# Patient Record
Sex: Male | Born: 1970 | Race: White | Hispanic: No | Marital: Married | Smoking: Former smoker
Health system: Southern US, Community
[De-identification: ages and names within clinical notes are randomized; demographics above are authoritative.]

## PROBLEM LIST (undated history)

## (undated) DIAGNOSIS — E669 Obesity, unspecified: Secondary | ICD-10-CM

## (undated) DIAGNOSIS — I10 Essential (primary) hypertension: Secondary | ICD-10-CM

## (undated) DIAGNOSIS — E785 Hyperlipidemia, unspecified: Secondary | ICD-10-CM

## (undated) DIAGNOSIS — T7840XA Allergy, unspecified, initial encounter: Secondary | ICD-10-CM

## (undated) DIAGNOSIS — M109 Gout, unspecified: Secondary | ICD-10-CM

## (undated) DIAGNOSIS — E119 Type 2 diabetes mellitus without complications: Secondary | ICD-10-CM

## (undated) DIAGNOSIS — Z9989 Dependence on other enabling machines and devices: Secondary | ICD-10-CM

## (undated) DIAGNOSIS — E559 Vitamin D deficiency, unspecified: Secondary | ICD-10-CM

## (undated) DIAGNOSIS — E291 Testicular hypofunction: Secondary | ICD-10-CM

## (undated) DIAGNOSIS — R824 Acetonuria: Secondary | ICD-10-CM

## (undated) DIAGNOSIS — G4733 Obstructive sleep apnea (adult) (pediatric): Secondary | ICD-10-CM

## (undated) DIAGNOSIS — L219 Seborrheic dermatitis, unspecified: Secondary | ICD-10-CM

## (undated) DIAGNOSIS — C6290 Malignant neoplasm of unspecified testis, unspecified whether descended or undescended: Secondary | ICD-10-CM

## (undated) DIAGNOSIS — E039 Hypothyroidism, unspecified: Secondary | ICD-10-CM

## (undated) DIAGNOSIS — IMO0002 Reserved for concepts with insufficient information to code with codable children: Secondary | ICD-10-CM

## (undated) DIAGNOSIS — E8881 Metabolic syndrome: Secondary | ICD-10-CM

## (undated) DIAGNOSIS — K449 Diaphragmatic hernia without obstruction or gangrene: Secondary | ICD-10-CM

## (undated) DIAGNOSIS — M5410 Radiculopathy, site unspecified: Secondary | ICD-10-CM

## (undated) DIAGNOSIS — F32A Depression, unspecified: Secondary | ICD-10-CM

## (undated) DIAGNOSIS — F419 Anxiety disorder, unspecified: Secondary | ICD-10-CM

## (undated) DIAGNOSIS — M1990 Unspecified osteoarthritis, unspecified site: Secondary | ICD-10-CM

## (undated) DIAGNOSIS — C801 Malignant (primary) neoplasm, unspecified: Secondary | ICD-10-CM

## (undated) HISTORY — DX: Vitamin D deficiency, unspecified: E55.9

## (undated) HISTORY — DX: Acetonuria: R82.4

## (undated) HISTORY — DX: Obesity, unspecified: E66.9

## (undated) HISTORY — DX: Hypothyroidism, unspecified: E03.9

## (undated) HISTORY — DX: Metabolic syndrome: E88.81

## (undated) HISTORY — DX: Dependence on other enabling machines and devices: Z99.89

## (undated) HISTORY — PX: BACK SURGERY: SHX140

## (undated) HISTORY — DX: Metabolic syndrome: E88.810

## (undated) HISTORY — DX: Radiculopathy, site unspecified: M54.10

## (undated) HISTORY — DX: Diaphragmatic hernia without obstruction or gangrene: K44.9

## (undated) HISTORY — DX: Gout, unspecified: M10.9

## (undated) HISTORY — DX: Obstructive sleep apnea (adult) (pediatric): G47.33

## (undated) HISTORY — PX: HERNIA REPAIR: SHX51

## (undated) HISTORY — DX: Reserved for concepts with insufficient information to code with codable children: IMO0002

## (undated) HISTORY — PX: SPINE SURGERY: SHX786

## (undated) HISTORY — DX: Seborrheic dermatitis, unspecified: L21.9

## (undated) HISTORY — DX: Testicular hypofunction: E29.1

## (undated) HISTORY — DX: Malignant (primary) neoplasm, unspecified: C80.1

## (undated) HISTORY — DX: Unspecified osteoarthritis, unspecified site: M19.90

## (undated) HISTORY — DX: Allergy, unspecified, initial encounter: T78.40XA

---

## 1998-12-23 HISTORY — PX: URETEROTOMY: SHX496

## 2002-09-27 ENCOUNTER — Encounter: Admission: RE | Admit: 2002-09-27 | Discharge: 2002-09-27 | Payer: Self-pay | Admitting: Neurosurgery

## 2002-09-27 ENCOUNTER — Encounter: Payer: Self-pay | Admitting: Neurosurgery

## 2006-03-05 ENCOUNTER — Ambulatory Visit: Payer: Self-pay | Admitting: Family Medicine

## 2007-03-15 ENCOUNTER — Emergency Department: Payer: Self-pay | Admitting: Emergency Medicine

## 2007-08-27 ENCOUNTER — Emergency Department: Payer: Self-pay | Admitting: Emergency Medicine

## 2007-08-27 ENCOUNTER — Other Ambulatory Visit: Payer: Self-pay

## 2007-09-01 ENCOUNTER — Emergency Department: Payer: Self-pay | Admitting: Emergency Medicine

## 2009-02-09 ENCOUNTER — Emergency Department: Payer: Self-pay | Admitting: Emergency Medicine

## 2009-07-04 ENCOUNTER — Emergency Department (HOSPITAL_COMMUNITY): Admission: EM | Admit: 2009-07-04 | Discharge: 2009-07-04 | Payer: Self-pay | Admitting: Emergency Medicine

## 2009-09-18 ENCOUNTER — Ambulatory Visit: Payer: Self-pay | Admitting: Pain Medicine

## 2009-12-25 ENCOUNTER — Observation Stay (HOSPITAL_COMMUNITY): Admission: EM | Admit: 2009-12-25 | Discharge: 2009-12-26 | Payer: Self-pay | Admitting: Emergency Medicine

## 2010-07-23 ENCOUNTER — Ambulatory Visit: Payer: Self-pay | Admitting: Family Medicine

## 2011-03-10 LAB — GLUCOSE, CAPILLARY
Glucose-Capillary: 276 mg/dL — ABNORMAL HIGH (ref 70–99)
Glucose-Capillary: 321 mg/dL — ABNORMAL HIGH (ref 70–99)
Glucose-Capillary: 343 mg/dL — ABNORMAL HIGH (ref 70–99)
Glucose-Capillary: 363 mg/dL — ABNORMAL HIGH (ref 70–99)
Glucose-Capillary: 386 mg/dL — ABNORMAL HIGH (ref 70–99)
Glucose-Capillary: 407 mg/dL — ABNORMAL HIGH (ref 70–99)
Glucose-Capillary: 560 mg/dL (ref 70–99)

## 2011-03-10 LAB — BASIC METABOLIC PANEL
BUN: 25 mg/dL — ABNORMAL HIGH (ref 6–23)
CO2: 26 mEq/L (ref 19–32)
Calcium: 9.8 mg/dL (ref 8.4–10.5)
Chloride: 96 mEq/L (ref 96–112)
Creatinine, Ser: 2.01 mg/dL — ABNORMAL HIGH (ref 0.4–1.5)
GFR calc Af Amer: 45 mL/min — ABNORMAL LOW (ref >60)
GFR calc non Af Amer: 38 mL/min — ABNORMAL LOW (ref >60)
Glucose, Bld: 590 mg/dL (ref 70–99)
Potassium: 4.8 mEq/L (ref 3.5–5.1)
Sodium: 133 mEq/L — ABNORMAL LOW (ref 135–145)

## 2011-03-10 LAB — DIFFERENTIAL
Basophils Absolute: 0.1 10*3/uL (ref 0.0–0.1)
Basophils Relative: 1 % (ref 0–1)
Eosinophils Absolute: 0.2 10*3/uL (ref 0.0–0.7)
Eosinophils Relative: 2 % (ref 0–5)
Lymphocytes Relative: 12 % (ref 12–46)
Lymphs Abs: 1.1 10*3/uL (ref 0.7–4.0)
Monocytes Absolute: 0.6 10*3/uL (ref 0.1–1.0)
Monocytes Relative: 7 % (ref 3–12)
Neutro Abs: 6.7 10*3/uL (ref 1.7–7.7)
Neutrophils Relative %: 77 % (ref 43–77)

## 2011-03-10 LAB — CBC
HCT: 41.5 % (ref 39.0–52.0)
Hemoglobin: 14.3 g/dL (ref 13.0–17.0)
MCHC: 34.4 g/dL (ref 30.0–36.0)
MCV: 88.4 fL (ref 78.0–100.0)
Platelets: 205 10*3/uL (ref 150–400)
RBC: 4.69 MIL/uL (ref 4.22–5.81)
RDW: 13 % (ref 11.5–15.5)
WBC: 8.7 10*3/uL (ref 4.0–10.5)

## 2011-03-10 LAB — URINALYSIS, ROUTINE W REFLEX MICROSCOPIC
Bilirubin Urine: NEGATIVE
Glucose, UA: >1000 mg/dL — AB
Hgb urine dipstick: NEGATIVE
Ketones, ur: NEGATIVE mg/dL
Leukocytes, UA: NEGATIVE
Nitrite: NEGATIVE
Protein, ur: NEGATIVE mg/dL
Specific Gravity, Urine: 1.03 (ref 1.005–1.030)
Urobilinogen, UA: 0.2 mg/dL (ref 0.0–1.0)
pH: 5 (ref 5.0–8.0)

## 2011-03-10 LAB — URINE MICROSCOPIC-ADD ON: Urine-Other: NONE SEEN

## 2011-12-26 DIAGNOSIS — E785 Hyperlipidemia, unspecified: Secondary | ICD-10-CM | POA: Diagnosis not present

## 2011-12-26 DIAGNOSIS — I10 Essential (primary) hypertension: Secondary | ICD-10-CM | POA: Diagnosis not present

## 2011-12-26 DIAGNOSIS — E039 Hypothyroidism, unspecified: Secondary | ICD-10-CM | POA: Diagnosis not present

## 2011-12-26 DIAGNOSIS — E559 Vitamin D deficiency, unspecified: Secondary | ICD-10-CM | POA: Diagnosis not present

## 2012-01-02 DIAGNOSIS — E119 Type 2 diabetes mellitus without complications: Secondary | ICD-10-CM | POA: Diagnosis not present

## 2012-01-02 DIAGNOSIS — G4733 Obstructive sleep apnea (adult) (pediatric): Secondary | ICD-10-CM | POA: Diagnosis not present

## 2012-01-02 DIAGNOSIS — IMO0001 Reserved for inherently not codable concepts without codable children: Secondary | ICD-10-CM | POA: Diagnosis not present

## 2012-01-02 DIAGNOSIS — G4730 Sleep apnea, unspecified: Secondary | ICD-10-CM | POA: Diagnosis not present

## 2012-01-02 DIAGNOSIS — E7800 Pure hypercholesterolemia, unspecified: Secondary | ICD-10-CM | POA: Diagnosis not present

## 2012-01-02 DIAGNOSIS — E669 Obesity, unspecified: Secondary | ICD-10-CM | POA: Diagnosis not present

## 2012-01-02 DIAGNOSIS — F54 Psychological and behavioral factors associated with disorders or diseases classified elsewhere: Secondary | ICD-10-CM | POA: Diagnosis not present

## 2012-01-02 DIAGNOSIS — Z79899 Other long term (current) drug therapy: Secondary | ICD-10-CM | POA: Diagnosis not present

## 2012-01-02 DIAGNOSIS — I10 Essential (primary) hypertension: Secondary | ICD-10-CM | POA: Diagnosis not present

## 2012-01-02 DIAGNOSIS — M159 Polyosteoarthritis, unspecified: Secondary | ICD-10-CM | POA: Diagnosis not present

## 2012-01-03 DIAGNOSIS — Z79899 Other long term (current) drug therapy: Secondary | ICD-10-CM | POA: Diagnosis not present

## 2012-01-03 DIAGNOSIS — G4733 Obstructive sleep apnea (adult) (pediatric): Secondary | ICD-10-CM | POA: Diagnosis not present

## 2012-01-03 DIAGNOSIS — E063 Autoimmune thyroiditis: Secondary | ICD-10-CM | POA: Diagnosis not present

## 2012-01-03 DIAGNOSIS — E669 Obesity, unspecified: Secondary | ICD-10-CM | POA: Diagnosis not present

## 2012-01-03 DIAGNOSIS — I10 Essential (primary) hypertension: Secondary | ICD-10-CM | POA: Diagnosis not present

## 2012-01-03 DIAGNOSIS — E119 Type 2 diabetes mellitus without complications: Secondary | ICD-10-CM | POA: Diagnosis not present

## 2012-01-03 DIAGNOSIS — IMO0001 Reserved for inherently not codable concepts without codable children: Secondary | ICD-10-CM | POA: Diagnosis not present

## 2012-01-03 DIAGNOSIS — E785 Hyperlipidemia, unspecified: Secondary | ICD-10-CM | POA: Diagnosis not present

## 2012-01-03 DIAGNOSIS — E7800 Pure hypercholesterolemia, unspecified: Secondary | ICD-10-CM | POA: Diagnosis not present

## 2012-01-10 DIAGNOSIS — Z9884 Bariatric surgery status: Secondary | ICD-10-CM | POA: Diagnosis not present

## 2012-01-10 DIAGNOSIS — Z713 Dietary counseling and surveillance: Secondary | ICD-10-CM | POA: Diagnosis not present

## 2012-01-10 DIAGNOSIS — K219 Gastro-esophageal reflux disease without esophagitis: Secondary | ICD-10-CM | POA: Diagnosis not present

## 2012-01-10 DIAGNOSIS — Z6841 Body Mass Index (BMI) 40.0 and over, adult: Secondary | ICD-10-CM | POA: Diagnosis not present

## 2012-01-10 DIAGNOSIS — I10 Essential (primary) hypertension: Secondary | ICD-10-CM | POA: Diagnosis not present

## 2012-01-10 DIAGNOSIS — E119 Type 2 diabetes mellitus without complications: Secondary | ICD-10-CM | POA: Diagnosis not present

## 2012-01-10 DIAGNOSIS — E669 Obesity, unspecified: Secondary | ICD-10-CM | POA: Diagnosis not present

## 2012-01-10 DIAGNOSIS — E785 Hyperlipidemia, unspecified: Secondary | ICD-10-CM | POA: Diagnosis not present

## 2012-01-10 DIAGNOSIS — E039 Hypothyroidism, unspecified: Secondary | ICD-10-CM | POA: Diagnosis not present

## 2012-01-10 DIAGNOSIS — M1990 Unspecified osteoarthritis, unspecified site: Secondary | ICD-10-CM | POA: Diagnosis not present

## 2012-01-20 DIAGNOSIS — E039 Hypothyroidism, unspecified: Secondary | ICD-10-CM | POA: Diagnosis not present

## 2012-01-20 DIAGNOSIS — E1165 Type 2 diabetes mellitus with hyperglycemia: Secondary | ICD-10-CM | POA: Diagnosis not present

## 2012-01-20 DIAGNOSIS — E669 Obesity, unspecified: Secondary | ICD-10-CM | POA: Diagnosis not present

## 2012-01-20 DIAGNOSIS — E1129 Type 2 diabetes mellitus with other diabetic kidney complication: Secondary | ICD-10-CM | POA: Diagnosis not present

## 2012-02-11 DIAGNOSIS — E119 Type 2 diabetes mellitus without complications: Secondary | ICD-10-CM | POA: Diagnosis not present

## 2012-02-11 DIAGNOSIS — Z01818 Encounter for other preprocedural examination: Secondary | ICD-10-CM | POA: Diagnosis not present

## 2012-02-11 DIAGNOSIS — G4730 Sleep apnea, unspecified: Secondary | ICD-10-CM | POA: Diagnosis not present

## 2012-02-11 DIAGNOSIS — I10 Essential (primary) hypertension: Secondary | ICD-10-CM | POA: Diagnosis not present

## 2012-02-11 DIAGNOSIS — E785 Hyperlipidemia, unspecified: Secondary | ICD-10-CM | POA: Diagnosis not present

## 2012-02-11 DIAGNOSIS — I119 Hypertensive heart disease without heart failure: Secondary | ICD-10-CM | POA: Diagnosis not present

## 2012-02-11 DIAGNOSIS — R918 Other nonspecific abnormal finding of lung field: Secondary | ICD-10-CM | POA: Diagnosis not present

## 2012-02-11 DIAGNOSIS — Z01810 Encounter for preprocedural cardiovascular examination: Secondary | ICD-10-CM | POA: Diagnosis not present

## 2012-02-13 DIAGNOSIS — E119 Type 2 diabetes mellitus without complications: Secondary | ICD-10-CM | POA: Diagnosis not present

## 2012-02-13 DIAGNOSIS — E1129 Type 2 diabetes mellitus with other diabetic kidney complication: Secondary | ICD-10-CM | POA: Diagnosis not present

## 2012-02-13 DIAGNOSIS — R7989 Other specified abnormal findings of blood chemistry: Secondary | ICD-10-CM | POA: Diagnosis not present

## 2012-02-13 DIAGNOSIS — Z79899 Other long term (current) drug therapy: Secondary | ICD-10-CM | POA: Diagnosis not present

## 2012-02-13 DIAGNOSIS — E669 Obesity, unspecified: Secondary | ICD-10-CM | POA: Diagnosis not present

## 2012-02-13 DIAGNOSIS — I10 Essential (primary) hypertension: Secondary | ICD-10-CM | POA: Diagnosis not present

## 2012-02-20 DIAGNOSIS — E119 Type 2 diabetes mellitus without complications: Secondary | ICD-10-CM | POA: Diagnosis present

## 2012-02-20 DIAGNOSIS — Z6841 Body Mass Index (BMI) 40.0 and over, adult: Secondary | ICD-10-CM | POA: Diagnosis not present

## 2012-02-20 DIAGNOSIS — G4733 Obstructive sleep apnea (adult) (pediatric): Secondary | ICD-10-CM | POA: Diagnosis not present

## 2012-02-20 DIAGNOSIS — I10 Essential (primary) hypertension: Secondary | ICD-10-CM | POA: Diagnosis not present

## 2012-02-20 DIAGNOSIS — E039 Hypothyroidism, unspecified: Secondary | ICD-10-CM | POA: Diagnosis present

## 2012-02-20 DIAGNOSIS — Z9884 Bariatric surgery status: Secondary | ICD-10-CM | POA: Insufficient documentation

## 2012-02-20 HISTORY — PX: ROUX-EN-Y GASTRIC BYPASS: SHX1104

## 2012-03-19 DIAGNOSIS — E1129 Type 2 diabetes mellitus with other diabetic kidney complication: Secondary | ICD-10-CM | POA: Diagnosis not present

## 2012-03-19 DIAGNOSIS — I10 Essential (primary) hypertension: Secondary | ICD-10-CM | POA: Diagnosis not present

## 2012-03-19 DIAGNOSIS — E1165 Type 2 diabetes mellitus with hyperglycemia: Secondary | ICD-10-CM | POA: Diagnosis not present

## 2012-03-19 DIAGNOSIS — M545 Low back pain, unspecified: Secondary | ICD-10-CM | POA: Diagnosis not present

## 2012-03-19 DIAGNOSIS — E039 Hypothyroidism, unspecified: Secondary | ICD-10-CM | POA: Diagnosis not present

## 2012-03-31 DIAGNOSIS — E039 Hypothyroidism, unspecified: Secondary | ICD-10-CM | POA: Diagnosis not present

## 2012-03-31 DIAGNOSIS — E1165 Type 2 diabetes mellitus with hyperglycemia: Secondary | ICD-10-CM | POA: Diagnosis not present

## 2012-03-31 DIAGNOSIS — E1129 Type 2 diabetes mellitus with other diabetic kidney complication: Secondary | ICD-10-CM | POA: Diagnosis not present

## 2012-05-25 DIAGNOSIS — Z9884 Bariatric surgery status: Secondary | ICD-10-CM | POA: Diagnosis not present

## 2012-05-25 DIAGNOSIS — Z6841 Body Mass Index (BMI) 40.0 and over, adult: Secondary | ICD-10-CM | POA: Diagnosis not present

## 2012-05-25 DIAGNOSIS — Z09 Encounter for follow-up examination after completed treatment for conditions other than malignant neoplasm: Secondary | ICD-10-CM | POA: Diagnosis not present

## 2012-05-25 DIAGNOSIS — Z713 Dietary counseling and surveillance: Secondary | ICD-10-CM | POA: Diagnosis not present

## 2012-07-17 DIAGNOSIS — N183 Chronic kidney disease, stage 3 unspecified: Secondary | ICD-10-CM | POA: Diagnosis not present

## 2012-07-17 DIAGNOSIS — E039 Hypothyroidism, unspecified: Secondary | ICD-10-CM | POA: Diagnosis not present

## 2012-07-17 DIAGNOSIS — E119 Type 2 diabetes mellitus without complications: Secondary | ICD-10-CM | POA: Diagnosis not present

## 2012-07-17 DIAGNOSIS — I10 Essential (primary) hypertension: Secondary | ICD-10-CM | POA: Diagnosis not present

## 2012-09-16 DIAGNOSIS — N183 Chronic kidney disease, stage 3 unspecified: Secondary | ICD-10-CM | POA: Diagnosis not present

## 2012-09-16 DIAGNOSIS — Z8639 Personal history of other endocrine, nutritional and metabolic disease: Secondary | ICD-10-CM | POA: Diagnosis not present

## 2012-09-16 DIAGNOSIS — Z23 Encounter for immunization: Secondary | ICD-10-CM | POA: Diagnosis not present

## 2012-09-16 DIAGNOSIS — E039 Hypothyroidism, unspecified: Secondary | ICD-10-CM | POA: Diagnosis not present

## 2012-09-16 DIAGNOSIS — Z862 Personal history of diseases of the blood and blood-forming organs and certain disorders involving the immune mechanism: Secondary | ICD-10-CM | POA: Diagnosis not present

## 2012-09-16 DIAGNOSIS — E559 Vitamin D deficiency, unspecified: Secondary | ICD-10-CM | POA: Diagnosis not present

## 2012-10-05 DIAGNOSIS — Z9884 Bariatric surgery status: Secondary | ICD-10-CM | POA: Diagnosis not present

## 2013-01-20 DIAGNOSIS — N183 Chronic kidney disease, stage 3 unspecified: Secondary | ICD-10-CM | POA: Diagnosis not present

## 2013-01-20 DIAGNOSIS — I10 Essential (primary) hypertension: Secondary | ICD-10-CM | POA: Diagnosis not present

## 2013-01-20 DIAGNOSIS — E039 Hypothyroidism, unspecified: Secondary | ICD-10-CM | POA: Diagnosis not present

## 2013-01-20 DIAGNOSIS — E785 Hyperlipidemia, unspecified: Secondary | ICD-10-CM | POA: Diagnosis not present

## 2013-01-20 DIAGNOSIS — IMO0002 Reserved for concepts with insufficient information to code with codable children: Secondary | ICD-10-CM | POA: Diagnosis not present

## 2013-02-10 ENCOUNTER — Ambulatory Visit: Payer: Self-pay | Admitting: Family Medicine

## 2013-02-10 DIAGNOSIS — G4733 Obstructive sleep apnea (adult) (pediatric): Secondary | ICD-10-CM | POA: Diagnosis not present

## 2013-03-30 DIAGNOSIS — E039 Hypothyroidism, unspecified: Secondary | ICD-10-CM | POA: Diagnosis not present

## 2013-03-30 DIAGNOSIS — L551 Sunburn of second degree: Secondary | ICD-10-CM | POA: Diagnosis not present

## 2013-04-20 DIAGNOSIS — M545 Low back pain, unspecified: Secondary | ICD-10-CM | POA: Diagnosis not present

## 2013-04-20 DIAGNOSIS — I10 Essential (primary) hypertension: Secondary | ICD-10-CM | POA: Diagnosis not present

## 2013-04-20 DIAGNOSIS — E039 Hypothyroidism, unspecified: Secondary | ICD-10-CM | POA: Diagnosis not present

## 2013-05-25 DIAGNOSIS — H5500 Unspecified nystagmus: Secondary | ICD-10-CM | POA: Diagnosis not present

## 2013-07-22 DIAGNOSIS — H6980 Other specified disorders of Eustachian tube, unspecified ear: Secondary | ICD-10-CM | POA: Diagnosis not present

## 2013-07-22 DIAGNOSIS — Z9884 Bariatric surgery status: Secondary | ICD-10-CM | POA: Diagnosis not present

## 2013-07-22 DIAGNOSIS — IMO0002 Reserved for concepts with insufficient information to code with codable children: Secondary | ICD-10-CM | POA: Diagnosis not present

## 2013-07-22 DIAGNOSIS — E039 Hypothyroidism, unspecified: Secondary | ICD-10-CM | POA: Diagnosis not present

## 2013-09-04 ENCOUNTER — Emergency Department: Payer: Self-pay | Admitting: Emergency Medicine

## 2013-09-04 DIAGNOSIS — Z79899 Other long term (current) drug therapy: Secondary | ICD-10-CM | POA: Diagnosis not present

## 2013-09-04 DIAGNOSIS — M79609 Pain in unspecified limb: Secondary | ICD-10-CM | POA: Diagnosis not present

## 2013-09-04 DIAGNOSIS — Z888 Allergy status to other drugs, medicaments and biological substances status: Secondary | ICD-10-CM | POA: Diagnosis not present

## 2013-09-04 DIAGNOSIS — E119 Type 2 diabetes mellitus without complications: Secondary | ICD-10-CM | POA: Diagnosis not present

## 2013-09-04 DIAGNOSIS — I10 Essential (primary) hypertension: Secondary | ICD-10-CM | POA: Diagnosis not present

## 2013-09-06 DIAGNOSIS — M79609 Pain in unspecified limb: Secondary | ICD-10-CM | POA: Diagnosis not present

## 2013-12-17 DIAGNOSIS — Z9884 Bariatric surgery status: Secondary | ICD-10-CM | POA: Diagnosis not present

## 2013-12-17 DIAGNOSIS — Z13220 Encounter for screening for lipoid disorders: Secondary | ICD-10-CM | POA: Diagnosis not present

## 2013-12-17 DIAGNOSIS — E559 Vitamin D deficiency, unspecified: Secondary | ICD-10-CM | POA: Diagnosis not present

## 2013-12-17 DIAGNOSIS — E039 Hypothyroidism, unspecified: Secondary | ICD-10-CM | POA: Diagnosis not present

## 2014-01-04 DIAGNOSIS — Z9884 Bariatric surgery status: Secondary | ICD-10-CM | POA: Diagnosis not present

## 2014-01-04 DIAGNOSIS — Z862 Personal history of diseases of the blood and blood-forming organs and certain disorders involving the immune mechanism: Secondary | ICD-10-CM | POA: Diagnosis not present

## 2014-01-04 DIAGNOSIS — Z13220 Encounter for screening for lipoid disorders: Secondary | ICD-10-CM | POA: Diagnosis not present

## 2014-01-04 DIAGNOSIS — Z79899 Other long term (current) drug therapy: Secondary | ICD-10-CM | POA: Diagnosis not present

## 2014-01-04 DIAGNOSIS — E559 Vitamin D deficiency, unspecified: Secondary | ICD-10-CM | POA: Diagnosis not present

## 2014-01-04 DIAGNOSIS — Z789 Other specified health status: Secondary | ICD-10-CM | POA: Diagnosis not present

## 2014-01-04 DIAGNOSIS — E039 Hypothyroidism, unspecified: Secondary | ICD-10-CM | POA: Diagnosis not present

## 2014-03-16 DIAGNOSIS — E039 Hypothyroidism, unspecified: Secondary | ICD-10-CM | POA: Diagnosis not present

## 2014-03-16 DIAGNOSIS — G4730 Sleep apnea, unspecified: Secondary | ICD-10-CM | POA: Diagnosis not present

## 2014-03-16 DIAGNOSIS — G8929 Other chronic pain: Secondary | ICD-10-CM | POA: Diagnosis not present

## 2014-03-16 DIAGNOSIS — Z9884 Bariatric surgery status: Secondary | ICD-10-CM | POA: Diagnosis not present

## 2014-07-11 DIAGNOSIS — T148 Other injury of unspecified body region: Secondary | ICD-10-CM | POA: Diagnosis not present

## 2014-07-11 DIAGNOSIS — A689 Relapsing fever, unspecified: Secondary | ICD-10-CM | POA: Diagnosis not present

## 2014-07-11 DIAGNOSIS — Z111 Encounter for screening for respiratory tuberculosis: Secondary | ICD-10-CM | POA: Diagnosis not present

## 2014-07-11 DIAGNOSIS — R21 Rash and other nonspecific skin eruption: Secondary | ICD-10-CM | POA: Diagnosis not present

## 2014-07-26 ENCOUNTER — Emergency Department: Payer: Self-pay | Admitting: Emergency Medicine

## 2014-07-26 DIAGNOSIS — F17200 Nicotine dependence, unspecified, uncomplicated: Secondary | ICD-10-CM | POA: Diagnosis not present

## 2014-07-26 DIAGNOSIS — S93409A Sprain of unspecified ligament of unspecified ankle, initial encounter: Secondary | ICD-10-CM | POA: Diagnosis not present

## 2014-07-26 DIAGNOSIS — S8990XA Unspecified injury of unspecified lower leg, initial encounter: Secondary | ICD-10-CM | POA: Diagnosis not present

## 2014-07-26 DIAGNOSIS — M109 Gout, unspecified: Secondary | ICD-10-CM | POA: Diagnosis not present

## 2014-07-26 DIAGNOSIS — I10 Essential (primary) hypertension: Secondary | ICD-10-CM | POA: Diagnosis not present

## 2014-07-26 DIAGNOSIS — S99919A Unspecified injury of unspecified ankle, initial encounter: Secondary | ICD-10-CM | POA: Diagnosis not present

## 2014-07-26 DIAGNOSIS — M7730 Calcaneal spur, unspecified foot: Secondary | ICD-10-CM | POA: Diagnosis not present

## 2014-07-26 DIAGNOSIS — E119 Type 2 diabetes mellitus without complications: Secondary | ICD-10-CM | POA: Diagnosis not present

## 2014-08-19 DIAGNOSIS — R21 Rash and other nonspecific skin eruption: Secondary | ICD-10-CM | POA: Diagnosis not present

## 2014-08-19 DIAGNOSIS — Z23 Encounter for immunization: Secondary | ICD-10-CM | POA: Diagnosis not present

## 2014-10-12 DIAGNOSIS — Z713 Dietary counseling and surveillance: Secondary | ICD-10-CM | POA: Diagnosis not present

## 2014-10-12 DIAGNOSIS — G8929 Other chronic pain: Secondary | ICD-10-CM | POA: Diagnosis not present

## 2014-10-12 DIAGNOSIS — E039 Hypothyroidism, unspecified: Secondary | ICD-10-CM | POA: Diagnosis not present

## 2014-10-12 DIAGNOSIS — M545 Low back pain: Secondary | ICD-10-CM | POA: Diagnosis not present

## 2014-10-12 DIAGNOSIS — M5489 Other dorsalgia: Secondary | ICD-10-CM | POA: Diagnosis not present

## 2014-10-12 DIAGNOSIS — E669 Obesity, unspecified: Secondary | ICD-10-CM | POA: Diagnosis not present

## 2015-01-05 DIAGNOSIS — R824 Acetonuria: Secondary | ICD-10-CM | POA: Diagnosis not present

## 2015-01-05 DIAGNOSIS — R109 Unspecified abdominal pain: Secondary | ICD-10-CM | POA: Diagnosis not present

## 2015-01-07 ENCOUNTER — Emergency Department: Payer: Self-pay | Admitting: Emergency Medicine

## 2015-01-07 DIAGNOSIS — L03114 Cellulitis of left upper limb: Secondary | ICD-10-CM | POA: Diagnosis not present

## 2015-01-07 DIAGNOSIS — E119 Type 2 diabetes mellitus without complications: Secondary | ICD-10-CM | POA: Diagnosis not present

## 2015-01-07 DIAGNOSIS — I10 Essential (primary) hypertension: Secondary | ICD-10-CM | POA: Diagnosis not present

## 2015-01-07 LAB — BASIC METABOLIC PANEL
Anion Gap: 7 (ref 7–16)
BUN: 16 mg/dL (ref 7–18)
Calcium, Total: 8.4 mg/dL — ABNORMAL LOW (ref 8.5–10.1)
Chloride: 107 mmol/L (ref 98–107)
Co2: 26 mmol/L (ref 21–32)
Creatinine: 1.12 mg/dL (ref 0.60–1.30)
EGFR (African American): >60
EGFR (Non-African Amer.): >60
Glucose: 111 mg/dL — ABNORMAL HIGH (ref 65–99)
Osmolality: 281 (ref 275–301)
Potassium: 4 mmol/L (ref 3.5–5.1)
Sodium: 140 mmol/L (ref 136–145)

## 2015-01-07 LAB — CBC
HCT: 40.6 % (ref 40.0–52.0)
HGB: 13 g/dL (ref 13.0–18.0)
MCH: 27.9 pg (ref 26.0–34.0)
MCHC: 32 g/dL (ref 32.0–36.0)
MCV: 87 fL (ref 80–100)
Platelet: 207 10*3/uL (ref 150–440)
RBC: 4.65 10*6/uL (ref 4.40–5.90)
RDW: 13.3 % (ref 11.5–14.5)
WBC: 12 10*3/uL — ABNORMAL HIGH (ref 3.8–10.6)

## 2015-01-10 ENCOUNTER — Ambulatory Visit: Payer: Self-pay | Admitting: Family Medicine

## 2015-01-10 DIAGNOSIS — E785 Hyperlipidemia, unspecified: Secondary | ICD-10-CM | POA: Diagnosis not present

## 2015-01-10 DIAGNOSIS — S3991XA Unspecified injury of abdomen, initial encounter: Secondary | ICD-10-CM | POA: Diagnosis not present

## 2015-01-10 DIAGNOSIS — Z9884 Bariatric surgery status: Secondary | ICD-10-CM | POA: Diagnosis not present

## 2015-01-10 DIAGNOSIS — G629 Polyneuropathy, unspecified: Secondary | ICD-10-CM | POA: Diagnosis not present

## 2015-01-10 DIAGNOSIS — Z8547 Personal history of malignant neoplasm of testis: Secondary | ICD-10-CM | POA: Diagnosis not present

## 2015-01-10 DIAGNOSIS — R1012 Left upper quadrant pain: Secondary | ICD-10-CM | POA: Diagnosis not present

## 2015-01-10 DIAGNOSIS — L03012 Cellulitis of left finger: Secondary | ICD-10-CM | POA: Diagnosis not present

## 2015-01-10 DIAGNOSIS — E039 Hypothyroidism, unspecified: Secondary | ICD-10-CM | POA: Diagnosis not present

## 2015-01-10 LAB — CBC WITH DIFFERENTIAL/PLATELET
Basophil #: 0.1 10*3/uL (ref 0.0–0.1)
Basophil %: 0.7 %
Eosinophil #: 0.2 10*3/uL (ref 0.0–0.7)
Eosinophil %: 3.1 %
HCT: 40 % (ref 40.0–52.0)
HGB: 12.8 g/dL — ABNORMAL LOW (ref 13.0–18.0)
Lymphocyte #: 1.1 10*3/uL (ref 1.0–3.6)
Lymphocyte %: 15 %
MCH: 27.9 pg (ref 26.0–34.0)
MCHC: 32 g/dL (ref 32.0–36.0)
MCV: 87 fL (ref 80–100)
Monocyte #: 0.6 x10 3/mm (ref 0.2–1.0)
Monocyte %: 8 %
Neutrophil #: 5.6 10*3/uL (ref 1.4–6.5)
Neutrophil %: 73.2 %
Platelet: 190 10*3/uL (ref 150–440)
RBC: 4.58 10*6/uL (ref 4.40–5.90)
RDW: 13.4 % (ref 11.5–14.5)
WBC: 7.6 10*3/uL (ref 3.8–10.6)

## 2015-01-10 LAB — BASIC METABOLIC PANEL
Anion Gap: 5 — ABNORMAL LOW (ref 7–16)
BUN: 10 mg/dL (ref 7–18)
Calcium, Total: 9 mg/dL (ref 8.5–10.1)
Chloride: 105 mmol/L (ref 98–107)
Co2: 31 mmol/L (ref 21–32)
Creatinine: 1.04 mg/dL (ref 0.60–1.30)
EGFR (African American): >60
EGFR (Non-African Amer.): >60
Glucose: 97 mg/dL (ref 65–99)
Osmolality: 280 (ref 275–301)
Potassium: 4.3 mmol/L (ref 3.5–5.1)
Sodium: 141 mmol/L (ref 136–145)

## 2015-01-11 ENCOUNTER — Inpatient Hospital Stay: Payer: Self-pay | Admitting: Internal Medicine

## 2015-01-11 DIAGNOSIS — Z8249 Family history of ischemic heart disease and other diseases of the circulatory system: Secondary | ICD-10-CM | POA: Diagnosis not present

## 2015-01-11 DIAGNOSIS — L03012 Cellulitis of left finger: Secondary | ICD-10-CM | POA: Diagnosis not present

## 2015-01-11 DIAGNOSIS — E663 Overweight: Secondary | ICD-10-CM | POA: Diagnosis present

## 2015-01-11 DIAGNOSIS — Z9884 Bariatric surgery status: Secondary | ICD-10-CM | POA: Diagnosis not present

## 2015-01-11 DIAGNOSIS — E785 Hyperlipidemia, unspecified: Secondary | ICD-10-CM | POA: Diagnosis not present

## 2015-01-11 DIAGNOSIS — E039 Hypothyroidism, unspecified: Secondary | ICD-10-CM | POA: Diagnosis not present

## 2015-01-11 DIAGNOSIS — Z885 Allergy status to narcotic agent status: Secondary | ICD-10-CM | POA: Diagnosis not present

## 2015-01-11 DIAGNOSIS — E119 Type 2 diabetes mellitus without complications: Secondary | ICD-10-CM | POA: Diagnosis present

## 2015-01-11 DIAGNOSIS — G629 Polyneuropathy, unspecified: Secondary | ICD-10-CM | POA: Diagnosis not present

## 2015-01-11 DIAGNOSIS — M545 Low back pain: Secondary | ICD-10-CM | POA: Diagnosis present

## 2015-01-11 DIAGNOSIS — G8929 Other chronic pain: Secondary | ICD-10-CM | POA: Diagnosis present

## 2015-01-11 DIAGNOSIS — Z833 Family history of diabetes mellitus: Secondary | ICD-10-CM | POA: Diagnosis not present

## 2015-01-11 DIAGNOSIS — L03119 Cellulitis of unspecified part of limb: Secondary | ICD-10-CM | POA: Diagnosis not present

## 2015-01-11 LAB — BASIC METABOLIC PANEL
Anion Gap: 4 — ABNORMAL LOW (ref 7–16)
BUN: 15 mg/dL (ref 7–18)
Calcium, Total: 8.2 mg/dL — ABNORMAL LOW (ref 8.5–10.1)
Chloride: 107 mmol/L (ref 98–107)
Co2: 33 mmol/L — ABNORMAL HIGH (ref 21–32)
Creatinine: 1.15 mg/dL (ref 0.60–1.30)
EGFR (African American): >60
EGFR (Non-African Amer.): >60
Glucose: 93 mg/dL (ref 65–99)
Osmolality: 287 (ref 275–301)
Potassium: 4.2 mmol/L (ref 3.5–5.1)
Sodium: 144 mmol/L (ref 136–145)

## 2015-01-11 LAB — CBC WITH DIFFERENTIAL/PLATELET
Basophil #: 0.1 10*3/uL (ref 0.0–0.1)
Basophil %: 1.1 %
Eosinophil #: 0.3 10*3/uL (ref 0.0–0.7)
Eosinophil %: 4.8 %
HCT: 37.6 % — ABNORMAL LOW (ref 40.0–52.0)
HGB: 11.9 g/dL — ABNORMAL LOW (ref 13.0–18.0)
Lymphocyte #: 1.3 10*3/uL (ref 1.0–3.6)
Lymphocyte %: 20.5 %
MCH: 27.8 pg (ref 26.0–34.0)
MCHC: 31.8 g/dL — ABNORMAL LOW (ref 32.0–36.0)
MCV: 88 fL (ref 80–100)
Monocyte #: 0.7 x10 3/mm (ref 0.2–1.0)
Monocyte %: 10.6 %
Neutrophil #: 3.9 10*3/uL (ref 1.4–6.5)
Neutrophil %: 63 %
Platelet: 174 10*3/uL (ref 150–440)
RBC: 4.29 10*6/uL — ABNORMAL LOW (ref 4.40–5.90)
RDW: 13.3 % (ref 11.5–14.5)
WBC: 6.1 10*3/uL (ref 3.8–10.6)

## 2015-01-11 LAB — LIPID PANEL
Cholesterol: 103 mg/dL (ref 0–200)
HDL Cholesterol: 26 mg/dL — ABNORMAL LOW (ref 40–60)
Ldl Cholesterol, Calc: 58 mg/dL (ref 0–100)
Triglycerides: 97 mg/dL (ref 0–200)
VLDL Cholesterol, Calc: 19 mg/dL (ref 5–40)

## 2015-01-12 LAB — CREATININE, SERUM
Creatinine: 1.16 mg/dL (ref 0.60–1.30)
EGFR (African American): >60
EGFR (Non-African Amer.): >60

## 2015-01-15 LAB — CULTURE, BLOOD (SINGLE)

## 2015-01-19 DIAGNOSIS — L03012 Cellulitis of left finger: Secondary | ICD-10-CM | POA: Diagnosis not present

## 2015-01-19 DIAGNOSIS — M79642 Pain in left hand: Secondary | ICD-10-CM | POA: Diagnosis not present

## 2015-01-23 DIAGNOSIS — G8929 Other chronic pain: Secondary | ICD-10-CM | POA: Diagnosis not present

## 2015-01-23 DIAGNOSIS — Z9229 Personal history of other drug therapy: Secondary | ICD-10-CM | POA: Diagnosis not present

## 2015-01-23 DIAGNOSIS — E039 Hypothyroidism, unspecified: Secondary | ICD-10-CM | POA: Diagnosis not present

## 2015-01-23 DIAGNOSIS — E559 Vitamin D deficiency, unspecified: Secondary | ICD-10-CM | POA: Diagnosis not present

## 2015-01-23 DIAGNOSIS — R109 Unspecified abdominal pain: Secondary | ICD-10-CM | POA: Diagnosis not present

## 2015-01-23 DIAGNOSIS — Z9884 Bariatric surgery status: Secondary | ICD-10-CM | POA: Diagnosis not present

## 2015-01-23 DIAGNOSIS — Z9189 Other specified personal risk factors, not elsewhere classified: Secondary | ICD-10-CM | POA: Diagnosis not present

## 2015-01-23 DIAGNOSIS — K449 Diaphragmatic hernia without obstruction or gangrene: Secondary | ICD-10-CM | POA: Diagnosis not present

## 2015-01-23 DIAGNOSIS — E785 Hyperlipidemia, unspecified: Secondary | ICD-10-CM | POA: Diagnosis not present

## 2015-02-09 DIAGNOSIS — Z9884 Bariatric surgery status: Secondary | ICD-10-CM | POA: Diagnosis not present

## 2015-02-09 DIAGNOSIS — E039 Hypothyroidism, unspecified: Secondary | ICD-10-CM | POA: Diagnosis not present

## 2015-02-09 DIAGNOSIS — Z9229 Personal history of other drug therapy: Secondary | ICD-10-CM | POA: Diagnosis not present

## 2015-02-09 DIAGNOSIS — E785 Hyperlipidemia, unspecified: Secondary | ICD-10-CM | POA: Diagnosis not present

## 2015-02-09 DIAGNOSIS — E559 Vitamin D deficiency, unspecified: Secondary | ICD-10-CM | POA: Diagnosis not present

## 2015-02-09 LAB — LIPID PANEL
Cholesterol: 132 mg/dL (ref 0–200)
HDL: 39 mg/dL (ref 35–70)
LDL Cholesterol: 78 mg/dL
Triglycerides: 73 mg/dL (ref 40–160)

## 2015-03-17 DIAGNOSIS — E039 Hypothyroidism, unspecified: Secondary | ICD-10-CM | POA: Diagnosis not present

## 2015-04-23 NOTE — Consult Note (Signed)
Brief Consult Note: Diagnosis: Left long finger cellulitis.   Patient was seen by consultant.   Consult note dictated.   Recommend further assessment or treatment.   Discussed with Attending MD.   Comments: Pt. appears to be responding well to present iv abx regimen, so I do not think he needs a formal I&D at this time.  I recommend that he continue to receive the iv abx for another 24 hours, then switch to appropriate po regimen.  I will recheck him in the AM.  Thanks!.  Electronic Signatures: Elenore Rota (MD)  (Signed 20-Jan-16 16:23)  Authored: Brief Consult Note   Last Updated: 20-Jan-16 16:23 by Elenore Rota (MD)

## 2015-04-23 NOTE — Consult Note (Signed)
PATIENT NAME:  [name], [name] DATE OF BIRTH:  [birth year]  DATE OF CONSULTATION:  01/11/2015  REQUESTING PHYSICIAN:  Aaron Mose. Hower, MD CONSULTING PHYSICIAN:  Pascal Lux, MD  REASON FOR CONSULTATION: I have been asked by Dr. Lavetta Nielsen to evaluate this pleasant 44 year old overweight male with a history of chronic lower back pain, hyperlipidemia, and hypothyroidism, who developed an apparent spider bite to the dorsoradial aspect of the left long proximal phalanx region 8 days ago. He recalls seeing a small whitehead, so he picked at it the following day and expressed a small amount of purulent material.  Over the next 72 hours, he developed significant swelling, pain, and erythema to the finger and extending into the hand to the point where he had difficulty removing his wedding band. He presented to the local Emergency Room where he was started on p.o. antibiotics, specifically, clindamycin at what appears to be 600 mg q.i.d., after being diagnosed with cellulitis. He followed up with his primary care physician yesterday afternoon, but was advised to return to the Emergency Room for persistent pain, swelling, and erythema. He was re-evaluated in the Emergency Room last evening and admitted for IV antibiotics.  Already, today, he notes significant improvement in his pain, swelling, and erythema.   PHYSICAL EXAMINATION:  GENERAL: We have a pleasant overweight middle-aged male resting comfortably in bed. He is alert and oriented x 3.  ORTHOPEDIC EXAMINATION: Limited to his left hand. There is an area of superficial skin necrosis measuring approximately 3 mm in diameter over the dorsal ulnar aspect of the long finger midway between the MCP and PIP joints. There is a surrounding erythema for approximately 1.5-2 cm diameter area. This area is swollen, but only minimally tender to firm palpation. There is no obvious abscess beneath the skin. Nothing could be expressed from the wound site. He  is able to actively flex and extend all joints of all digits of his left hand, especially the PIP and MCP joint of the left long finger. There is no tenderness volarly along the flexor sheaths. He is neurovascularly intact to all digits with good capillary refill and intact sensation to light touch.   IMPRESSION: Resolving dorsal left long finger and hand cellulitis secondary to an apparent spider bite.   PLAN: At this point, the patient appears to be responding well to the IV antibiotics. I would recommend that he continue to receive the IV antibiotics for another 24 hours before being  converted to an appropriate p.o. regimen. In addition, he is to keep his left hand elevated.   Thank you for asking me to participate in the care of this pleasant man. I will be happy to follow him with you.   ____________________________ J. Dorien Chihuahua, MD jjp:LT D: 01/11/2015 16:31:00 ET T: 01/11/2015 18:53:11 ET JOB#: [id number]  cc: Pascal Lux, MD, <Dictator> William Robby Sermon MD ELECTRONICALLY SIGNED 01/17/2015 9:16

## 2015-04-23 NOTE — H&P (Signed)
PATIENT NAME:  [name], [name] MR#:  [id number] DATE OF BIRTH:  [birth year]  DATE OF ADMISSION:  01/10/2015   72 year man history of chronic lumbago, hyperlipidemia unspecified, hypothyroidism, presenting with left hand pain. He suffered a spider bite about 1 week  with associated redness and swelling, described the pain as throbbing in quality, 6-7 out to 10 in intensity, worse with activity. No fevers, chills. Received some IV clindamycin as well as p.o. clindamycin. Despite that, condition has worsened, thus represented to the hospital for further work up and evaluation.  REVIEW OF SYSTEMS: CONSTITUTIONAL: Denies fever, chills, fatigue, weakness.  EYES: No blurred, double vision, eye pain. EARS, NOSE, AND THROAT: Denies tinnitus or hearing loss. RESPIRATORY: Denies cough or shortness of breath. CARDIOVASCULAR: Denies chest pain, palpitations, edema. GASTROINTESTINAL: Denies nausea, vomiting, diarrhea, abdominal pain. GENITOURINARY: Denies dysuria or hematuria. ENDOCRINOLOGY: Denies nocturia or thyroid problems. HEMATOLOGIC AND LYMPHATICS: Denies easy bruising, bleeding.  SKIN: Denies any rash or lesions other than erythematous swelling lesion over left finger.  MUSCULOSKELETAL: Painful left finger as described above. Denies pain in neck, back, shoulder, knees, hips or arthritic symptoms.  NEUROLOGIC: Denies paralysis or paresthesias.  PSYCHIATRIC: Denies anxiety or depressive symptoms.  Otherwise, full review of systems performed by me is negative.   PAST MEDICAL HISTORY: Hyperlipidemia, unspecified. Type 2 diabetes, on no further agents. Hypertension, essential. Hypothyroidism, unspecified. History of gastric bypass surgery.   SOCIAL HISTORY: Denies any alcohol, tobacco or drug usage.   FAMILY HISTORY: Positive for coronary artery disease as well as diabetes.   ALLERGIES: MORPHINE CAUSING ITCHING.   HOME MEDICATIONS: Unfortunately, he does not know his medication list at this  time, but does state he does take some hydromorphone, Synthroid as well as Lyrica. Per our documents, it appears he is on acetaminophen/oxycodone 325/10 p.o. q. 6 hours, aspirin 81 mg p.o. daily, Lyrica 100 mg p.o. 3 times daily, Lipitor 20 mg p.o. at bedtime, Synthroid 137 mcg p.o. daily; however, this is an incomplete list and we will get a better list from the family when the wife returns with his actual medications.   PHYSICAL EXAMINATION:  VITAL SIGNS: Temperature 97.9, heart rate 61, respirations 18, blood pressure 141/97, saturating 99% on room air. Weight 124.3 kg, BMI of 40.5.  GENERAL: Well-nourished, well-developed, Caucasian male not in acute distress.  HEAD: Normocephalic, atraumatic.  EYES: Pupils equal, round, reactive to the right. Extraocular muscles intact. No scleral icterus.  MOUTH: Moist mucosal membranes. Dentition intact. No abscess noted. EARS, NOSE, THROAT: Clear without exudates. No external lesions. NECK: Supple. No thyromegaly or nodules. No JVD.  PULMONARY: Clear to auscultation bilaterally without wheezes, rales, rhonchi. No use of accessory muscles. Good respiratory effort. CHEST: Nontender to palpation.  CARDIOVASCULAR: S1, S2, regular rate and rhythm. No murmurs or gallops. No edema. Pedal pulses 2+ bilaterally.  GASTROINTESTINAL: Soft, nontender, nondistended. No masses. Positive bowel sounds. No hepatosplenomegaly.  MUSCULOSKELETAL: Left third digit erythematous between the MCP and PIP. Lesion warm to touch, also tenderness to palpation. Range of motion full in all extremities.  NEUROLOGIC: Cranial nerves II-XII intact. No gross focal neurological deficits. Sensation intact. Reflexes intact.  SKIN: No ulcerations, lesions, rashes or cyanosis aside from the erythematous lesion between the MCP and PIP of left third digit as stated above. Skin warm, dry. Turgor intact. PSYCHIATRIC: Mood and affect within normal limits. The patient awake, alert, oriented x 3. Insight  and judgment intact.  LABORATORY DATA: Sodium 141, potassium 4.3, chloride 105, bicarb 31, BUN 10,  creatinine 1.04, glucose 97. WBC 7.6, hemoglobin 12.8, platelets of 190,000.   ASSESSMENT AND PLAN:  1.  A 44 year old Caucasian male with history of chronic lumbago, hyperlipidemia unspecified, hypothyroidism unspecified presenting with left hand pain, had a spider bite about a week ago. Despite IV clindamycin  and followed by a p.o. course of clindamycin, symptoms have worsened  left hand third digit. Received vancomycin and Zosyn in the Emergency Room. Continue with vancomycin, orthopedic consult, pain medications as required as well as we will initiate bowel regiment 2.  Hypothyroidism, unspecified. Continue with Synthroid.  3.  Hyperlipidemia. Continue with statin therapy.  4.  Neuropathy. Continue with Lyrica. 5.  Venous thromboembolism prophylaxis with heparin subcutaneous.  CODE STATUS: The patient is a full code.  TIME SPENT: Thirty-five minutes.   ____________________________ Aaron Mose. Braileigh Landenberger, MD dkh:TT D: 01/10/2015 20:46:00 ET T: 01/10/2015 21:46:46 ET JOB#: [id number]  cc: Aaron Mose. Emalea Mix, MD, <Dictator> Lizmarie Witters Woodfin Ganja MD ELECTRONICALLY SIGNED 01/11/2015 3:31

## 2015-04-23 NOTE — Consult Note (Signed)
Chief Complaint:  Subjective/Chief Complaint Left long finger cellulitis.  No new complaints re finger this AM.   VITAL SIGNS/ANCILLARY NOTES: **Vital Signs.:   21-Jan-16 05:36  Temperature Temperature (F) 97.7  Pulse Pulse 53  Respirations Respirations 20  Systolic BP Systolic BP 295  Diastolic BP (mmHg) Diastolic BP (mmHg) 73   Brief Assessment:  GEN well developed, well nourished, no acute distress   Additional Physical Exam Left hand - able to flex/extend all digits without pain, no drainage from wound site, approx. 1.5 - 1.8 cm diam. area of erythema and swelling around wound, minimal tenderness to firm palpation, NV intact to finger tip   Assessment/Plan:  Assessment/Plan:  Assessment Left long finger cellulitis secondary to spider bite   Plan Give 9 AM dose of Vancomycin, then convert to po abx.  I might suggest Bactrim DS 1 po BID as it usually has good Staph coverage, whereas Clindamycin has the reputation, to my recollection, of being bacteriostatic more than bacteriocidal.  He can start the Bactrim (or other po abx dose) this AM to overlap with the iv Vanco, then be d/c'd later today.  I will be happy to see him back in my office next week for a recheck.  Thanks!   Electronic Signatures: Elenore Rota (MD)  (Signed 21-Jan-16 07:36)  Authored: Chief Complaint, VITAL SIGNS/ANCILLARY NOTES, Brief Assessment, Assessment/Plan   Last Updated: 21-Jan-16 07:36 by Elenore Rota (MD)

## 2015-04-23 NOTE — Discharge Summary (Signed)
PATIENT NAME:  [name], [name] MR#:  [id number] DATE OF BIRTH:  [birth year]  DATE OF ADMISSION:  01/11/2015 DATE OF DISCHARGE:  01/12/2015  ADMITTING DIAGNOSES:  1.  Acute left middle finger cellulitis.  2.  Hypothyroidism.  3.  Hyperlipidemia.  4.  Neuropathy.   DISCHARGE DIAGNOSES:  1.  Acute cellulitis of left middle finger.  2.  Hypothyroidism.   3.  Hyperlipidemia.  4.  Neuropathy.   CONSULTATIONS: Orthopedics.   PROCEDURES: None.   BRIEF HISTORY AND PHYSICAL AND HOSPITAL COURSE: The patient is a 44 year old Caucasian male who came in to the ED with a chief complaint of left hand pain and swelling. The patient had a spider bite approximately 1 week ago following that he started noticing redness, swelling, and pain in the right hand. The patient has received p.o. clindamycin, but as his condition is getting worse, the patient was admitted to the hospital. The patient was given IV Zosyn and vancomycin and vancomycin was continued during the hospital course. Orthopedics was consulted and the patient was seen and followed by orthopedics.  With vancomycin his condition significantly improved; pain, swelling, and redness was resolved. The patient was able to flex and extend digits of left hand without any pain. No drainage from that wound site was noticed January 21. As his cellulitis was significantly improved, decision is made to discharge the patient with Bactrim p.o. b.i.d. for 1 week and to follow up with orthopedics as an outpatient in a week. Hospital course was uneventful.   DISCHARGE PHYSICAL EXAMINATION:  VITAL SIGNS: On January 21: Temperature 97.7, pulse 53, respirations 20, blood pressure 114/73, pulse oximetry was 95%.  GENERAL APPEARANCE: Not in acute distress. Moderately built and nourished.  HEENT: Normocephalic, atraumatic. Pupils are equal, react to light and accommodation. No scleral icterus. No conjunctival injection. No sinus tenderness. No postnasal drip. Moist mucous  membranes.  NECK: Supple. No JVD. No thyromegaly. Range of motion is intact.  LUNGS: Clear to auscultation bilaterally. No accessory muscle use.  CARDIAC: S1, S2 normal. Regular rate and rhythm. No murmur.  GASTROINTESTINAL: Soft. Bowel sounds are positive in all 4 quadrants. Nontender, nondistended. No hepatosplenomegaly. No masses. NEUROLOGIC: Awake, alert, oriented x 3. Cranial nerves II-XII are grossly intact. Motor and sensory are intact. Reflexes are 2+.  EXTREMITIES: Left hand left middle finger erythema is significantly resolved. Edema is  is resolved. Range of motion of all finger joints are intact. Pulses are 2+. PSYCHIATRIC: Normal mood and affect.   MEDICATIONS AT THE TIME OF DISCHARGE: Lyrica 100 mg 1 capsule p.o. 2-3 times a day; Synthroid 137 mcg p.o. once daily; Tylenol with hydrocodone 325/7.5 mg p.o. 2-3 times a day as needed for pain; Allegra 180 mg p.o. once daily; Bactrim DS 1 tablet p.o. q. 12 hours for 7 days.   FOLLOWUP APPOINTMENTS: With primary care physician in 1-2 weeks; orthopedics as recommended by them in a week.   DIET: Low-fat, regular consistency.   ACTIVITY: As tolerated.  LABORATORY AND IMAGING STUDIES: The patient's BMP is normal except anion gap at 4. WBC 6.1, hemoglobin is 11.9, hematocrit 37.6, platelets are 174,000. Blood culture no growth in 48 hours x 2.   The diagnosis and plan of care were discussed in detail with the patient. He verbalized understanding of the plan.   TOTAL TIME SPENT ON DISCHARGE AND COORDINATION OF CARE: 45 minutes.    ____________________________ Nicholes Mango, MD ag:bm D: 01/13/2015 19:19:31 ET T: 01/14/2015 01:44:14 ET JOB#: [id number]  cc: Nicholes Mango,  MD, <Dictator> Lawana Chambers, MD Primary Care Physician Nicholes Mango MD ELECTRONICALLY SIGNED 01/14/2015 16:54

## 2015-04-27 DIAGNOSIS — M5489 Other dorsalgia: Secondary | ICD-10-CM | POA: Diagnosis not present

## 2015-04-27 DIAGNOSIS — M545 Low back pain: Secondary | ICD-10-CM | POA: Diagnosis not present

## 2015-04-27 DIAGNOSIS — E8881 Metabolic syndrome: Secondary | ICD-10-CM | POA: Diagnosis not present

## 2015-04-27 DIAGNOSIS — E669 Obesity, unspecified: Secondary | ICD-10-CM | POA: Diagnosis not present

## 2015-04-27 DIAGNOSIS — M5410 Radiculopathy, site unspecified: Secondary | ICD-10-CM | POA: Diagnosis not present

## 2015-04-27 DIAGNOSIS — G8929 Other chronic pain: Secondary | ICD-10-CM | POA: Diagnosis not present

## 2015-04-27 DIAGNOSIS — G4733 Obstructive sleep apnea (adult) (pediatric): Secondary | ICD-10-CM | POA: Diagnosis not present

## 2015-04-27 DIAGNOSIS — J309 Allergic rhinitis, unspecified: Secondary | ICD-10-CM | POA: Diagnosis not present

## 2015-04-27 DIAGNOSIS — E039 Hypothyroidism, unspecified: Secondary | ICD-10-CM | POA: Diagnosis not present

## 2015-04-27 DIAGNOSIS — Z9884 Bariatric surgery status: Secondary | ICD-10-CM | POA: Diagnosis not present

## 2015-06-29 ENCOUNTER — Ambulatory Visit (INDEPENDENT_AMBULATORY_CARE_PROVIDER_SITE_OTHER): Payer: Medicare Other | Admitting: Family Medicine

## 2015-06-29 ENCOUNTER — Other Ambulatory Visit: Payer: Self-pay | Admitting: Family Medicine

## 2015-06-29 ENCOUNTER — Encounter (INDEPENDENT_AMBULATORY_CARE_PROVIDER_SITE_OTHER): Payer: Self-pay

## 2015-06-29 ENCOUNTER — Encounter: Payer: Self-pay | Admitting: Family Medicine

## 2015-06-29 VITALS — BP 142/96 | HR 73 | Temp 98.3°F | Resp 18 | Ht 72.0 in | Wt 280.5 lb

## 2015-06-29 DIAGNOSIS — J309 Allergic rhinitis, unspecified: Secondary | ICD-10-CM | POA: Insufficient documentation

## 2015-06-29 DIAGNOSIS — E039 Hypothyroidism, unspecified: Secondary | ICD-10-CM | POA: Insufficient documentation

## 2015-06-29 DIAGNOSIS — Z8547 Personal history of malignant neoplasm of testis: Secondary | ICD-10-CM | POA: Insufficient documentation

## 2015-06-29 DIAGNOSIS — N5089 Other specified disorders of the male genital organs: Secondary | ICD-10-CM

## 2015-06-29 DIAGNOSIS — L821 Other seborrheic keratosis: Secondary | ICD-10-CM | POA: Insufficient documentation

## 2015-06-29 DIAGNOSIS — K449 Diaphragmatic hernia without obstruction or gangrene: Secondary | ICD-10-CM | POA: Insufficient documentation

## 2015-06-29 DIAGNOSIS — G629 Polyneuropathy, unspecified: Secondary | ICD-10-CM | POA: Insufficient documentation

## 2015-06-29 DIAGNOSIS — E8881 Metabolic syndrome: Secondary | ICD-10-CM | POA: Insufficient documentation

## 2015-06-29 DIAGNOSIS — G8929 Other chronic pain: Secondary | ICD-10-CM | POA: Insufficient documentation

## 2015-06-29 DIAGNOSIS — E785 Hyperlipidemia, unspecified: Secondary | ICD-10-CM | POA: Insufficient documentation

## 2015-06-29 DIAGNOSIS — E559 Vitamin D deficiency, unspecified: Secondary | ICD-10-CM | POA: Insufficient documentation

## 2015-06-29 DIAGNOSIS — M549 Dorsalgia, unspecified: Secondary | ICD-10-CM

## 2015-06-29 DIAGNOSIS — G5600 Carpal tunnel syndrome, unspecified upper limb: Secondary | ICD-10-CM | POA: Insufficient documentation

## 2015-06-29 DIAGNOSIS — Z8639 Personal history of other endocrine, nutritional and metabolic disease: Secondary | ICD-10-CM | POA: Insufficient documentation

## 2015-06-29 DIAGNOSIS — F40240 Claustrophobia: Secondary | ICD-10-CM | POA: Insufficient documentation

## 2015-06-29 DIAGNOSIS — M1990 Unspecified osteoarthritis, unspecified site: Secondary | ICD-10-CM | POA: Insufficient documentation

## 2015-06-29 DIAGNOSIS — G4733 Obstructive sleep apnea (adult) (pediatric): Secondary | ICD-10-CM | POA: Insufficient documentation

## 2015-06-29 DIAGNOSIS — N508 Other specified disorders of male genital organs: Secondary | ICD-10-CM

## 2015-06-29 DIAGNOSIS — M21372 Foot drop, left foot: Secondary | ICD-10-CM | POA: Insufficient documentation

## 2015-06-29 NOTE — Progress Notes (Signed)
Name: [name]   MRN: [id number]    DOB: [birth year]   Date:06/29/2015       Progress Note  Subjective  Chief Complaint  Chief Complaint  Patient presents with  . Testicular Mass    Onset week ago,swollen,size of dime on Left testicular    HPI  Scrotum mass: patient found a dime size mass on right scrotum about one week ago. No pain, no discomfort, no dysuria. No hematospermia. No fever, no chills. He has a personal history of testicular cancer and is very concerned about it.   Patient Active Problem List   Diagnosis Date Noted  . Back ache 06/29/2015  . Chronic nonmalignant pain 06/29/2015  . Carpal tunnel syndrome 06/29/2015  . Osteoarthritis 06/29/2015  . Claustrophobia 06/29/2015  . Dyslipidemia 06/29/2015  . Foot drop, left 06/29/2015  . Hiatal hernia 06/29/2015  . H/O diabetes mellitus 06/29/2015  . H/O testicular cancer 06/29/2015  . Acquired hypothyroidism 06/29/2015  . Dysmetabolic syndrome 82/50/0370  . Adult BMI 30+ 06/29/2015  . Obstructive sleep apnea of adult 06/29/2015  . Allergic rhinitis 06/29/2015  . Nerve root pain 06/29/2015  . Seborrheic keratoses 06/29/2015  . Vitamin D deficiency 06/29/2015  . Bariatric surgery status 02/20/2012    History  Substance Use Topics  . Smoking status: Former Smoker -- 3.00 packs/day for 8 years    Types: Cigarettes, Cigars, Pipe    Start date: 06/28/1984    Quit date: 12/24/1991  . Smokeless tobacco: Former Systems developer    Types: [place] date: 12/24/1991  . Alcohol Use: No     Current outpatient prescriptions:  .  diclofenac sodium (VOLTAREN) 1 % GEL, Place onto the skin., Disp: , Rfl:  .  fexofenadine (ALLEGRA ALLERGY) 180 MG tablet, Take 1 tablet by mouth daily., Disp: , Rfl:  .  HYDROcodone-acetaminophen (NORCO) 7.5-325 MG per tablet, Take 1 tablet by mouth 3 (three) times daily as needed., Disp: , Rfl:  .  levothyroxine (SYNTHROID) 137 MCG tablet, Take 1 tablet by mouth daily., Disp: , Rfl:  .   pregabalin (LYRICA) 150 MG capsule, Take 1 capsule by mouth 3 (three) times daily., Disp: , Rfl:   Allergies  Allergen Reactions  . Morphine Itching    ROS  Ten systems reviewed and is negative except as mentioned in HPI and also chronic back pain, left foot drop, joint pains  Objective  Filed Vitals:   06/29/15 1555  BP: 142/96  Pulse: 73  Temp: 98.3 F (36.8 C)  TempSrc: Oral  Resp: 18  Height: 6' (1.829 m)  Weight: 280 lb 8 oz (127.234 kg)  SpO2: 98%    Body mass index is 38.03 kg/(m^2).    Physical Exam  Constitutional: Patient appears well-developed and well-nourished. No distress. Obese Eyes: Nystagmus  Neck: Normal range of motion. Neck supple. Cardiovascular: Normal rate, regular rhythm and normal heart sounds.  No murmur heard. No BLE edema. Pulmonary/Chest: Effort normal and breath sounds normal. No respiratory distress. Abdominal: Soft.  There is no tenderness. Psychiatric: Patient has a normal mood and affect. behavior is normal. Judgment and thought content normal. Genitalia: he has a dime size mass, non-tender on right scrotum, seems to be on the wall. Did not feel the remaining testicle    Assessment & Plan  1. Mass, scrotum Check [place], seems to be subcutaneous, possible cyst. Check [place] , previous history of testicular cancer - US Scrotum; Future

## 2015-06-30 ENCOUNTER — Ambulatory Visit
Admission: RE | Admit: 2015-06-30 | Discharge: 2015-06-30 | Disposition: A | Discharge status: Home / Self Care | Payer: Medicare Other | Source: Ambulatory Visit | Attending: Family Medicine | Admitting: Family Medicine

## 2015-06-30 DIAGNOSIS — N5089 Other specified disorders of the male genital organs: Secondary | ICD-10-CM

## 2015-06-30 DIAGNOSIS — N503 Cyst of epididymis: Secondary | ICD-10-CM | POA: Diagnosis not present

## 2015-06-30 DIAGNOSIS — N508 Other specified disorders of male genital organs: Secondary | ICD-10-CM | POA: Diagnosis not present

## 2015-06-30 DIAGNOSIS — Z9079 Acquired absence of other genital organ(s): Secondary | ICD-10-CM | POA: Diagnosis not present

## 2015-07-02 ENCOUNTER — Other Ambulatory Visit: Payer: Self-pay | Admitting: Family Medicine

## 2015-07-02 DIAGNOSIS — Z9884 Bariatric surgery status: Secondary | ICD-10-CM

## 2015-07-02 DIAGNOSIS — D649 Anemia, unspecified: Secondary | ICD-10-CM

## 2015-07-02 DIAGNOSIS — E611 Iron deficiency: Secondary | ICD-10-CM

## 2015-07-02 DIAGNOSIS — R1013 Epigastric pain: Secondary | ICD-10-CM

## 2015-07-02 DIAGNOSIS — Z8547 Personal history of malignant neoplasm of testis: Secondary | ICD-10-CM

## 2015-07-02 MED ORDER — CIPROFLOXACIN HCL 500 MG PO TABS: 500.0000 mg | ORAL_TABLET | Freq: Two times a day (BID) | ORAL | Status: DC

## 2015-08-01 ENCOUNTER — Encounter: Payer: Self-pay | Admitting: Family Medicine

## 2015-08-01 ENCOUNTER — Ambulatory Visit (INDEPENDENT_AMBULATORY_CARE_PROVIDER_SITE_OTHER): Payer: Medicare Other | Admitting: Family Medicine

## 2015-08-01 VITALS — BP 138/90 | HR 74 | Temp 98.7°F | Resp 16 | Ht 72.0 in | Wt 283.7 lb

## 2015-08-01 DIAGNOSIS — E039 Hypothyroidism, unspecified: Secondary | ICD-10-CM

## 2015-08-01 DIAGNOSIS — H5509 Other forms of nystagmus: Secondary | ICD-10-CM | POA: Insufficient documentation

## 2015-08-01 DIAGNOSIS — M8949 Other hypertrophic osteoarthropathy, multiple sites: Secondary | ICD-10-CM

## 2015-08-01 DIAGNOSIS — M150 Primary generalized (osteo)arthritis: Secondary | ICD-10-CM | POA: Diagnosis not present

## 2015-08-01 DIAGNOSIS — G629 Polyneuropathy, unspecified: Secondary | ICD-10-CM

## 2015-08-01 DIAGNOSIS — M159 Polyosteoarthritis, unspecified: Secondary | ICD-10-CM

## 2015-08-01 DIAGNOSIS — G8929 Other chronic pain: Secondary | ICD-10-CM

## 2015-08-01 DIAGNOSIS — H5500 Unspecified nystagmus: Secondary | ICD-10-CM | POA: Insufficient documentation

## 2015-08-01 MED ORDER — HYDROCODONE-ACETAMINOPHEN 7.5-325 MG PO TABS: 1.0000 | ORAL_TABLET | Freq: Four times a day (QID) | ORAL | Status: DC | PRN

## 2015-08-01 MED ORDER — LEVOTHYROXINE SODIUM 137 MCG PO TABS: 137.0000 ug | ORAL_TABLET | Freq: Every day | ORAL | Status: DC

## 2015-08-01 MED ORDER — PREGABALIN 150 MG PO CAPS: 150.0000 mg | ORAL_CAPSULE | Freq: Three times a day (TID) | ORAL | Status: DC

## 2015-08-01 MED ORDER — HYDROCODONE-ACETAMINOPHEN 7.5-325 MG PO TABS: 1.0000 | ORAL_TABLET | Freq: Three times a day (TID) | ORAL | Status: DC | PRN

## 2015-08-01 MED ORDER — PREGABALIN 225 MG PO CAPS: 225.0000 mg | ORAL_CAPSULE | Freq: Three times a day (TID) | ORAL | Status: DC

## 2015-08-01 NOTE — Progress Notes (Signed)
Name: [name]   MRN: [id number]    DOB: Apr 04, 1971   Date:08/01/2015       Progress Note  Subjective  Chief Complaint  Chief Complaint  Patient presents with  . Hypothyroidism  . Pain    Back    HPI  Hypothyroidism: taking same dose of Synthroid for many months. He has chronic dry skin, no hair loss, no constipation. He is compliant with his medication  Chronic low back pain : he is taking Norco 7.5/325mg  during the winter he needs to take more often but at this time, pain is controlled with at most three pills daily, he denies misuse or selling.   OA: he still has bilateral knee pain, uses Voltaren topically and also takes hydrodocodone  Neuropathy: he has bilateral daily pain on both legs, aching like, he denies burning or prickly sensation, Lyrica usually controls symptoms but it was severe last night, He states that symptoms are worse when it rains.   Patient Active Problem List   Diagnosis Date Noted  . Chronic back pain 06/29/2015  . Chronic nonmalignant pain 06/29/2015  . Carpal tunnel syndrome 06/29/2015  . Osteoarthritis 06/29/2015  . Claustrophobia 06/29/2015  . Foot drop, left 06/29/2015  . Hiatal hernia 06/29/2015  . H/O diabetes mellitus 06/29/2015  . H/O testicular cancer 06/29/2015  . Acquired hypothyroidism 06/29/2015  . Dysmetabolic syndrome 16/96/7893  . Adult BMI 30+ 06/29/2015  . Allergic rhinitis 06/29/2015  . Neuropathy 06/29/2015  . Seborrheic keratoses 06/29/2015  . Vitamin D deficiency 06/29/2015  . Bariatric surgery status 02/20/2012    Past Surgical History  Procedure Laterality Date  . 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 Back surgery      X 2  . Hernia repair      left side and umbilical hernia repair  . Ureterotomy Left 2000    Family History  Problem Relation Age of Onset  . Coronary artery disease Mother   . Cancer Father     Melanoma and Testicular  . Diabetes Father     History   Social History  . Marital  Status: Married    Spouse Name: N/A  . Number of Children: N/A  . Years of Education: N/A   Occupational History  . Not on file.   Social History Main Topics  . Smoking status: Former Smoker -- 3.00 packs/day for 8 years    Types: Cigarettes, Cigars, Pipe    Start date: 06/28/1984    Quit date: 12/24/1991  . Smokeless tobacco: Former Systems developer    Types: [place] date: 12/24/1991  . Alcohol Use: No  . Drug Use: No  . Sexual Activity:    Partners: Female   Other Topics Concern  . Not on file   Social History Narrative     Current outpatient prescriptions:  .  diclofenac sodium (VOLTAREN) 1 % GEL, Place onto the skin., Disp: , Rfl:  .  fexofenadine (ALLEGRA ALLERGY) 180 MG tablet, Take 1 tablet by mouth daily., Disp: , Rfl:  .  HYDROcodone-acetaminophen (NORCO) 7.5-325 MG per tablet, Take 1 tablet by mouth 3 (three) times daily as needed., Disp: 90 tablet, Rfl: 0 .  HYDROcodone-acetaminophen (NORCO) 7.5-325 MG per tablet, Take 1 tablet by mouth every 6 (six) hours as needed for moderate pain., Disp: 90 tablet, Rfl: 0 .  HYDROcodone-acetaminophen (NORCO) 7.5-325 MG per tablet, Take 1 tablet by mouth every 6 (six) hours as needed for moderate pain., Disp: 90 tablet, Rfl: 0 .  levothyroxine (SYNTHROID) 137 MCG tablet, Take 1 tablet (137 mcg total) by mouth daily., Disp: 30 tablet, Rfl: 5 .  pregabalin (LYRICA) 150 MG capsule, Take 1 capsule (150 mg total) by mouth 3 (three) times daily., Disp: 90 capsule, Rfl: 5  Allergies  Allergen Reactions  . Morphine Itching     ROS  Constitutional: Negative for fever or weight change.  Respiratory: Negative for cough and shortness of breath.   Cardiovascular: Negative for chest pain or palpitations.  Gastrointestinal: Negative for abdominal pain, no bowel changes.  Musculoskeletal: Positive  for gait problem, no  joint swelling.  Skin: Negative for rash.  Neurological: Negative for dizziness or headache.  No other specific complaints  in a complete review of systems (except as listed in HPI above).  Objective  Filed Vitals:   08/01/15 1613  BP: 138/90  Pulse: 74  Temp: 98.7 F (37.1 C)  TempSrc: Oral  Resp: 16  Height: 6' (1.829 m)  Weight: 283 lb 11.2 oz (128.685 kg)  SpO2: 98%    Body mass index is 38.47 kg/(m^2).  Physical Exam  Constitutional: Patient appears well-developed and well-nourished. Obese  No distress.  HEENT: head atraumatic, normocephalic, pupils equal and reactive to light, nystagmus is lateral neck supple, throat within normal limits Cardiovascular: Normal rate, regular rhythm and normal heart sounds.  No murmur heard. No BLE edema. Pulmonary/Chest: Effort normal and breath sounds normal. No respiratory distress. Abdominal: Soft.  There is no tenderness. Psychiatric: Patient has a normal mood and affect. behavior is normal. Judgment and thought content normal. Muscular Skeletal: pain during palpation of lumbar spine,neg straight leg raise, crepitus with extension of both knees    PHQ2/9: Depression screen PHQ 2/9 06/29/2015  Decreased Interest 0  Down, Depressed, Hopeless 0  PHQ - 2 Score 0    Fall Risk: Fall Risk  06/29/2015  Falls in the past year? No     Assessment & Plan  1. Acquired hypothyroidism  - levothyroxine (SYNTHROID) 137 MCG tablet; Take 1 tablet (137 mcg total) by mouth daily.  Dispense: 30 tablet; Refill: 5  2. Chronic nonmalignant pain Pain contract is up to date, continue medication  - HYDROcodone-acetaminophen (NORCO) 7.5-325 MG per tablet; Take 1 tablet by mouth 3 (three) times daily as needed.  Dispense: 90 tablet; Refill: 0 - HYDROcodone-acetaminophen (NORCO) 7.5-325 MG per tablet; Take 1 tablet by mouth every 6 (six) hours as needed for moderate pain.  Dispense: 90 tablet; Refill: 0 - HYDROcodone-acetaminophen (NORCO) 7.5-325 MG per tablet; Take 1 tablet by mouth every 6 (six) hours as needed for moderate pain.  Dispense: 90 tablet; Refill: 0  3.  Neuropathy Having more pain, we will increase dose to 225mg  - pregabalin (LYRICA) 225 MG capsule; Take 1 capsule (225 mg total) by mouth 3 (three) times daily.  Dispense: 90 capsule; Refill: 5  4. Primary osteoarthritis involving multiple joints Continue medication

## 2015-11-02 ENCOUNTER — Ambulatory Visit (INDEPENDENT_AMBULATORY_CARE_PROVIDER_SITE_OTHER): Payer: Medicare Other | Admitting: Family Medicine

## 2015-11-02 ENCOUNTER — Encounter: Payer: Self-pay | Admitting: Family Medicine

## 2015-11-02 ENCOUNTER — Telehealth: Payer: Self-pay

## 2015-11-02 VITALS — BP 140/92 | HR 67 | Temp 98.5°F | Resp 16 | Ht 69.0 in | Wt 294.8 lb

## 2015-11-02 DIAGNOSIS — G629 Polyneuropathy, unspecified: Secondary | ICD-10-CM

## 2015-11-02 DIAGNOSIS — I10 Essential (primary) hypertension: Secondary | ICD-10-CM | POA: Diagnosis not present

## 2015-11-02 DIAGNOSIS — Z79899 Other long term (current) drug therapy: Secondary | ICD-10-CM

## 2015-11-02 DIAGNOSIS — Z8639 Personal history of other endocrine, nutritional and metabolic disease: Secondary | ICD-10-CM | POA: Diagnosis not present

## 2015-11-02 DIAGNOSIS — E559 Vitamin D deficiency, unspecified: Secondary | ICD-10-CM

## 2015-11-02 DIAGNOSIS — E785 Hyperlipidemia, unspecified: Secondary | ICD-10-CM

## 2015-11-02 DIAGNOSIS — M549 Dorsalgia, unspecified: Secondary | ICD-10-CM

## 2015-11-02 DIAGNOSIS — E039 Hypothyroidism, unspecified: Secondary | ICD-10-CM

## 2015-11-02 DIAGNOSIS — Z23 Encounter for immunization: Secondary | ICD-10-CM | POA: Diagnosis not present

## 2015-11-02 DIAGNOSIS — Z9884 Bariatric surgery status: Secondary | ICD-10-CM

## 2015-11-02 DIAGNOSIS — G8929 Other chronic pain: Secondary | ICD-10-CM

## 2015-11-02 MED ORDER — HYDROCODONE-ACETAMINOPHEN 7.5-325 MG PO TABS: 1.0000 | ORAL_TABLET | Freq: Four times a day (QID) | ORAL | Status: DC | PRN

## 2015-11-02 NOTE — Progress Notes (Signed)
Name: [name]   MRN: [id number]    DOB: 07/06/1971   Date:11/02/2015       Progress Note  Subjective  Chief Complaint  Chief Complaint  Patient presents with  . Medication Refill    3 month F/U  . Back Pain    Unchanged  . Hypothyroidism    Dry Skin, unchanged    HPI  Hypothyroidism: taking same dose of Synthroid for many months. He has chronic dry skin, no hair loss, no constipation. He is compliant with his medication. Due for labs  Chronic low back pain : he is taking Norco 7.5/325mg  during the winter he needs to take more often but at this time, pain is controlled with at most three pills daily, he denies misuse or selling. Since the weather has gotten colder he is having more pain. He is working outdoors. No longer under disability and will lose Medicare at the end of this year, but will have insurance coverage through his job  OA: he still has bilateral knee pain, uses Voltaren topically and also takes hydrodocodone  Neuropathy: he has bilateral daily pain on both legs, aching like, he denies burning or prickly sensation, Lyrica usually controls symptoms. Sleeping well at this time, pain occasionally keeps him up at night  Patient Active Problem List   Diagnosis Date Noted  . Dyslipidemia 11/02/2015  . Acquired nystagmus 08/01/2015  . Chronic back pain 06/29/2015  . Carpal tunnel syndrome 06/29/2015  . Osteoarthritis 06/29/2015  . Claustrophobia 06/29/2015  . Foot drop, left 06/29/2015  . Hiatal hernia 06/29/2015  . H/O diabetes mellitus 06/29/2015  . H/O testicular cancer 06/29/2015  . Acquired hypothyroidism 06/29/2015  . Dysmetabolic syndrome 123XX123  . Adult BMI 30+ 06/29/2015  . Allergic rhinitis 06/29/2015  . Neuropathy ([place]) 06/29/2015  . Seborrheic keratoses 06/29/2015  . Vitamin D deficiency 06/29/2015  . Bariatric surgery status 02/20/2012    Past Surgical History  Procedure Laterality Date  . 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 Back  surgery      X 2  . Hernia repair      left side and umbilical hernia repair  . Ureterotomy Left 2000    Family History  Problem Relation Age of Onset  . Coronary artery disease Mother   . Cancer Father     Melanoma and Testicular  . Diabetes Father     Social History   Social History  . Marital Status: Married    Spouse Name: N/A  . Number of Children: N/A  . Years of Education: N/A   Occupational History  . Not on file.   Social History Main Topics  . Smoking status: Former Smoker -- 3.00 packs/day for 8 years    Types: Cigarettes, Cigars, Pipe    Start date: 06/28/1984    Quit date: 12/24/1991  . Smokeless tobacco: Former Systems developer    Types: [place] date: 12/24/1991  . Alcohol Use: No  . Drug Use: No  . Sexual Activity:    Partners: Female   Other Topics Concern  . Not on file   Social History Narrative     Current outpatient prescriptions:  .  diclofenac sodium (VOLTAREN) 1 % GEL, Place onto the skin., Disp: , Rfl:  .  fexofenadine (ALLEGRA ALLERGY) 180 MG tablet, Take 1 tablet by mouth daily., Disp: , Rfl:  .  HYDROcodone-acetaminophen (NORCO) 7.5-325 MG tablet, Take 1 tablet by mouth every 6 (six) hours as needed for moderate pain.,  Disp: 100 tablet, Rfl: 0 .  HYDROcodone-acetaminophen (NORCO) 7.5-325 MG tablet, Take 1 tablet by mouth every 6 (six) hours as needed., Disp: 100 tablet, Rfl: 0 .  HYDROcodone-acetaminophen (NORCO) 7.5-325 MG tablet, Take 1 tablet by mouth every 6 (six) hours as needed for moderate pain., Disp: 100 tablet, Rfl: 0 .  levothyroxine (SYNTHROID) 137 MCG tablet, Take 1 tablet (137 mcg total) by mouth daily., Disp: 30 tablet, Rfl: 5 .  pregabalin (LYRICA) 225 MG capsule, Take 1 capsule (225 mg total) by mouth 3 (three) times daily., Disp: 90 capsule, Rfl: 5  Allergies  Allergen Reactions  . Morphine Itching     ROS  Constitutional: Negative for fever or weight change.  Respiratory: Negative for cough and shortness of breath.    Cardiovascular: Negative for chest pain or palpitations.  Gastrointestinal: Negative for abdominal pain, no bowel changes.  Musculoskeletal: Negative for gait problem or joint swelling.  Skin: Negative for rash.  Neurological: Negative for dizziness or headache.  No other specific complaints in a complete review of systems (except as listed in HPI above).  Objective  Filed Vitals:   11/02/15 1611  BP: 142/96  Pulse: 67  Temp: 98.5 F (36.9 C)  TempSrc: Oral  Resp: 16  Height: 5\' 9"  (1.753 m)  Weight: 294 lb 12.8 oz (133.72 kg)  SpO2: 99%    Body mass index is 43.51 kg/(m^2).  Physical Exam  Constitutional: Patient appears well-developed and well-nourished. Obese No distress.  HEENT: head atraumatic, normocephalic, pupils equal and reactive to light, nystagmus is lateral neck supple, no thyromegaly Cardiovascular: Normal rate, regular rhythm and normal heart sounds. No murmur heard. No BLE edema. Pulmonary/Chest: Effort normal and breath sounds normal. No respiratory distress. Abdominal: Soft. There is no tenderness. Psychiatric: Patient has a normal mood and affect. behavior is normal. Judgment and thought content normal. Muscular Skeletal: pain during palpation of lumbar spine,neg straight leg raise, crepitus with extension of both knees,  Neuro: normal sensation on legs  PHQ2/9: Depression screen [facility] 2/9 11/02/2015 06/29/2015  Decreased Interest 0 0  Down, Depressed, Hopeless 0 0  PHQ - 2 Score 0 0     Fall Risk: Fall Risk  11/02/2015 06/29/2015  Falls in the past year? No No     Functional Status Survey: Is the patient deaf or have difficulty hearing?: No Does the patient have difficulty seeing, even when wearing glasses/contacts?: No Does the patient have difficulty concentrating, remembering, or making decisions?: No Does the patient have difficulty walking or climbing stairs?: No Does the patient have difficulty dressing or bathing?: No Does the patient  have difficulty doing errands alone such as visiting a doctor's office or shopping?: No    Assessment & Plan  1. Chronic back pain  We will increase the amount of pain medication to 100 since it is winter and pain gets worse - HYDROcodone-acetaminophen (NORCO) 7.5-325 MG tablet; Take 1 tablet by mouth every 6 (six) hours as needed for moderate pain.  Dispense: 100 tablet; Refill: 0 - HYDROcodone-acetaminophen (NORCO) 7.5-325 MG tablet; Take 1 tablet by mouth every 6 (six) hours as needed.  Dispense: 100 tablet; Refill: 0 - HYDROcodone-acetaminophen (NORCO) 7.5-325 MG tablet; Take 1 tablet by mouth every 6 (six) hours as needed for moderate pain.  Dispense: 100 tablet; Refill: 0  2. Needs flu shot  - Flu Vaccine QUAD 36+ mos PF IM (Fluarix & Fluzone Quad PF) -refused  3. Neuropathy (HCC)  Stable, continue Lyrica  4. Vitamin D deficiency  -  Vit D  25 hydroxy (rtn osteoporosis monitoring)  5. H/O diabetes mellitus  - Hemoglobin A1c  6. Bariatric surgery status  Continue life style modification  7. Acquired hypothyroidism  - TSH  8. Long-term use of high-risk medication  - Comprehensive metabolic panel - CBC with Differential/Platelet  9. Dyslipidemia  - Lipid panel  10. Hypertension, benign  He would like to monitor bp at home and if remains high we will resume medication

## 2015-11-02 NOTE — Telephone Encounter (Signed)
Patient needs to scheduled for 3 month f/u

## 2015-11-03 DIAGNOSIS — I10 Essential (primary) hypertension: Secondary | ICD-10-CM | POA: Insufficient documentation

## 2015-11-03 NOTE — Telephone Encounter (Signed)
Appointment made for Feb. 2017

## 2016-01-17 IMAGING — CR DG ANKLE COMPLETE 3+V*L*
1 series · 3 of 3 positions shown · non-contrast
Comparison: None.

CLINICAL DATA: No known injury.  Ankle pain since [REDACTED]

EXAM:
LEFT ANKLE COMPLETE - 3+ VIEW

[Series 1: x ankle ap left · 0.14mm/px · 3 of 3 slices shown]
[im 1/3]
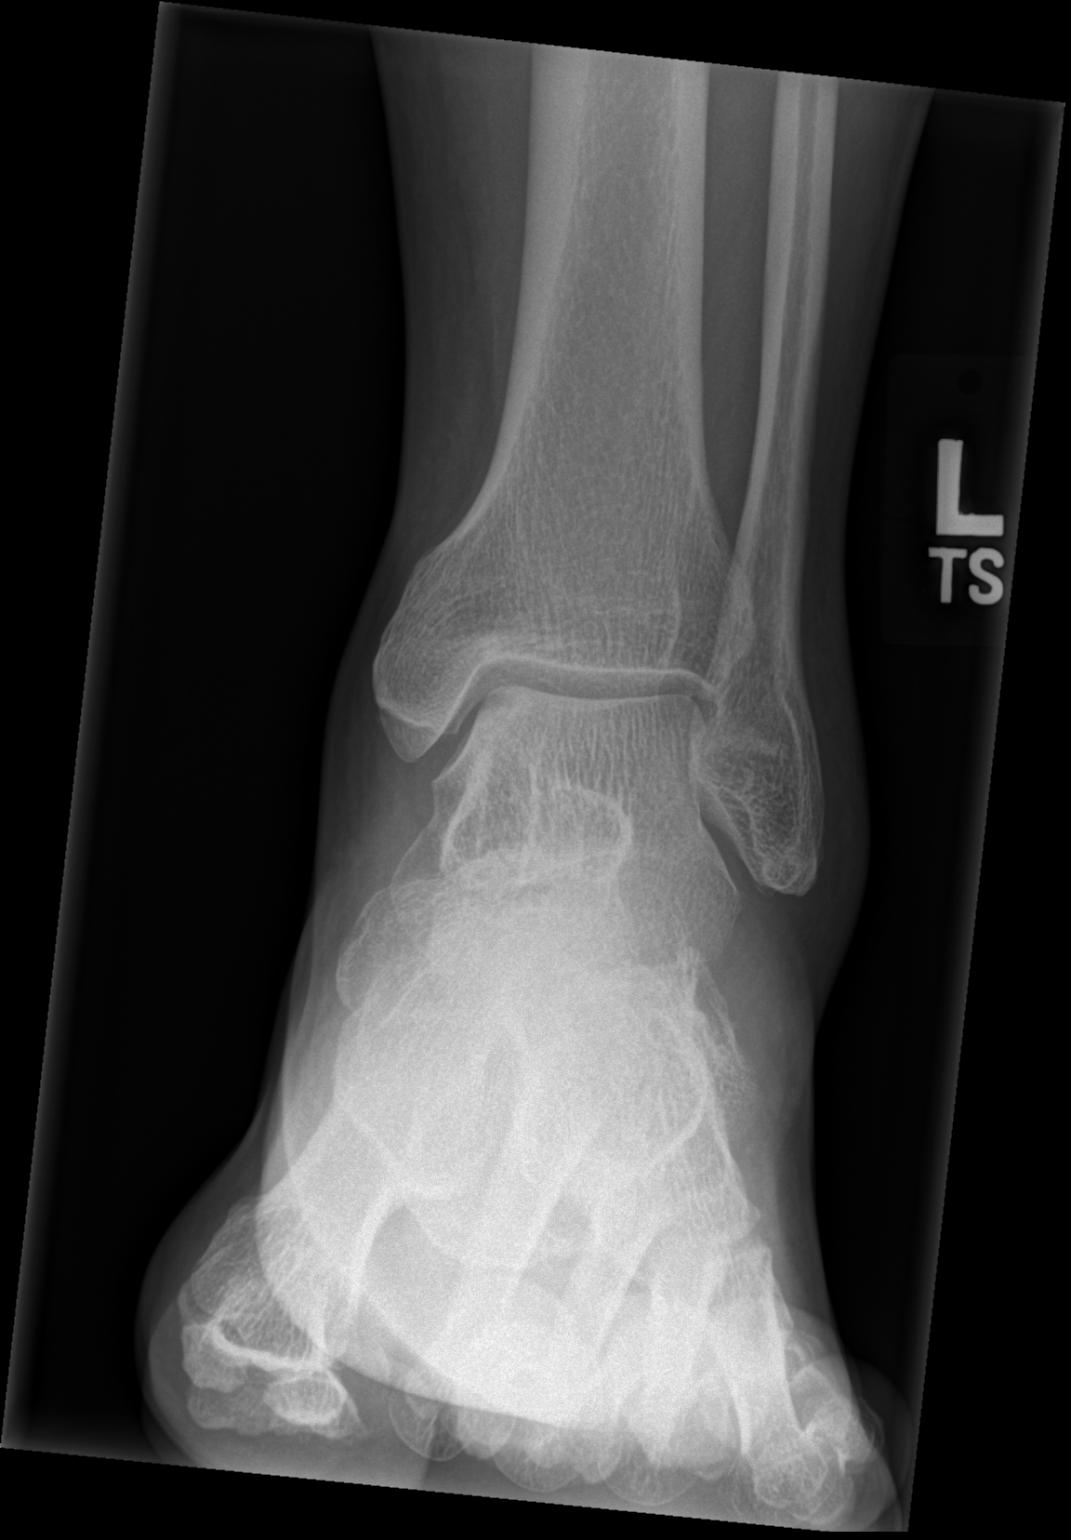
[im 2/3]
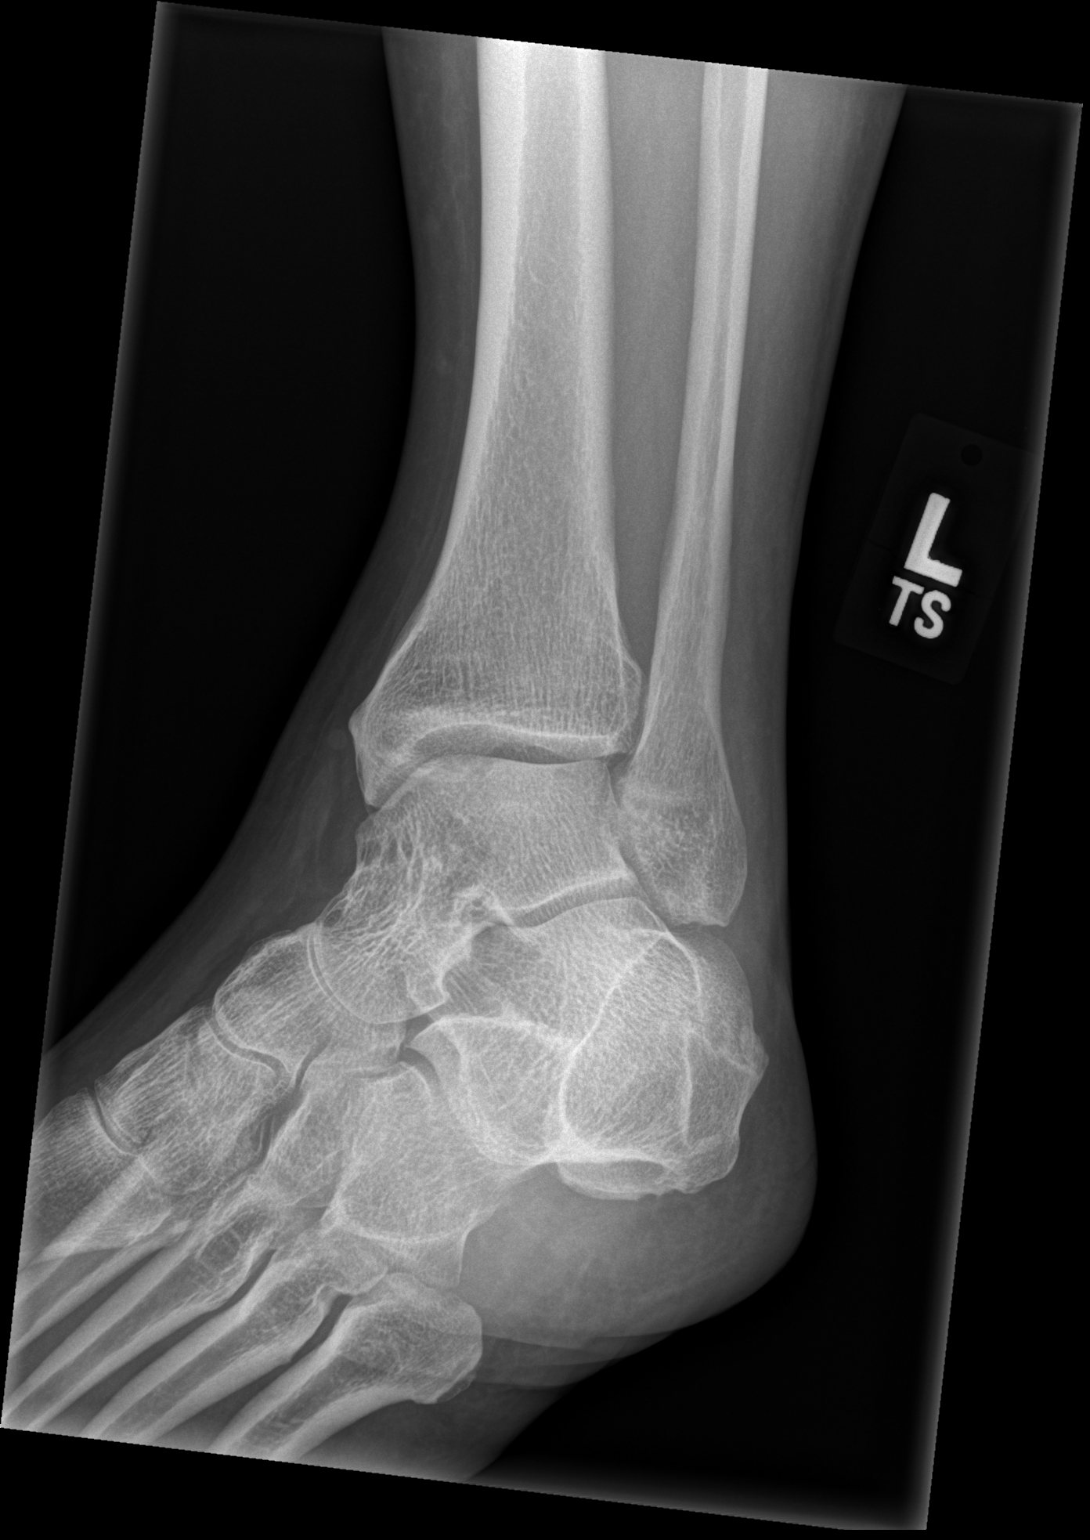
[im 3/3]
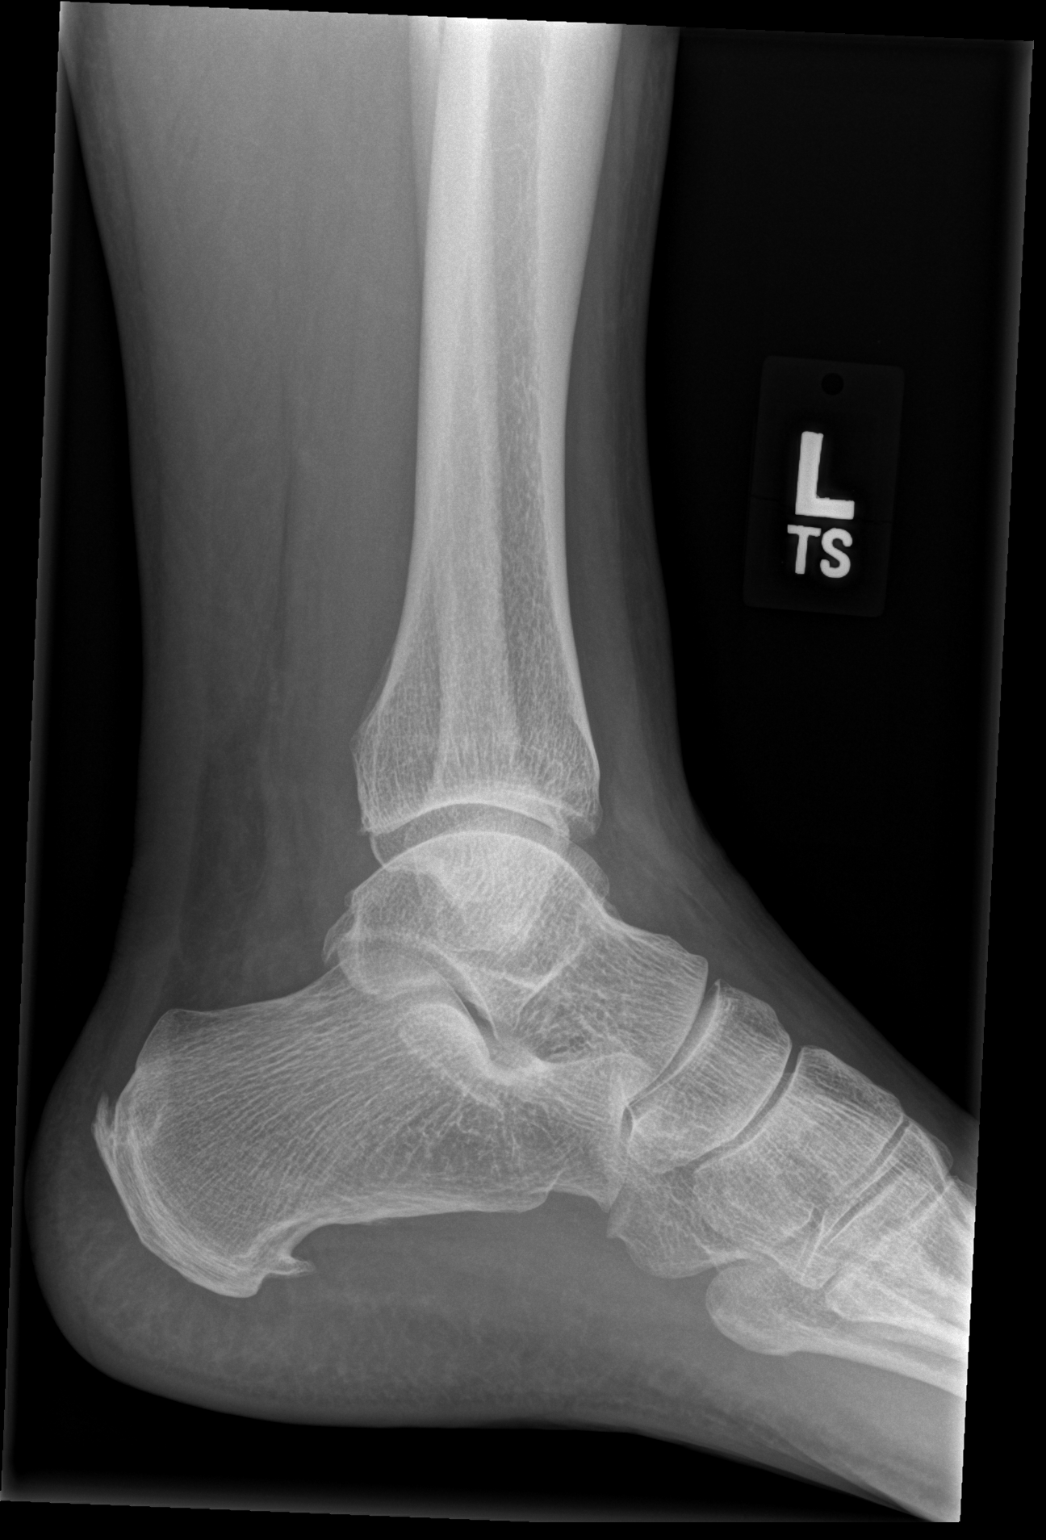

[3 of 3 positions shown; findings below may reference images not displayed]

FINDINGS: There is no evidence of fracture, dislocation, or joint effusion.
There is no evidence of arthropathy or other focal bone abnormality.
Calcaneal spurs without erosion.
IMPRESSION: No acute osseous findings.

## 2016-02-05 ENCOUNTER — Encounter: Payer: Self-pay | Admitting: Family Medicine

## 2016-02-05 ENCOUNTER — Ambulatory Visit (INDEPENDENT_AMBULATORY_CARE_PROVIDER_SITE_OTHER): Payer: 59 | Admitting: Family Medicine

## 2016-02-05 VITALS — BP 134/86 | HR 83 | Temp 98.3°F | Resp 18 | Ht 69.0 in | Wt 306.9 lb

## 2016-02-05 DIAGNOSIS — G8929 Other chronic pain: Secondary | ICD-10-CM

## 2016-02-05 DIAGNOSIS — E559 Vitamin D deficiency, unspecified: Secondary | ICD-10-CM

## 2016-02-05 DIAGNOSIS — E785 Hyperlipidemia, unspecified: Secondary | ICD-10-CM | POA: Diagnosis not present

## 2016-02-05 DIAGNOSIS — Z79899 Other long term (current) drug therapy: Secondary | ICD-10-CM | POA: Diagnosis not present

## 2016-02-05 DIAGNOSIS — E039 Hypothyroidism, unspecified: Secondary | ICD-10-CM

## 2016-02-05 DIAGNOSIS — Z8639 Personal history of other endocrine, nutritional and metabolic disease: Secondary | ICD-10-CM | POA: Diagnosis not present

## 2016-02-05 DIAGNOSIS — I10 Essential (primary) hypertension: Secondary | ICD-10-CM | POA: Diagnosis not present

## 2016-02-05 DIAGNOSIS — Z9884 Bariatric surgery status: Secondary | ICD-10-CM | POA: Diagnosis not present

## 2016-02-05 DIAGNOSIS — M549 Dorsalgia, unspecified: Secondary | ICD-10-CM

## 2016-02-05 DIAGNOSIS — G629 Polyneuropathy, unspecified: Secondary | ICD-10-CM | POA: Diagnosis not present

## 2016-02-05 MED ORDER — PREGABALIN 225 MG PO CAPS: 225.0000 mg | ORAL_CAPSULE | Freq: Three times a day (TID) | ORAL | Status: DC

## 2016-02-05 MED ORDER — HYDROCODONE-ACETAMINOPHEN 7.5-325 MG PO TABS: 1.0000 | ORAL_TABLET | Freq: Four times a day (QID) | ORAL | Status: DC | PRN

## 2016-02-05 MED ORDER — LEVOTHYROXINE SODIUM 137 MCG PO TABS: 137.0000 ug | ORAL_TABLET | Freq: Every day | ORAL | Status: DC

## 2016-02-05 NOTE — Progress Notes (Signed)
Name: [name]   MRN: [id number]    DOB: 1970/12/26   Date:02/05/2016       Progress Note  Subjective  Chief Complaint  Chief Complaint  Patient presents with  . Medication Refill  . Back Pain    Unchanged  . Allergic Rhinitis     worst due to weather  . Hypothyroidism    hands are dry and crack all the time    HPI  Hypothyroidism: taking same dose of Synthroid for many months. He has chronic dry skin, no hair loss, no constipation, but he has gained weight recently. He is compliant with his medication, but he had a small gap because of insurance problems.  Due for lab recheck   Chronic low back pain : he is taking Norco 7.5/325mg  during the winter he needs to take more often but at this time, pain is controlled with at most three pills daily, he denies misuse or selling. Since the weather has gotten colder he is having more pain. He is working outdoors. No longer under disability , working full time and has new insurance. Pain contract singed 02/05/2016  OA: he still has bilateral knee pain, but left knee is worse , uses Voltaren topically prn but not recently  and also takes hydrodocodone  Neuropathy: he has bilateral daily pain on both legs, aching like, he denies burning or prickly sensation, Lyrica usually controls symptoms. He has noticed early awakening because of pain - around 4:30 or 5 am because of medication running out of his system.   History of bariatric surgery: he is morbidly obese - lowest weight after surgery was 275 lbs, but is gradually gaining it back. He has gained almost 12 lbs since last visit. He states he was eating out for lunch frequently, but started to pack his lunch again. He has been drinking sweet tea again - but is half and half and only once a day ( 32 ounce cup ).   HTN: off medication now, we will monitor bp, still at goal, no chest pain or palpitation  Patient Active Problem List   Diagnosis Date Noted  . Hypertension, benign 11/03/2015  .  Dyslipidemia 11/02/2015  . Acquired nystagmus 08/01/2015  . Chronic back pain 06/29/2015  . Carpal tunnel syndrome 06/29/2015  . Osteoarthritis 06/29/2015  . Claustrophobia 06/29/2015  . Foot drop, left 06/29/2015  . Hiatal hernia 06/29/2015  . H/O diabetes mellitus 06/29/2015  . H/O testicular cancer 06/29/2015  . Acquired hypothyroidism 06/29/2015  . Dysmetabolic syndrome 123XX123  . Morbid obesity ([place]) 06/29/2015  . Allergic rhinitis 06/29/2015  . Neuropathy ([place]) 06/29/2015  . Seborrheic keratoses 06/29/2015  . Vitamin D deficiency 06/29/2015  . Bariatric surgery status 02/20/2012    Past Surgical History  Procedure Laterality Date  . 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 Back surgery      X 2  . Hernia repair      left side and umbilical hernia repair  . Ureterotomy Left 2000    Family History  Problem Relation Age of Onset  . Coronary artery disease Mother   . Cancer Father     Melanoma and Testicular  . Diabetes Father     Social History   Social History  . Marital Status: Married    Spouse Name: N/A  . Number of Children: N/A  . Years of Education: N/A   Occupational History  . Not on file.   Social History Main Topics  . Smoking status: Former  Smoker -- 3.00 packs/day for 8 years    Types: Cigarettes, Cigars, Pipe    Start date: 06/28/1984    Quit date: 12/24/1991  . Smokeless tobacco: Former Systems developer    Types: [place] date: 12/24/1991  . Alcohol Use: No  . Drug Use: No  . Sexual Activity:    Partners: Female   Other Topics Concern  . Not on file   Social History Narrative     Current outpatient prescriptions:  .  diclofenac sodium (VOLTAREN) 1 % GEL, Place onto the skin., Disp: , Rfl:  .  fexofenadine (ALLEGRA ALLERGY) 180 MG tablet, Take 1 tablet by mouth daily., Disp: , Rfl:  .  HYDROcodone-acetaminophen (NORCO) 7.5-325 MG tablet, Take 1 tablet by mouth 4 (four) times daily as needed for moderate pain., Disp: 100 tablet, Rfl: 0 .   HYDROcodone-acetaminophen (NORCO) 7.5-325 MG tablet, Take 1 tablet by mouth 4 (four) times daily as needed for moderate pain., Disp: 100 tablet, Rfl: 0 .  HYDROcodone-acetaminophen (NORCO) 7.5-325 MG tablet, Take 1 tablet by mouth 4 (four) times daily as needed., Disp: 100 tablet, Rfl: 0 .  levothyroxine (SYNTHROID) 137 MCG tablet, Take 1 tablet (137 mcg total) by mouth daily., Disp: 30 tablet, Rfl: 5 .  pregabalin (LYRICA) 225 MG capsule, Take 1 capsule (225 mg total) by mouth 3 (three) times daily., Disp: 90 capsule, Rfl: 5  Allergies  Allergen Reactions  . Morphine Itching     ROS  Constitutional: Negative for fever , positive for weight change ( gain ).  Respiratory: Negative for cough and shortness of breath.   Cardiovascular: Negative for chest pain or palpitations.  Gastrointestinal: Negative for abdominal pain, no bowel changes.  Musculoskeletal: Negative for gait problem or joint swelling.  Skin: Negative for rash.  Neurological: Negative for dizziness or headache.  No other specific complaints in a complete review of systems (except as listed in HPI above).  Objective  Filed Vitals:   02/05/16 1632  BP: 134/86  Pulse: 83  Temp: 98.3 F (36.8 C)  TempSrc: Oral  Resp: 18  Height: 5\' 9"  (1.753 m)  Weight: 306 lb 14.4 oz (139.209 kg)  SpO2: 95%    Body mass index is 45.3 kg/(m^2).  Physical Exam  Constitutional: Patient appears well-developed. Obese No distress.  HEENT: head atraumatic, normocephalic, pupils equal and reactive to light, nsytagmus, neck supple, throat within normal limits Cardiovascular: Normal rate, regular rhythm and normal heart sounds.  No murmur heard. No BLE edema. Pulmonary/Chest: Effort normal and breath sounds normal. No respiratory distress. Abdominal: Soft.  There is no tenderness. Psychiatric: Patient has a normal mood and affect. behavior is normal. Judgment and thought content normal. Muscular skeletal: pain during palpation of  lumbar spine, negative straight leg raise  PHQ2/9: Depression screen [facility] 2/9 02/05/2016 11/02/2015 06/29/2015  Decreased Interest 0 0 0  Down, Depressed, Hopeless 0 0 0  PHQ - 2 Score 0 0 0    Fall Risk: Fall Risk  02/05/2016 11/02/2015 06/29/2015  Falls in the past year? No No No    Functional Status Survey: Is the patient deaf or have difficulty hearing?: No Does the patient have difficulty seeing, even when wearing glasses/contacts?: No Does the patient have difficulty concentrating, remembering, or making decisions?: No Does the patient have difficulty walking or climbing stairs?: No Does the patient have difficulty dressing or bathing?: No Does the patient have difficulty doing errands alone such as visiting a doctor's office or shopping?: No  Assessment & Plan  1. Neuropathy (HCC)  - pregabalin (LYRICA) 225 MG capsule; Take 1 capsule (225 mg total) by mouth 3 (three) times daily.  Dispense: 90 capsule; Refill: 5  2. Morbid obesity, unspecified obesity type [facility])  Discussed with the patient the risk posed by an increased BMI. Discussed importance of portion control, calorie counting and at least 150 minutes of physical activity weekly. Avoid sweet beverages and drink more water. Eat at least 6 servings of fruit and vegetables daily   3. Hypertension, benign  On life style modification only  4. Acquired hypothyroidism  - levothyroxine (SYNTHROID) 137 MCG tablet; Take 1 tablet (137 mcg total) by mouth daily.  Dispense: 30 tablet; Refill: 5 - TSH  5. Chronic back pain  Pain contract signed again on 02/05/2016 - HYDROcodone-acetaminophen (NORCO) 7.5-325 MG tablet; Take 1 tablet by mouth 4 (four) times daily as needed for moderate pain.  Dispense: 100 tablet; Refill: 0 - HYDROcodone-acetaminophen (NORCO) 7.5-325 MG tablet; Take 1 tablet by mouth 4 (four) times daily as needed for moderate pain.  Dispense: 100 tablet; Refill: 0 - HYDROcodone-acetaminophen (NORCO) 7.5-325 MG  tablet; Take 1 tablet by mouth 4 (four) times daily as needed.  Dispense: 100 tablet; Refill: 0  6. Bariatric surgery status  - Vitamin B12 - Comprehensive metabolic panel - CBC with Differential/Platelet  7. Vitamin D deficiency  - VITAMIN D 25 Hydroxy (Vit-D Deficiency, Fractures)  8. Dyslipidemia  - Lipid panel  9. H/O diabetes mellitus  - Hemoglobin A1c  10. Long-term use of high-risk medication  - Comprehensive metabolic panel - CBC with Differential/Platelet

## 2016-02-26 ENCOUNTER — Other Ambulatory Visit: Payer: Self-pay | Admitting: Family Medicine

## 2016-02-26 NOTE — Telephone Encounter (Signed)
Patient requesting refill. 

## 2016-03-08 ENCOUNTER — Telehealth: Payer: Self-pay | Admitting: Family Medicine

## 2016-03-08 MED ORDER — OSELTAMIVIR PHOSPHATE 75 MG PO CAPS: 75.0000 mg | ORAL_CAPSULE | Freq: Every day | ORAL | Status: DC

## 2016-03-08 NOTE — Telephone Encounter (Signed)
Patient's kids have been diagnosised with the Flu and patient is needing to have Tamiflu 75 mg 1 tablet once a day for 10 days sent into [facility]

## 2016-03-08 NOTE — Telephone Encounter (Signed)
done

## 2016-03-16 ENCOUNTER — Other Ambulatory Visit
Admission: RE | Admit: 2016-03-16 | Discharge: 2016-03-16 | Disposition: A | Discharge status: Home / Self Care | Payer: 59 | Source: Ambulatory Visit | Attending: Family Medicine | Admitting: Family Medicine

## 2016-03-16 DIAGNOSIS — Z79899 Other long term (current) drug therapy: Secondary | ICD-10-CM | POA: Insufficient documentation

## 2016-03-16 DIAGNOSIS — Z8639 Personal history of other endocrine, nutritional and metabolic disease: Secondary | ICD-10-CM | POA: Insufficient documentation

## 2016-03-16 DIAGNOSIS — Z9884 Bariatric surgery status: Secondary | ICD-10-CM | POA: Diagnosis present

## 2016-03-16 DIAGNOSIS — E039 Hypothyroidism, unspecified: Secondary | ICD-10-CM | POA: Insufficient documentation

## 2016-03-16 LAB — CBC WITH DIFFERENTIAL/PLATELET
Basophils Absolute: 0 10*3/uL (ref 0–0.1)
Basophils Relative: 0 %
Eosinophils Absolute: 0.2 10*3/uL (ref 0–0.7)
Eosinophils Relative: 2 %
HCT: 41.4 % (ref 40.0–52.0)
Hemoglobin: 13.5 g/dL (ref 13.0–18.0)
Lymphocytes Relative: 8 %
Lymphs Abs: 1 10*3/uL (ref 1.0–3.6)
MCH: 25.9 pg — ABNORMAL LOW (ref 26.0–34.0)
MCHC: 32.7 g/dL (ref 32.0–36.0)
MCV: 79.1 fL — ABNORMAL LOW (ref 80.0–100.0)
Monocytes Absolute: 1.1 10*3/uL — ABNORMAL HIGH (ref 0.2–1.0)
Monocytes Relative: 9 %
Neutro Abs: 10.2 10*3/uL — ABNORMAL HIGH (ref 1.4–6.5)
Neutrophils Relative %: 81 %
Platelets: 206 10*3/uL (ref 150–440)
RBC: 5.23 MIL/uL (ref 4.40–5.90)
RDW: 15.1 % — ABNORMAL HIGH (ref 11.5–14.5)
WBC: 12.6 10*3/uL — ABNORMAL HIGH (ref 3.8–10.6)

## 2016-03-16 LAB — COMPREHENSIVE METABOLIC PANEL
ALT: 11 U/L — ABNORMAL LOW (ref 17–63)
AST: 18 U/L (ref 15–41)
Albumin: 4.4 g/dL (ref 3.5–5.0)
Alkaline Phosphatase: 95 U/L (ref 38–126)
Anion gap: 6 (ref 5–15)
BUN: 19 mg/dL (ref 6–20)
CO2: 27 mmol/L (ref 22–32)
Calcium: 8.5 mg/dL — ABNORMAL LOW (ref 8.9–10.3)
Chloride: 106 mmol/L (ref 101–111)
Creatinine, Ser: 1.09 mg/dL (ref 0.61–1.24)
GFR calc Af Amer: >60 mL/min (ref >60)
GFR calc non Af Amer: >60 mL/min (ref >60)
Glucose, Bld: 114 mg/dL — ABNORMAL HIGH (ref 65–99)
Potassium: 3.9 mmol/L (ref 3.5–5.1)
Sodium: 139 mmol/L (ref 135–145)
Total Bilirubin: 1.3 mg/dL — ABNORMAL HIGH (ref 0.3–1.2)
Total Protein: 7.3 g/dL (ref 6.5–8.1)

## 2016-03-16 LAB — LIPID PANEL
Cholesterol: 132 mg/dL (ref 0–200)
HDL: 29 mg/dL — ABNORMAL LOW (ref >40)
LDL Cholesterol: 74 mg/dL (ref 0–99)
Total CHOL/HDL Ratio: 4.6 RATIO
Triglycerides: 145 mg/dL (ref <150)
VLDL: 29 mg/dL (ref 0–40)

## 2016-03-16 LAB — VITAMIN B12: Vitamin B-12: 352 pg/mL (ref 180–914)

## 2016-03-16 LAB — HEMOGLOBIN A1C: Hgb A1c MFr Bld: 5.8 % (ref 4.0–6.0)

## 2016-03-16 LAB — TSH: TSH: 1.686 u[IU]/mL (ref 0.350–4.500)

## 2016-03-17 ENCOUNTER — Other Ambulatory Visit: Payer: Self-pay | Admitting: Family Medicine

## 2016-03-17 DIAGNOSIS — D72829 Elevated white blood cell count, unspecified: Secondary | ICD-10-CM

## 2016-03-18 ENCOUNTER — Other Ambulatory Visit: Payer: Self-pay | Admitting: Family Medicine

## 2016-03-18 LAB — VITAMIN D 25 HYDROXY (VIT D DEFICIENCY, FRACTURES): Vit D, 25-Hydroxy: 21.4 ng/mL — ABNORMAL LOW (ref 30.0–100.0)

## 2016-03-18 MED ORDER — VITAMIN D (ERGOCALCIFEROL) 1.25 MG (50000 UNIT) PO CAPS: 50000.0000 [IU] | ORAL_CAPSULE | ORAL | Status: DC

## 2016-04-08 ENCOUNTER — Other Ambulatory Visit: Payer: Self-pay | Admitting: Family Medicine

## 2016-04-08 NOTE — Telephone Encounter (Signed)
Patient requesting refill. 

## 2016-05-01 ENCOUNTER — Telehealth: Payer: Self-pay | Admitting: Family Medicine

## 2016-05-01 NOTE — Telephone Encounter (Signed)
Pts job is requiring him to wear a certain shoe but the only shoe he can find that does not cause pain to his feet and back are [employer] 608. Pt is requesting a note stating due to his condition he needs to wear these shoes.Please advise.

## 2016-05-01 NOTE — Telephone Encounter (Signed)
I can , but it will be his employers responsibility if they require steel toe and he gets hurt from wearing regular tennis shoes. They may not approve it

## 2016-05-01 NOTE — Telephone Encounter (Signed)
Please advise 

## 2016-05-21 ENCOUNTER — Telehealth: Payer: Self-pay | Admitting: Family Medicine

## 2016-05-21 NOTE — Telephone Encounter (Signed)
Wife Lenna Sciara) is asking if you could order lab work for her husband to check for infection. States he runs fever off and on. I did offer an appointment but she wanted me to send message instead because she said he will not be willing to come in. She is afraid that his cancer has come back. There is a knot on his leg that she can feel the knot but the knot does not hurt him.

## 2016-05-22 NOTE — Telephone Encounter (Signed)
Spoke with patient and she will speak with her husband and see what he would like to do.

## 2016-05-22 NOTE — Telephone Encounter (Signed)
He needs to be seen

## 2016-06-04 ENCOUNTER — Ambulatory Visit: Payer: 59 | Admitting: Family Medicine

## 2016-06-11 ENCOUNTER — Ambulatory Visit (INDEPENDENT_AMBULATORY_CARE_PROVIDER_SITE_OTHER): Payer: 59 | Admitting: Family Medicine

## 2016-06-11 ENCOUNTER — Encounter: Payer: Self-pay | Admitting: Family Medicine

## 2016-06-11 VITALS — BP 122/84 | HR 108 | Temp 98.3°F | Resp 18 | Wt 297.0 lb

## 2016-06-11 DIAGNOSIS — H1013 Acute atopic conjunctivitis, bilateral: Secondary | ICD-10-CM

## 2016-06-11 DIAGNOSIS — E538 Deficiency of other specified B group vitamins: Secondary | ICD-10-CM

## 2016-06-11 DIAGNOSIS — E559 Vitamin D deficiency, unspecified: Secondary | ICD-10-CM

## 2016-06-11 DIAGNOSIS — M79605 Pain in left leg: Secondary | ICD-10-CM

## 2016-06-11 DIAGNOSIS — E039 Hypothyroidism, unspecified: Secondary | ICD-10-CM

## 2016-06-11 DIAGNOSIS — I10 Essential (primary) hypertension: Secondary | ICD-10-CM | POA: Diagnosis not present

## 2016-06-11 DIAGNOSIS — R252 Cramp and spasm: Secondary | ICD-10-CM

## 2016-06-11 DIAGNOSIS — E785 Hyperlipidemia, unspecified: Secondary | ICD-10-CM

## 2016-06-11 DIAGNOSIS — Z9884 Bariatric surgery status: Secondary | ICD-10-CM

## 2016-06-11 DIAGNOSIS — M549 Dorsalgia, unspecified: Secondary | ICD-10-CM | POA: Diagnosis not present

## 2016-06-11 DIAGNOSIS — R12 Heartburn: Secondary | ICD-10-CM

## 2016-06-11 DIAGNOSIS — M79602 Pain in left arm: Secondary | ICD-10-CM | POA: Diagnosis not present

## 2016-06-11 DIAGNOSIS — R509 Fever, unspecified: Secondary | ICD-10-CM

## 2016-06-11 DIAGNOSIS — G629 Polyneuropathy, unspecified: Secondary | ICD-10-CM | POA: Diagnosis not present

## 2016-06-11 DIAGNOSIS — H1010 Acute atopic conjunctivitis, unspecified eye: Secondary | ICD-10-CM | POA: Insufficient documentation

## 2016-06-11 DIAGNOSIS — G8929 Other chronic pain: Secondary | ICD-10-CM

## 2016-06-11 DIAGNOSIS — D72829 Elevated white blood cell count, unspecified: Secondary | ICD-10-CM

## 2016-06-11 LAB — CBC WITH DIFFERENTIAL/PLATELET
Basophils Absolute: 65 cells/uL (ref 0–200)
Basophils Relative: 1 %
Eosinophils Absolute: 130 cells/uL (ref 15–500)
Eosinophils Relative: 2 %
HCT: 36.2 % — ABNORMAL LOW (ref 38.5–50.0)
Hemoglobin: 11.6 g/dL — ABNORMAL LOW (ref 13.2–17.1)
Lymphocytes Relative: 35 %
Lymphs Abs: 2275 cells/uL (ref 850–3900)
MCH: 25.1 pg — ABNORMAL LOW (ref 27.0–33.0)
MCHC: 32 g/dL (ref 32.0–36.0)
MCV: 78.4 fL — ABNORMAL LOW (ref 80.0–100.0)
MPV: 9.2 fL (ref 7.5–12.5)
Monocytes Absolute: 780 cells/uL (ref 200–950)
Monocytes Relative: 12 %
Neutro Abs: 3250 cells/uL (ref 1500–7800)
Neutrophils Relative %: 50 %
Platelets: 199 10*3/uL (ref 140–400)
RBC: 4.62 MIL/uL (ref 4.20–5.80)
RDW: 15.5 % — ABNORMAL HIGH (ref 11.0–15.0)
WBC: 6.5 10*3/uL (ref 3.8–10.8)

## 2016-06-11 LAB — TSH: TSH: 1.95 mIU/L (ref 0.40–4.50)

## 2016-06-11 MED ORDER — PREGABALIN 225 MG PO CAPS: 225.0000 mg | ORAL_CAPSULE | Freq: Two times a day (BID) | ORAL | Status: DC

## 2016-06-11 MED ORDER — HYDROCODONE-ACETAMINOPHEN 7.5-325 MG PO TABS: 1.0000 | ORAL_TABLET | Freq: Four times a day (QID) | ORAL | Status: DC | PRN

## 2016-06-11 MED ORDER — CYCLOBENZAPRINE HCL 10 MG PO TABS: 10.0000 mg | ORAL_TABLET | Freq: Every day | ORAL | Status: DC

## 2016-06-11 MED ORDER — CYANOCOBALAMIN 1000 MCG/ML IJ SOLN
1000.0000 ug | Freq: Once | INTRAMUSCULAR | Status: AC
  Administered 2016-06-11: 1000 ug via INTRAMUSCULAR

## 2016-06-11 MED ORDER — AZELASTINE HCL 0.05 % OP SOLN
1.0000 [drp] | Freq: Two times a day (BID) | OPHTHALMIC | Status: DC
Start: 2016-06-11 — End: 2017-02-11

## 2016-06-11 MED ORDER — VITAMIN D (ERGOCALCIFEROL) 1.25 MG (50000 UNIT) PO CAPS: 50000.0000 [IU] | ORAL_CAPSULE | ORAL | Status: DC
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
## 2016-06-12 ENCOUNTER — Other Ambulatory Visit: Payer: Self-pay | Admitting: Family Medicine

## 2016-06-12 ENCOUNTER — Encounter: Payer: Self-pay | Admitting: Family Medicine

## 2016-06-12 DIAGNOSIS — D649 Anemia, unspecified: Secondary | ICD-10-CM | POA: Insufficient documentation

## 2016-06-12 LAB — COMPREHENSIVE METABOLIC PANEL
ALT: 16 U/L (ref 9–46)
AST: 26 U/L (ref 10–40)
Albumin: 4.3 g/dL (ref 3.6–5.1)
Alkaline Phosphatase: 100 U/L (ref 40–115)
BUN: 14 mg/dL (ref 7–25)
CO2: 27 mmol/L (ref 20–31)
Calcium: 9.1 mg/dL (ref 8.6–10.3)
Chloride: 106 mmol/L (ref 98–110)
Creat: 1.07 mg/dL (ref 0.60–1.35)
Glucose, Bld: 72 mg/dL (ref 65–99)
Potassium: 4.6 mmol/L (ref 3.5–5.3)
Sodium: 142 mmol/L (ref 135–146)
Total Bilirubin: 0.7 mg/dL (ref 0.2–1.2)
Total Protein: 7.1 g/dL (ref 6.1–8.1)

## 2016-06-12 LAB — SEDIMENTATION RATE: Sed Rate: 4 mm/hr (ref 0–15)

## 2016-06-12 LAB — C-REACTIVE PROTEIN: CRP: <0.5 mg/dL (ref <0.60)

## 2016-06-12 LAB — CALCIUM, IONIZED: Calcium, Ion: 5 mg/dL (ref 4.8–5.6)

## 2016-06-14 LAB — IRON AND TIBC
%SAT: 4 % — ABNORMAL LOW (ref 15–60)
Iron: 14 ug/dL — ABNORMAL LOW (ref 50–180)
TIBC: 384 ug/dL (ref 250–425)
UIBC: 370 ug/dL (ref 125–400)

## 2016-06-14 LAB — FERRITIN: Ferritin: 12 ng/mL — ABNORMAL LOW (ref 20–380)

## 2016-07-03 IMAGING — CT CT ABD-PELV W/ CM
2 of 5 series · 16 of 46 positions shown, 18 images · IV contrast (omnipaque)
Comparison: 12/27/2002

CLINICAL DATA: Initial encounter for left upper quadrant pain
radiating to umbilicus for 2 weeks. Lifting injury. History of
testicular cancer with surgical resection and radiation therapy in
7997. Gastric bypass. Inguinal hernia repair x2.

EXAM:
CT ABDOMEN AND PELVIS WITH CONTRAST
TECHNIQUE: Multidetector CT imaging of the abdomen and pelvis was performed
using the standard protocol following bolus administration of
intravenous contrast.
CONTRAST:  [DATE] 5 cc Omnipaque 350

[Series 2: routine with · axial · 0.95mm/px · z∈[-1066,-591]mm · 13 of 107 slices shown, 15 images]
[im 6/107  soft-tissue]
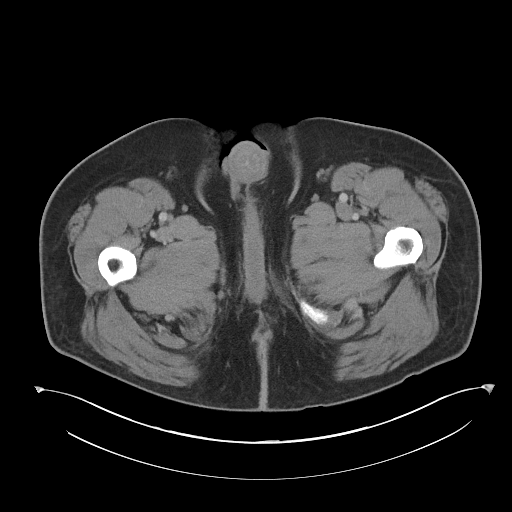
[im 6/107  bone]
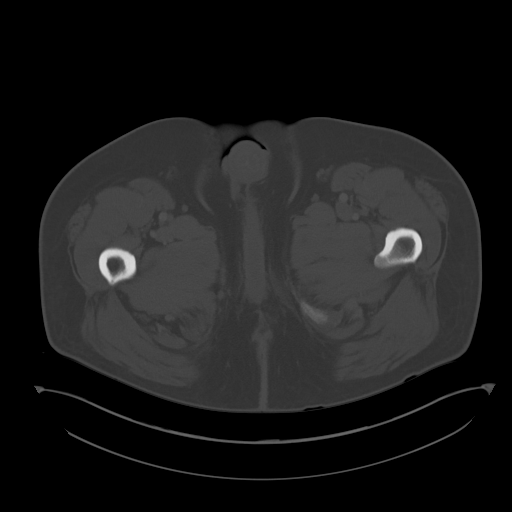
[im 17/107  soft-tissue]
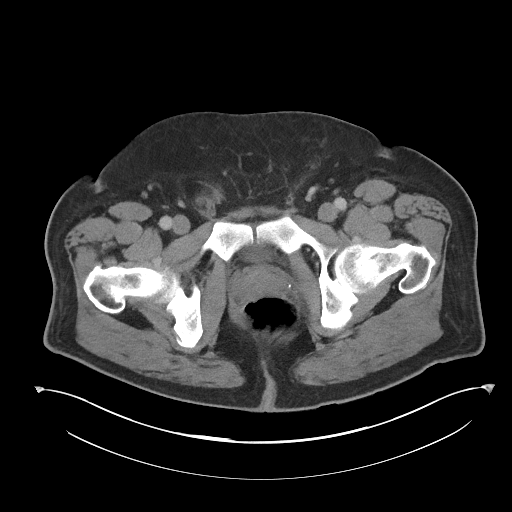
[im 23/107  soft-tissue]
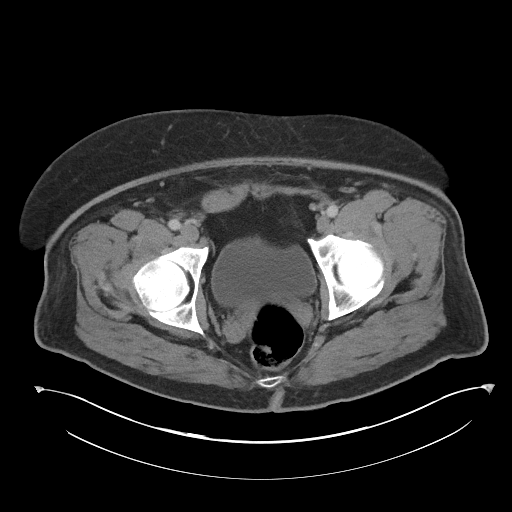
[im 28/107  soft-tissue]
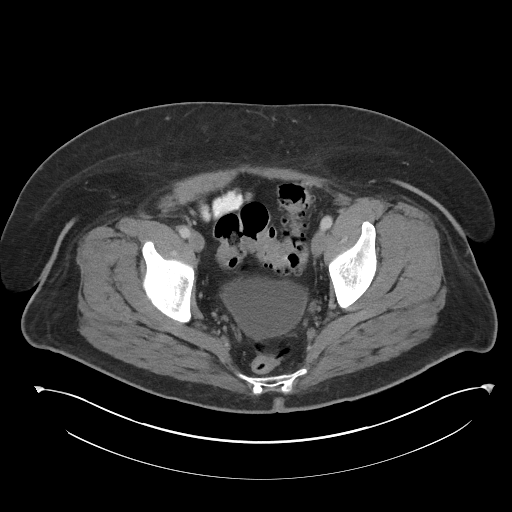
[im 40/107  soft-tissue]
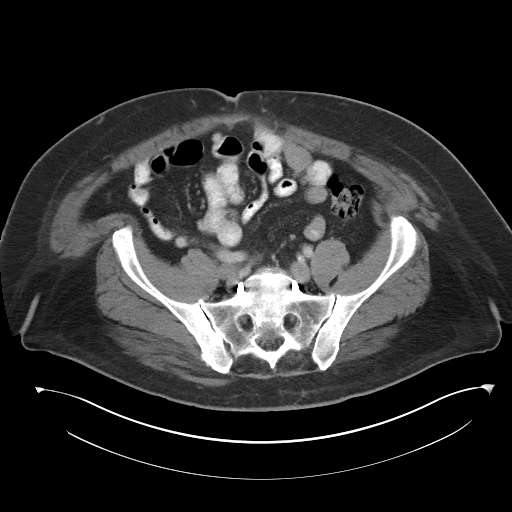
[im 45/107  soft-tissue]
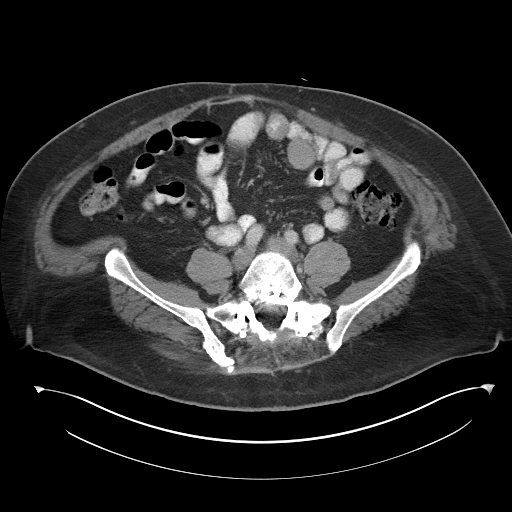
[im 56/107  soft-tissue]
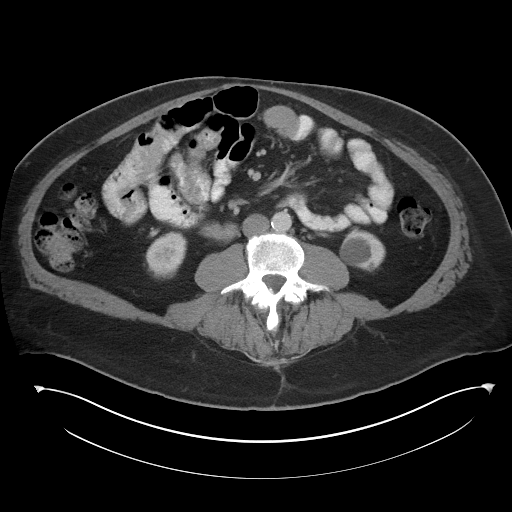
[im 62/107  soft-tissue]
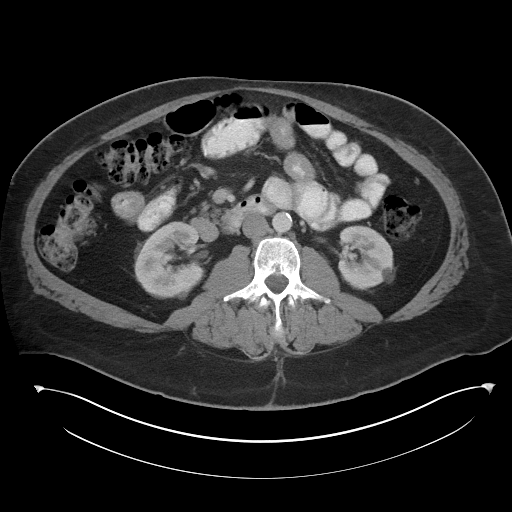
[im 67/107  soft-tissue]
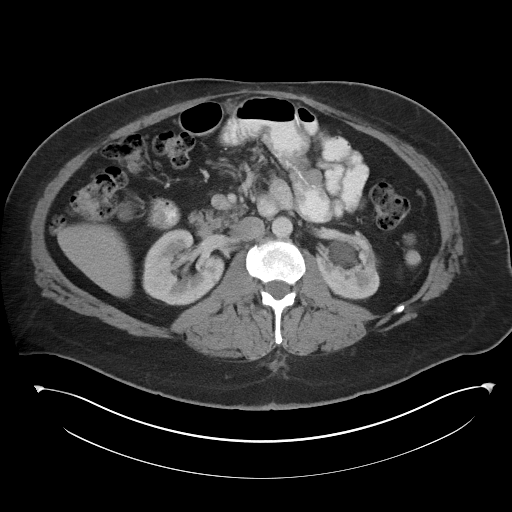
[im 67/107  bone]
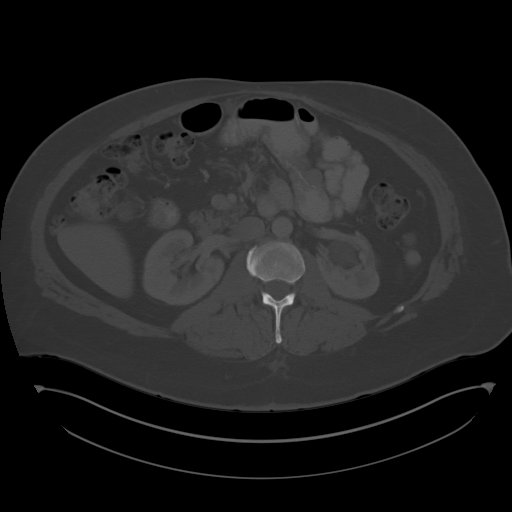
[im 79/107  soft-tissue]
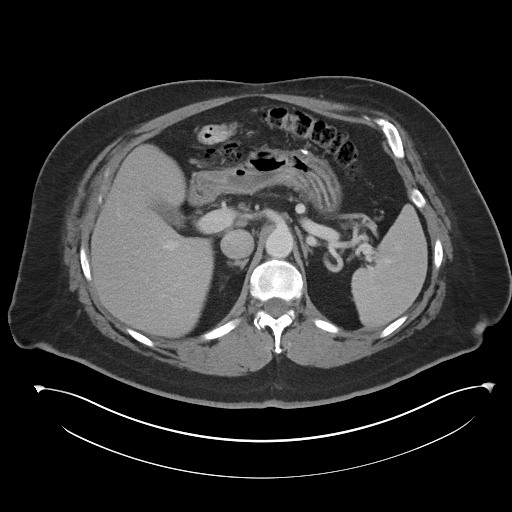
[im 84/107  soft-tissue]
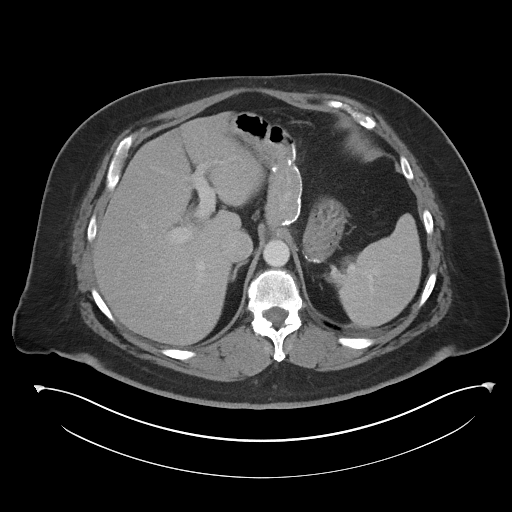
[im 90/107  soft-tissue]
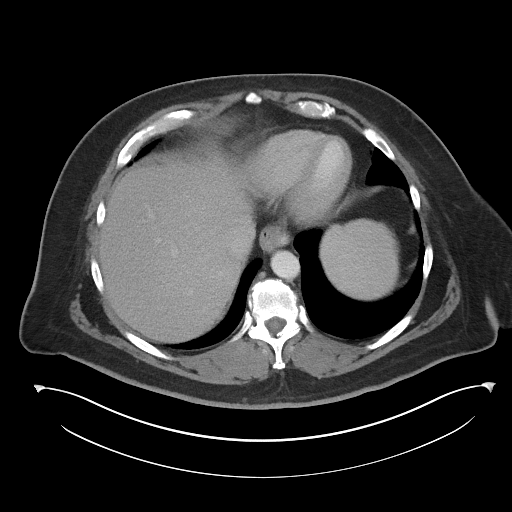
[im 101/107  soft-tissue]
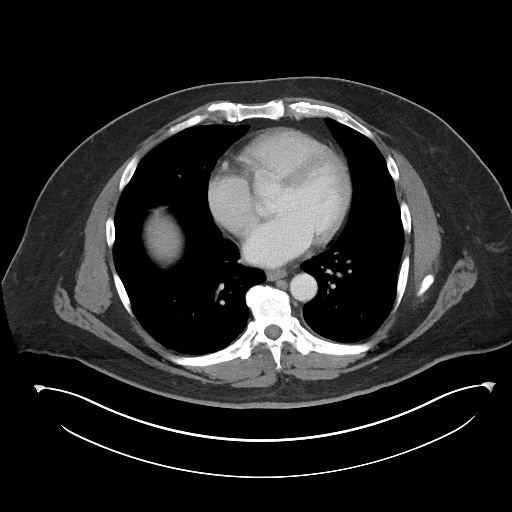

[Series 6: cor routine with · coronal · 1.00mm/px · 3 of 167 slices shown]
[im 56/167  soft-tissue]
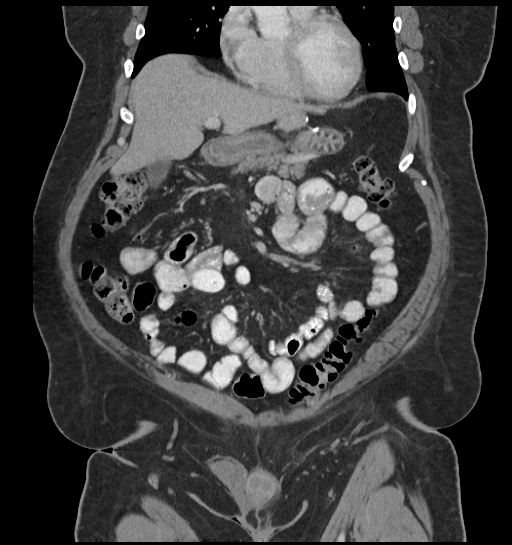
[im 74/167  soft-tissue]
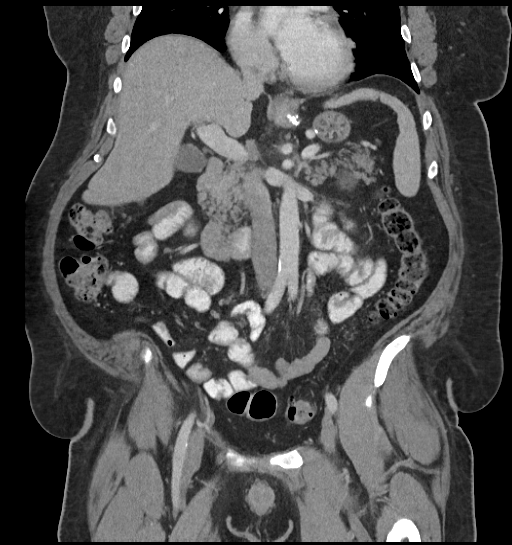
[im 93/167  soft-tissue]
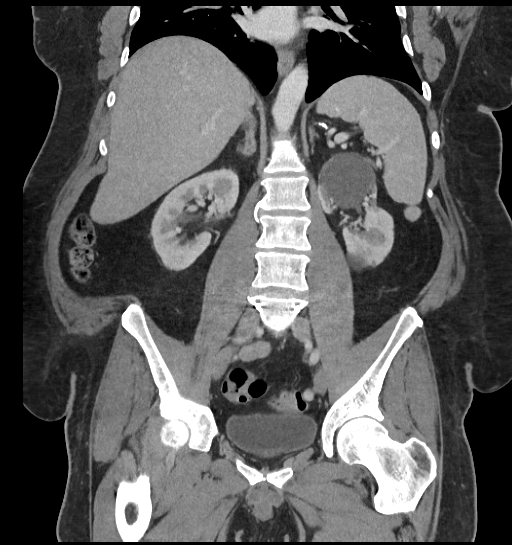

[16 of 46 positions shown; findings below may reference images not displayed]

FINDINGS: Lower chest: Clear lung bases. Normal heart size without pericardial
or pleural effusion. Small hiatal hernia.

Hepatobiliary: Hepatomegaly, 19 cm craniocaudal. Possible gallstones
or sludge on image 39. No biliary ductal dilatation.

Pancreas: Normal, without mass or ductal dilatation.

Spleen: Splenules.

Adrenals/Urinary Tract: Normal adrenal glands. Too small to
characterize lesions in both kidneys. Mild scarring involving the
upper pole left kidney. Dominant cysts in the upper pole left kidney
measures 5.6 cm. There is also a lower pole left renal cyst.

No hydronephrosis.  Normal ureters and urinary bladder.

Stomach/Bowel: Surgical changes of Roux-en-Y gastric bypass. Normal
appearance of the bypass stomach, without contrast within to suggest
gastric gastric fistula. Extensive colonic diverticulosis. Normal
terminal ileum and appendix.

Normal caliber of the Roux loop. Small bowel is otherwise
unremarkable.

Vascular/Lymphatic: Mild but age advanced aortic and branch vessel
atherosclerosis. No abdominopelvic adenopathy.

Reproductive: Normal prostate. Left orchiectomy. low-density in the
right inguinal canal, including on image 95.

Other: No significant free fluid. The palpable abnormality is
identified by a vitamin-E tablet on image 45 in the low anterior
left abdomen. There is no underlying mass or hernia identified.

Musculoskeletal: L5-S1 fixation.
IMPRESSION: 1.  No acute process in the abdomen or pelvis.
2. Status post Roux-en-Y gastric bypass, without acute complication.
3. Small hiatal hernia.
4. Age advanced atherosclerosis.
5. Left orchiectomy. Low density in the right inguinal canal could
represent the right testicle or a small amount of fluid. Recommend
physical exam correlation.
6. No correlate for the palpable abnormality within the left side of
the abdomen.
7. Possible cholelithiasis or gallbladder sludge.

## 2016-07-16 ENCOUNTER — Other Ambulatory Visit: Payer: Self-pay | Admitting: Family Medicine

## 2016-09-12 ENCOUNTER — Other Ambulatory Visit: Payer: Self-pay | Admitting: Family Medicine

## 2016-09-12 DIAGNOSIS — R252 Cramp and spasm: Secondary | ICD-10-CM

## 2016-09-12 DIAGNOSIS — E559 Vitamin D deficiency, unspecified: Secondary | ICD-10-CM

## 2016-09-13 ENCOUNTER — Ambulatory Visit (INDEPENDENT_AMBULATORY_CARE_PROVIDER_SITE_OTHER): Payer: Self-pay | Admitting: Family Medicine

## 2016-09-13 ENCOUNTER — Encounter: Payer: Self-pay | Admitting: Family Medicine

## 2016-09-13 VITALS — BP 118/84 | HR 89 | Temp 98.0°F | Resp 18 | Ht 69.0 in | Wt 304.5 lb

## 2016-09-13 DIAGNOSIS — G8929 Other chronic pain: Secondary | ICD-10-CM

## 2016-09-13 DIAGNOSIS — I10 Essential (primary) hypertension: Secondary | ICD-10-CM

## 2016-09-13 DIAGNOSIS — M549 Dorsalgia, unspecified: Secondary | ICD-10-CM

## 2016-09-13 DIAGNOSIS — D649 Anemia, unspecified: Secondary | ICD-10-CM

## 2016-09-13 DIAGNOSIS — E785 Hyperlipidemia, unspecified: Secondary | ICD-10-CM

## 2016-09-13 DIAGNOSIS — E538 Deficiency of other specified B group vitamins: Secondary | ICD-10-CM

## 2016-09-13 DIAGNOSIS — Z9884 Bariatric surgery status: Secondary | ICD-10-CM

## 2016-09-13 DIAGNOSIS — M1712 Unilateral primary osteoarthritis, left knee: Secondary | ICD-10-CM

## 2016-09-13 DIAGNOSIS — E559 Vitamin D deficiency, unspecified: Secondary | ICD-10-CM

## 2016-09-13 DIAGNOSIS — R252 Cramp and spasm: Secondary | ICD-10-CM

## 2016-09-13 DIAGNOSIS — E039 Hypothyroidism, unspecified: Secondary | ICD-10-CM

## 2016-09-13 LAB — IRON,TIBC AND FERRITIN PANEL
%SAT: 19 % (ref 15–60)
Ferritin: 22 ng/mL (ref 20–380)
Iron: 53 ug/dL (ref 50–180)
TIBC: 280 ug/dL (ref 250–425)

## 2016-09-13 LAB — HEMOGLOBIN AND HEMATOCRIT, BLOOD
HCT: 43.3 % (ref 38.5–50.0)
Hemoglobin: 14.1 g/dL (ref 13.2–17.1)

## 2016-09-13 MED ORDER — HYDROCODONE-ACETAMINOPHEN 7.5-325 MG PO TABS: 1.0000 | ORAL_TABLET | Freq: Four times a day (QID) | ORAL | 0 refills | Status: DC | PRN

## 2016-09-13 MED ORDER — DICLOFENAC SODIUM 1 % TD GEL: 4.0000 g | Freq: Four times a day (QID) | TRANSDERMAL | 1 refills | Status: DC

## 2016-09-13 NOTE — Progress Notes (Signed)
Name: [name]   MRN: [id number]    DOB: [birth year]   Date:09/13/2016       Progress Note  Subjective  Chief Complaint  Chief Complaint  Patient presents with  . Medication Refill    FU    HPI  Insomnia: he was not able to sleep, but he has resumed taking some Cyclobenzaprine 10 mg for muscle spasms and has been sleeping over the past week  Muscle Cramps: he states symptoms have improved with B12, D and calcium supplementation   Hypothyroidism: taking same dose of Synthroid for many months. He has chronic dry skin, no hair loss, no constipation.   Chronic low back pain : he is taking Norco 7.5/325mg  during the winter he needs to take more often but at this time, pain is controlled with at most three pills daily, he denies misuse or selling. Last rx lasted over 3 months Taking Lyrica but insurance is only paying for twice daily. . No longer under disability , working full time and has new insurance. Pain contract singed 02/05/2016  OA: he still has bilateral knee pain, but left knee is worse , uses Voltaren topically and needs a refill and also takes hydrodocodone. He states left knee pain is described as aching at times sharp, daily symptoms it also cracks and pops.  Neuropathy: he has bilateral daily pain on both legs, aching like, he denies burning or prickly sensation, Lyrica helps.Current pain of 5/10  History of bariatric surgery: he is morbidly obese - lowest weight after surgery was 275 lbs, but is gradually gaining it back. He has gained 12 lbs before last visit, but is down 10 lbs since last visit, now up to 7 lbs ( but he said he did not empty his pockets out). Marland Kitchen He is now packing his lunch instead of eating out.  He is drinking sweet tea only a few times month now.   HTN: off medication now, we will monitor bp, still at goal, no chest pain or palpitation  Anemia: found on his last labs, he has been taking ferrous sulfate once a day, denies blood in stools or  change in appetite. He had bariatric surgery we will add iron studies and if still low , check stools for blood    Patient Active Problem List   Diagnosis Date Noted  . Anemia, unspecified 06/12/2016  . B12 deficiency 06/11/2016  . Allergic conjunctivitis 06/11/2016  . Hypocalcemia 06/11/2016  . Muscle cramps 06/11/2016  . Hypertension, benign 11/03/2015  . Dyslipidemia 11/02/2015  . Acquired nystagmus 08/01/2015  . Chronic back pain 06/29/2015  . Carpal tunnel syndrome 06/29/2015  . Osteoarthritis 06/29/2015  . Claustrophobia 06/29/2015  . Foot drop, left 06/29/2015  . Hiatal hernia 06/29/2015  . H/O diabetes mellitus 06/29/2015  . H/O testicular cancer 06/29/2015  . Acquired hypothyroidism 06/29/2015  . Dysmetabolic syndrome 123XX123  . Morbid obesity ([place]) 06/29/2015  . Allergic rhinitis 06/29/2015  . Neuropathy ([place]) 06/29/2015  . Seborrheic keratoses 06/29/2015  . Vitamin D deficiency 06/29/2015  . Bariatric surgery status 02/20/2012    Past Surgical History:  Procedure Laterality Date  . BACK SURGERY     X 2  . HERNIA REPAIR     left side and umbilical hernia repair  . 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 URETEROTOMY Left 2000    Family History  Problem Relation Age of Onset  . Coronary artery disease Mother   . Cancer Father     Melanoma and Testicular  .  Diabetes Father     Social History   Social History  . Marital status: Married    Spouse name: N/A  . Number of children: N/A  . Years of education: N/A   Occupational History  . Not on file.   Social History Main Topics  . Smoking status: Former Smoker    Packs/day: 3.00    Years: 8.00    Types: Cigarettes, Cigars, Pipe    Start date: 06/28/1984    Quit date: 12/24/1991  . Smokeless tobacco: Former Systems developer    Types: Chew    Quit date: 12/24/1991  . Alcohol use No  . Drug use: No  . Sexual activity: Yes    Partners: Female   Other Topics Concern  . Not on file   Social History Narrative   . No narrative on file     Current Outpatient Prescriptions:  .  azelastine (OPTIVAR) 0.05 % ophthalmic solution, Place 1 drop into both eyes 2 (two) times daily., Disp: 6 mL, Rfl: 2 .  Calcium Carbonate-Vit D-Min (CALTRATE 600+D PLUS MINERALS) 600-800 MG-UNIT CHEW, Chew by mouth., Disp: , Rfl:  .  cyclobenzaprine (FLEXERIL) 10 MG tablet, TAKE ONE TABLET AT BEDTIME, Disp: 30 tablet, Rfl: 2 .  diclofenac sodium (VOLTAREN) 1 % GEL, Apply 4 g topically 4 (four) times daily. Reported on 06/11/2016, Disp: 100 g, Rfl: 1 .  diphenhydramine-acetaminophen (TYLENOL PM) 25-500 MG TABS tablet, Take 4 tablets by mouth at bedtime as needed., Disp: , Rfl:  .  fexofenadine (ALLEGRA ALLERGY) 180 MG tablet, Take 1 tablet by mouth daily., Disp: , Rfl:  .  Homeopathic Products ([employer] LEG CRAMPS PAIN RELIEF PO), Take 2 tablets by mouth 2 (two) times daily at 10 am and 4 pm., Disp: , Rfl:  .  HYDROcodone-acetaminophen (NORCO) 7.5-325 MG tablet, Take 1 tablet by mouth every 6 (six) hours as needed for moderate pain., Disp: 100 tablet, Rfl: 0 .  HYDROcodone-acetaminophen (NORCO) 7.5-325 MG tablet, Take 1 tablet by mouth 4 (four) times daily as needed., Disp: 100 tablet, Rfl: 0 .  HYDROcodone-acetaminophen (NORCO) 7.5-325 MG tablet, Take 1 tablet by mouth 4 (four) times daily as needed for moderate pain., Disp: 100 tablet, Rfl: 0 .  Potassium 99 MG TABS, Take by mouth., Disp: , Rfl:  .  pregabalin (LYRICA) 225 MG capsule, Take 1 capsule (225 mg total) by mouth 2 (two) times daily., Disp: 60 capsule, Rfl: 5 .  SYNTHROID 137 MCG tablet, TAKE 1 TABLET EVERY DAY AND 1 AND 1/2 TABLET ON SUNDAYS, Disp: 90 tablet, Rfl: 1 .  Vitamin D, Ergocalciferol, (DRISDOL) 50000 units CAPS capsule, TAKE ONE CAPSULE BY MOUTH EVERY SEVEN DAYS, Disp: 12 capsule, Rfl: 0  Allergies  Allergen Reactions  . Morphine Itching     ROS  Constitutional: Negative for fever, positive weight change.  Respiratory: Negative for cough and shortness of  breath.   Cardiovascular: Negative for chest pain or palpitations.  Gastrointestinal: Negative for abdominal pain, no bowel changes.  Musculoskeletal: Positive  for gait problem or joint swelling.  Skin: Negative for rash.  Neurological: Negative for dizziness or headache.  No other specific complaints in a complete review of systems (except as listed in HPI above).   Objective  Vitals:   09/13/16 1434  BP: 118/84  Pulse: 89  Resp: 18  Temp: 98 F (36.7 C)  TempSrc: Oral  SpO2: 97%  Weight: (!) 304 lb 8 oz (138.1 kg)  Height: 5\' 9"  (1.753 m)    Body  mass index is 44.97 kg/m.  Physical Exam  Constitutional: Patient appears well-developed. Obese No distress.  HEENT: head atraumatic, normocephalic, pupils equal and reactive to light, nsytagmus, neck supple, throat within normal limits Cardiovascular: Normal rate, regular rhythm and normal heart sounds. No murmur heard. No BLE edema. Pulmonary/Chest: Effort normal and breath sounds normal. No respiratory distress. Abdominal: Soft. There is no tenderness. Psychiatric: Patient has a normal mood and affect. behavior is normal. Judgment and thought content normal. Muscular skeletal: pain during palpation of lumbar spine, negative straight leg raise   PHQ2/9:  Depression screen [facility] 2/9 09/13/2016 06/11/2016 02/05/2016 11/02/2015 06/29/2015  Decreased Interest 0 0 0 0 0  Down, Depressed, Hopeless 0 0 0 0 0  PHQ - 2 Score 0 0 0 0 0    Fall Risk: Fall Risk  09/13/2016 06/11/2016 02/05/2016 11/02/2015 06/29/2015  Falls in the past year? No Yes No No No  Number falls in past yr: - 1 - - -  Injury with Fall? - No - - -    Functional Status Survey: Is the patient deaf or have difficulty hearing?: No Does the patient have difficulty seeing, even when wearing glasses/contacts?: No Does the patient have difficulty concentrating, remembering, or making decisions?: No Does the patient have difficulty walking or climbing stairs?: No Does  the patient have difficulty dressing or bathing?: No Does the patient have difficulty doing errands alone such as visiting a doctor's office or shopping?: No    Assessment & Plan  1. Chronic back pain  - HYDROcodone-acetaminophen (NORCO) 7.5-325 MG tablet; Take 1 tablet by mouth every 6 (six) hours as needed for moderate pain.  Dispense: 100 tablet; Refill: 0 - HYDROcodone-acetaminophen (NORCO) 7.5-325 MG tablet; Take 1 tablet by mouth 4 (four) times daily as needed.  Dispense: 100 tablet; Refill: 0 - HYDROcodone-acetaminophen (NORCO) 7.5-325 MG tablet; Take 1 tablet by mouth 4 (four) times daily as needed for moderate pain.  Dispense: 100 tablet; Refill: 0  2. Hypertension, benign  controlled  3. Vitamin D deficiency  Continue supplementation   4. Dyslipidemia  Improved since bariatric surgery   5. Acquired hypothyroidism  Continue levothyroxine   6. Bariatric surgery status  Continue supplementation   7. Muscle cramps  Doing better  8. B12 deficiency  Continue supplementation   9. Anemia, unspecified  - Hemoglobin and hematocrit, blood - Iron, TIBC and Ferritin Panel  10. Primary osteoarthritis of left knee  - diclofenac sodium (VOLTAREN) 1 % GEL; Apply 4 g topically 4 (four) times daily. Reported on 06/11/2016  Dispense: 100 g; Refill: 1

## 2016-11-13 ENCOUNTER — Other Ambulatory Visit: Payer: Self-pay | Admitting: Family Medicine

## 2016-11-13 DIAGNOSIS — R252 Cramp and spasm: Secondary | ICD-10-CM

## 2016-11-13 DIAGNOSIS — E559 Vitamin D deficiency, unspecified: Secondary | ICD-10-CM

## 2016-12-12 ENCOUNTER — Other Ambulatory Visit: Payer: Self-pay | Admitting: Family Medicine

## 2016-12-12 DIAGNOSIS — G8929 Other chronic pain: Secondary | ICD-10-CM

## 2016-12-12 DIAGNOSIS — M549 Dorsalgia, unspecified: Principal | ICD-10-CM

## 2016-12-13 ENCOUNTER — Telehealth: Payer: Self-pay | Admitting: Family Medicine

## 2016-12-13 NOTE — Telephone Encounter (Signed)
[name] (wife) states that [employer] informed her that we can not fax over the Lyrica prescription. Patient is asking that you please print out a hard copy to pick up.

## 2016-12-13 NOTE — Telephone Encounter (Signed)
In file cabinet

## 2016-12-13 NOTE — Telephone Encounter (Signed)
Left voice message.

## 2016-12-27 ENCOUNTER — Ambulatory Visit: Payer: Self-pay | Admitting: Family Medicine

## 2017-02-11 ENCOUNTER — Ambulatory Visit (INDEPENDENT_AMBULATORY_CARE_PROVIDER_SITE_OTHER): Payer: BLUE CROSS/BLUE SHIELD | Admitting: Family Medicine

## 2017-02-11 ENCOUNTER — Encounter: Payer: Self-pay | Admitting: Family Medicine

## 2017-02-11 VITALS — BP 148/96 | HR 77 | Temp 98.1°F | Resp 18 | Ht 69.0 in | Wt 334.8 lb

## 2017-02-11 DIAGNOSIS — G8929 Other chronic pain: Secondary | ICD-10-CM | POA: Diagnosis not present

## 2017-02-11 DIAGNOSIS — F40240 Claustrophobia: Secondary | ICD-10-CM

## 2017-02-11 DIAGNOSIS — G4709 Other insomnia: Secondary | ICD-10-CM

## 2017-02-11 DIAGNOSIS — Z9884 Bariatric surgery status: Secondary | ICD-10-CM | POA: Diagnosis not present

## 2017-02-11 DIAGNOSIS — E039 Hypothyroidism, unspecified: Secondary | ICD-10-CM | POA: Diagnosis not present

## 2017-02-11 DIAGNOSIS — M5440 Lumbago with sciatica, unspecified side: Secondary | ICD-10-CM

## 2017-02-11 DIAGNOSIS — Z23 Encounter for immunization: Secondary | ICD-10-CM

## 2017-02-11 DIAGNOSIS — I10 Essential (primary) hypertension: Secondary | ICD-10-CM | POA: Diagnosis not present

## 2017-02-11 DIAGNOSIS — E538 Deficiency of other specified B group vitamins: Secondary | ICD-10-CM | POA: Diagnosis not present

## 2017-02-11 DIAGNOSIS — G629 Polyneuropathy, unspecified: Secondary | ICD-10-CM

## 2017-02-11 DIAGNOSIS — E785 Hyperlipidemia, unspecified: Secondary | ICD-10-CM | POA: Diagnosis not present

## 2017-02-11 MED ORDER — HYDROCODONE-ACETAMINOPHEN 7.5-325 MG PO TABS: 1.0000 | ORAL_TABLET | Freq: Three times a day (TID) | ORAL | 0 refills | Status: DC | PRN

## 2017-02-11 MED ORDER — TRAZODONE HCL 50 MG PO TABS: 25.0000 mg | ORAL_TABLET | Freq: Every evening | ORAL | 0 refills | Status: DC | PRN

## 2017-02-11 MED ORDER — PREGABALIN 225 MG PO CAPS: 225.0000 mg | ORAL_CAPSULE | Freq: Two times a day (BID) | ORAL | 2 refills | Status: DC

## 2017-02-11 MED ORDER — SYNTHROID 137 MCG PO TABS: ORAL_TABLET | ORAL | 1 refills | Status: DC

## 2017-02-11 NOTE — Progress Notes (Signed)
Name: [name]   MRN: [id number]    DOB: 20-Nov-1971   Date:02/11/2017       Progress Note  Subjective  Chief Complaint  Chief Complaint  Patient presents with  . Medication Refill    4 month F/U  . Back Pain    Unchanged  . Hypertension    Has gained 30 pounds since last visit  . Insomnia    Only sleeping 3-4 hours daily, goes to bed at 11 or midnight and wakes up every day at 3 a.m.  . Anemia    Taking Iron daily  . Hypothyroidism    Dry Face  . Allergic Rhinitis     Feels like he is starting to get a cold     HPI  Insomnia: he still has problems sleeping, he used to respond to Flexeril, but he states not helping him anymore. He falls asleep around 10 or 11 and wakes up around 3 am and is unable to fall back asleep. Discussed adding Trazodone and he is willing. He wakes up because of pain at times.   Muscle Cramps: he states symptoms have improved with B12, D and calcium supplementation, takes Flexeril prn   Hypothyroidism: taking same dose of Synthroid for many months, last TSH at goal. He has chronic dry skin, no hair loss, no constipation.   Chronic low back pain : he is taking Norco 7.5/325mg  about three times per day, he was supposed to run out of medication one month ago, but 100 tabs per month has lasted, so we will go down to 90 pills per month again.  He denies misuse or selling. Last rx lasted over 3 months Taking Lyrica but insurance is only paying for twice daily. .No longer under disability , working full time and has new insurance. Pain contract singed 02/05/2016 and we will get another one today  OA: he still has bilateral knee pain, but left knee is worse , uses Voltaren topically and needs a refill and also takes hydrodocodone. He states left knee pain is described as aching at times sharp, daily symptoms it also cracks and pops, it is stable and he does not want to see Ortho at this time  Neuropathy: he has bilateral daily pain on both legs, aching  like, he denies burning or prickly sensation, Lyrica helps.Current pain of 6/10  History of bariatric surgery: he is morbidly obese - lowest weight after surgery was 275 lbs, but is gradually gaining it back. He has gained 30 more pounds since last visit, but his weight goes up and down. He is now packing his lunch instead of eating out. He has not been drinking sweet tea but is drinking 20 ounces of sodas per day. Advised him to stop drinking sweet drinks.   HTN: off medication now, bp is very high. He denies chest pain or palpitatoin   Anemia:resolved with iron supplementation   Patient Active Problem List   Diagnosis Date Noted  . Anemia, unspecified 06/12/2016  . B12 deficiency 06/11/2016  . Allergic conjunctivitis 06/11/2016  . Hypocalcemia 06/11/2016  . Muscle cramps 06/11/2016  . Hypertension, benign 11/03/2015  . Dyslipidemia 11/02/2015  . Acquired nystagmus 08/01/2015  . Chronic back pain 06/29/2015  . Carpal tunnel syndrome 06/29/2015  . Osteoarthritis 06/29/2015  . Claustrophobia 06/29/2015  . Foot drop, left 06/29/2015  . Hiatal hernia 06/29/2015  . H/O diabetes mellitus 06/29/2015  . H/O testicular cancer 06/29/2015  . Acquired hypothyroidism 06/29/2015  . Dysmetabolic syndrome 123XX123  .  Morbid obesity ([place]) 06/29/2015  . Allergic rhinitis 06/29/2015  . Neuropathy ([place]) 06/29/2015  . Seborrheic keratoses 06/29/2015  . Vitamin D deficiency 06/29/2015  . Bariatric surgery status 02/20/2012    Past Surgical History:  Procedure Laterality Date  . BACK SURGERY     X 2  . HERNIA REPAIR     left side and umbilical hernia repair  . 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 URETEROTOMY Left 2000    Family History  Problem Relation Age of Onset  . Coronary artery disease Mother   . Cancer Father     Melanoma and Testicular  . Diabetes Father     Social History   Social History  . Marital status: Married    Spouse name: N/A  . Number of children: N/A   . Years of education: N/A   Occupational History  . Not on file.   Social History Main Topics  . Smoking status: Former Smoker    Packs/day: 3.00    Years: 8.00    Types: Cigarettes, Cigars, Pipe    Start date: 06/28/1984    Quit date: 12/24/1991  . Smokeless tobacco: Former Systems developer    Types: Chew    Quit date: 12/24/1991  . Alcohol use No  . Drug use: No  . Sexual activity: Yes    Partners: Female   Other Topics Concern  . Not on file   Social History Narrative  . No narrative on file     Current Outpatient Prescriptions:  .  Calcium Carbonate-Vit D-Min (CALTRATE 600+D PLUS MINERALS) 600-800 MG-UNIT CHEW, Chew by mouth., Disp: , Rfl:  .  cyclobenzaprine (FLEXERIL) 10 MG tablet, TAKE ONE TABLET AT BEDTIME, Disp: 30 tablet, Rfl: 5 .  diclofenac sodium (VOLTAREN) 1 % GEL, Apply 4 g topically 4 (four) times daily. Reported on 06/11/2016, Disp: 100 g, Rfl: 1 .  diphenhydramine-acetaminophen (TYLENOL PM) 25-500 MG TABS tablet, Take 4 tablets by mouth at bedtime as needed., Disp: , Rfl:  .  Ferrous Sulfate (IRON) 325 (65 Fe) MG TABS, Take 1 tablet by mouth daily., Disp: , Rfl:  .  fexofenadine (ALLEGRA ALLERGY) 180 MG tablet, Take 1 tablet by mouth daily., Disp: , Rfl:  .  Homeopathic Products ([employer] LEG CRAMPS PAIN RELIEF PO), Take 2 tablets by mouth 2 (two) times daily at 10 am and 4 pm., Disp: , Rfl:  .  HYDROcodone-acetaminophen (NORCO) 7.5-325 MG tablet, Take 1 tablet by mouth 3 (three) times daily as needed for moderate pain., Disp: 90 tablet, Rfl: 0 .  HYDROcodone-acetaminophen (NORCO) 7.5-325 MG tablet, Take 1 tablet by mouth 3 (three) times daily as needed., Disp: 90 tablet, Rfl: 0 .  HYDROcodone-acetaminophen (NORCO) 7.5-325 MG tablet, Take 1 tablet by mouth 3 (three) times daily as needed for moderate pain. Fill April 20th, 2018, Disp: 90 tablet, Rfl: 0 .  Potassium 99 MG TABS, Take by mouth., Disp: , Rfl:  .  pregabalin (LYRICA) 225 MG capsule, Take 1 capsule (225 mg total) by  mouth 2 (two) times daily., Disp: 60 capsule, Rfl: 2 .  SYNTHROID 137 MCG tablet, TAKE 1 TABLET EVERY DAY AND 1 AND 1/2 TABLET ON SUNDAYS, Disp: 90 tablet, Rfl: 1 .  Vitamin D, Ergocalciferol, (DRISDOL) 50000 units CAPS capsule, TAKE ONE CAPSULE BY MOUTH EVERY SEVEN DAYS, Disp: 12 capsule, Rfl: 0  Allergies  Allergen Reactions  . Morphine Itching     ROS  Constitutional: Negative for fever, positive for  weight change.  Respiratory: Negative for  cough and shortness of breath.   Cardiovascular: Negative for chest pain or palpitations.  Gastrointestinal: Negative for abdominal pain, no bowel changes.  Musculoskeletal: Positive for gait problem but no joint swelling.  Skin: Negative for rash.  Neurological: Negative for dizziness or headache.  No other specific complaints in a complete review of systems (except as listed in HPI above).  Objective  Vitals:   02/11/17 1347  BP: (!) 148/96  Pulse: 77  Resp: 18  Temp: 98.1 F (36.7 C)  TempSrc: Oral  SpO2: 99%  Weight: (!) 334 lb 12.8 oz (151.9 kg)  Height: 5\' 9"  (1.753 m)    Body mass index is 49.44 kg/m.  Physical Exam  Constitutional: Patient appears well-developed. Obese No distress.  HEENT: head atraumatic, normocephalic, pupils equal and reactive to light, nsytagmus, neck supple, throat within normal limits Cardiovascular: Normal rate, regular rhythm and normal heart sounds. No murmur heard. No BLE edema. Pulmonary/Chest: Effort normal and breath sounds normal. No respiratory distress. Abdominal: Soft. There is no tenderness. Psychiatric: Patient has a normal mood and affect. behavior is normal. Judgment and thought content normal. Muscular skeletal: pain during palpation of lumbar spine, negative straight leg raise   PHQ2/9: Depression screen [facility] 2/9 02/11/2017 09/13/2016 06/11/2016 02/05/2016 11/02/2015  Decreased Interest 0 0 0 0 0  Down, Depressed, Hopeless 0 0 0 0 0  PHQ - 2 Score 0 0 0 0 0    Fall  Risk: Fall Risk  02/11/2017 09/13/2016 06/11/2016 02/05/2016 11/02/2015  Falls in the past year? No No Yes No No  Number falls in past yr: - - 1 - -  Injury with Fall? - - No - -     Functional Status Survey: Is the patient deaf or have difficulty hearing?: No Does the patient have difficulty seeing, even when wearing glasses/contacts?: No Does the patient have difficulty concentrating, remembering, or making decisions?: No Does the patient have difficulty walking or climbing stairs?: No Does the patient have difficulty dressing or bathing?: No Does the patient have difficulty doing errands alone such as visiting a doctor's office or shopping?: No  Refused flu   Assessment & Plan  1. Chronic nonmalignant pain  Pain contract and drug screen done today  2. Acquired hypothyroidism  - SYNTHROID 137 MCG tablet; TAKE 1 TABLET EVERY DAY AND 1 AND 1/2 TABLET ON SUNDAYS  Dispense: 90 tablet; Refill: 1  3. Bariatric surgery status  Patient has gained a lot of weight since last visit, explained importance of life style modification   4. B12 deficiency  Continue supplementation   5. Claustrophobia  Does not wear seat belt  6. Dyslipidemia  Recheck yearly, improved with bariatric surgery   7. Neuropathy ([place])  From back   8. Chronic midline low back pain with sciatica, sciatica laterality unspecified  - HYDROcodone-acetaminophen (NORCO) 7.5-325 MG tablet; Take 1 tablet by mouth 3 (three) times daily as needed for moderate pain.  Dispense: 90 tablet; Refill: 0 - HYDROcodone-acetaminophen (NORCO) 7.5-325 MG tablet; Take 1 tablet by mouth 3 (three) times daily as needed.  Dispense: 90 tablet; Refill: 0 - HYDROcodone-acetaminophen (NORCO) 7.5-325 MG tablet; Take 1 tablet by mouth 3 (three) times daily as needed for moderate pain. Fill April 20th, 2018  Dispense: 90 tablet; Refill:0 - pregabalin (LYRICA) 225 MG capsule; Take 1 capsule (225 mg total) by mouth 2 (two) times daily.   Dispense: 60 capsule; Refill: 2   9. Hypertension, benign  bp is elevated, advised to resume bp medication  but he states he wants to hold off for now, and will monitor at home  10. Needs flu shot  refused  11. Other insomnia  - traZODone (DESYREL) 50 MG tablet; Take 0.5-2 tablets (25-100 mg total) by mouth at bedtime as needed for sleep.  Dispense: 90 tablet; Refill: 0

## 2017-03-10 ENCOUNTER — Other Ambulatory Visit: Payer: Self-pay | Admitting: Family Medicine

## 2017-03-10 DIAGNOSIS — E559 Vitamin D deficiency, unspecified: Secondary | ICD-10-CM

## 2017-03-10 DIAGNOSIS — G4709 Other insomnia: Secondary | ICD-10-CM

## 2017-03-10 NOTE — Telephone Encounter (Signed)
Patient requesting refill of Trazodone and Vitamin D to [facility].

## 2017-03-15 ENCOUNTER — Other Ambulatory Visit: Payer: Self-pay | Admitting: Family Medicine

## 2017-03-15 DIAGNOSIS — G4709 Other insomnia: Secondary | ICD-10-CM

## 2017-04-09 ENCOUNTER — Ambulatory Visit (INDEPENDENT_AMBULATORY_CARE_PROVIDER_SITE_OTHER): Payer: BLUE CROSS/BLUE SHIELD | Admitting: Family Medicine

## 2017-04-09 ENCOUNTER — Encounter: Payer: Self-pay | Admitting: Family Medicine

## 2017-04-09 VITALS — BP 118/68 | HR 94 | Temp 97.8°F | Resp 16 | Ht 69.0 in | Wt 334.6 lb

## 2017-04-09 DIAGNOSIS — G4709 Other insomnia: Secondary | ICD-10-CM | POA: Diagnosis not present

## 2017-04-09 DIAGNOSIS — G8929 Other chronic pain: Secondary | ICD-10-CM | POA: Diagnosis not present

## 2017-04-09 NOTE — Progress Notes (Signed)
Name: [name]   MRN: [id number]    DOB: 1971-05-22   Date:04/09/2017       Progress Note  Subjective  Chief Complaint  Chief Complaint  Patient presents with  . Pain  . Medication Refill  . Hypothyroidism  . Insomnia    HPI  Insomnia: he is doing better since started on Trazodone 01/2017, going to bed between around 10 pm, and waking up around 4-5 am- he needs to be ready for work at 6 am. He sleeps longer on weekend. He denies side effects such as feeling groggy or dry mouth  Chronic low back pain : he is taking Norco 7.5/325mg  from 2-3 times daily, he is down to 90 pills per month, but symptoms tend to improve in warm months and when he return for follow up we will try going down to 80 pills per month.  He denies misuse or selling.  Taking Lyrica 225 mg twice daily now. .No longer under disability , working full time . Pain contract singed 2//2018 and normal drug screen test, with no illegal drugs and positive for hydrocodone and also Lyrica.  Patient Active Problem List   Diagnosis Date Noted  . Chronic midline low back pain with sciatica 02/11/2017  . Anemia, unspecified 06/12/2016  . B12 deficiency 06/11/2016  . Allergic conjunctivitis 06/11/2016  . Hypocalcemia 06/11/2016  . Muscle cramps 06/11/2016  . Hypertension, benign 11/03/2015  . Dyslipidemia 11/02/2015  . Acquired nystagmus 08/01/2015  . Chronic back pain 06/29/2015  . Carpal tunnel syndrome 06/29/2015  . Osteoarthritis 06/29/2015  . Claustrophobia 06/29/2015  . Foot drop, left 06/29/2015  . Hiatal hernia 06/29/2015  . H/O diabetes mellitus 06/29/2015  . H/O testicular cancer 06/29/2015  . Acquired hypothyroidism 06/29/2015  . Dysmetabolic syndrome 70/35/0093  . Morbid obesity ([place]) 06/29/2015  . Allergic rhinitis 06/29/2015  . Neuropathy 06/29/2015  . Seborrheic keratoses 06/29/2015  . Vitamin D deficiency 06/29/2015  . Bariatric surgery status 02/20/2012    Past Surgical History:  Procedure  Laterality Date  . BACK SURGERY     X 2  . HERNIA REPAIR     left side and umbilical hernia repair  . 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 URETEROTOMY Left 2000    Family History  Problem Relation Age of Onset  . Coronary artery disease Mother   . Cancer Father     Melanoma and Testicular  . Diabetes Father     Social History   Social History  . Marital status: Married    Spouse name: N/A  . Number of children: N/A  . Years of education: N/A   Occupational History  . Not on file.   Social History Main Topics  . Smoking status: Former Smoker    Packs/day: 3.00    Years: 8.00    Types: Cigarettes, Cigars, Pipe    Start date: 06/28/1984    Quit date: 12/24/1991  . Smokeless tobacco: Former Systems developer    Types: Chew    Quit date: 12/24/1991  . Alcohol use No  . Drug use: No  . Sexual activity: Yes    Partners: Female   Other Topics Concern  . Not on file   Social History Narrative  . No narrative on file     Current Outpatient Prescriptions:  .  Calcium Carbonate-Vit D-Min (CALTRATE 600+D PLUS MINERALS) 600-800 MG-UNIT CHEW, Chew by mouth., Disp: , Rfl:  .  cyclobenzaprine (FLEXERIL) 10 MG tablet, TAKE ONE TABLET AT BEDTIME, Disp: 30 tablet,  Rfl: 5 .  diclofenac sodium (VOLTAREN) 1 % GEL, Apply 4 g topically 4 (four) times daily. Reported on 06/11/2016, Disp: 100 g, Rfl: 1 .  diphenhydramine-acetaminophen (TYLENOL PM) 25-500 MG TABS tablet, Take 4 tablets by mouth at bedtime as needed., Disp: , Rfl:  .  Ferrous Sulfate (IRON) 325 (65 Fe) MG TABS, Take 1 tablet by mouth daily., Disp: , Rfl:  .  fexofenadine (ALLEGRA ALLERGY) 180 MG tablet, Take 1 tablet by mouth daily., Disp: , Rfl:  .  Homeopathic Products ([employer] LEG CRAMPS PAIN RELIEF PO), Take 2 tablets by mouth 2 (two) times daily at 10 am and 4 pm., Disp: , Rfl:  .  HYDROcodone-acetaminophen (NORCO) 7.5-325 MG tablet, Take 1 tablet by mouth 3 (three) times daily as needed for moderate pain., Disp: 90 tablet, Rfl: 0 .   HYDROcodone-acetaminophen (NORCO) 7.5-325 MG tablet, Take 1 tablet by mouth 3 (three) times daily as needed., Disp: 90 tablet, Rfl: 0 .  HYDROcodone-acetaminophen (NORCO) 7.5-325 MG tablet, Take 1 tablet by mouth 3 (three) times daily as needed for moderate pain. Fill April 20th, 2018, Disp: 90 tablet, Rfl: 0 .  Potassium 99 MG TABS, Take by mouth., Disp: , Rfl:  .  pregabalin (LYRICA) 225 MG capsule, Take 1 capsule (225 mg total) by mouth 2 (two) times daily., Disp: 60 capsule, Rfl: 2 .  SYNTHROID 137 MCG tablet, TAKE 1 TABLET EVERY DAY AND 1 AND 1/2 TABLET ON SUNDAYS, Disp: 90 tablet, Rfl: 1 .  traZODone (DESYREL) 50 MG tablet, Take one-half to two tablets (25-100 mg total) by mouth at bedtime as needed for sleep., Disp: 90 tablet, Rfl: 1 .  Vitamin D, Ergocalciferol, (DRISDOL) 50000 units CAPS capsule, TAKE ONE CAPSULE EVERY SEVEN DAYS, Disp: 12 capsule, Rfl: 0  Allergies  Allergen Reactions  . Morphine Itching     ROS  Constitutional: Negative for fever or weight change.  Respiratory: Negative for cough and shortness of breath.   Cardiovascular: Negative for chest pain or palpitations.  Gastrointestinal: Negative for abdominal pain, no bowel changes.  Musculoskeletal: Positive  for gait problem or joint swelling.  Skin: Negative for rash.  Neurological: Negative for dizziness or headache.  No other specific complaints in a complete review of systems (except as listed in HPI above).  Objective  Vitals:   04/09/17 1302  BP: 118/68  Pulse: 94  Resp: 16  Temp: 97.8 F (36.6 C)  SpO2: 94%  Weight: (!) 334 lb 9 oz (151.8 kg)  Height: 5\' 9"  (1.753 m)    Body mass index is 49.41 kg/m.  Physical Exam  Constitutional: Patient appears well-developed and well-nourished. Obese  No distress.  HEENT: head atraumatic, normocephalic, pupils equal and reactive to light, strabismus,  neck supple, throat within normal limits Cardiovascular: Normal rate, regular rhythm and normal heart  sounds.  No murmur heard. No BLE edema. Pulmonary/Chest: Effort normal and breath sounds normal. No respiratory distress. Abdominal: Soft.  There is no tenderness. Psychiatric: Patient has a normal mood and affect. behavior is normal. Judgment and thought content normal. Muscular Skeletal:  pain during palpation of lumbar spine, negative straight leg raise    PHQ2/9: Depression screen [facility] 2/9 02/11/2017 09/13/2016 06/11/2016 02/05/2016 11/02/2015  Decreased Interest 0 0 0 0 0  Down, Depressed, Hopeless 0 0 0 0 0  PHQ - 2 Score 0 0 0 0 0     Fall Risk: Fall Risk  02/11/2017 09/13/2016 06/11/2016 02/05/2016 11/02/2015  Falls in the past year? No No  Yes No No  Number falls in past yr: - - 1 - -  Injury with Fall? - - No - -     Assessment & Plan  1. Chronic nonmalignant pain  He came in because wife thought he was due for refill of hydrocodone, however not due for refills until next month. We will call pharmacy to verify that he does not have any rx still at [facility] called and he has one refill left. He will return in one month for follow up. 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   2. Other insomnia  Sleeping better on Trazodone, getting 6- 7 hours of sleep, no side effects, he does not want to increase dose today

## 2017-04-11 ENCOUNTER — Ambulatory Visit: Payer: BLUE CROSS/BLUE SHIELD | Admitting: Family Medicine

## 2017-05-14 ENCOUNTER — Ambulatory Visit (INDEPENDENT_AMBULATORY_CARE_PROVIDER_SITE_OTHER): Payer: BLUE CROSS/BLUE SHIELD | Admitting: Family Medicine

## 2017-05-14 ENCOUNTER — Encounter: Payer: Self-pay | Admitting: Family Medicine

## 2017-05-14 VITALS — BP 138/86 | HR 55 | Temp 98.3°F | Resp 16 | Ht 69.0 in | Wt 332.6 lb

## 2017-05-14 DIAGNOSIS — G4709 Other insomnia: Secondary | ICD-10-CM | POA: Diagnosis not present

## 2017-05-14 DIAGNOSIS — R252 Cramp and spasm: Secondary | ICD-10-CM

## 2017-05-14 DIAGNOSIS — E559 Vitamin D deficiency, unspecified: Secondary | ICD-10-CM

## 2017-05-14 DIAGNOSIS — M5440 Lumbago with sciatica, unspecified side: Secondary | ICD-10-CM

## 2017-05-14 DIAGNOSIS — I10 Essential (primary) hypertension: Secondary | ICD-10-CM

## 2017-05-14 DIAGNOSIS — G8929 Other chronic pain: Secondary | ICD-10-CM

## 2017-05-14 DIAGNOSIS — Z9884 Bariatric surgery status: Secondary | ICD-10-CM

## 2017-05-14 DIAGNOSIS — E119 Type 2 diabetes mellitus without complications: Secondary | ICD-10-CM

## 2017-05-14 DIAGNOSIS — E538 Deficiency of other specified B group vitamins: Secondary | ICD-10-CM | POA: Diagnosis not present

## 2017-05-14 DIAGNOSIS — E039 Hypothyroidism, unspecified: Secondary | ICD-10-CM

## 2017-05-14 DIAGNOSIS — B353 Tinea pedis: Secondary | ICD-10-CM

## 2017-05-14 DIAGNOSIS — E785 Hyperlipidemia, unspecified: Secondary | ICD-10-CM

## 2017-05-14 LAB — MICROALBUMIN / CREATININE URINE RATIO
Creatinine, Urine: 173 mg/dL (ref 20–370)
Microalb Creat Ratio: 2 mcg/mg creat (ref <30)
Microalb, Ur: 0.4 mg/dL

## 2017-05-14 LAB — COMPLETE METABOLIC PANEL WITH GFR
ALT: 29 U/L (ref 9–46)
AST: 25 U/L (ref 10–40)
Albumin: 4.6 g/dL (ref 3.6–5.1)
Alkaline Phosphatase: 88 U/L (ref 40–115)
BUN: 18 mg/dL (ref 7–25)
CO2: 26 mmol/L (ref 20–31)
Calcium: 9.3 mg/dL (ref 8.6–10.3)
Chloride: 103 mmol/L (ref 98–110)
Creat: 1.2 mg/dL (ref 0.60–1.35)
GFR, Est African American: 84 mL/min (ref >=60)
GFR, Est Non African American: 73 mL/min (ref >=60)
Glucose, Bld: 115 mg/dL — ABNORMAL HIGH (ref 65–99)
Potassium: 4.7 mmol/L (ref 3.5–5.3)
Sodium: 142 mmol/L (ref 135–146)
Total Bilirubin: 1.6 mg/dL — ABNORMAL HIGH (ref 0.2–1.2)
Total Protein: 7 g/dL (ref 6.1–8.1)

## 2017-05-14 LAB — LIPID PANEL
Cholesterol: 140 mg/dL (ref <200)
HDL: 33 mg/dL — ABNORMAL LOW (ref >40)
LDL Cholesterol: 90 mg/dL (ref <100)
Total CHOL/HDL Ratio: 4.2 Ratio (ref <5.0)
Triglycerides: 87 mg/dL (ref <150)
VLDL: 17 mg/dL (ref <30)

## 2017-05-14 LAB — CBC WITH DIFFERENTIAL/PLATELET
Basophils Absolute: 51 cells/uL (ref 0–200)
Basophils Relative: 1 %
Eosinophils Absolute: 153 cells/uL (ref 15–500)
Eosinophils Relative: 3 %
HCT: 47.6 % (ref 38.5–50.0)
Hemoglobin: 15.4 g/dL (ref 13.2–17.1)
Lymphocytes Relative: 27 %
Lymphs Abs: 1377 cells/uL (ref 850–3900)
MCH: 28.4 pg (ref 27.0–33.0)
MCHC: 32.4 g/dL (ref 32.0–36.0)
MCV: 87.7 fL (ref 80.0–100.0)
MPV: 9.2 fL (ref 7.5–12.5)
Monocytes Absolute: 510 cells/uL (ref 200–950)
Monocytes Relative: 10 %
Neutro Abs: 3009 cells/uL (ref 1500–7800)
Neutrophils Relative %: 59 %
Platelets: 183 10*3/uL (ref 140–400)
RBC: 5.43 MIL/uL (ref 4.20–5.80)
RDW: 13.9 % (ref 11.0–15.0)
WBC: 5.1 10*3/uL (ref 3.8–10.8)

## 2017-05-14 MED ORDER — HYDROCODONE-ACETAMINOPHEN 7.5-325 MG PO TABS: 1.0000 | ORAL_TABLET | Freq: Three times a day (TID) | ORAL | 0 refills | Status: DC | PRN

## 2017-05-14 MED ORDER — CYCLOBENZAPRINE HCL 10 MG PO TABS: 10.0000 mg | ORAL_TABLET | Freq: Every day | ORAL | 5 refills | Status: DC

## 2017-05-14 MED ORDER — KETOCONAZOLE 2 % EX CREA: 1.0000 "application " | TOPICAL_CREAM | Freq: Every day | CUTANEOUS | 0 refills | Status: DC

## 2017-05-14 MED ORDER — VITAMIN D (ERGOCALCIFEROL) 1.25 MG (50000 UNIT) PO CAPS: ORAL_CAPSULE | ORAL | 1 refills | Status: DC

## 2017-05-14 MED ORDER — PREGABALIN 225 MG PO CAPS: 225.0000 mg | ORAL_CAPSULE | Freq: Two times a day (BID) | ORAL | 2 refills | Status: DC

## 2017-05-14 NOTE — Progress Notes (Addendum)
Name: [name]   MRN: [id number]    DOB: Sep 06, 1971   Date:05/14/2017       Progress Note  Subjective  Chief Complaint  Chief Complaint  Patient presents with  . Insomnia  . Pain  . Medication Refill    HPI  Insomnia: he is on Trazodone and is sleeping better, he can stay asleep for about 7 hours.   Muscle Cramps: he states symptoms have improved with B12, D and calcium supplementation, takes Flexeril prn   Hypothyroidism: taking same dose of Synthroid for many months, last TSH at goal. He has chronic dry skin, no hair loss, no constipation. We will recheck TSH today   Chronic low back pain : he is taking Norco 7.5/325mg  about three times per day, he was on 100 pills through the winter, we decreased to 90 pills, but he is not taking three times daily every day, we will go to 80 pills. .  He denies misuse or selling.  Taking Lyrica but insurance is only paying for twice daily. .No longer under disability , working full time and has new insurance. Pain contract singed 02/11/2017 and drug screen done   OA: he still has bilateral knee pain, but left knee is worse , uses Voltaren topically and needs a refilland also takes hydrodocodone. He states left knee pain is described as aching at times sharp, daily symptoms it also cracks and pops, it is stable and he does not want to see Ortho at this time  Neuropathy: he has bilateral daily pain on both legs, aching like, he denies burning or prickly sensation, Lyrica helps.Current pain of 2/10 - better than last time  History of bariatric surgery: surgery 09/2012 was he is morbidly obese - lowest weight after surgery was 275 lbs, but is gradually gaining it back. Weight stable since last visit dow a few lbs. He is now packing his lunch instead of eating out. He is drinking more water now.  Advised him to stop drinking sweet drinks.   HTN: off medication now, bp is very high. He denies chest pain or palpitatoin   Anemia:resolved  with iron supplementation   DMII: diet controlled since bariatric surgery, we will recheck labs, reminded him for yearly eye exam.  Patient Active Problem List   Diagnosis Date Noted  . Chronic midline low back pain with sciatica 02/11/2017  . Anemia, unspecified 06/12/2016  . B12 deficiency 06/11/2016  . Allergic conjunctivitis 06/11/2016  . Hypocalcemia 06/11/2016  . Muscle cramps 06/11/2016  . Hypertension, benign 11/03/2015  . Dyslipidemia 11/02/2015  . Acquired nystagmus 08/01/2015  . Chronic back pain 06/29/2015  . Carpal tunnel syndrome 06/29/2015  . Osteoarthritis 06/29/2015  . Claustrophobia 06/29/2015  . Foot drop, left 06/29/2015  . Hiatal hernia 06/29/2015  . H/O diabetes mellitus 06/29/2015  . H/O testicular cancer 06/29/2015  . Acquired hypothyroidism 06/29/2015  . Dysmetabolic syndrome 66/44/0347  . Morbid obesity ([place]) 06/29/2015  . Allergic rhinitis 06/29/2015  . Neuropathy 06/29/2015  . Seborrheic keratoses 06/29/2015  . Vitamin D deficiency 06/29/2015  . Bariatric surgery status 02/20/2012    Past Surgical History:  Procedure Laterality Date  . BACK SURGERY     X 2  . HERNIA REPAIR     left side and umbilical hernia repair  . 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 URETEROTOMY Left 2000    Family History  Problem Relation Age of Onset  . Coronary artery disease Mother   . Cancer Father  Melanoma and Testicular  . Diabetes Father     Social History   Social History  . Marital status: Married    Spouse name: N/A  . Number of children: N/A  . Years of education: N/A   Occupational History  . Not on file.   Social History Main Topics  . Smoking status: Former Smoker    Packs/day: 3.00    Years: 8.00    Types: Cigarettes, Cigars, Pipe    Start date: 06/28/1984    Quit date: 12/24/1991  . Smokeless tobacco: Former Systems developer    Types: Chew    Quit date: 12/24/1991  . Alcohol use No  . Drug use: No  . Sexual activity: Yes    Partners:  Female   Other Topics Concern  . Not on file   Social History Narrative  . No narrative on file     Current Outpatient Prescriptions:  .  Calcium Carbonate-Vit D-Min (CALTRATE 600+D PLUS MINERALS) 600-800 MG-UNIT CHEW, Chew by mouth., Disp: , Rfl:  .  cyclobenzaprine (FLEXERIL) 10 MG tablet, Take 1 tablet (10 mg total) by mouth at bedtime., Disp: 30 tablet, Rfl: 5 .  diclofenac sodium (VOLTAREN) 1 % GEL, Apply 4 g topically 4 (four) times daily. Reported on 06/11/2016, Disp: 100 g, Rfl: 1 .  Ferrous Sulfate (IRON) 325 (65 Fe) MG TABS, Take 1 tablet by mouth daily., Disp: , Rfl:  .  fexofenadine (ALLEGRA ALLERGY) 180 MG tablet, Take 1 tablet by mouth daily., Disp: , Rfl:  .  Homeopathic Products ([employer] LEG CRAMPS PAIN RELIEF PO), Take 2 tablets by mouth 2 (two) times daily at 10 am and 4 pm., Disp: , Rfl:  .  HYDROcodone-acetaminophen (NORCO) 7.5-325 MG tablet, Take 1 tablet by mouth 3 (three) times daily as needed for moderate pain., Disp: 80 tablet, Rfl: 0 .  HYDROcodone-acetaminophen (NORCO) 7.5-325 MG tablet, Take 1 tablet by mouth 3 (three) times daily as needed., Disp: 80 tablet, Rfl: 0 .  HYDROcodone-acetaminophen (NORCO) 7.5-325 MG tablet, Take 1 tablet by mouth 3 (three) times daily as needed for moderate pain. Fill April 20th, 2018, Disp: 80 tablet, Rfl: 0 .  Potassium 99 MG TABS, Take by mouth., Disp: , Rfl:  .  pregabalin (LYRICA) 225 MG capsule, Take 1 capsule (225 mg total) by mouth 2 (two) times daily., Disp: 60 capsule, Rfl: 2 .  SYNTHROID 137 MCG tablet, TAKE 1 TABLET EVERY DAY AND 1 AND 1/2 TABLET ON SUNDAYS, Disp: 90 tablet, Rfl: 1 .  traZODone (DESYREL) 50 MG tablet, Take one-half to two tablets (25-100 mg total) by mouth at bedtime as needed for sleep., Disp: 90 tablet, Rfl: 1 .  Vitamin D, Ergocalciferol, (DRISDOL) 50000 units CAPS capsule, TAKE ONE CAPSULE EVERY SEVEN DAYS, Disp: 12 capsule, Rfl: 1  Allergies  Allergen Reactions  . Morphine Itching      ROS  Constitutional: Negative for fever or weight change.  Respiratory: Negative for cough and shortness of breath.   Cardiovascular: Negative for chest pain or palpitations.  Gastrointestinal: Negative for abdominal pain, no bowel changes.  Musculoskeletal: Negative for gait problem or joint swelling.  Skin: Negative for rash.  Neurological: Negative for dizziness or headache.  No other specific complaints in a complete review of systems (except as listed in HPI above).  Objective  Vitals:   05/14/17 0946  BP: (!) 144/88  Pulse: (!) 55  Resp: 16  Temp: 98.3 F (36.8 C)  TempSrc: Oral  SpO2: 99%  Weight: (!) 332  lb 9.6 oz (150.9 kg)  Height: 5\' 9"  (1.753 m)    Body mass index is 49.12 kg/m.  Physical Exam  Constitutional: Patient appears well-developed and well-nourished. Obese  No distress.  HEENT: head atraumatic, normocephalic, pupils equal and reactive to light, strabismus , neck supple, throat within normal limits Cardiovascular: Normal rate, regular rhythm and normal heart sounds.  No murmur heard. No BLE edema. Pulmonary/Chest: Effort normal and breath sounds normal. No respiratory distress. Abdominal: Soft.  There is no tenderness. Psychiatric: Patient has a normal mood and affect. behavior is normal. Judgment and thought content normal. Muscular Skeletal: positive straight leg raise, normal rom, no tenderness during palpation of spine today   Diabetic Foot Exam: Diabetic Foot Exam - Simple   Simple Foot Form Diabetic Foot exam was performed with the following findings:  Yes 05/14/2017 10:24 AM  Visual Inspection See comments:  Yes Sensation Testing See comments:  Yes Pulse Check Posterior Tibialis and Dorsalis pulse intact bilaterally:  Yes Comments Decrease sensation right foot, tinea pedis between toe webs right foot and also dry skin both heels and thick toenail right first toe.      PHQ2/9: Depression screen [facility] 2/9 05/14/2017 02/11/2017  09/13/2016 06/11/2016 02/05/2016  Decreased Interest 0 0 0 0 0  Down, Depressed, Hopeless 0 0 0 0 0  PHQ - 2 Score 0 0 0 0 0     Fall Risk: Fall Risk  05/14/2017 02/11/2017 09/13/2016 06/11/2016 02/05/2016  Falls in the past year? No No No Yes No  Number falls in past yr: - - - 1 -  Injury with Fall? - - - No -     Functional Status Survey: Is the patient deaf or have difficulty hearing?: No Does the patient have difficulty concentrating, remembering, or making decisions?: No Does the patient have difficulty walking or climbing stairs?: No Does the patient have difficulty dressing or bathing?: No Does the patient have difficulty doing errands alone such as visiting a doctor's office or shopping?: No    Assessment & Plan  1. Chronic nonmalignant pain  Pain contract signed again - pregabalin (LYRICA) 225 MG capsule; Take 1 capsule (225 mg total) by mouth 2 (two) times daily.  Dispense: 60 capsule; Refill: 2 - HYDROcodone-acetaminophen (NORCO) 7.5-325 MG tablet; Take 1 tablet by mouth 3 (three) times daily as needed for moderate pain.  Dispense: 80 tablet; Refill: 0 - HYDROcodone-acetaminophen (NORCO) 7.5-325 MG tablet; Take 1 tablet by mouth 3 (three) times daily as needed.  Dispense: 80 tablet; Refill: 0 - HYDROcodone-acetaminophen (NORCO) 7.5-325 MG tablet; Take 1 tablet by mouth 3 (three) times daily as needed for moderate pain. Fill April 20th, 2018  Dispense: 80 tablet; Refill: 0  2. Other insomnia  Taking Trazodone   3. Acquired hypothyroidism  - TSH  4. Bariatric surgery status  Lost a few pounds since last visit  5. B12 deficiency  - Vitamin B12  6. Dyslipidemia  - Lipid panel  7. Hypertension, benign  - COMPLETE METABOLIC PANEL WITH GFR - CBC with Differential/Platelet  8. Diet-controlled diabetes mellitus ([place])  - Hemoglobin A1c - Urine Microalbumin w/creat. ratio  9. Morbid obesity, unspecified obesity type ([place])  - Insulin, fasting  10. Vitamin D  deficiency  - Vitamin D, Ergocalciferol, (DRISDOL) 50000 units CAPS capsule; TAKE ONE CAPSULE EVERY SEVEN DAYS  Dispense: 12 capsule; Refill: 1 - VITAMIN D 25 Hydroxy (Vit-D Deficiency, Fractures)  11. Chronic midline low back pain with sciatica, sciatica laterality unspecified  - pregabalin (  LYRICA) 225 MG capsule; Take 1 capsule (225 mg total) by mouth 2 (two) times daily.  Dispense: 60 capsule; Refill: 2 - HYDROcodone-acetaminophen (NORCO) 7.5-325 MG tablet; Take 1 tablet by mouth 3 (three) times daily as needed for moderate pain.  Dispense: 80 tablet; Refill: 0 - HYDROcodone-acetaminophen (NORCO) 7.5-325 MG tablet; Take 1 tablet by mouth 3 (three) times daily as needed.  Dispense: 80 tablet; Refill: 0 - HYDROcodone-acetaminophen (NORCO) 7.5-325 MG tablet; Take 1 tablet by mouth 3 (three) times daily as needed for moderate pain. Fill April 20th, 2018  Dispense: 80 tablet; Refill: 0  12. Muscle cramps  - cyclobenzaprine (FLEXERIL) 10 MG tablet; Take 1 tablet (10 mg total) by mouth at bedtime.  Dispense: 30 tablet; Refill: 5  13. Tinea pedis of both feet  - ketoconazole (NIZORAL) 2 % cream; Apply 1 application topically daily.  Dispense: 60 g; Refill: 0

## 2017-05-15 LAB — HEMOGLOBIN A1C
Hgb A1c MFr Bld: 6.3 % — ABNORMAL HIGH (ref <5.7)
Mean Plasma Glucose: 134 mg/dL

## 2017-05-15 LAB — TSH: TSH: 2.94 mIU/L (ref 0.40–4.50)

## 2017-05-15 LAB — INSULIN, FASTING: Insulin fasting, serum: 9 u[IU]/mL (ref 2.0–19.6)

## 2017-05-15 LAB — VITAMIN D 25 HYDROXY (VIT D DEFICIENCY, FRACTURES): Vit D, 25-Hydroxy: 55 ng/mL (ref 30–100)

## 2017-05-15 LAB — VITAMIN B12: Vitamin B-12: 484 pg/mL (ref 200–1100)

## 2017-07-11 ENCOUNTER — Other Ambulatory Visit: Payer: Self-pay | Admitting: Family Medicine

## 2017-07-11 DIAGNOSIS — G4709 Other insomnia: Secondary | ICD-10-CM

## 2017-07-11 DIAGNOSIS — B353 Tinea pedis: Secondary | ICD-10-CM

## 2017-07-27 DIAGNOSIS — I10 Essential (primary) hypertension: Secondary | ICD-10-CM | POA: Diagnosis not present

## 2017-07-27 DIAGNOSIS — R2242 Localized swelling, mass and lump, left lower limb: Secondary | ICD-10-CM | POA: Diagnosis not present

## 2017-07-27 DIAGNOSIS — R202 Paresthesia of skin: Secondary | ICD-10-CM | POA: Diagnosis not present

## 2017-07-27 DIAGNOSIS — M79672 Pain in left foot: Secondary | ICD-10-CM | POA: Diagnosis not present

## 2017-07-27 DIAGNOSIS — Z885 Allergy status to narcotic agent status: Secondary | ICD-10-CM | POA: Diagnosis not present

## 2017-07-27 DIAGNOSIS — E119 Type 2 diabetes mellitus without complications: Secondary | ICD-10-CM | POA: Diagnosis not present

## 2017-07-27 DIAGNOSIS — M79675 Pain in left toe(s): Secondary | ICD-10-CM | POA: Diagnosis not present

## 2017-07-27 DIAGNOSIS — R200 Anesthesia of skin: Secondary | ICD-10-CM | POA: Diagnosis not present

## 2017-08-01 ENCOUNTER — Ambulatory Visit (INDEPENDENT_AMBULATORY_CARE_PROVIDER_SITE_OTHER): Payer: BLUE CROSS/BLUE SHIELD | Admitting: Family Medicine

## 2017-08-01 ENCOUNTER — Encounter: Payer: Self-pay | Admitting: Family Medicine

## 2017-08-01 VITALS — BP 138/84 | HR 56 | Temp 98.1°F | Resp 18 | Ht 69.0 in | Wt 342.6 lb

## 2017-08-01 DIAGNOSIS — R454 Irritability and anger: Secondary | ICD-10-CM | POA: Diagnosis not present

## 2017-08-01 DIAGNOSIS — R030 Elevated blood-pressure reading, without diagnosis of hypertension: Secondary | ICD-10-CM

## 2017-08-01 DIAGNOSIS — I10 Essential (primary) hypertension: Secondary | ICD-10-CM

## 2017-08-01 DIAGNOSIS — R079 Chest pain, unspecified: Secondary | ICD-10-CM

## 2017-08-01 LAB — COMPLETE METABOLIC PANEL WITH GFR
ALT: 27 U/L (ref 9–46)
AST: 23 U/L (ref 10–40)
Albumin: 4.2 g/dL (ref 3.6–5.1)
Alkaline Phosphatase: 82 U/L (ref 40–115)
BUN: 17 mg/dL (ref 7–25)
CO2: 29 mmol/L (ref 20–32)
Calcium: 9.2 mg/dL (ref 8.6–10.3)
Chloride: 104 mmol/L (ref 98–110)
Creat: 1.14 mg/dL (ref 0.60–1.35)
GFR, Est African American: 89 mL/min (ref >=60)
GFR, Est Non African American: 77 mL/min (ref >=60)
Glucose, Bld: 119 mg/dL — ABNORMAL HIGH (ref 65–99)
Potassium: 4.7 mmol/L (ref 3.5–5.3)
Sodium: 141 mmol/L (ref 135–146)
Total Bilirubin: 0.8 mg/dL (ref 0.2–1.2)
Total Protein: 6.6 g/dL (ref 6.1–8.1)

## 2017-08-01 LAB — CBC WITH DIFFERENTIAL/PLATELET
Basophils Absolute: 0 cells/uL (ref 0–200)
Basophils Relative: 0 %
Eosinophils Absolute: 208 cells/uL (ref 15–500)
Eosinophils Relative: 4 %
HCT: 47.6 % (ref 38.5–50.0)
Hemoglobin: 15.5 g/dL (ref 13.2–17.1)
Lymphocytes Relative: 28 %
Lymphs Abs: 1456 cells/uL (ref 850–3900)
MCH: 28.8 pg (ref 27.0–33.0)
MCHC: 32.6 g/dL (ref 32.0–36.0)
MCV: 88.5 fL (ref 80.0–100.0)
MPV: 9.5 fL (ref 7.5–12.5)
Monocytes Absolute: 624 cells/uL (ref 200–950)
Monocytes Relative: 12 %
Neutro Abs: 2912 cells/uL (ref 1500–7800)
Neutrophils Relative %: 56 %
Platelets: 164 10*3/uL (ref 140–400)
RBC: 5.38 MIL/uL (ref 4.20–5.80)
RDW: 14.2 % (ref 11.0–15.0)
WBC: 5.2 10*3/uL (ref 3.8–10.8)

## 2017-08-01 LAB — TSH: TSH: 3.44 mIU/L (ref 0.40–4.50)

## 2017-08-01 MED ORDER — LOSARTAN POTASSIUM-HCTZ 50-12.5 MG PO TABS: 1.0000 | ORAL_TABLET | Freq: Every day | ORAL | 0 refills | Status: DC

## 2017-08-01 NOTE — Patient Instructions (Addendum)
PLEASE CHECK YOUR BLOOD PRESSURE TWICE A DAY FOR THE NEXT 2 WEEKS, KEEP A LOG, AND BRING IT WITH YOU TO YOUR 2 WEEK FOLLOW UP APPOINTMENT.  DASH Eating Plan DASH stands for "Dietary Approaches to Stop Hypertension." The DASH eating plan is a healthy eating plan that has been shown to reduce high blood pressure (hypertension). It may also reduce your risk for type 2 diabetes, heart disease, and stroke. The DASH eating plan may also help with weight loss. What are tips for following this plan? General guidelines  Avoid eating more than 2,300 mg (milligrams) of salt (sodium) a day. If you have hypertension, you may need to reduce your sodium intake to 1,500 mg a day.  Limit alcohol intake to no more than 1 drink a day for nonpregnant women and 2 drinks a day for men. One drink equals 12 oz of beer, 5 oz of wine, or 1 oz of hard liquor.  Work with your health care provider to maintain a healthy body weight or to lose weight. Ask what an ideal weight is for you.  Get at least 30 minutes of exercise that causes your heart to beat faster (aerobic exercise) most days of the week. Activities may include walking, swimming, or biking.  Work with your health care provider or diet and nutrition specialist (dietitian) to adjust your eating plan to your individual calorie needs. Reading food labels  Check food labels for the amount of sodium per serving. Choose foods with less than 5 percent of the Daily Value of sodium. Generally, foods with less than 300 mg of sodium per serving fit into this eating plan.  To find whole grains, look for the word "whole" as the first word in the ingredient list. Shopping  Buy products labeled as "low-sodium" or "no salt added."  Buy fresh foods. Avoid canned foods and premade or frozen meals. Cooking  Avoid adding salt when cooking. Use salt-free seasonings or herbs instead of table salt or sea salt. Check with your health care provider or pharmacist before using  salt substitutes.  Do not fry foods. Cook foods using healthy methods such as baking, boiling, grilling, and broiling instead.  Cook with heart-healthy oils, such as olive, canola, soybean, or sunflower oil. Meal planning   Eat a balanced diet that includes: ? 5 or more servings of fruits and vegetables each day. At each meal, try to fill half of your plate with fruits and vegetables. ? Up to 6-8 servings of whole grains each day. ? Less than 6 oz of lean meat, poultry, or fish each day. A 3-oz serving of meat is about the same size as a deck of cards. One egg equals 1 oz. ? 2 servings of low-fat dairy each day. ? A serving of nuts, seeds, or beans 5 times each week. ? Heart-healthy fats. Healthy fats called Omega-3 fatty acids are found in foods such as flaxseeds and coldwater fish, like sardines, salmon, and mackerel.  Limit how much you eat of the following: ? Canned or prepackaged foods. ? Food that is high in trans fat, such as fried foods. ? Food that is high in saturated fat, such as fatty meat. ? Sweets, desserts, sugary drinks, and other foods with added sugar. ? Full-fat dairy products.  Do not salt foods before eating.  Try to eat at least 2 vegetarian meals each week.  Eat more home-cooked food and less restaurant, buffet, and fast food.  When eating at a restaurant, ask that your food  be prepared with less salt or no salt, if possible. What foods are recommended? The items listed may not be a complete list. Talk with your dietitian about what dietary choices are best for you. Grains Whole-grain or whole-wheat bread. Whole-grain or whole-wheat pasta. Brown rice. [place]. Bulgur. Whole-grain and low-sodium cereals. Pita bread. Low-fat, low-sodium crackers. Whole-wheat flour tortillas. Vegetables Fresh or frozen vegetables (raw, steamed, roasted, or grilled). Low-sodium or reduced-sodium tomato and vegetable juice. Low-sodium or reduced-sodium tomato sauce and  tomato paste. Low-sodium or reduced-sodium canned vegetables. Fruits All fresh, dried, or frozen fruit. Canned fruit in natural juice (without added sugar). Meat and other protein foods Skinless chicken or [place]. Ground chicken or [place]. Pork with fat trimmed off. Fish and seafood. Egg whites. Dried beans, peas, or lentils. Unsalted nuts, nut butters, and seeds. Unsalted canned beans. Lean cuts of beef with fat trimmed off. Low-sodium, lean deli meat. Dairy Low-fat (1%) or fat-free (skim) milk. Fat-free, low-fat, or reduced-fat cheeses. Nonfat, low-sodium ricotta or cottage cheese. Low-fat or nonfat yogurt. Low-fat, low-sodium cheese. Fats and oils Soft margarine without trans fats. Vegetable oil. Low-fat, reduced-fat, or light mayonnaise and salad dressings (reduced-sodium). Canola, safflower, olive, soybean, and sunflower oils. Avocado. Seasoning and other foods Herbs. Spices. Seasoning mixes without salt. Unsalted popcorn and pretzels. Fat-free sweets. What foods are not recommended? The items listed may not be a complete list. Talk with your dietitian about what dietary choices are best for you. Grains Baked goods made with fat, such as croissants, muffins, or some breads. Dry pasta or rice meal packs. Vegetables Creamed or fried vegetables. Vegetables in a cheese sauce. Regular canned vegetables (not low-sodium or reduced-sodium). Regular canned tomato sauce and paste (not low-sodium or reduced-sodium). Regular tomato and vegetable juice (not low-sodium or reduced-sodium). Angie Fava. Olives. Fruits Canned fruit in a light or heavy syrup. Fried fruit. Fruit in cream or butter sauce. Meat and other protein foods Fatty cuts of meat. Ribs. Fried meat. Berniece Salines. Sausage. Bologna and other processed lunch meats. Salami. Fatback. Hotdogs. Bratwurst. Salted nuts and seeds. Canned beans with added salt. Canned or smoked fish. Whole eggs or egg yolks. Chicken or [place] with skin. Dairy Whole or 2%  milk, cream, and half-and-half. Whole or full-fat cream cheese. Whole-fat or sweetened yogurt. Full-fat cheese. Nondairy creamers. Whipped toppings. Processed cheese and cheese spreads. Fats and oils Butter. Stick margarine. Lard. Shortening. Ghee. Bacon fat. Tropical oils, such as coconut, palm kernel, or palm oil. Seasoning and other foods Salted popcorn and pretzels. Onion salt, garlic salt, seasoned salt, table salt, and sea salt. Worcestershire sauce. Tartar sauce. Barbecue sauce. Teriyaki sauce. Soy sauce, including reduced-sodium. Steak sauce. Canned and packaged gravies. Fish sauce. Oyster sauce. Cocktail sauce. Horseradish that you find on the shelf. Ketchup. Mustard. Meat flavorings and tenderizers. Bouillon cubes. Hot sauce and Tabasco sauce. Premade or packaged marinades. Premade or packaged taco seasonings. Relishes. Regular salad dressings. Where to find more information:  National Heart, Lung, and [place]: https://wilson-eaton.com/  American Heart Association: www.heart.org Summary  The DASH eating plan is a healthy eating plan that has been shown to reduce high blood pressure (hypertension). It may also reduce your risk for type 2 diabetes, heart disease, and stroke.  With the DASH eating plan, you should limit salt (sodium) intake to 2,300 mg a day. If you have hypertension, you may need to reduce your sodium intake to 1,500 mg a day.  When on the DASH eating plan, aim to eat more fresh fruits and vegetables, whole  grains, lean proteins, low-fat dairy, and heart-healthy fats.  Work with your health care provider or diet and nutrition specialist (dietitian) to adjust your eating plan to your individual calorie needs. This information is not intended to replace advice given to you by your health care provider. Make sure you discuss any questions you have with your health care provider. Document Released: 11/28/2011 Document Revised: 12/02/2016 Document Reviewed:  12/02/2016 Elsevier Interactive Patient Education  2017 [employer].

## 2017-08-01 NOTE — Progress Notes (Addendum)
Name: [name]   MRN: [id number]    DOB: Jan 12, 1971   Date:08/01/2017       Progress Note  Subjective  Chief Complaint  Chief Complaint  Patient presents with  . Hypertension    Bp elevated on yesterday 176/120    HPI  Pt presents with elevated BP's came off of BP medications back in 2013 after having gastric bypass surgery.  Pt has been gradually gaining weight, and has gained approx 10lbs since last visit in May 2018.  [facility] - was seen in the ER for LEFT foot pain and had elevated BP reading of 193/110 oin 07/27/2017.  Yesterday at a drug store he checked it again and it was 176/120.  Has been having intermittent headaches over the last 3 months - was unsure if this was sinuses vs HTN related. No blurred vision, no swelling, or shortness of breath.  Reports having ocasional chest pain episodes - most recent episode was 07/23/2017 when he was extremely angry and was yelling at his boss. Pain will wake him at night and feel like heart burn - this happens maybe once a week many years - he does note that he dreams about work and this also causes him anxiety at night.  Patient Active Problem List   Diagnosis Date Noted  . Chronic midline low back pain with sciatica 02/11/2017  . Anemia, unspecified 06/12/2016  . B12 deficiency 06/11/2016  . Allergic conjunctivitis 06/11/2016  . Hypocalcemia 06/11/2016  . Muscle cramps 06/11/2016  . Hypertension, benign 11/03/2015  . Dyslipidemia 11/02/2015  . Acquired nystagmus 08/01/2015  . Chronic back pain 06/29/2015  . Carpal tunnel syndrome 06/29/2015  . Osteoarthritis 06/29/2015  . Claustrophobia 06/29/2015  . Foot drop, left 06/29/2015  . Hiatal hernia 06/29/2015  . H/O diabetes mellitus 06/29/2015  . H/O testicular cancer 06/29/2015  . Acquired hypothyroidism 06/29/2015  . Dysmetabolic syndrome 15/72/6203  . Morbid obesity ([place]) 06/29/2015  . Allergic rhinitis 06/29/2015  . Neuropathy 06/29/2015  . Seborrheic keratoses  06/29/2015  . Vitamin D deficiency 06/29/2015  . Bariatric surgery status 02/20/2012    Social History  Substance Use Topics  . Smoking status: Former Smoker    Packs/day: 3.00    Years: 8.00    Types: Cigarettes, Cigars, Pipe    Start date: 06/28/1984    Quit date: 12/24/1991  . Smokeless tobacco: Former Systems developer    Types: Chew    Quit date: 12/24/1991  . Alcohol use No     Current Outpatient Prescriptions:  .  Calcium Carbonate-Vit D-Min (CALTRATE 600+D PLUS MINERALS) 600-800 MG-UNIT CHEW, Chew by mouth., Disp: , Rfl:  .  cyclobenzaprine (FLEXERIL) 10 MG tablet, Take 1 tablet (10 mg total) by mouth at bedtime., Disp: 30 tablet, Rfl: 5 .  diclofenac sodium (VOLTAREN) 1 % GEL, Apply 4 g topically 4 (four) times daily. Reported on 06/11/2016, Disp: 100 g, Rfl: 1 .  Ferrous Sulfate (IRON) 325 (65 Fe) MG TABS, Take 1 tablet by mouth daily., Disp: , Rfl:  .  fexofenadine (ALLEGRA ALLERGY) 180 MG tablet, Take 1 tablet by mouth daily., Disp: , Rfl:  .  Homeopathic Products ([facility] LEG CRAMPS PAIN RELIEF PO), Take 2 tablets by mouth 2 (two) times daily at 10 am and 4 pm., Disp: , Rfl:  .  HYDROcodone-acetaminophen (NORCO) 7.5-325 MG tablet, Take 1 tablet by mouth 3 (three) times daily as needed for moderate pain., Disp: 80 tablet, Rfl: 0 .  HYDROcodone-acetaminophen (NORCO) 7.5-325 MG tablet, Take 1  tablet by mouth 3 (three) times daily as needed., Disp: 80 tablet, Rfl: 0 .  HYDROcodone-acetaminophen (NORCO) 7.5-325 MG tablet, Take 1 tablet by mouth 3 (three) times daily as needed for moderate pain. Fill April 20th, 2018, Disp: 80 tablet, Rfl: 0 .  ketoconazole (NIZORAL) 2 % cream, Apply 1 application topically daily., Disp: 60 g, Rfl: 1 .  Potassium 99 MG TABS, Take by mouth., Disp: , Rfl:  .  pregabalin (LYRICA) 225 MG capsule, Take 1 capsule (225 mg total) by mouth 2 (two) times daily., Disp: 60 capsule, Rfl: 2 .  SYNTHROID 137 MCG tablet, TAKE 1 TABLET EVERY DAY AND 1 AND 1/2 TABLET ON SUNDAYS, Disp:  90 tablet, Rfl: 1 .  traZODone (DESYREL) 50 MG tablet, Take one-half to two tablets (25-100 mg total) by mouth at bedtime as needed for sleep., Disp: 90 tablet, Rfl: 1 .  Vitamin D, Ergocalciferol, (DRISDOL) 50000 units CAPS capsule, TAKE ONE CAPSULE EVERY SEVEN DAYS, Disp: 12 capsule, Rfl: 1  Allergies  Allergen Reactions  . Morphine Itching    ROS  Constitutional: Negative for fever or weight change.  Respiratory: Negative for cough and shortness of breath.   Cardiovascular: Positive for chest pain; positive for palpitations.  Gastrointestinal: Negative for abdominal pain, no bowel changes.  Musculoskeletal: Negative for gait problem or joint swelling.  Skin: Negative for rash.  Neurological: Negative for dizziness or headache.  No other specific complaints in a complete review of systems (except as listed in HPI above).  Objective  Vitals:   08/01/17 1011  BP: 138/84  Pulse: (!) 56  Resp: 18  Temp: 98.1 F (36.7 C)  TempSrc: Oral  SpO2: 97%  Weight: (!) 342 lb 9.6 oz (155.4 kg)  Height: 5' 9"  (1.753 m)   Body mass index is 50.59 kg/m.  Nursing Note and Vital Signs reviewed.   - HR approx 54-58bpm auscultated - appears to have been similarly low in May 2018, but otherwise is atypical for the patient.  We will check labs and ECG today.  Physical Exam  Constitutional: Patient appears well-developed and well-nourished. Obese No distress.  HEENT: head atraumatic, normocephalic Cardiovascular: Normal rate, regular rhythm, S1/S2 present.  No murmur or rub heard. No BLE edema. Pulmonary/Chest: Effort normal and breath sounds clear. No respiratory distress or retractions. Psychiatric: Patient has a normal mood and affect. behavior is normal. Judgment and thought content normal. <ECGINTERP>  EKG: sinus bradycardia.  Recent Results (from the past 2160 hour(s))  COMPLETE METABOLIC PANEL WITH GFR     Status: Abnormal   Collection Time: 05/14/17 11:01 AM  Result Value Ref  Range   Sodium 142 135 - 146 mmol/L   Potassium 4.7 3.5 - 5.3 mmol/L   Chloride 103 98 - 110 mmol/L   CO2 26 20 - 31 mmol/L   Glucose, Bld 115 (H) 65 - 99 mg/dL   BUN 18 7 - 25 mg/dL   Creat 1.20 0.60 - 1.35 mg/dL   Total Bilirubin 1.6 (H) 0.2 - 1.2 mg/dL   Alkaline Phosphatase 88 40 - 115 U/L   AST 25 10 - 40 U/L   ALT 29 9 - 46 U/L   Total Protein 7.0 6.1 - 8.1 g/dL   Albumin 4.6 3.6 - 5.1 g/dL   Calcium 9.3 8.6 - 10.3 mg/dL   GFR, Est African American 84 >=60 mL/min   GFR, Est Non African American 73 >=60 mL/min  CBC with Differential/Platelet     Status: None   Collection  Time: 05/14/17 11:01 AM  Result Value Ref Range   WBC 5.1 3.8 - 10.8 K/uL   RBC 5.43 4.20 - 5.80 MIL/uL   Hemoglobin 15.4 13.2 - 17.1 g/dL   HCT 47.6 38.5 - 50.0 %   MCV 87.7 80.0 - 100.0 fL   MCH 28.4 27.0 - 33.0 pg   MCHC 32.4 32.0 - 36.0 g/dL   RDW 13.9 11.0 - 15.0 %   Platelets 183 140 - 400 K/uL   MPV 9.2 7.5 - 12.5 fL   Neutro Abs 3,009 1,500 - 7,800 cells/uL   Lymphs Abs 1,377 850 - 3,900 cells/uL   Monocytes Absolute 510 200 - 950 cells/uL   Eosinophils Absolute 153 15 - 500 cells/uL   Basophils Absolute 51 0 - 200 cells/uL   Neutrophils Relative % 59 %   Lymphocytes Relative 27 %   Monocytes Relative 10 %   Eosinophils Relative 3 %   Basophils Relative 1 %   Smear Review Criteria for review not met   Hemoglobin A1c     Status: Abnormal   Collection Time: 05/14/17 11:01 AM  Result Value Ref Range   Hgb A1c MFr Bld 6.3 (H) <5.7 %    Comment:   For someone without known diabetes, a hemoglobin A1c value between 5.7% and 6.4% is consistent with prediabetes and should be confirmed with a follow-up test.   For someone with known diabetes, a value <7% indicates that their diabetes is well controlled. A1c targets should be individualized based on duration of diabetes, age, co-morbid conditions and other considerations.   This assay result is consistent with an increased risk of diabetes.    Currently, no consensus exists regarding use of hemoglobin A1c for diagnosis of diabetes in children.      Mean Plasma Glucose 134 mg/dL  Lipid panel     Status: Abnormal   Collection Time: 05/14/17 11:01 AM  Result Value Ref Range   Cholesterol 140 <200 mg/dL   Triglycerides 87 <150 mg/dL   HDL 33 (L) >40 mg/dL   Total CHOL/HDL Ratio 4.2 <5.0 Ratio   VLDL 17 <30 mg/dL   LDL Cholesterol 90 <100 mg/dL  Insulin, fasting     Status: None   Collection Time: 05/14/17 11:01 AM  Result Value Ref Range   Insulin fasting, serum 9.0 2.0 - 19.6 uIU/mL    Comment:   This insulin assay shows strong cross-reactivity for some insulin analogs (lispro, aspart, and glargine) and much lower cross-reactivity with others (detemir, glulisine).   Stimulated Insulin reference intervals were established using the Siemens Immulite assay. These values are provided for general guidance only.   TSH     Status: None   Collection Time: 05/14/17 11:01 AM  Result Value Ref Range   TSH 2.94 0.40 - 4.50 mIU/L  VITAMIN D 25 Hydroxy (Vit-D Deficiency, Fractures)     Status: None   Collection Time: 05/14/17 11:01 AM  Result Value Ref Range   Vit D, 25-Hydroxy 55 30 - 100 ng/mL    Comment: Vitamin D Status           25-OH Vitamin D        Deficiency                <20 ng/mL        Insufficiency         20 - 29 ng/mL        Optimal             >  or = 30 ng/mL   For 25-OH Vitamin D testing on patients on D2-supplementation and patients for whom quantitation of D2 and D3 fractions is required, the QuestAssureD 25-OH VIT D, (D2,D3), LC/MS/MS is recommended: order code [phone] (patients > 2 yrs).   Vitamin B12     Status: None   Collection Time: 05/14/17 11:01 AM  Result Value Ref Range   Vitamin B-12 484 200 - 1,100 pg/mL  Urine Microalbumin w/creat. ratio     Status: None   Collection Time: 05/14/17 11:01 AM  Result Value Ref Range   Creatinine, Urine 173 20 - 370 mg/dL   Microalb, Ur 0.4 Not estab mg/dL    Microalb Creat Ratio 2 <30 mcg/mg creat    Comment: The ADA has defined abnormalities in albumin excretion as follows:           Category           Result                            (mcg/mg creatinine)                 Normal:    <30       Microalbuminuria:    30 - 299   Clinical albuminuria:    > or = 300   The ADA recommends that at least two of three specimens collected within a 3 - 6 month period be abnormal before considering a patient to be within a diagnostic category.        Assessment & Plan  1. Hypertension, benign - EKG 12-Lead - COMPLETE METABOLIC PANEL WITH GFR -- LAST PO INTAKE WAS 0500. - TSH - Parathyroid hormone, intact (no Ca) - losartan-hydrochlorothiazide (HYZAAR) 50-12.5 MG tablet; Take 1 tablet by mouth daily.  Dispense: 30 tablet; Refill: 0 - Check BP twice daily, keep log, bring to 2 week follow up. - Dash diet reviewed  2. Chest pain, unspecified type - EKG 12-Lead - COMPLETE METABOLIC PANEL WITH GFR - TSH - Parathyroid hormone, intact (no Ca) - CBC w/Diff/Platelet  3. Elevated blood pressure reading - COMPLETE METABOLIC PANEL WITH GFR - TSH - Parathyroid hormone, intact (no Ca) - losartan-hydrochlorothiazide (HYZAAR) 50-12.5 MG tablet; Take 1 tablet by mouth daily.  Dispense: 30 tablet; Refill: 0  4. Difficulty controlling anger - Parathyroid hormone, intact (no Ca)  5. Morbid obesity ([facility]) - COMPLETE METABOLIC PANEL WITH GFR - TSH - CBC w/Diff/Platelet - Discussed patient's ongoing weight gain, strategies with DASH diet and increasing exercise, patient is willing to consider his diet, but insists he gets enough exercise through work. Encouraged patient to take time to assess his lifestyle and any positive changes that he feels are plausible.  -Red flags and when to present for emergency care or RTC including fever >101.4F, chest pain, shortness of breath, BLE edema, lightheadedness/dizziness, near syncope or syncope,  new/worsening/un-resolving symptoms, reviewed with patient at time of visit. Follow up and care instructions discussed and provided in AVS.  I have reviewed this encounter including the documentation in this note and/or discussed this patient with the Johney Maine, FNP, NP-C. I am certifying that I agree with the content of this note as supervising physician.  [name], MD [facility] 08/04/2017, 9:42 PM

## 2017-08-04 ENCOUNTER — Ambulatory Visit: Payer: BLUE CROSS/BLUE SHIELD | Admitting: Family Medicine

## 2017-08-04 LAB — PARATHYROID HORMONE, INTACT (NO CA): PTH: 39 pg/mL (ref 14–64)

## 2017-08-05 DIAGNOSIS — S93622A Sprain of tarsometatarsal ligament of left foot, initial encounter: Secondary | ICD-10-CM | POA: Diagnosis not present

## 2017-08-12 ENCOUNTER — Other Ambulatory Visit: Payer: Self-pay | Admitting: Family Medicine

## 2017-08-12 DIAGNOSIS — M5440 Lumbago with sciatica, unspecified side: Principal | ICD-10-CM

## 2017-08-12 DIAGNOSIS — G8929 Other chronic pain: Secondary | ICD-10-CM

## 2017-08-12 DIAGNOSIS — E039 Hypothyroidism, unspecified: Secondary | ICD-10-CM

## 2017-08-15 ENCOUNTER — Encounter: Payer: Self-pay | Admitting: Family Medicine

## 2017-08-15 ENCOUNTER — Ambulatory Visit (INDEPENDENT_AMBULATORY_CARE_PROVIDER_SITE_OTHER): Payer: BLUE CROSS/BLUE SHIELD | Admitting: Family Medicine

## 2017-08-15 VITALS — BP 126/88 | HR 65 | Temp 97.9°F | Resp 16 | Ht 69.0 in | Wt 335.5 lb

## 2017-08-15 DIAGNOSIS — Z23 Encounter for immunization: Secondary | ICD-10-CM

## 2017-08-15 DIAGNOSIS — R252 Cramp and spasm: Secondary | ICD-10-CM | POA: Diagnosis not present

## 2017-08-15 DIAGNOSIS — G4709 Other insomnia: Secondary | ICD-10-CM | POA: Diagnosis not present

## 2017-08-15 DIAGNOSIS — E538 Deficiency of other specified B group vitamins: Secondary | ICD-10-CM | POA: Diagnosis not present

## 2017-08-15 DIAGNOSIS — M5440 Lumbago with sciatica, unspecified side: Secondary | ICD-10-CM

## 2017-08-15 DIAGNOSIS — G8929 Other chronic pain: Secondary | ICD-10-CM | POA: Diagnosis not present

## 2017-08-15 DIAGNOSIS — Z9884 Bariatric surgery status: Secondary | ICD-10-CM

## 2017-08-15 DIAGNOSIS — I10 Essential (primary) hypertension: Secondary | ICD-10-CM

## 2017-08-15 DIAGNOSIS — B353 Tinea pedis: Secondary | ICD-10-CM | POA: Diagnosis not present

## 2017-08-15 DIAGNOSIS — M1712 Unilateral primary osteoarthritis, left knee: Secondary | ICD-10-CM

## 2017-08-15 DIAGNOSIS — E785 Hyperlipidemia, unspecified: Secondary | ICD-10-CM

## 2017-08-15 DIAGNOSIS — E119 Type 2 diabetes mellitus without complications: Secondary | ICD-10-CM

## 2017-08-15 DIAGNOSIS — E039 Hypothyroidism, unspecified: Secondary | ICD-10-CM

## 2017-08-15 MED ORDER — TRAZODONE HCL 100 MG PO TABS: 100.0000 mg | ORAL_TABLET | Freq: Every day | ORAL | 1 refills | Status: DC

## 2017-08-15 MED ORDER — KETOCONAZOLE 2 % EX CREA: 1.0000 "application " | TOPICAL_CREAM | Freq: Every day | CUTANEOUS | 1 refills | Status: DC

## 2017-08-15 MED ORDER — HYDROCODONE-ACETAMINOPHEN 7.5-325 MG PO TABS: 1.0000 | ORAL_TABLET | Freq: Three times a day (TID) | ORAL | 0 refills | Status: DC | PRN

## 2017-08-15 MED ORDER — LOSARTAN POTASSIUM-HCTZ 50-12.5 MG PO TABS: 1.0000 | ORAL_TABLET | Freq: Every day | ORAL | 1 refills | Status: DC

## 2017-08-15 MED ORDER — CYCLOBENZAPRINE HCL 10 MG PO TABS: 10.0000 mg | ORAL_TABLET | Freq: Every day | ORAL | 1 refills | Status: DC

## 2017-08-15 NOTE — Progress Notes (Signed)
Name: [name]   MRN: [id number]    DOB: 1971/01/20   Date:08/15/2017       Progress Note  Subjective  Chief Complaint  Chief Complaint  Patient presents with  . Medication Refill    3 month F/U, Needs Refill of all medications  . Pain    Unchanged  . Insomnia    Sleeping on average 6 hours nightly  . Hypothyroidism    Cold Intolerance  . Diabetes    Diet controlled and has lost 8 pounds since last visit.  Marland Kitchen Hypertension    Wife states his BP has been improving    HPI  Insomnia: he is on Trazodone 100 mg every night,  and is sleeping better, on average 6.5 hours but mostly light sleep and is interested in going up on the dose  Muscle Cramps: he states symptoms have improved with B12, D and calcium supplementation, takes Flexeril qhs  Hypothyroidism: taking same dose of Synthroid for many months, last TSH at goal. He has chronic dry skin, no hair loss, no constipation.   Chronic low back pain : he is taking Norco 7.5/325mg about three times per day, he was on 100 pills through the winter, we decreased to 90 pills, but he is not taking three times daily every day, we will go to and gradually weaned down to 80 pills during the Summer. He denies misuse or selling.  Taking Lyrica and he states when he ran out of medication he was unable to sleep because pain was more intense .No longer under disability , working full time and has new insurance. Pain contract singed 02/11/2017 and drug screen done   OA: he still has bilateral knee pain, but left knee is worse , uses Voltaren topically and needs a refilland also takes hydrodocodone. He states left knee pain is described as aching at times sharp, daily symptoms it also cracks and pops, he would like to see Ortho now, also recently had a sprain of right foot and is wearing a boot  Neuropathy: he has bilateral daily pain on both legs, aching like, he denies burning or prickly sensation, Lyrica helps.Current pain of 5/10    History of bariatric surgery: surgery 09/2012 was he is morbidly obese his weight at the time was 396 lbs - lowest weight after surgery was 275 lbs, but is gradually gaining it back, but lost 13 lbs since last visit a couple of weeks ago. He is now packing his lunch instead of eating out. He is drinking more water now.   HTN: back on medication and bp is at goal today. He denies chest pain or palpitatoin   DMII: diet controlled since bariatric surgery, but hgbA1C is gradually increasing, resumed diet recently, and wants to wait until next visit to have labs done. He denies polyphagia, polydipsia or polyuria    Patient Active Problem List   Diagnosis Date Noted  . Chronic midline low back pain with sciatica 02/11/2017  . Anemia, unspecified 06/12/2016  . B12 deficiency 06/11/2016  . Allergic conjunctivitis 06/11/2016  . Hypocalcemia 06/11/2016  . Muscle cramps 06/11/2016  . Hypertension, benign 11/03/2015  . Dyslipidemia 11/02/2015  . Acquired nystagmus 08/01/2015  . Chronic back pain 06/29/2015  . Carpal tunnel syndrome 06/29/2015  . Osteoarthritis 06/29/2015  . Claustrophobia 06/29/2015  . Foot drop, left 06/29/2015  . Hiatal hernia 06/29/2015  . H/O diabetes mellitus 06/29/2015  . H/O testicular cancer 06/29/2015  . Acquired hypothyroidism 06/29/2015  . Dysmetabolic syndrome 10/19/2535  .  Morbid obesity ([place]) 06/29/2015  . Allergic rhinitis 06/29/2015  . Neuropathy 06/29/2015  . Seborrheic keratoses 06/29/2015  . Vitamin D deficiency 06/29/2015  . Bariatric surgery status 02/20/2012    Past Surgical History:  Procedure Laterality Date  . BACK SURGERY     X 2  . HERNIA REPAIR     left side and umbilical hernia repair  . 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 URETEROTOMY Left 2000    Family History  Problem Relation Age of Onset  . Coronary artery disease Mother   . Cancer Father        Melanoma and Testicular  . Diabetes Father     Social History   Social  History  . Marital status: Married    Spouse name: N/A  . Number of children: N/A  . Years of education: N/A   Occupational History  . Not on file.   Social History Main Topics  . Smoking status: Former Smoker    Packs/day: 3.00    Years: 8.00    Types: Cigarettes, Cigars, Pipe    Start date: 06/28/1984    Quit date: 12/24/1991  . Smokeless tobacco: Former Systems developer    Types: Chew    Quit date: 12/24/1991  . Alcohol use No  . Drug use: No  . Sexual activity: Yes    Partners: Female   Other Topics Concern  . Not on file   Social History Narrative  . No narrative on file     Current Outpatient Prescriptions:  .  Calcium Carbonate-Vit D-Min (CALTRATE 600+D PLUS MINERALS) 600-800 MG-UNIT CHEW, Chew by mouth., Disp: , Rfl:  .  cyclobenzaprine (FLEXERIL) 10 MG tablet, Take 1 tablet (10 mg total) by mouth at bedtime., Disp: 90 tablet, Rfl: 1 .  diclofenac sodium (VOLTAREN) 1 % GEL, Apply 4 g topically 4 (four) times daily. Reported on 06/11/2016, Disp: 100 g, Rfl: 1 .  Ferrous Sulfate (IRON) 325 (65 Fe) MG TABS, Take 1 tablet by mouth daily., Disp: , Rfl:  .  fexofenadine (ALLEGRA ALLERGY) 180 MG tablet, Take 1 tablet by mouth daily., Disp: , Rfl:  .  HYDROcodone-acetaminophen (NORCO) 7.5-325 MG tablet, Take 1 tablet by mouth 3 (three) times daily as needed for moderate pain., Disp: 80 tablet, Rfl: 0 .  HYDROcodone-acetaminophen (NORCO) 7.5-325 MG tablet, Take 1 tablet by mouth 3 (three) times daily as needed., Disp: 80 tablet, Rfl: 0 .  HYDROcodone-acetaminophen (NORCO) 7.5-325 MG tablet, Take 1 tablet by mouth 3 (three) times daily as needed for moderate pain. Fill April 20th, 2018, Disp: 80 tablet, Rfl: 0 .  ketoconazole (NIZORAL) 2 % cream, Apply 1 application topically daily., Disp: 60 g, Rfl: 1 .  losartan-hydrochlorothiazide (HYZAAR) 50-12.5 MG tablet, Take 1 tablet by mouth daily., Disp: 90 tablet, Rfl: 1 .  LYRICA 225 MG capsule, TAKE 1 CAPSULE 2 TIMES A DAY, Disp: 60 capsule, Rfl:  2 .  Potassium 99 MG TABS, Take by mouth., Disp: , Rfl:  .  SYNTHROID 137 MCG tablet, TAKE 1 TABLET EVERY DAY AND 1 AND ONE-HALF TABLETS ON SUNDAYS, Disp: 90 tablet, Rfl: 0 .  traZODone (DESYREL) 100 MG tablet, Take 1-1.5 tablets (100-150 mg total) by mouth at bedtime., Disp: 135 tablet, Rfl: 1 .  Vitamin D, Ergocalciferol, (DRISDOL) 50000 units CAPS capsule, TAKE ONE CAPSULE EVERY SEVEN DAYS, Disp: 12 capsule, Rfl: 1 .  Homeopathic Products ([employer] LEG CRAMPS PAIN RELIEF PO), Take 2 tablets by mouth 2 (two) times daily at 10 am and  4 pm., Disp: , Rfl:   Allergies  Allergen Reactions  . Morphine Itching     ROS  Constitutional: Negative for fever, positive for  weight change.  Respiratory: Negative for cough and shortness of breath.   Cardiovascular: Negative for chest pain or palpitations.  Gastrointestinal: Negative for abdominal pain, no bowel changes.  Musculoskeletal: Positive for gait problem no  joint swelling.  Skin: Negative for rash.  Neurological: Negative for dizziness or headache.  No other specific complaints in a complete review of systems (except as listed in HPI above).  Objective  Vitals:   08/15/17 1552  BP: 126/88  Pulse: 65  Resp: 16  Temp: 97.9 F (36.6 C)  TempSrc: Oral  SpO2: 97%  Weight: (!) 335 lb 8 oz (152.2 kg)  Height: 5' 9" (1.753 m)    Body mass index is 49.54 kg/m.  Physical Exam  Constitutional: Patient appears well-developed and well-nourished. Obese  No distress.  HEENT: head atraumatic, normocephalic, pupils equal and reactive to light, strabismus , neck supple, throat within normal limits Cardiovascular: Normal rate, regular rhythm and normal heart sounds.  No murmur heard. No BLE edema. Pulmonary/Chest: Effort normal and breath sounds normal. No respiratory distress. Abdominal: Soft.  There is no tenderness. Psychiatric: Patient has a normal mood and affect. behavior is normal. Judgment and thought content normal. Muscular Skeletal:  positive straight leg raise, normal rom, no tenderness during palpation of spine today  Recent Results (from the past 2160 hour(s))  COMPLETE METABOLIC PANEL WITH GFR     Status: Abnormal   Collection Time: 08/01/17 11:06 AM  Result Value Ref Range   Sodium 141 135 - 146 mmol/L   Potassium 4.7 3.5 - 5.3 mmol/L   Chloride 104 98 - 110 mmol/L   CO2 29 20 - 32 mmol/L    Comment: ** Please note change in reference range(s). **      Glucose, Bld 119 (H) 65 - 99 mg/dL   BUN 17 7 - 25 mg/dL   Creat 1.14 0.60 - 1.35 mg/dL   Total Bilirubin 0.8 0.2 - 1.2 mg/dL   Alkaline Phosphatase 82 40 - 115 U/L   AST 23 10 - 40 U/L   ALT 27 9 - 46 U/L   Total Protein 6.6 6.1 - 8.1 g/dL   Albumin 4.2 3.6 - 5.1 g/dL   Calcium 9.2 8.6 - 10.3 mg/dL   GFR, Est African American 89 >=60 mL/min   GFR, Est Non African American 77 >=60 mL/min  TSH     Status: None   Collection Time: 08/01/17 11:06 AM  Result Value Ref Range   TSH 3.44 0.40 - 4.50 mIU/L  Parathyroid hormone, intact (no Ca)     Status: None   Collection Time: 08/01/17 11:06 AM  Result Value Ref Range   PTH 39 14 - 64 pg/mL    Comment:   Interpretive Guide:                              Intact PTH               Calcium                              ----------               ------- Normal Parathyroid  Normal                   Normal Hypoparathyroidism           Low or Low Normal        Low Hyperparathyroidism      Primary                 Normal or High           High      Secondary               High                     Normal or Low      Tertiary                High                     High Non-Parathyroid   Hypercalcemia              Low or Low Normal        High   CBC w/Diff/Platelet     Status: None   Collection Time: 08/01/17 11:06 AM  Result Value Ref Range   WBC 5.2 3.8 - 10.8 K/uL   RBC 5.38 4.20 - 5.80 MIL/uL   Hemoglobin 15.5 13.2 - 17.1 g/dL   HCT 47.6 38.5 - 50.0 %   MCV 88.5 80.0 - 100.0 fL   MCH 28.8 27.0 -  33.0 pg   MCHC 32.6 32.0 - 36.0 g/dL   RDW 14.2 11.0 - 15.0 %   Platelets 164 140 - 400 K/uL   MPV 9.5 7.5 - 12.5 fL   Neutro Abs 2,912 1,500 - 7,800 cells/uL   Lymphs Abs 1,456 850 - 3,900 cells/uL   Monocytes Absolute 624 200 - 950 cells/uL   Eosinophils Absolute 208 15 - 500 cells/uL   Basophils Absolute 0 0 - 200 cells/uL   Neutrophils Relative % 56 %   Lymphocytes Relative 28 %   Monocytes Relative 12 %   Eosinophils Relative 4 %   Basophils Relative 0 %   Smear Review Criteria for review not met      PHQ2/9: Depression screen [facility] 2/9 08/15/2017 05/14/2017 02/11/2017 09/13/2016 06/11/2016  Decreased Interest 0 0 0 0 0  Down, Depressed, Hopeless 0 0 0 0 0  PHQ - 2 Score 0 0 0 0 0    Fall Risk: Fall Risk  08/15/2017 05/14/2017 02/11/2017 09/13/2016 06/11/2016  Falls in the past year? No No No No Yes  Number falls in past yr: - - - - 1  Injury with Fall? - - - - No    Functional Status Survey: Is the patient deaf or have difficulty hearing?: No Does the patient have difficulty seeing, even when wearing glasses/contacts?: No Does the patient have difficulty concentrating, remembering, or making decisions?: No Does the patient have difficulty walking or climbing stairs?: No Does the patient have difficulty dressing or bathing?: No Does the patient have difficulty doing errands alone such as visiting a doctor's office or shopping?: No   Assessment & Plan  1. Hypertension, benign  - losartan-hydrochlorothiazide (HYZAAR) 50-12.5 MG tablet; Take 1 tablet by mouth daily.  Dispense: 90 tablet; Refill: 1  2. Morbid obesity ([place])  Discussed with the patient the risk posed by an increased BMI. Discussed importance of portion control, calorie counting and at least 150 minutes  of physical activity weekly. Avoid sweet beverages and drink more water. Eat at least 6 servings of fruit and vegetables daily   3. Chronic nonmalignant pain  - HYDROcodone-acetaminophen (NORCO) 7.5-325 MG  tablet; Take 1 tablet by mouth 3 (three) times daily as needed for moderate pain.  Dispense: 80 tablet; Refill: 0 - HYDROcodone-acetaminophen (NORCO) 7.5-325 MG tablet; Take 1 tablet by mouth 3 (three) times daily as needed.  Dispense: 80 tablet; Refill: 0 - HYDROcodone-acetaminophen (NORCO) 7.5-325 MG tablet; Take 1 tablet by mouth 3 (three) times daily as needed for moderate pain. Fill April 20th, 2018  Dispense: 80 tablet; Refill: 0  4. Dyslipidemia  Lipid panel shows low HDL : to improve HDL patient  needs to eat tree nuts ( pecans/pistachios/almonds ) four times weekly, eat fish two times weekly  and exercise  at least 150 minutes per week  5. B12 deficiency  Continue B12 supplementation   6. Bariatric surgery status  Lost some weight since last visit, he has resumed a low carbohydrate diet  7. Acquired hypothyroidism  Continue TSH   8. Other insomnia  Sleeping better on trazodone, but only about 6 hours, we will go up on dose - traZODone (DESYREL) 100 MG tablet; Take 1-1.5 tablets (100-150 mg total) by mouth at bedtime.  Dispense: 135 tablet; Refill: 1  9. Needs flu shot  - Flu Vaccine QUAD 36+ mos IM  10. Need for pneumococcal vaccine  - Pneumococcal conjugate vaccine 13-valent IM  11. Tinea pedis of both feet  - ketoconazole (NIZORAL) 2 % cream; Apply 1 application topically daily.  Dispense: 60 g; Refill: 1  12. Chronic midline low back pain with sciatica, sciatica laterality unspecified  - HYDROcodone-acetaminophen (NORCO) 7.5-325 MG tablet; Take 1 tablet by mouth 3 (three) times daily as needed for moderate pain.  Dispense: 80 tablet; Refill: 0 - HYDROcodone-acetaminophen (NORCO) 7.5-325 MG tablet; Take 1 tablet by mouth 3 (three) times daily as needed.  Dispense: 80 tablet; Refill: 0 - HYDROcodone-acetaminophen (NORCO) 7.5-325 MG tablet; Take 1 tablet by mouth 3 (three) times daily as needed for moderate pain. Fill April 20th, 2018  Dispense: 80 tablet; Refill:  0  13. Muscle cramps  - cyclobenzaprine (FLEXERIL) 10 MG tablet; Take 1 tablet (10 mg total) by mouth at bedtime.  Dispense: 90 tablet; Refill: 1  14. Diet-controlled diabetes mellitus ([place])  Last hgbA1C 6.3% and he resumed his diet  15. Primary osteoarthritis of left knee  - Ambulatory referral to Orthopedic Surgery

## 2017-08-29 DIAGNOSIS — M170 Bilateral primary osteoarthritis of knee: Secondary | ICD-10-CM | POA: Diagnosis not present

## 2017-08-29 DIAGNOSIS — S93609A Unspecified sprain of unspecified foot, initial encounter: Secondary | ICD-10-CM | POA: Insufficient documentation

## 2017-08-29 DIAGNOSIS — S93622A Sprain of tarsometatarsal ligament of left foot, initial encounter: Secondary | ICD-10-CM | POA: Diagnosis not present

## 2017-10-17 ENCOUNTER — Other Ambulatory Visit: Payer: Self-pay | Admitting: Family Medicine

## 2017-10-17 ENCOUNTER — Telehealth: Payer: Self-pay | Admitting: Family Medicine

## 2017-10-17 DIAGNOSIS — E039 Hypothyroidism, unspecified: Secondary | ICD-10-CM

## 2017-10-17 NOTE — Telephone Encounter (Signed)
Patient prescriptions were written with the wrong dates. He should have enough medications to last him until December. According to the Controlled Substance Registry, he submitted a Norco prescription for August 24 on August 24 and one for September on September 22. He should have one for October and November still available. Pharmacy was contacted and understood that the dates were written wrong and he should have enough medication to last.

## 2017-10-17 NOTE — Telephone Encounter (Signed)
Pharmacy call regarding a narcotic Rx.

## 2017-10-17 NOTE — Telephone Encounter (Signed)
Copied from [place] [id number]. Topic: Quick Communication - See Telephone Encounter >> Oct 17, 2017 12:36 PM Bea Graff, NT wrote: CRM for notification. See Telephone encounter for:  10/17/17. [facility] is calling with questions about this pts hydrocodone. Looks like they are missing a rx for September. They have an August, October, and November but none for September. Please give Jeral Fruit a call back at  [phone]

## 2017-11-18 ENCOUNTER — Encounter: Payer: Self-pay | Admitting: Family Medicine

## 2017-11-18 ENCOUNTER — Ambulatory Visit (INDEPENDENT_AMBULATORY_CARE_PROVIDER_SITE_OTHER): Payer: BLUE CROSS/BLUE SHIELD | Admitting: Family Medicine

## 2017-11-18 VITALS — BP 140/90 | HR 64 | Resp 14 | Ht 69.0 in | Wt 332.8 lb

## 2017-11-18 DIAGNOSIS — E538 Deficiency of other specified B group vitamins: Secondary | ICD-10-CM

## 2017-11-18 DIAGNOSIS — M5440 Lumbago with sciatica, unspecified side: Secondary | ICD-10-CM | POA: Diagnosis not present

## 2017-11-18 DIAGNOSIS — E785 Hyperlipidemia, unspecified: Secondary | ICD-10-CM

## 2017-11-18 DIAGNOSIS — E119 Type 2 diabetes mellitus without complications: Secondary | ICD-10-CM | POA: Diagnosis not present

## 2017-11-18 DIAGNOSIS — G4709 Other insomnia: Secondary | ICD-10-CM

## 2017-11-18 DIAGNOSIS — R252 Cramp and spasm: Secondary | ICD-10-CM | POA: Diagnosis not present

## 2017-11-18 DIAGNOSIS — E559 Vitamin D deficiency, unspecified: Secondary | ICD-10-CM

## 2017-11-18 DIAGNOSIS — I10 Essential (primary) hypertension: Secondary | ICD-10-CM

## 2017-11-18 DIAGNOSIS — Z9884 Bariatric surgery status: Secondary | ICD-10-CM

## 2017-11-18 DIAGNOSIS — E039 Hypothyroidism, unspecified: Secondary | ICD-10-CM

## 2017-11-18 DIAGNOSIS — G8929 Other chronic pain: Secondary | ICD-10-CM

## 2017-11-18 LAB — POCT GLYCOSYLATED HEMOGLOBIN (HGB A1C): Hemoglobin A1C: 6.4

## 2017-11-18 MED ORDER — HYDROCODONE-ACETAMINOPHEN 7.5-325 MG PO TABS: 1.0000 | ORAL_TABLET | Freq: Three times a day (TID) | ORAL | 0 refills | Status: DC | PRN

## 2017-11-18 MED ORDER — PREGABALIN 225 MG PO CAPS: ORAL_CAPSULE | ORAL | 2 refills | Status: DC

## 2017-11-18 MED ORDER — VITAMIN D (ERGOCALCIFEROL) 1.25 MG (50000 UNIT) PO CAPS: ORAL_CAPSULE | ORAL | 1 refills | Status: DC

## 2017-11-18 MED ORDER — LOSARTAN POTASSIUM-HCTZ 100-12.5 MG PO TABS: 1.0000 | ORAL_TABLET | Freq: Every day | ORAL | 1 refills | Status: DC

## 2017-11-18 MED ORDER — CYCLOBENZAPRINE HCL 10 MG PO TABS: 10.0000 mg | ORAL_TABLET | Freq: Every day | ORAL | 1 refills | Status: DC

## 2017-11-18 MED ORDER — TRAZODONE HCL 100 MG PO TABS: 100.0000 mg | ORAL_TABLET | Freq: Every day | ORAL | 1 refills | Status: DC

## 2017-11-18 NOTE — Progress Notes (Signed)
Name: [name]   MRN: [id number]    DOB: 06-Nov-1971   Date:11/18/2017       Progress Note  Subjective  Chief Complaint  Chief Complaint  Patient presents with  . Hypertension  . Diabetes  . Hypothyroidism  . Back Pain    HPI   Insomnia: he is on Trazodone 100-150 po qhs and it usually helps him sleep and stay asleep, however last night he did not have a hydrocodone pill and did not rest secondary to pain.   Muscle Cramps: he states symptoms have improved with B12, D and calcium supplementation, takes Flexeril qhs, no side effects of medication   Hypothyroidism: taking same dose of Synthroid for many months, last TSH at goal. He has chronic dry skin, no hair loss, no constipation.   Chronic low back pain : he is taking Norco 7.5/325mg  about three times per day, he was on 100 pills through the winter, we decreased to 90 pills, but he is not taking three times daily every day, we will go to and gradually weaned down to 80 pills during the Summer. He denies misuse or selling. Taking Lyrica and he states when he ran out of medication he was unable to sleep because pain was more intense .No longer under disability , working full time and has new insurance. Pain contract singed 02/11/2017 and drug screen done, he is now on a different job, works Theatre manager tractors, but states vibration has not made pain worse but the pain has. Advised to try not to take hydrocodone when off work, so he has enough to take three times daily during the work week.  OA: he still has bilateral knee pain, but left knee is worse , uses Voltaren topically occasionally and also takes hydrodocodone. He states left knee pain is described as aching at times sharp, daily symptoms it also cracks and pops,he went back to Ortho  Neuropathy: he has bilateral daily pain on both legs, aching like, he denies burning or prickly sensation, Lyrica helps.Current pain of 8/10   History of bariatric surgery: surgery  09/2012 was he is morbidly obese his weight at the time was 396 lbs - lowest weight after surgery was 275 lbs, dow a few pounds since last visit, today's weight 332.8lbs..He is now packing his lunch instead of eating out. He is drinking more water now.   HTN: is elevated today, we will adjust bp medication, no chest pain or palpitation.   DMII: diet controlled since bariatric surgery, but hgbA1C is gradually increasing, up to 6.4% today, still off medicatione. He denies polyphagia, polydipsia or polyuria  Due for eye exam    Patient Active Problem List   Diagnosis Date Noted  . Sprain of foot 08/29/2017  . Chronic midline low back pain with sciatica 02/11/2017  . Anemia, unspecified 06/12/2016  . B12 deficiency 06/11/2016  . Allergic conjunctivitis 06/11/2016  . Hypocalcemia 06/11/2016  . Muscle cramps 06/11/2016  . Hypertension, benign 11/03/2015  . Dyslipidemia 11/02/2015  . Acquired nystagmus 08/01/2015  . Chronic back pain 06/29/2015  . Carpal tunnel syndrome 06/29/2015  . Osteoarthritis 06/29/2015  . Claustrophobia 06/29/2015  . Foot drop, left 06/29/2015  . Hiatal hernia 06/29/2015  . H/O diabetes mellitus 06/29/2015  . H/O testicular cancer 06/29/2015  . Acquired hypothyroidism 06/29/2015  . Dysmetabolic syndrome 81/27/5170  . Morbid obesity ([place]) 06/29/2015  . Allergic rhinitis 06/29/2015  . Neuropathy 06/29/2015  . Seborrheic keratoses 06/29/2015  . Vitamin D deficiency 06/29/2015  . Bariatric  surgery status 02/20/2012    Past Surgical History:  Procedure Laterality Date  . BACK SURGERY     X 2  . HERNIA REPAIR     left side and umbilical hernia repair  . 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 URETEROTOMY Left 2000    Family History  Problem Relation Age of Onset  . Coronary artery disease Mother   . Cancer Father        Melanoma and Testicular  . Diabetes Father     Social History   Socioeconomic History  . Marital status: Married    Spouse name:  Not on file  . Number of children: Not on file  . Years of education: Not on file  . Highest education level: Not on file  Social Needs  . Financial resource strain: Not on file  . Food insecurity - worry: Not on file  . Food insecurity - inability: Not on file  . Transportation needs - medical: Not on file  . Transportation needs - non-medical: Not on file  Occupational History  . Not on file  Tobacco Use  . Smoking status: Former Smoker    Packs/day: 3.00    Years: 8.00    Pack years: 24.00    Types: Cigarettes, Cigars, Pipe    Start date: 06/28/1984    Last attempt to quit: 12/24/1991    Years since quitting: 25.9  . Smokeless tobacco: Former Systems developer    Types: [place] date: 12/24/1991  Substance and Sexual Activity  . Alcohol use: No    Alcohol/week: 0.0 oz  . Drug use: No  . Sexual activity: Yes    Partners: Female  Other Topics Concern  . Not on file  Social History Narrative  . Not on file     Current Outpatient Medications:  .  Calcium Carbonate-Vit D-Min (CALTRATE 600+D PLUS MINERALS) 600-800 MG-UNIT CHEW, Chew by mouth., Disp: , Rfl:  .  cyclobenzaprine (FLEXERIL) 10 MG tablet, Take 1 tablet (10 mg total) by mouth at bedtime., Disp: 90 tablet, Rfl: 1 .  diclofenac sodium (VOLTAREN) 1 % GEL, Apply 4 g topically 4 (four) times daily. Reported on 06/11/2016, Disp: 100 g, Rfl: 1 .  Ferrous Sulfate (IRON) 325 (65 Fe) MG TABS, Take 1 tablet by mouth daily., Disp: , Rfl:  .  fexofenadine (ALLEGRA ALLERGY) 180 MG tablet, Take 1 tablet by mouth daily., Disp: , Rfl:  .  Homeopathic Products ([employer] LEG CRAMPS PAIN RELIEF PO), Take 2 tablets by mouth 2 (two) times daily at 10 am and 4 pm., Disp: , Rfl:  .  HYDROcodone-acetaminophen (NORCO) 7.5-325 MG tablet, Take 1 tablet by mouth 3 (three) times daily as needed for moderate pain., Disp: 80 tablet, Rfl: 0 .  HYDROcodone-acetaminophen (NORCO) 7.5-325 MG tablet, Take 1 tablet by mouth 3 (three) times daily as needed., Disp: 80  tablet, Rfl: 0 .  HYDROcodone-acetaminophen (NORCO) 7.5-325 MG tablet, Take 1 tablet by mouth 3 (three) times daily as needed for moderate pain. Fill April 20th, 2018, Disp: 80 tablet, Rfl: 0 .  ketoconazole (NIZORAL) 2 % cream, Apply 1 application topically daily., Disp: 60 g, Rfl: 1 .  losartan-hydrochlorothiazide (HYZAAR) 100-12.5 MG tablet, Take 1 tablet by mouth daily., Disp: 90 tablet, Rfl: 1 .  Potassium 99 MG TABS, Take by mouth., Disp: , Rfl:  .  pregabalin (LYRICA) 225 MG capsule, TAKE 1 CAPSULE 2 TIMES A DAY, Disp: 60 capsule, Rfl: 2 .  SYNTHROID 137 MCG tablet,  TAKE 1 TABLET EVERY DAY AND 1 AND ONE-HALF TABLETS ON SUNDAYS, Disp: 90 tablet, Rfl: 0 .  traZODone (DESYREL) 100 MG tablet, Take 1-1.5 tablets (100-150 mg total) by mouth at bedtime., Disp: 135 tablet, Rfl: 1 .  Vitamin D, Ergocalciferol, (DRISDOL) 50000 units CAPS capsule, TAKE ONE CAPSULE EVERY SEVEN DAYS, Disp: 12 capsule, Rfl: 1  Allergies  Allergen Reactions  . Morphine Itching     ROS  Constitutional: Negative for fever or significant weight change.  Respiratory: Negative for cough and shortness of breath.   Cardiovascular: Negative for chest pain or palpitations.  Gastrointestinal: Negative for abdominal pain, no bowel changes.  Musculoskeletal: Positive  for gait problem or joint swelling.  Skin: Negative for rash.  Neurological: Negative for dizziness or headache.  No other specific complaints in a complete review of systems (except as listed in HPI above).  Objective  Vitals:   11/18/17 0815  BP: 140/90  Pulse: 64  Resp: 14  SpO2: 97%  Weight: (!) 332 lb 12.8 oz (151 kg)  Height: 5\' 9"  (1.753 m)    Body mass index is 49.15 kg/m.  Physical Exam  Constitutional: Patient appears well-developed and well-nourished. Obese No distress.  HEENT: head atraumatic, normocephalic, pupils equal and reactive to light, strabismus , neck supple, throat within normal limits Cardiovascular: Normal rate,  regular rhythm and normal heart sounds. No murmur heard. No BLE edema. Pulmonary/Chest: Effort normal and breath sounds normal. No respiratory distress. Abdominal: Soft. There is no tenderness. Psychiatric: Patient has a normal mood and affect. behavior is normal. Judgment and thought content normal. Muscular Skeletal: positive straight leg raise, normal rom, no tenderness during palpation of spine today   Recent Results (from the past 2160 hour(s))  POCT HgB A1C     Status: Abnormal   Collection Time: 11/18/17  8:21 AM  Result Value Ref Range   Hemoglobin A1C 6.4      PHQ2/9: Depression screen [facility] 2/9 08/15/2017 05/14/2017 02/11/2017 09/13/2016 06/11/2016  Decreased Interest 0 0 0 0 0  Down, Depressed, Hopeless 0 0 0 0 0  PHQ - 2 Score 0 0 0 0 0     Fall Risk: Fall Risk  11/18/2017 08/15/2017 05/14/2017 02/11/2017 09/13/2016  Falls in the past year? No No No No No  Number falls in past yr: - - - - -  Injury with Fall? - - - - -     Functional Status Survey: Is the patient deaf or have difficulty hearing?: No Does the patient have difficulty seeing, even when wearing glasses/contacts?: No Does the patient have difficulty concentrating, remembering, or making decisions?: No Does the patient have difficulty walking or climbing stairs?: No Does the patient have difficulty dressing or bathing?: No Does the patient have difficulty doing errands alone such as visiting a doctor's office or shopping?: No   Assessment & Plan  1. Diet-controlled diabetes mellitus ([place])  - POCT HgB A1C Still controlled with diet, continue to monitor  2. Hypertension, benign  BP elevated we will adjust dose of Losartan/hctz  3. Morbid obesity ([place])   4. Chronic nonmalignant pain  - HYDROcodone-acetaminophen (NORCO) 7.5-325 MG tablet; Take 1 tablet by mouth 3 (three) times daily as needed for moderate pain.  Dispense: 80 tablet; Refill: 0 - HYDROcodone-acetaminophen (NORCO) 7.5-325 MG tablet;  Take 1 tablet by mouth 3 (three) times daily as needed.  Dispense: 80 tablet; Refill: 0 - HYDROcodone-acetaminophen (NORCO) 7.5-325 MG tablet; Take 1 tablet by mouth 3 (three) times daily as  needed for moderate pain. Fill April 20th, 2018  Dispense: 80 tablet; Refill: 0 - pregabalin (LYRICA) 225 MG capsule; TAKE 1 CAPSULE 2 TIMES A DAY  Dispense: 60 capsule; Refill: 2  5. Dyslipidemia  Lipid panel shows low HDL : to improve HDL patient  needs to eat tree nuts ( pecans/pistachios/almonds ) four times weekly, eat fish two times weekly  and exercise  at least 150 minutes per week  6. B12 deficiency  Continue B12 supplementation   7. Bariatric surgery status  Lost a few pounds since last visit  8. Acquired hypothyroidism  Continue current dose of Synthroid, last TSH 3 months ago   9. Muscle cramps  - cyclobenzaprine (FLEXERIL) 10 MG tablet; Take 1 tablet (10 mg total) by mouth at bedtime.  Dispense: 90 tablet; Refill: 1  10. Chronic midline low back pain with sciatica, sciatica laterality unspecified  - HYDROcodone-acetaminophen (NORCO) 7.5-325 MG tablet; Take 1 tablet by mouth 3 (three) times daily as needed for moderate pain.  Dispense: 80 tablet; Refill: 0 - HYDROcodone-acetaminophen (NORCO) 7.5-325 MG tablet; Take 1 tablet by mouth 3 (three) times daily as needed.  Dispense: 80 tablet; Refill: 0 - HYDROcodone-acetaminophen (NORCO) 7.5-325 MG tablet; Take 1 tablet by mouth 3 (three) times daily as needed for moderate pain. Fill April 20th, 2018  Dispense: 80 tablet; Refill: 0 - pregabalin (LYRICA) 225 MG capsule; TAKE 1 CAPSULE 2 TIMES A DAY  Dispense: 60 capsule; Refill: 2  11. Other insomnia  - traZODone (DESYREL) 100 MG tablet; Take 1-1.5 tablets (100-150 mg total) by mouth at bedtime.  Dispense: 135 tablet; Refill: 1  12. Vitamin D deficiency  - Vitamin D, Ergocalciferol, (DRISDOL) 50000 units CAPS capsule; TAKE ONE CAPSULE EVERY SEVEN DAYS  Dispense: 12 capsule; Refill: 1

## 2017-11-21 ENCOUNTER — Emergency Department
Admission: EM | Admit: 2017-11-21 | Discharge: 2017-11-21 | Disposition: A | Discharge status: Home / Self Care | Payer: Medicare Other | Attending: Emergency Medicine | Admitting: Emergency Medicine

## 2017-11-21 ENCOUNTER — Encounter: Payer: Self-pay | Admitting: *Deleted

## 2017-11-21 DIAGNOSIS — E039 Hypothyroidism, unspecified: Secondary | ICD-10-CM | POA: Insufficient documentation

## 2017-11-21 DIAGNOSIS — H1031 Unspecified acute conjunctivitis, right eye: Secondary | ICD-10-CM | POA: Insufficient documentation

## 2017-11-21 DIAGNOSIS — Z87891 Personal history of nicotine dependence: Secondary | ICD-10-CM | POA: Insufficient documentation

## 2017-11-21 DIAGNOSIS — H02842 Edema of right lower eyelid: Secondary | ICD-10-CM | POA: Insufficient documentation

## 2017-11-21 DIAGNOSIS — H5710 Ocular pain, unspecified eye: Secondary | ICD-10-CM

## 2017-11-21 DIAGNOSIS — Z79899 Other long term (current) drug therapy: Secondary | ICD-10-CM | POA: Insufficient documentation

## 2017-11-21 DIAGNOSIS — H5711 Ocular pain, right eye: Secondary | ICD-10-CM | POA: Diagnosis not present

## 2017-11-21 MED ORDER — TETRACAINE HCL 0.5 % OP SOLN
2.0000 [drp] | Freq: Once | OPHTHALMIC | Status: DC
  Filled 2017-11-21: qty 4

## 2017-11-21 MED ORDER — TOBRAMYCIN 0.3 % OP SOLN: 2.0000 [drp] | OPHTHALMIC | 0 refills | Status: DC

## 2017-11-21 MED ORDER — FLUORESCEIN SODIUM 1 MG OP STRP
1.0000 | ORAL_STRIP | Freq: Once | OPHTHALMIC | Status: DC
  Filled 2017-11-21: qty 1

## 2017-11-21 MED ORDER — EYE WASH OPHTH SOLN
1.0000 [drp] | OPHTHALMIC | Status: DC | PRN
  Filled 2017-11-21: qty 118

## 2017-11-21 NOTE — Discharge Instructions (Signed)
On your regular doctor or the eye doctor if you are not better in 3-5 days, return to the emergency department here having any change in your vision, apply a warm compress to the right eye, if there is increased swelling please apply ice

## 2017-11-21 NOTE — ED Provider Notes (Signed)
[facility] Emergency Department Provider Note  ____________________________________________   None    (approximate)  I have reviewed the triage vital signs and the nursing notes.   HISTORY  Chief Complaint Eye Pain    HPI [name] is a 46 y.o. 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 Allergy   . Hiatal hernia   . Hypothyroidism   . Metabolic syndrome   . Obesity   . OSA on CPAP    11 mmhg CPAP  . Radiculopathy   . Seborrhea   . Testicular hypofunction   . Urine ketone   . Vitamin D deficiency     Patient Active Problem List   Diagnosis Date Noted  . Sprain of foot 08/29/2017  . Chronic midline low back pain with sciatica 02/11/2017  . Anemia, unspecified 06/12/2016  . B12 deficiency 06/11/2016  . Allergic conjunctivitis 06/11/2016  . Hypocalcemia 06/11/2016  . Muscle cramps 06/11/2016  . Hypertension, benign 11/03/2015  . Dyslipidemia 11/02/2015  . Acquired nystagmus 08/01/2015  . Chronic back pain 06/29/2015  . Carpal tunnel syndrome 06/29/2015  . Osteoarthritis 06/29/2015  . Claustrophobia 06/29/2015  . Foot drop, left 06/29/2015  . Hiatal hernia 06/29/2015  . H/O diabetes mellitus 06/29/2015  . H/O testicular cancer 06/29/2015  . Acquired hypothyroidism 06/29/2015  . Dysmetabolic syndrome 78/29/5621  . Morbid obesity ([name]) 06/29/2015  . Allergic rhinitis 06/29/2015  . Neuropathy 06/29/2015  . Seborrheic keratoses 06/29/2015  . Vitamin D deficiency 06/29/2015  . Bariatric surgery status 02/20/2012    Past Surgical History:  Procedure Laterality Date  . BACK SURGERY     X 2  . HERNIA REPAIR     left side and umbilical hernia repair  . ROUX-EN-Y GASTRIC  BYPASS  2.28.13  . URETEROTOMY Left 2000    Prior to Admission medications   Medication Sig Start Date End Date Taking? Authorizing Provider  Calcium Carbonate-Vit D-Min (CALTRATE 600+D PLUS MINERALS) 600-800 MG-UNIT CHEW Chew by mouth.    [provider]  cyclobenzaprine (FLEXERIL) 10 MG tablet Take 1 tablet (10 mg total) by mouth at bedtime. 11/18/17   [name], MD  diclofenac sodium (VOLTAREN) 1 % GEL Apply 4 g topically 4 (four) times daily. Reported on 06/11/2016 09/13/16   [name], MD  Ferrous Sulfate (IRON) 325 (65 Fe) MG TABS Take 1 tablet by mouth daily.    [provider]  fexofenadine (ALLEGRA ALLERGY) 180 MG tablet Take 1 tablet by mouth daily.    [provider]  Homeopathic Products ([employer] LEG CRAMPS PAIN RELIEF PO) Take 2 tablets by mouth 2 (two) times daily at 10 am and 4 pm.    [provider]  HYDROcodone-acetaminophen (NORCO) 7.5-325 MG tablet Take 1 tablet by mouth 3 (three) times daily as needed for moderate pain. 11/18/17   [name], MD  HYDROcodone-acetaminophen (NORCO) 7.5-325 MG tablet Take 1 tablet by mouth 3 (three) times daily as needed. 11/18/17   [name], MD  HYDROcodone-acetaminophen (NORCO) 7.5-325 MG tablet Take 1 tablet by mouth 3 (three) times daily as needed for moderate pain. Fill April 20th, 2018 11/18/17   [name], MD  ketoconazole (NIZORAL) 2 % cream Apply 1 application topically  daily. 08/15/17   [name], MD  losartan-hydrochlorothiazide (HYZAAR) 100-12.5 MG tablet Take 1 tablet by mouth daily. 11/18/17   [name], MD  Potassium 99 MG TABS Take by mouth.    [provider]  pregabalin (LYRICA) 225 MG capsule TAKE 1 CAPSULE 2 TIMES A DAY 11/18/17   Sowles, Drue Stager, MD  SYNTHROID 137 MCG tablet TAKE 1 TABLET EVERY DAY AND 1 AND ONE-HALF TABLETS ON SUNDAYS 10/17/17   Sowles, Drue Stager, MD  tobramycin (TOBREX) 0.3 % ophthalmic solution Place 2 drops into the left eye  every 4 (four) hours. 11/21/17   Elma Limas, Linden Dolin, PA-C  traZODone (DESYREL) 100 MG tablet Take 1-1.5 tablets (100-150 mg total) by mouth at bedtime. 11/18/17   [name], MD  Vitamin D, Ergocalciferol, (DRISDOL) 50000 units CAPS capsule TAKE ONE CAPSULE EVERY SEVEN DAYS 11/18/17   [name], MD    Allergies Morphine  Family History  Problem Relation Age of Onset  . Coronary artery disease Mother   . Cancer Father        Melanoma and Testicular  . Diabetes Father     Social History Social History   Tobacco Use  . Smoking status: Former Smoker    Packs/day: 3.00    Years: 8.00    Pack years: 24.00    Types: Cigarettes, Cigars, Pipe    Start date: 06/28/1984    Last attempt to quit: 12/24/1991    Years since quitting: 25.9  . Smokeless tobacco: Former Systems developer    Types: Chew    Quit date: 12/24/1991  Substance Use Topics  . Alcohol use: No    Alcohol/week: 0.0 oz  . Drug use: No    Review of Systems  Constitutional: No fever/chills Eyes: No visual changes. Positive right eye pain and irritation ENT: No sore throat. Respiratory: Denies cough Genitourinary: Negative for dysuria. Musculoskeletal: Negative for back pain. Skin: Negative for rash.    ____________________________________________   PHYSICAL EXAM:  VITAL SIGNS: ED Triage Vitals  Enc Vitals Group     BP 11/21/17 1057 106/73     Pulse Rate 11/21/17 1057 69     Resp 11/21/17 1057 16     Temp 11/21/17 1057 98.3 F (36.8 C)     Temp Source 11/21/17 1057 Oral     SpO2 11/21/17 1057 98 %     Weight 11/21/17 1056 (!) 332 lb (150.6 kg)     Height 11/21/17 1056 5\' 9"  (1.753 m)     Head Circumference --      Peak Flow --      Pain Score 11/21/17 1056 3     Pain Loc --      Pain Edu? --      Excl. in [place]? --     Constitutional: Alert and oriented. Well appearing and in no acute distress. Eyes: Conjunctivae are injected, no foreign body is noted, no abrasions are noted, the lower lid is mildly  swollen.  Head: Atraumatic. Nose: No congestion/rhinnorhea. Mouth/Throat: Mucous membranes are moist.   Cardiovascular: Normal rate, regular rhythm. Respiratory: Normal respiratory effort.  No retractions GU: deferred Musculoskeletal: FROM all extremities, warm and well perfused Neurologic:  Normal speech and language.  Skin:  Skin is warm, dry and intact. No rash noted. Psychiatric: Mood and affect are normal. Speech and behavior are normal.  ____________________________________________   LABS (all labs ordered are listed, but only abnormal results are displayed)  Labs Reviewed - No data to display ____________________________________________   ____________________________________________  RADIOLOGY    ____________________________________________   PROCEDURES  Procedure(s) performed: Tetracaine 2 drops to the right eye, forcing staining, no abrasion noted, used eye wash to rinse the right eye, patient tolerated procedure well      ____________________________________________   INITIAL IMPRESSION / ASSESSMENT AND PLAN / ED COURSE  Pertinent labs & imaging results that were available during my care of the patient were reviewed by me and considered in my medical decision making (see chart for details).  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, he had concerns of a foreign body in the eye, on my exam there is no foreign body noted no abrasion noted, the conjunctiva are irritated in the right lower lid is slightly swollen, diagnosis of acute conjunctivitis, prescription for tobramycin ophthalmic drops given, he was instructed to follow-up with the eye doctor if he is not better in 3-5 days, return to the emergency department if his vision is changing, or if his eye pain increases      ____________________________________________   FINAL CLINICAL IMPRESSION(S) / ED  DIAGNOSES  Final diagnoses:  Acute conjunctivitis of right eye, unspecified acute conjunctivitis type  Pain in eye, unspecified laterality      NEW MEDICATIONS STARTED DURING THIS VISIT:  This SmartLink is deprecated. Use AVSMEDLIST instead to display the medication list for a patient.   Note:  This document was prepared using Dragon voice recognition software and may include unintentional dictation errors.    Versie Starks, PA-C 11/21/17 1328    Lisa Roca, MD 11/21/17 [phone]

## 2017-11-21 NOTE — ED Triage Notes (Signed)
States right eye pain and swelling for 2 days, states he has been doing a lot of wood work and feels something may have gotten in it

## 2017-12-17 ENCOUNTER — Other Ambulatory Visit: Payer: Self-pay | Admitting: Family Medicine

## 2017-12-17 DIAGNOSIS — G8929 Other chronic pain: Secondary | ICD-10-CM

## 2017-12-17 DIAGNOSIS — M5440 Lumbago with sciatica, unspecified side: Secondary | ICD-10-CM

## 2017-12-17 DIAGNOSIS — I10 Essential (primary) hypertension: Secondary | ICD-10-CM

## 2017-12-17 NOTE — Telephone Encounter (Signed)
Refill request for general medication. Lyrica and Losartan-HCTZ. But patient should have enough refills on file. Please review as well. Thanks.   Last office visit: 11/18/2017  Follow up 02/18/2018.

## 2017-12-18 NOTE — Telephone Encounter (Signed)
Refill request is too soon.

## 2018-01-17 ENCOUNTER — Other Ambulatory Visit: Payer: Self-pay | Admitting: Family Medicine

## 2018-01-17 DIAGNOSIS — I10 Essential (primary) hypertension: Secondary | ICD-10-CM

## 2018-01-17 DIAGNOSIS — E039 Hypothyroidism, unspecified: Secondary | ICD-10-CM

## 2018-02-14 ENCOUNTER — Other Ambulatory Visit: Payer: Self-pay | Admitting: Family Medicine

## 2018-02-14 DIAGNOSIS — I10 Essential (primary) hypertension: Secondary | ICD-10-CM

## 2018-02-14 DIAGNOSIS — E039 Hypothyroidism, unspecified: Secondary | ICD-10-CM

## 2018-02-15 ENCOUNTER — Encounter: Payer: Self-pay | Admitting: Emergency Medicine

## 2018-02-15 ENCOUNTER — Emergency Department: Payer: Medicare Other

## 2018-02-15 ENCOUNTER — Emergency Department
Admission: EM | Admit: 2018-02-15 | Discharge: 2018-02-15 | Disposition: A | Discharge status: Home / Self Care | Payer: Medicare Other | Attending: Emergency Medicine | Admitting: Emergency Medicine

## 2018-02-15 DIAGNOSIS — L089 Local infection of the skin and subcutaneous tissue, unspecified: Secondary | ICD-10-CM | POA: Insufficient documentation

## 2018-02-15 DIAGNOSIS — Z9884 Bariatric surgery status: Secondary | ICD-10-CM | POA: Insufficient documentation

## 2018-02-15 DIAGNOSIS — Z8547 Personal history of malignant neoplasm of testis: Secondary | ICD-10-CM | POA: Insufficient documentation

## 2018-02-15 DIAGNOSIS — R05 Cough: Secondary | ICD-10-CM | POA: Insufficient documentation

## 2018-02-15 DIAGNOSIS — Z79899 Other long term (current) drug therapy: Secondary | ICD-10-CM | POA: Insufficient documentation

## 2018-02-15 DIAGNOSIS — B9789 Other viral agents as the cause of diseases classified elsewhere: Secondary | ICD-10-CM

## 2018-02-15 DIAGNOSIS — I10 Essential (primary) hypertension: Secondary | ICD-10-CM | POA: Insufficient documentation

## 2018-02-15 DIAGNOSIS — E039 Hypothyroidism, unspecified: Secondary | ICD-10-CM | POA: Insufficient documentation

## 2018-02-15 DIAGNOSIS — Z87891 Personal history of nicotine dependence: Secondary | ICD-10-CM | POA: Insufficient documentation

## 2018-02-15 DIAGNOSIS — Y990 Civilian activity done for income or pay: Secondary | ICD-10-CM | POA: Insufficient documentation

## 2018-02-15 DIAGNOSIS — J069 Acute upper respiratory infection, unspecified: Secondary | ICD-10-CM | POA: Insufficient documentation

## 2018-02-15 DIAGNOSIS — E119 Type 2 diabetes mellitus without complications: Secondary | ICD-10-CM | POA: Insufficient documentation

## 2018-02-15 DIAGNOSIS — Y9289 Other specified places as the place of occurrence of the external cause: Secondary | ICD-10-CM | POA: Insufficient documentation

## 2018-02-15 DIAGNOSIS — Y9389 Activity, other specified: Secondary | ICD-10-CM | POA: Insufficient documentation

## 2018-02-15 DIAGNOSIS — W231XXA Caught, crushed, jammed, or pinched between stationary objects, initial encounter: Secondary | ICD-10-CM | POA: Insufficient documentation

## 2018-02-15 DIAGNOSIS — S60511A Abrasion of right hand, initial encounter: Secondary | ICD-10-CM | POA: Insufficient documentation

## 2018-02-15 DIAGNOSIS — S60221A Contusion of right hand, initial encounter: Secondary | ICD-10-CM | POA: Insufficient documentation

## 2018-02-15 MED ORDER — CEPHALEXIN 500 MG PO CAPS: 500.0000 mg | ORAL_CAPSULE | Freq: Four times a day (QID) | ORAL | 0 refills | Status: DC

## 2018-02-15 MED ORDER — BENZONATATE 100 MG PO CAPS
200.0000 mg | ORAL_CAPSULE | Freq: Once | ORAL | Status: AC
  Administered 2018-02-15: 200 mg via ORAL
  Filled 2018-02-15: qty 2

## 2018-02-15 MED ORDER — GUAIFENESIN-CODEINE 100-10 MG/5ML PO SOLN: 5.0000 mL | ORAL | 0 refills | Status: DC | PRN

## 2018-02-15 NOTE — ED Triage Notes (Signed)
Pt reports about 4 days ago he pinched his right hand in between something causing an abrasion and now he thinks it is infected. Redness and swelling noted to right hand lateral. Pt also reports cold like sx's for the past week.

## 2018-02-15 NOTE — Discharge Instructions (Signed)
Follow up with your primary care doctor if any continued problems.  Begin taking Keflex 500 mg 4 times daily for 7 days.  Robitussin-AC for cough as needed.  This medication contains codeine and can cause drowsiness.  Do not take this medication and drive.  Increase fluids.  Also warm compresses or soak your hand in warm water 2-3 times per day.  Area should continue to improve.  You may take Tylenol or ibuprofen as needed for hand pain.

## 2018-02-15 NOTE — ED Notes (Signed)
Dry guaze applied to R hand wound

## 2018-02-15 NOTE — ED Notes (Signed)
See triage note  Presents with chest congestion and cough  denies any fever with these sx's  Also has a red,swollen area to lateral aspect of right hand   States he had gotten his hand stuck between a something    Had a blood blister and he popped it  Hand is now mow swollen and red

## 2018-02-15 NOTE — ED Provider Notes (Signed)
[facility] Emergency Department Provider Note  ____________________________________________   First MD Initiated Contact with Patient 02/15/18 1525     (approximate)  I have reviewed the triage vital signs and the nursing notes.   HISTORY  Chief Complaint Chills and Hand Injury   HPI [name] is a 47 y.o. male is here with complaint of injury to his right hand that occurred 4 days ago.  Patient states that he pinched his hand between a piece of stone and a piece of steel as he works in [employer].  He states it began getting red and swollen.  He is not been aware of any drainage from the area.  He is unaware of any foreign body that caused the open wound.  He denies any paresthesias into his digits.  Patient also complains of cold symptoms for the past week.  Currently he rates his pain as a 2/10.  Patient has had a tetanus booster within the last 10 years.   Past Medical History:  Diagnosis Date  . Allergy   . Hiatal hernia   . Hypothyroidism   . Metabolic syndrome   . Obesity   . OSA on CPAP    11 mmhg CPAP  . Radiculopathy   . Seborrhea   . Testicular hypofunction   . Urine ketone   . Vitamin D deficiency     Patient Active Problem List   Diagnosis Date Noted  . Sprain of foot 08/29/2017  . Chronic midline low back pain with sciatica 02/11/2017  . Anemia, unspecified 06/12/2016  . B12 deficiency 06/11/2016  . Allergic conjunctivitis 06/11/2016  . Hypocalcemia 06/11/2016  . Muscle cramps 06/11/2016  . Hypertension, benign 11/03/2015  . Dyslipidemia 11/02/2015  . Acquired nystagmus 08/01/2015  . Chronic back pain 06/29/2015  . Carpal tunnel syndrome 06/29/2015  . Osteoarthritis 06/29/2015  . Claustrophobia 06/29/2015  . Foot drop, left 06/29/2015  . Hiatal hernia 06/29/2015  . H/O diabetes mellitus 06/29/2015  . H/O testicular cancer 06/29/2015  . Acquired hypothyroidism 06/29/2015  . Dysmetabolic syndrome 52/77/8242  .  Morbid obesity ([place]) 06/29/2015  . Allergic rhinitis 06/29/2015  . Neuropathy 06/29/2015  . Seborrheic keratoses 06/29/2015  . Vitamin D deficiency 06/29/2015  . Bariatric surgery status 02/20/2012    Past Surgical History:  Procedure Laterality Date  . BACK SURGERY     X 2  . HERNIA REPAIR     left side and umbilical hernia repair  . 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 URETEROTOMY Left 2000    Prior to Admission medications   Medication Sig Start Date End Date Taking? Authorizing Provider  Calcium Carbonate-Vit D-Min (CALTRATE 600+D PLUS MINERALS) 600-800 MG-UNIT CHEW Chew by mouth.    [provider]  cephALEXin (KEFLEX) 500 MG capsule Take 1 capsule (500 mg total) by mouth 4 (four) times daily. 02/15/18   Johnn Hai, PA-C  diclofenac sodium (VOLTAREN) 1 % GEL Apply 4 g topically 4 (four) times daily. Reported on 06/11/2016 09/13/16   [name], MD  Ferrous Sulfate (IRON) 325 (65 Fe) MG TABS Take 1 tablet by mouth daily.    [provider]  fexofenadine (ALLEGRA ALLERGY) 180 MG tablet Take 1 tablet by mouth daily.    [provider]  guaiFENesin-codeine 100-10 MG/5ML syrup Take 5 mLs by mouth every 4 (four) hours as needed. 02/15/18   Johnn Hai, PA-C  Homeopathic Products ([employer] LEG CRAMPS PAIN RELIEF PO) Take 2 tablets by mouth 2 (  two) times daily at 10 am and 4 pm.    [provider]  ketoconazole (NIZORAL) 2 % cream Apply 1 application topically daily. 08/15/17   [name], MD  losartan-hydrochlorothiazide (HYZAAR) 100-12.5 MG tablet Take 1 tablet by mouth daily. 11/18/17   [name], MD  Potassium 99 MG TABS Take by mouth.    [provider]  pregabalin (LYRICA) 225 MG capsule TAKE 1 CAPSULE 2 TIMES A DAY 11/18/17   Sowles, Drue Stager, MD  SYNTHROID 137 MCG tablet TAKE 1 TABLET EVERY DAY AND 1 AND ONE-HALF TABLETS ON SUNDAYS 01/18/18   Sowles, Drue Stager, MD  tobramycin (TOBREX) 0.3 % ophthalmic solution Place 2  drops into the left eye every 4 (four) hours. 11/21/17   Fisher, Linden Dolin, PA-C  traZODone (DESYREL) 100 MG tablet Take 1-1.5 tablets (100-150 mg total) by mouth at bedtime. 11/18/17   [name], MD  Vitamin D, Ergocalciferol, (DRISDOL) 50000 units CAPS capsule TAKE ONE CAPSULE EVERY SEVEN DAYS 11/18/17   [name], MD    Allergies Morphine  Family History  Problem Relation Age of Onset  . Coronary artery disease Mother   . Cancer Father        Melanoma and Testicular  . Diabetes Father     Social History Social History   Tobacco Use  . Smoking status: Former Smoker    Packs/day: 3.00    Years: 8.00    Pack years: 24.00    Types: Cigarettes, Cigars, Pipe    Start date: 06/28/1984    Last attempt to quit: 12/24/1991    Years since quitting: 26.1  . Smokeless tobacco: Former Systems developer    Types: Chew    Quit date: 12/24/1991  Substance Use Topics  . Alcohol use: No    Alcohol/week: 0.0 oz  . Drug use: No    Review of Systems Constitutional: No fever/chills ENT: Positive nasal congestion. Cardiovascular: Denies chest pain. Respiratory: Denies shortness of breath. Gastrointestinal:   No nausea, no vomiting.   Musculoskeletal: Hand pain. Skin: Open wound right hand. Neurological: Negative for headaches, focal weakness or numbness. ___________________________________________   PHYSICAL EXAM:  VITAL SIGNS: ED Triage Vitals  Enc Vitals Group     BP 02/15/18 1327 116/86     Pulse Rate 02/15/18 1327 80     Resp 02/15/18 1327 20     Temp 02/15/18 1327 98.4 F (36.9 C)     Temp Source 02/15/18 1327 Oral     SpO2 02/15/18 1327 96 %     Weight 02/15/18 1328 (!) 325 lb (147.4 kg)     Height 02/15/18 1328 5\' 9"  (1.753 m)     Head Circumference --      Peak Flow --      Pain Score 02/15/18 1328 2     Pain Loc --      Pain Edu? --      Excl. in [place]? --    Constitutional: Alert and oriented. Well appearing and in no acute distress. Eyes: Conjunctivae are normal.    Head: Atraumatic. Nose: Mild congestion/rhinnorhea. Mouth/Throat: Mucous membranes are moist.  Oropharynx non-erythematous. Neck: No stridor.   Cardiovascular: Normal rate, regular rhythm. Grossly normal heart sounds.  Good peripheral circulation. Respiratory: Normal respiratory effort.  No retractions. Lungs CTAB. Musculoskeletal: Examination of the right hand there is a open wound on the ulnar aspect of the right hand.  There is no drainage.  Area is red and angry without fever.  No foreign body is noted.  Digits distal to the injury or without restriction on range of motion and motor sensory function intact. Neurologic:  Normal speech and language. No gross focal neurologic deficits are appreciated.  Skin:  Skin is warm, dry.  Single open wound on the right hand as noted above. Psychiatric: Mood and affect are normal. Speech and behavior are normal.  ____________________________________________   LABS (all labs ordered are listed, but only abnormal results are displayed)  Labs Reviewed - No data to display ____________________________________________ RADIOLOGY  ED MD interpretation:  Right hand x-ray is negative for fracture or foreign body.  Official radiology report(s): Dg Hand Complete Right  Result Date: 02/15/2018 CLINICAL DATA:  Pain/injury with open wound. EXAM: RIGHT HAND - COMPLETE 3+ VIEW COMPARISON:  Right wrist radiographs 09/01/2007. FINDINGS: Soft tissue swelling is noted over the dorsal and ulnar aspect of the hand and wrist. There is no underlying fracture. No radiopaque foreign body is present. IMPRESSION: 1. Soft tissue swelling over the dorsal and ulnar aspect of the hand without underlying fracture or radiopaque foreign body. Electronically Signed   By: San Morelle M.D.   On: 02/15/2018 16:09  ____________________________________________   PROCEDURES  Procedure(s) performed: None  Procedures  Critical Care performed:  No  ____________________________________________   INITIAL IMPRESSION / ASSESSMENT AND PLAN / ED COURSE X-rays findings were discussed with patient.  He was made aware that there is a skin infection in the area of his injury.  Patient is to use warm compresses to the area and began taking Keflex 500 mg 4 times daily for the next 7 days.  He was also given a prescription for Robitussin-AC as needed for his cough and congestion.  He is to increase fluids for his viral URI.  Patient will follow up with his PCP if any continued problems with both his respiratory issues or hand injury. ____________________________________________   FINAL CLINICAL IMPRESSION(S) / ED DIAGNOSES  Final diagnoses:  Abrasion of right hand, initial encounter  Viral URI with cough  Contusion of right hand, initial encounter  Local skin infection     ED Discharge Orders        Ordered    cephALEXin (KEFLEX) 500 MG capsule  4 times daily     02/15/18 1651    guaiFENesin-codeine 100-10 MG/5ML syrup  Every 4 hours PRN     02/15/18 1651       Note:  This document was prepared using Dragon voice recognition software and may include unintentional dictation errors.    Johnn Hai, PA-C 02/15/18 2020    Delman Kitten, MD 02/16/18 [phone]

## 2018-02-18 ENCOUNTER — Encounter: Payer: Self-pay | Admitting: Family Medicine

## 2018-02-18 ENCOUNTER — Ambulatory Visit: Payer: BLUE CROSS/BLUE SHIELD | Admitting: Family Medicine

## 2018-02-18 VITALS — BP 104/70 | HR 65 | Resp 14 | Ht 69.0 in | Wt 330.1 lb

## 2018-02-18 DIAGNOSIS — E785 Hyperlipidemia, unspecified: Secondary | ICD-10-CM | POA: Diagnosis not present

## 2018-02-18 DIAGNOSIS — Z23 Encounter for immunization: Secondary | ICD-10-CM

## 2018-02-18 DIAGNOSIS — E559 Vitamin D deficiency, unspecified: Secondary | ICD-10-CM | POA: Diagnosis not present

## 2018-02-18 DIAGNOSIS — S6992XD Unspecified injury of left wrist, hand and finger(s), subsequent encounter: Secondary | ICD-10-CM

## 2018-02-18 DIAGNOSIS — Z114 Encounter for screening for human immunodeficiency virus [HIV]: Secondary | ICD-10-CM

## 2018-02-18 DIAGNOSIS — G4709 Other insomnia: Secondary | ICD-10-CM

## 2018-02-18 DIAGNOSIS — G8929 Other chronic pain: Secondary | ICD-10-CM

## 2018-02-18 DIAGNOSIS — B353 Tinea pedis: Secondary | ICD-10-CM

## 2018-02-18 DIAGNOSIS — J069 Acute upper respiratory infection, unspecified: Secondary | ICD-10-CM

## 2018-02-18 DIAGNOSIS — E538 Deficiency of other specified B group vitamins: Secondary | ICD-10-CM | POA: Diagnosis not present

## 2018-02-18 DIAGNOSIS — M5440 Lumbago with sciatica, unspecified side: Secondary | ICD-10-CM

## 2018-02-18 DIAGNOSIS — Z9884 Bariatric surgery status: Secondary | ICD-10-CM

## 2018-02-18 DIAGNOSIS — E119 Type 2 diabetes mellitus without complications: Secondary | ICD-10-CM

## 2018-02-18 DIAGNOSIS — I10 Essential (primary) hypertension: Secondary | ICD-10-CM

## 2018-02-18 DIAGNOSIS — E039 Hypothyroidism, unspecified: Secondary | ICD-10-CM

## 2018-02-18 MED ORDER — HYDROCODONE-ACETAMINOPHEN 7.5-325 MG PO TABS: 1.0000 | ORAL_TABLET | Freq: Three times a day (TID) | ORAL | 0 refills | Status: DC | PRN

## 2018-02-18 MED ORDER — SYNTHROID 137 MCG PO TABS: ORAL_TABLET | ORAL | 0 refills | Status: DC

## 2018-02-18 MED ORDER — KETOCONAZOLE 2 % EX CREA: 1.0000 "application " | TOPICAL_CREAM | Freq: Every day | CUTANEOUS | 1 refills | Status: DC

## 2018-02-18 MED ORDER — PREGABALIN 225 MG PO CAPS: ORAL_CAPSULE | ORAL | 2 refills | Status: DC

## 2018-02-18 NOTE — Progress Notes (Signed)
Name: [name]   MRN: [id number]    DOB: 04-01-1971   Date:02/18/2018       Progress Note  Subjective  Chief Complaint  Chief Complaint  Patient presents with  . Diabetes  . Hypertension  . Hyperlipidemia  . Hypothyroidism  . URI    HPI   DMII: diet controlled since bariatric surgery,but hgbA1C is gradually increasing, last visit it was 6.4% today is 6.3% , trying to eat better. He denies polyphagia, polydipsia or polyuria Due for eye exam. We will give him PCV today   Insomnia: he is on Trazodone 100-150 po qhs and it usually helps him sleep and stay asleep. Denies side effects of medication   Muscle Cramps: he states symptoms have improved with B12, D and calcium supplementation, he is not sure if taking Flexeril at this time, no side effects of medication   Hypothyroidism: taking same dose of Synthroid for many months, last TSH at goal. He has chronic dry skin, no hair loss or constipation. Weight is stable   Chronic low back pain : he is taking Norco 7.5/325mg  about three times per day, he used to get 100 pills during the winter months, but is down to 80 and for the warmer months we will go down to 75 pills per month. He denies misuse or selling. Taking Lyricaand it seems to help with pain, when off medication unable to sleep secondary to increase in pain. .No longer under disability , working full time and has new insurance. Pain contract re-singed 01/2018, he has a new job as Photographer.   OA: he still has bilateral knee pain, but left knee is worse , uses Voltaren topically occasionally and also takes hydrodocodone. He states left knee pain is described as aching at times sharp, daily symptoms it also cracks and pops,he went back to Ortho  Neuropathy: he has bilateral daily pain on both legs, aching like, he denies burning or prickly sensation, Lyrica helps.Current pain of3/10   History of bariatric surgery: surgery 09/2012 was he is morbidly obesehis  weight at the time was 396 lbs- lowest weight after surgery was 275 lbs, dow a few pounds since last visit, today's weight 330 lbs.He is now packing his lunch instead of eating out. He is drinking more water now.   HTN:bp is at goal today, he denies dizziness, no chest pain or palpitation.   URI: seen at [facility], states cough was severe over the weekend but feeling much better now, exposed to sick contacts at home and at work, no fever or chills  Hand injury: happened at work 6 days ago, right hand was fine initially, but got very swollen and red and tender over the weekend , he went to [facility], was not given TDap, but was given antibiotics and dressing. Yesterday was swollen again, but better today, able to flex his hand, very mild drainage. Advised to soak with warm water 2 quarts with 1 tbsp of bleach multiple times daily.     Patient Active Problem List   Diagnosis Date Noted  . Sprain of foot 08/29/2017  . Chronic midline low back pain with sciatica 02/11/2017  . Anemia, unspecified 06/12/2016  . B12 deficiency 06/11/2016  . Allergic conjunctivitis 06/11/2016  . Hypocalcemia 06/11/2016  . Muscle cramps 06/11/2016  . Hypertension, benign 11/03/2015  . Dyslipidemia 11/02/2015  . Acquired nystagmus 08/01/2015  . Chronic back pain 06/29/2015  . Carpal tunnel syndrome 06/29/2015  . Osteoarthritis 06/29/2015  . Claustrophobia 06/29/2015  .  Foot drop, left 06/29/2015  . Hiatal hernia 06/29/2015  . H/O diabetes mellitus 06/29/2015  . H/O testicular cancer 06/29/2015  . Acquired hypothyroidism 06/29/2015  . Dysmetabolic syndrome 71/05/2693  . Morbid obesity ([place]) 06/29/2015  . Allergic rhinitis 06/29/2015  . Neuropathy 06/29/2015  . Seborrheic keratoses 06/29/2015  . Vitamin D deficiency 06/29/2015  . Bariatric surgery status 02/20/2012    Past Surgical History:  Procedure Laterality Date  . BACK SURGERY     X 2  . HERNIA REPAIR     left side and umbilical hernia repair  .  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 URETEROTOMY Left 2000    Family History  Problem Relation Age of Onset  . Coronary artery disease Mother   . Cancer Father        Melanoma and Testicular  . Diabetes Father     Social History   Socioeconomic History  . Marital status: Married    Spouse name: Not on file  . Number of children: Not on file  . Years of education: Not on file  . Highest education level: Not on file  Social Needs  . Financial resource strain: Not on file  . Food insecurity - worry: Not on file  . Food insecurity - inability: Not on file  . Transportation needs - medical: Not on file  . Transportation needs - non-medical: Not on file  Occupational History  . Not on file  Tobacco Use  . Smoking status: Former Smoker    Packs/day: 3.00    Years: 8.00    Pack years: 24.00    Types: Cigarettes, Cigars, Pipe    Start date: 06/28/1984    Last attempt to quit: 12/24/1991    Years since quitting: 26.1  . Smokeless tobacco: Former Systems developer    Types: [place] date: 12/24/1991  Substance and Sexual Activity  . Alcohol use: No    Alcohol/week: 0.0 oz  . Drug use: No  . Sexual activity: Yes    Partners: Female  Other Topics Concern  . Not on file  Social History Narrative  . Not on file     Current Outpatient Medications:  .  Calcium Carbonate-Vit D-Min (CALTRATE 600+D PLUS MINERALS) 600-800 MG-UNIT CHEW, Chew by mouth., Disp: , Rfl:  .  cephALEXin (KEFLEX) 500 MG capsule, Take 1 capsule (500 mg total) by mouth 4 (four) times daily., Disp: 30 capsule, Rfl: 0 .  diclofenac sodium (VOLTAREN) 1 % GEL, Apply 4 g topically 4 (four) times daily. Reported on 06/11/2016, Disp: 100 g, Rfl: 1 .  Ferrous Sulfate (IRON) 325 (65 Fe) MG TABS, Take 1 tablet by mouth daily., Disp: , Rfl:  .  fexofenadine (ALLEGRA ALLERGY) 180 MG tablet, Take 1 tablet by mouth daily., Disp: , Rfl:  .  guaiFENesin-codeine 100-10 MG/5ML syrup, Take 5 mLs by mouth every 4 (four) hours as needed.,  Disp: 120 mL, Rfl: 0 .  Homeopathic Products ([employer] LEG CRAMPS PAIN RELIEF PO), Take 2 tablets by mouth 2 (two) times daily at 10 am and 4 pm., Disp: , Rfl:  .  ketoconazole (NIZORAL) 2 % cream, Apply 1 application topically daily., Disp: 60 g, Rfl: 1 .  losartan-hydrochlorothiazide (HYZAAR) 100-12.5 MG tablet, Take 1 tablet by mouth daily., Disp: 90 tablet, Rfl: 1 .  MAGNESIUM PO, Take by mouth., Disp: , Rfl:  .  Melatonin-Pyridoxine (MELATONEX PO), Take by mouth., Disp: , Rfl:  .  Potassium 99 MG TABS, Take by mouth., Disp: ,  Rfl:  .  pregabalin (LYRICA) 225 MG capsule, TAKE 1 CAPSULE 2 TIMES A DAY, Disp: 60 capsule, Rfl: 2 .  SYNTHROID 137 MCG tablet, TAKE 1 TABLET EVERY DAY AND 1 AND ONE-HALF TABLETS ON SUNDAYS, Disp: 90 tablet, Rfl: 0 .  traZODone (DESYREL) 100 MG tablet, Take 1-1.5 tablets (100-150 mg total) by mouth at bedtime., Disp: 135 tablet, Rfl: 1 .  Vitamin D, Ergocalciferol, (DRISDOL) 50000 units CAPS capsule, TAKE ONE CAPSULE EVERY SEVEN DAYS, Disp: 12 capsule, Rfl: 1  Allergies  Allergen Reactions  . Morphine Itching     ROS  Constitutional: Negative for fever or weight change.  Respiratory:Positive for cough but  shortness of breath.   Cardiovascular: Negative for chest pain or palpitations.  Gastrointestinal: Negative for abdominal pain, no bowel changes.  Musculoskeletal: Positive for gait problem and left knee  joint swelling.  Skin: Positive  for rash.  Neurological: Negative for dizziness or headache.  No other specific complaints in a complete review of systems (except as listed in HPI above).  Objective  Vitals:   02/18/18 0742  BP: 104/70  Pulse: 65  Resp: 14  SpO2: 97%  Weight: (!) 330 lb 1.6 oz (149.7 kg)  Height: 5\' 9"  (1.753 m)    Body mass index is 48.75 kg/m.  Physical Exam  Constitutional: Patient appears well-developed and well-nourished. Obese No distress.  HEENT: head atraumatic, normocephalic, pupils equal and reactive to light,  strabismus , neck supple, throat within normal limits Cardiovascular: Normal rate, regular rhythm and normal heart sounds. No murmur heard. No BLE edema. Pulmonary/Chest: Effort normal and breath sounds normal. No respiratory distress. Abdominal: Soft. There is no tenderness. Psychiatric: Patient has a normal mood and affect. behavior is normal. Judgment and thought content normal. Muscular Skeletal: positive straight leg raise, normal rom but pain with extension and flexion ,mild  tenderness during palpation of lumbar spine today Skin: right ulnar dorsal hand has an area of induration, very small amount of clear drainage, it is red.    PHQ2/9: Depression screen [facility] 2/9 08/15/2017 05/14/2017 02/11/2017 09/13/2016 06/11/2016  Decreased Interest 0 0 0 0 0  Down, Depressed, Hopeless 0 0 0 0 0  PHQ - 2 Score 0 0 0 0 0    Fall Risk: Fall Risk  02/18/2018 11/18/2017 08/15/2017 05/14/2017 02/11/2017  Falls in the past year? No No No No No  Number falls in past yr: - - - - -  Injury with Fall? - - - - -    Functional Status Survey: Is the patient deaf or have difficulty hearing?: No Does the patient have difficulty seeing, even when wearing glasses/contacts?: No Does the patient have difficulty concentrating, remembering, or making decisions?: No Does the patient have difficulty walking or climbing stairs?: No Does the patient have difficulty dressing or bathing?: No Does the patient have difficulty doing errands alone such as visiting a doctor's office or shopping?: No    Assessment & Plan  1. Diet-controlled diabetes mellitus ([place])  - POCT HgB A1C - Comprehensive metabolic panel  2. Pneumococcal vaccine administered  - Pneumococcal polysaccharide vaccine 23-valent greater than or equal to 2yo subcutaneous/IM  3. Need for Tdap vaccination  Not given, out of stock   4. Morbid obesity ([place])  Discussed with the patient the risk posed by an increased BMI. Discussed importance of  portion control, calorie counting and at least 150 minutes of physical activity weekly. Avoid sweet beverages and drink more water. Eat at least 6 servings of  fruit and vegetables daily   5. Dyslipidemia  - Lipid panel  6. Bariatric surgery status  Lost 3 lbs since last visit, he keeps his food at work fridge, not going out to eat  7. B12 deficiency  - Vitamin B12  8. Acquired hypothyroidism  - TSH - SYNTHROID 137 MCG tablet; TAKE 1 TABLET EVERY DAY AND 1 AND ONE-HALF TABLETS ON SUNDAYS  Dispense: 90 tablet; Refill: 0  9. Vitamin D deficiency  - VITAMIN D 25 Hydroxy (Vit-D Deficiency, Fractures)  10. Hypertension, benign   11. Chronic nonmalignant pain  - pregabalin (LYRICA) 225 MG capsule; TAKE 1 CAPSULE 2 TIMES A DAY  Dispense: 60 capsule; Refill: 2 - HYDROcodone-acetaminophen (NORCO) 7.5-325 MG tablet; Take 1 tablet by mouth 3 (three) times daily as needed for moderate pain.  Dispense: 75 tablet; Refill: 0 - HYDROcodone-acetaminophen (NORCO) 7.5-325 MG tablet; Take 1 tablet by mouth 3 (three) times daily as needed.  Dispense: 75 tablet; Refill: 0 - HYDROcodone-acetaminophen (NORCO) 7.5-325 MG tablet; Take 1 tablet by mouth 3 (three) times daily as needed for moderate pain.  Dispense: 80 tablet; Refill: 0  12. Other insomnia  Still able to sleep with Trazodone  13. Encounter for screening for HIV  - HIV antibody  14. URI, acute  Went to [facility] for hand and was given cough medication doing better now  15. Hand injury, left, subsequent encounter  Went to [facility] days ago and is taking antibiotics  16. Tinea pedis of both feet  - ketoconazole (NIZORAL) 2 % cream; Apply 1 application topically daily.  Dispense: 60 g; Refill: 1  17. Chronic midline low back pain with sciatica, sciatica laterality unspecified  - pregabalin (LYRICA) 225 MG capsule; TAKE 1 CAPSULE 2 TIMES A DAY  Dispense: 60 capsule; Refill: 2 - HYDROcodone-acetaminophen (NORCO) 7.5-325 MG tablet; Take 1  tablet by mouth 3 (three) times daily as needed for moderate pain.  Dispense: 75 tablet; Refill: 0 - HYDROcodone-acetaminophen (NORCO) 7.5-325 MG tablet; Take 1 tablet by mouth 3 (three) times daily as needed.  Dispense: 75 tablet; Refill: 0 - HYDROcodone-acetaminophen (NORCO) 7.5-325 MG tablet; Take 1 tablet by mouth 3 (three) times daily as needed for moderate pain.  Dispense: 80 tablet; Refill: 0

## 2018-02-19 LAB — VITAMIN D 25 HYDROXY (VIT D DEFICIENCY, FRACTURES): Vit D, 25-Hydroxy: 56 ng/mL (ref 30–100)

## 2018-02-19 LAB — HIV ANTIBODY (ROUTINE TESTING W REFLEX): HIV 1&2 Ab, 4th Generation: NONREACTIVE

## 2018-02-19 LAB — COMPREHENSIVE METABOLIC PANEL
AG Ratio: 1.6 (calc) (ref 1.0–2.5)
ALT: 17 U/L (ref 9–46)
AST: 20 U/L (ref 10–40)
Albumin: 4.2 g/dL (ref 3.6–5.1)
Alkaline phosphatase (APISO): 101 U/L (ref 40–115)
BUN/Creatinine Ratio: 14 (calc) (ref 6–22)
BUN: 19 mg/dL (ref 7–25)
CO2: 31 mmol/L (ref 20–32)
Calcium: 9.1 mg/dL (ref 8.6–10.3)
Chloride: 103 mmol/L (ref 98–110)
Creat: 1.38 mg/dL — ABNORMAL HIGH (ref 0.60–1.35)
Globulin: 2.7 g/dL (calc) (ref 1.9–3.7)
Glucose, Bld: 140 mg/dL — ABNORMAL HIGH (ref 65–99)
Potassium: 4.6 mmol/L (ref 3.5–5.3)
Sodium: 140 mmol/L (ref 135–146)
Total Bilirubin: 0.7 mg/dL (ref 0.2–1.2)
Total Protein: 6.9 g/dL (ref 6.1–8.1)

## 2018-02-19 LAB — LIPID PANEL
Cholesterol: 122 mg/dL (ref <200)
HDL: 30 mg/dL — ABNORMAL LOW (ref >40)
LDL Cholesterol (Calc): 76 mg/dL (calc)
Non-HDL Cholesterol (Calc): 92 mg/dL (calc) (ref <130)
Total CHOL/HDL Ratio: 4.1 (calc) (ref <5.0)
Triglycerides: 84 mg/dL (ref <150)

## 2018-02-19 LAB — VITAMIN B12: Vitamin B-12: 719 pg/mL (ref 200–1100)

## 2018-02-19 LAB — TSH: TSH: 3.11 mIU/L (ref 0.40–4.50)

## 2018-02-24 ENCOUNTER — Telehealth: Payer: Self-pay

## 2018-02-24 NOTE — Telephone Encounter (Signed)
-----   Message from [name], MD sent at 02/22/2018 12:55 PM EST ----- Glucose is okay for a patient with DM, kidney function showed elevation of Creatinine level, can you please add GFR Normal liver function test HIV negative Lipid panel still showed low HDL ( good cholesterol) but LDL ( bad cholesterol is at goal) Normal B12 level  Normal vitamin D level TSH is at goal, continue current dose of levothyroxine

## 2018-02-24 NOTE — Telephone Encounter (Signed)
Called home phone and a man stated I had the wrong number. Then called his wife phone yesterday and today no voicemail is set up.

## 2018-02-25 NOTE — Telephone Encounter (Signed)
Number listed for wife is disconnected. Tried again to reach on 'Home' number listed; wrong number.

## 2018-03-17 ENCOUNTER — Other Ambulatory Visit: Payer: Self-pay | Admitting: Family Medicine

## 2018-03-17 DIAGNOSIS — G8929 Other chronic pain: Secondary | ICD-10-CM

## 2018-03-17 DIAGNOSIS — M5440 Lumbago with sciatica, unspecified side: Principal | ICD-10-CM

## 2018-03-17 NOTE — Telephone Encounter (Signed)
Refill request for general medication: Lyrica 225 mg  Last office visit: 02/18/2018  Last physical exam: None indicated  Follow up visit: 05/21/2018

## 2018-05-21 ENCOUNTER — Ambulatory Visit (INDEPENDENT_AMBULATORY_CARE_PROVIDER_SITE_OTHER): Payer: 59 | Admitting: Family Medicine

## 2018-05-21 ENCOUNTER — Encounter: Payer: Self-pay | Admitting: Family Medicine

## 2018-05-21 VITALS — BP 110/70 | HR 57 | Resp 16 | Ht 69.0 in | Wt 321.7 lb

## 2018-05-21 DIAGNOSIS — M5440 Lumbago with sciatica, unspecified side: Secondary | ICD-10-CM

## 2018-05-21 DIAGNOSIS — E538 Deficiency of other specified B group vitamins: Secondary | ICD-10-CM

## 2018-05-21 DIAGNOSIS — E785 Hyperlipidemia, unspecified: Secondary | ICD-10-CM

## 2018-05-21 DIAGNOSIS — E669 Obesity, unspecified: Secondary | ICD-10-CM

## 2018-05-21 DIAGNOSIS — I10 Essential (primary) hypertension: Secondary | ICD-10-CM

## 2018-05-21 DIAGNOSIS — E559 Vitamin D deficiency, unspecified: Secondary | ICD-10-CM

## 2018-05-21 DIAGNOSIS — E039 Hypothyroidism, unspecified: Secondary | ICD-10-CM | POA: Diagnosis not present

## 2018-05-21 DIAGNOSIS — Z9884 Bariatric surgery status: Secondary | ICD-10-CM | POA: Diagnosis not present

## 2018-05-21 DIAGNOSIS — E1169 Type 2 diabetes mellitus with other specified complication: Secondary | ICD-10-CM | POA: Diagnosis not present

## 2018-05-21 DIAGNOSIS — S9782XA Crushing injury of left foot, initial encounter: Secondary | ICD-10-CM | POA: Diagnosis not present

## 2018-05-21 DIAGNOSIS — G8929 Other chronic pain: Secondary | ICD-10-CM | POA: Diagnosis not present

## 2018-05-21 LAB — POCT GLYCOSYLATED HEMOGLOBIN (HGB A1C): Hemoglobin A1C: 6.4 % — AB (ref 4.0–5.6)

## 2018-05-21 LAB — POCT UA - MICROALBUMIN: Microalbumin Ur, POC: 20 mg/L

## 2018-05-21 MED ORDER — LOSARTAN POTASSIUM-HCTZ 100-12.5 MG PO TABS: 1.0000 | ORAL_TABLET | Freq: Every day | ORAL | 1 refills | Status: DC

## 2018-05-21 MED ORDER — HYDROCODONE-ACETAMINOPHEN 7.5-325 MG PO TABS: 1.0000 | ORAL_TABLET | Freq: Three times a day (TID) | ORAL | 0 refills | Status: DC | PRN

## 2018-05-21 MED ORDER — SYNTHROID 137 MCG PO TABS: ORAL_TABLET | ORAL | 1 refills | Status: DC

## 2018-05-21 MED ORDER — PREGABALIN 225 MG PO CAPS: ORAL_CAPSULE | ORAL | 1 refills | Status: DC

## 2018-05-21 NOTE — Progress Notes (Signed)
Name: [name]   MRN: [id number]    DOB: Mar 16, 1971   Date:05/21/2018       Progress Note  Subjective  Chief Complaint  Chief Complaint  Patient presents with  . Diabetes  . Hypertension  . Hypothyroidism  . Back Pain  . Foot Injury    He dropped a piece of granite on his left foot and injuried it.    HPI  DMII: diet controlled since bariatric surgery,but hgbA1C is gradually increasing but has been stable over the past 9 months, it was  6.4%, 6.3% and now 6.4% again , trying to eat better, usually looses weight this time of the year. He denies polyphagia, polydipsia or polyuriaDue for eye exam and reminded him of the importance of getting it done.   Insomnia: he is off  Trazodone100-150 po qhs , he states he has been sleeping better.   Muscle Cramps: he states symptoms have improved with B12, D and calcium supplementation, he brought his list and is back on Flexeril again.   Hypothyroidism: taking same dose of Synthroid for many months, last TSH at goal. He has chronic dry skin, no hair loss or constipation. Lost 9 lbs since last visit, but states it usually happens to him this time of the year.   Chronic low back pain : he is taking Norco 7.5/325mg  about three times per day, he used to get 100 pills during the winter months, but is down to 80  In the winter, and Summer down to  75 pills per month. He denies misuse or selling. Taking Lyricaand it seems to help with pain, when off medication unable to sleep secondary to increase in pain. .No longer under disability , working full time and has new insurance. Pain contract re-singed 01/2018, he has a new job as Photographer. He states that the tractor does most of the digging. He needs to give [place] an urine specimen today   OA: he still has bilateral knee pain, but left knee is worse , uses Voltaren topicallyoccasionallyand also takes hydrodocodone. He states left knee pain is described as aching at times sharp, daily  symptoms it also cracks and pops, he went back to Ortho and was given reassurance only. He is now also having right elbow pain- likely from shoveling   Neuropathy: he has bilateral daily pain on both legs, secondary to chronic back problems, aching like, he denies burning or prickly sensation, Lyrica helps.Current pain of4/10   History of bariatric surgery: surgery 09/2012 was he is morbidly obesehis weight at the time was 396 lbs- lowest weight after surgery was 275 lbs, he lost more weight since last visit and is down to 321 lbs. He is now packing his lunch instead of eating out. He is drinking more water now.   HTN:bp is at goal today, he denies dizziness, no chest pain or palpitation.Unchanged   Left foot injury: advised him to discuss it with employer, he does need to see Podiatrist and should use workman's comp. If not approved I will place referral. Injury happened at work, a granit slab slid on top of his left foot, it got swollen tender and bruised right away, he has been wearing an old ortho shoe and states swelling is going down but has a blister and is still having pain   Patient Active Problem List   Diagnosis Date Noted  . Chronic midline low back pain with sciatica 02/11/2017  . Anemia, unspecified 06/12/2016  . B12 deficiency 06/11/2016  .  Allergic conjunctivitis 06/11/2016  . Hypocalcemia 06/11/2016  . Muscle cramps 06/11/2016  . Hypertension, benign 11/03/2015  . Dyslipidemia 11/02/2015  . Acquired nystagmus 08/01/2015  . Chronic back pain 06/29/2015  . Carpal tunnel syndrome 06/29/2015  . Osteoarthritis 06/29/2015  . Claustrophobia 06/29/2015  . Foot drop, left 06/29/2015  . Hiatal hernia 06/29/2015  . H/O diabetes mellitus 06/29/2015  . H/O testicular cancer 06/29/2015  . Acquired hypothyroidism 06/29/2015  . Dysmetabolic syndrome 68/34/1962  . Morbid obesity ([place]) 06/29/2015  . Allergic rhinitis 06/29/2015  . Neuropathy 06/29/2015  . Seborrheic  keratoses 06/29/2015  . Vitamin D deficiency 06/29/2015  . Bariatric surgery status 02/20/2012    Past Surgical History:  Procedure Laterality Date  . BACK SURGERY     X 2  . HERNIA REPAIR     left side and umbilical hernia repair  . 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 URETEROTOMY Left 2000    Family History  Problem Relation Age of Onset  . Coronary artery disease Mother   . Cancer Father        Melanoma and Testicular  . Diabetes Father     Social History   Socioeconomic History  . Marital status: Married    Spouse name: Not on file  . Number of children: Not on file  . Years of education: Not on file  . Highest education level: Not on file  Occupational History  . Not on file  Social Needs  . Financial resource strain: Not on file  . Food insecurity:    Worry: Not on file    Inability: Not on file  . Transportation needs:    Medical: Not on file    Non-medical: Not on file  Tobacco Use  . Smoking status: Former Smoker    Packs/day: 3.00    Years: 8.00    Pack years: 24.00    Types: Cigarettes, Cigars, Pipe    Start date: 06/28/1984    Last attempt to quit: 12/24/1991    Years since quitting: 26.4  . Smokeless tobacco: Former Systems developer    Types: [place] date: 12/24/1991  Substance and Sexual Activity  . Alcohol use: No    Alcohol/week: 0.0 oz  . Drug use: No  . Sexual activity: Yes    Partners: Female  Lifestyle  . Physical activity:    Days per week: Not on file    Minutes per session: Not on file  . Stress: Not on file  Relationships  . Social connections:    Talks on phone: Not on file    Gets together: Not on file    Attends religious service: Not on file    Active member of club or organization: Not on file    Attends meetings of clubs or organizations: Not on file    Relationship status: Not on file  . Intimate partner violence:    Fear of current or ex partner: Not on file    Emotionally abused: Not on file    Physically abused: Not on  file    Forced sexual activity: Not on file  Other Topics Concern  . Not on file  Social History Narrative  . Not on file     Current Outpatient Medications:  .  Calcium Carbonate-Vit D-Min (CALTRATE 600+D PLUS MINERALS) 600-800 MG-UNIT CHEW, Chew by mouth., Disp: , Rfl:  .  diclofenac sodium (VOLTAREN) 1 % GEL, Apply 4 g topically 4 (four) times daily. Reported on 06/11/2016, Disp: 100  g, Rfl: 1 .  fexofenadine (ALLEGRA ALLERGY) 180 MG tablet, Take 1 tablet by mouth daily., Disp: , Rfl:  .  Homeopathic Products ([employer] LEG CRAMPS PAIN RELIEF PO), Take 2 tablets by mouth 2 (two) times daily at 10 am and 4 pm., Disp: , Rfl:  .  HYDROcodone-acetaminophen (NORCO) 7.5-325 MG tablet, Take 1 tablet by mouth 3 (three) times daily as needed for moderate pain., Disp: 75 tablet, Rfl: 0 .  HYDROcodone-acetaminophen (NORCO) 7.5-325 MG tablet, Take 1 tablet by mouth 3 (three) times daily as needed., Disp: 75 tablet, Rfl: 0 .  HYDROcodone-acetaminophen (NORCO) 7.5-325 MG tablet, Take 1 tablet by mouth 3 (three) times daily as needed for moderate pain., Disp: 80 tablet, Rfl: 0 .  ketoconazole (NIZORAL) 2 % cream, Apply 1 application topically daily., Disp: 60 g, Rfl: 1 .  losartan-hydrochlorothiazide (HYZAAR) 100-12.5 MG tablet, Take 1 tablet by mouth daily., Disp: 90 tablet, Rfl: 1 .  MAGNESIUM PO, Take by mouth., Disp: , Rfl:  .  Melatonin-Pyridoxine (MELATONEX PO), Take by mouth., Disp: , Rfl:  .  Potassium 99 MG TABS, Take by mouth., Disp: , Rfl:  .  pregabalin (LYRICA) 225 MG capsule, TAKE 1 CAPSULE TWO TIMES A DAY., Disp: 60 capsule, Rfl: 1 .  SYNTHROID 137 MCG tablet, TAKE 1 TABLET EVERY DAY AND 1 AND ONE-HALF TABLETS ON SUNDAYS, Disp: 90 tablet, Rfl: 0 .  Vitamin D, Ergocalciferol, (DRISDOL) 50000 units CAPS capsule, TAKE ONE CAPSULE EVERY SEVEN DAYS, Disp: 12 capsule, Rfl: 1 .  cyclobenzaprine (FLEXERIL) 10 MG tablet, Take 1 tablet by mouth at bedtime., Disp: , Rfl:   Allergies  Allergen Reactions   . Morphine Itching     ROS  Constitutional: Negative for fever, positive for  weight change.  Respiratory: Negative for cough and shortness of breath.   Cardiovascular: Negative for chest pain or palpitations.  Gastrointestinal: Negative for abdominal pain, no bowel changes.  Musculoskeletal: Positive  for gait problem and foot swelling.  Skin: Negative for rash.  Neurological: Negative for dizziness or headache.  No other specific complaints in a complete review of systems (except as listed in HPI above).  Objective  Vitals:   05/21/18 0754  BP: 110/70  Pulse: (!) 57  Resp: 16  SpO2: 97%  Weight: (!) 321 lb 11.2 oz (145.9 kg)  Height: 5\' 9"  (1.753 m)    Body mass index is 47.51 kg/m.  Physical Exam  Constitutional: Patient appears well-developed and well-nourished. Obese No distress.  HEENT: head atraumatic, normocephalic, he has strabismus , pupils equal and reactive to light,  neck supple, throat within normal limits Cardiovascular: Normal rate, regular rhythm and normal heart sounds.  No murmur heard. Positive for left foot swelling from recent injury  Pulmonary/Chest: Effort normal and breath sounds normal. No respiratory distress. Abdominal: Soft.  There is no tenderness. Psychiatric: Patient has a normal mood and affect. behavior is normal. Judgment and thought content normal.  Recent Results (from the past 2160 hour(s))  POCT HgB A1C     Status: Abnormal   Collection Time: 05/21/18  7:58 AM  Result Value Ref Range   Hemoglobin A1C 6.4 (A) 4.0 - 5.6 %   HbA1c, POC (prediabetic range)  5.7 - 6.4 %   HbA1c, POC (controlled diabetic range)  0.0 - 7.0 %    Diabetic Foot Exam: Diabetic Foot Exam - Simple   Simple Foot Form Visual Inspection See comments:  Yes Sensation Testing See comments:  Yes Pulse Check Posterior Tibialis and  Dorsalis pulse intact bilaterally:  Yes Comments He has a recent injury and left foot is still swollen, bruised and has a  blister, decrease in sensation from back surgery on left foot only       PHQ2/9: Depression screen [facility] 2/9 05/21/2018 08/15/2017 05/14/2017 02/11/2017 09/13/2016  Decreased Interest 0 0 0 0 0  Down, Depressed, Hopeless 0 0 0 0 0  PHQ - 2 Score 0 0 0 0 0  Altered sleeping 0 - - - -  Tired, decreased energy 1 - - - -  Change in appetite 0 - - - -  Feeling bad or failure about yourself  0 - - - -  Trouble concentrating 0 - - - -  Moving slowly or fidgety/restless 0 - - - -  Suicidal thoughts 0 - - - -  PHQ-9 Score 1 - - - -  Difficult doing work/chores Not difficult at all - - - -    Fall Risk: Fall Risk  05/21/2018 02/18/2018 11/18/2017 08/15/2017 05/14/2017  Falls in the past year? No No No No No  Number falls in past yr: - - - - -  Injury with Fall? - - - - -     Functional Status Survey: Is the patient deaf or have difficulty hearing?: No Does the patient have difficulty seeing, even when wearing glasses/contacts?: No Does the patient have difficulty concentrating, remembering, or making decisions?: No Does the patient have difficulty walking or climbing stairs?: No Does the patient have difficulty dressing or bathing?: No Does the patient have difficulty doing errands alone such as visiting a doctor's office or shopping?: No    Assessment & Plan  1. Diabetes mellitus type 2 in obese (HCC)  - POCT HgB A1C - POCT UA - Microalbumin  2. Bariatric surgery status  Lost weight since last visit, continue hard work   3. Morbid obesity ([place])  Discussed with the patient the risk posed by an increased BMI. Discussed importance of portion control, calorie counting and at least 150 minutes of physical activity weekly. Avoid sweet beverages and drink more water. Eat at least 6 servings of fruit and vegetables daily   4. Vitamin D deficiency  Continue supplementation   5. Acquired hypothyroidism  Last TSH at goal  - SYNTHROID 137 MCG tablet; TAKE 1 TABLET EVERY DAY AND 1 AND  ONE-HALF TABLETS ON SUNDAYS  Dispense: 90 tablet; Refill: 1  6. Dyslipidemia  Needs to consider statin therapy   7. Hypertension, benign  At goal   8. Chronic midline low back pain with sciatica, sciatica laterality unspecified  - HYDROcodone-acetaminophen (NORCO) 7.5-325 MG tablet; Take 1 tablet by mouth 3 (three) times daily as needed for moderate pain.  Dispense: 75 tablet; Refill: 0 - HYDROcodone-acetaminophen (NORCO) 7.5-325 MG tablet; Take 1 tablet by mouth 3 (three) times daily as needed.  Dispense: 75 tablet; Refill: 0 - HYDROcodone-acetaminophen (NORCO) 7.5-325 MG tablet; Take 1 tablet by mouth 3 (three) times daily as needed for moderate pain.  Dispense: 75 tablet; Refill: 0 - pregabalin (LYRICA) 225 MG capsule; TAKE 1 CAPSULE TWO TIMES A DAY.  Dispense: 180 capsule; Refill: 1  9. B12 deficiency   10. Chronic nonmalignant pain  - HYDROcodone-acetaminophen (NORCO) 7.5-325 MG tablet; Take 1 tablet by mouth 3 (three) times daily as needed for moderate pain.  Dispense: 75 tablet; Refill: 0 - HYDROcodone-acetaminophen (NORCO) 7.5-325 MG tablet; Take 1 tablet by mouth 3 (three) times daily as needed.  Dispense: 75 tablet; Refill: 0 -  HYDROcodone-acetaminophen (NORCO) 7.5-325 MG tablet; Take 1 tablet by mouth 3 (three) times daily as needed for moderate pain.  Dispense: 75 tablet; Refill: 0 - pregabalin (LYRICA) 225 MG capsule; TAKE 1 CAPSULE TWO TIMES A DAY.  Dispense: 180 capsule; Refill: 1  11. Crushing injury of left foot, initial encounter  He is going to try worman's comp and if not able he will call back for referral to podiatrist

## 2018-05-25 ENCOUNTER — Encounter: Payer: Self-pay | Admitting: Family Medicine

## 2018-06-19 ENCOUNTER — Telehealth: Payer: Self-pay | Admitting: Family Medicine

## 2018-06-19 NOTE — Telephone Encounter (Signed)
Called [facility] and ok medication to be send early per Dr. Ancil Boozer.

## 2018-06-19 NOTE — Telephone Encounter (Signed)
Copied from [place] [id number]. Topic: Quick Communication - Rx Refill/Question >> Jun 19, 2018  3:08 PM Oliver Pila B wrote: Medication: HYDROcodone-acetaminophen ([place]) 7.5-325 MG tablet [[id number]]   Pharmacy called and states the medication was e-scripted but its not able to be filled until 6.30.19, the office will be closed and the pt is going out of town tomorrow, Pensions consultant to advise

## 2018-06-19 NOTE — Telephone Encounter (Signed)
He can fill early this month, but will have to fill 30 days after next fill

## 2018-08-17 ENCOUNTER — Other Ambulatory Visit: Payer: Self-pay | Admitting: Family Medicine

## 2018-08-17 DIAGNOSIS — E559 Vitamin D deficiency, unspecified: Secondary | ICD-10-CM

## 2018-08-17 DIAGNOSIS — G8929 Other chronic pain: Secondary | ICD-10-CM

## 2018-08-17 DIAGNOSIS — M5440 Lumbago with sciatica, unspecified side: Principal | ICD-10-CM

## 2018-08-17 DIAGNOSIS — G4709 Other insomnia: Secondary | ICD-10-CM

## 2018-08-17 NOTE — Telephone Encounter (Signed)
Refill request for general medication. Vitamin D, Lyrica, and Trazodone  Last office visit: 05/21/2018   Follow up on 08/21/2018

## 2018-08-21 ENCOUNTER — Ambulatory Visit: Payer: 59 | Admitting: Family Medicine

## 2018-08-21 ENCOUNTER — Encounter: Payer: Self-pay | Admitting: Family Medicine

## 2018-08-21 VITALS — BP 110/66 | HR 64 | Temp 97.7°F | Resp 16 | Ht 69.0 in | Wt 319.8 lb

## 2018-08-21 DIAGNOSIS — M5440 Lumbago with sciatica, unspecified side: Secondary | ICD-10-CM | POA: Diagnosis not present

## 2018-08-21 DIAGNOSIS — E559 Vitamin D deficiency, unspecified: Secondary | ICD-10-CM

## 2018-08-21 DIAGNOSIS — E1169 Type 2 diabetes mellitus with other specified complication: Secondary | ICD-10-CM | POA: Diagnosis not present

## 2018-08-21 DIAGNOSIS — E669 Obesity, unspecified: Secondary | ICD-10-CM

## 2018-08-21 DIAGNOSIS — E039 Hypothyroidism, unspecified: Secondary | ICD-10-CM

## 2018-08-21 DIAGNOSIS — Z23 Encounter for immunization: Secondary | ICD-10-CM | POA: Diagnosis not present

## 2018-08-21 DIAGNOSIS — J302 Other seasonal allergic rhinitis: Secondary | ICD-10-CM

## 2018-08-21 DIAGNOSIS — G8929 Other chronic pain: Secondary | ICD-10-CM | POA: Diagnosis not present

## 2018-08-21 DIAGNOSIS — G4709 Other insomnia: Secondary | ICD-10-CM

## 2018-08-21 DIAGNOSIS — Z8739 Personal history of other diseases of the musculoskeletal system and connective tissue: Secondary | ICD-10-CM

## 2018-08-21 DIAGNOSIS — Z9884 Bariatric surgery status: Secondary | ICD-10-CM

## 2018-08-21 DIAGNOSIS — E785 Hyperlipidemia, unspecified: Secondary | ICD-10-CM

## 2018-08-21 DIAGNOSIS — I10 Essential (primary) hypertension: Secondary | ICD-10-CM

## 2018-08-21 DIAGNOSIS — D649 Anemia, unspecified: Secondary | ICD-10-CM

## 2018-08-21 DIAGNOSIS — J3089 Other allergic rhinitis: Secondary | ICD-10-CM

## 2018-08-21 MED ORDER — CALTRATE 600+D PLUS MINERALS 600-800 MG-UNIT PO CHEW: 1.0000 | CHEWABLE_TABLET | Freq: Every day | ORAL | 1 refills | Status: DC

## 2018-08-21 MED ORDER — HYDROCODONE-ACETAMINOPHEN 7.5-325 MG PO TABS: 1.0000 | ORAL_TABLET | Freq: Three times a day (TID) | ORAL | 0 refills | Status: DC | PRN

## 2018-08-21 MED ORDER — FEXOFENADINE HCL 180 MG PO TABS: 180.0000 mg | ORAL_TABLET | Freq: Every day | ORAL | 3 refills | Status: DC

## 2018-08-21 MED ORDER — TRAZODONE HCL 100 MG PO TABS: ORAL_TABLET | ORAL | 0 refills | Status: DC

## 2018-08-21 MED ORDER — CENTRUM PO CHEW: 1.0000 | CHEWABLE_TABLET | Freq: Every day | ORAL | 3 refills | Status: DC

## 2018-08-21 MED ORDER — LOSARTAN POTASSIUM-HCTZ 100-12.5 MG PO TABS: 1.0000 | ORAL_TABLET | Freq: Every day | ORAL | 1 refills | Status: DC

## 2018-08-21 MED ORDER — FERROUS SULFATE 325 (65 FE) MG PO TABS: 325.0000 mg | ORAL_TABLET | Freq: Every day | ORAL | 3 refills | Status: DC

## 2018-08-21 MED ORDER — VITAMIN D (ERGOCALCIFEROL) 1.25 MG (50000 UNIT) PO CAPS: ORAL_CAPSULE | ORAL | 1 refills | Status: DC

## 2018-08-21 MED ORDER — VITAMIN B-12 500 MCG SL SUBL: 1.0000 | SUBLINGUAL_TABLET | Freq: Every day | SUBLINGUAL | 3 refills | Status: DC

## 2018-08-21 MED ORDER — ATORVASTATIN CALCIUM 10 MG PO TABS: 10.0000 mg | ORAL_TABLET | Freq: Every day | ORAL | 1 refills | Status: DC

## 2018-08-21 MED ORDER — MAGNESIUM 500 MG PO CAPS: 1.0000 | ORAL_CAPSULE | Freq: Every day | ORAL | 3 refills | Status: DC

## 2018-08-21 MED ORDER — LOSARTAN POTASSIUM-HCTZ 50-12.5 MG PO TABS: 1.0000 | ORAL_TABLET | Freq: Every day | ORAL | 0 refills | Status: DC

## 2018-08-21 NOTE — Progress Notes (Signed)
Name: [name]   MRN: [id number]    DOB: [birth year]   Date:08/21/2018       Progress Note  Subjective  Chief Complaint  Chief Complaint  Patient presents with  . Follow-up  . Medication Refill  . Hypertension  . Diabetes    HPI  DMII: diet controlled since bariatric surgery,but hgbA1C is gradually increasing but has been stable over the past year. it was 6.4%, 6.3% and now 6.4% again  we will check at [facility] today. He has been , trying to eat better, he tends to eat healthier during the Summer. Marland KitchenHe denies polyphagia, polydipsia or polyuriaDue for eye exam and reminded him of the importance of getting it done. Urine micro is up to date He does not check glucose at home  Insomnia: he was not taking Trazodone last visit, but he is not sure now, his wife dispenses his medication. He states he has been sleeping well most nights.   Muscle Cramps: he states symptoms have improved with B12, D and calcium supplementation, he is not sure if he is taking Flexeril but will call back if he needs a refill. .   Hypothyroidism: taking same dose of Synthroid for many months, last TSH at goal. He has chronic dry skin, no hair lossorconstipation.weight has been stable since last visit, but we will recheck labs today   Chronic low back pain : he is taking Norco 7.5/325mg  about three times per day,he used to get 100 pills during the winter months, but is down to 75 during Summer, medication has been lasting until follow up visits. Reviewed [place] controlled substance database and only other prescriber in the  past year for a cough medicationHe denies misuse or selling. Taking Lyricaand it seems to help with pain, when off medication unable to sleep secondary to increase in pain..No longer under disability , working full time and has new insurance. Pain contract re-singed02/2019, drug screen normal 04/2018  Neuropathy: he has bilateral daily pain on both legs, secondary to chronic back  problems, aching like, he denies burning or prickly sensation, Lyrica helps.Current pain of4/10 . Stable.   History of bariatric surgery: surgery 09/2012 was he is morbidly obesehis weight at the time was 396 lbs- lowest weight after surgery was 275 lbs, he lost more weight since last visit and is down to 319  lbs. He is now packing his lunch instead of eating out. He is drinking more water now. His job is very active   HTN:bp is towards low end of normal, works in the sun and shoveling and gets dizzy at times, we will decrease dose of losartan hctz from 100/12.5 to 50/12.5 mg daily and monitor   Right foot gout: seen at Urgent Care, he states pain has gone down, we will check uric acid  Left foot fracture: seen by Ortho, did not have surgery and wore a boot, still having swelling and pain and getting PT now   Patient Active Problem List   Diagnosis Date Noted  . Chronic midline low back pain with sciatica 02/11/2017  . Anemia, unspecified 06/12/2016  . B12 deficiency 06/11/2016  . Allergic conjunctivitis 06/11/2016  . Hypocalcemia 06/11/2016  . Muscle cramps 06/11/2016  . Hypertension, benign 11/03/2015  . Dyslipidemia 11/02/2015  . Acquired nystagmus 08/01/2015  . Chronic back pain 06/29/2015  . Carpal tunnel syndrome 06/29/2015  . Osteoarthritis 06/29/2015  . Claustrophobia 06/29/2015  . Foot drop, left 06/29/2015  . Hiatal hernia 06/29/2015  . H/O diabetes mellitus 06/29/2015  .  H/O testicular cancer 06/29/2015  . Acquired hypothyroidism 06/29/2015  . Dysmetabolic syndrome 88/41/6606  . Morbid obesity ([place]) 06/29/2015  . Allergic rhinitis 06/29/2015  . Neuropathy 06/29/2015  . Seborrheic keratoses 06/29/2015  . Vitamin D deficiency 06/29/2015  . Bariatric surgery status 02/20/2012    Past Surgical History:  Procedure Laterality Date  . BACK SURGERY     X 2  . HERNIA REPAIR     left side and umbilical hernia repair  . 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  URETEROTOMY Left 2000    Family History  Problem Relation Age of Onset  . Coronary artery disease Mother   . Cancer Father        Melanoma and Testicular  . Diabetes Father     Social History   Socioeconomic History  . Marital status: Married    Spouse name: Not on file  . Number of children: Not on file  . Years of education: Not on file  . Highest education level: Not on file  Occupational History  . Not on file  Social Needs  . Financial resource strain: Not on file  . Food insecurity:    Worry: Not on file    Inability: Not on file  . Transportation needs:    Medical: Not on file    Non-medical: Not on file  Tobacco Use  . Smoking status: Former Smoker    Packs/day: 3.00    Years: 8.00    Pack years: 24.00    Types: Cigarettes, Cigars, Pipe    Start date: 06/28/1984    Last attempt to quit: 12/24/1991    Years since quitting: 26.6  . Smokeless tobacco: Former Systems developer    Types: [place] date: 12/24/1991  Substance and Sexual Activity  . Alcohol use: No    Alcohol/week: 0.0 standard drinks  . Drug use: No  . Sexual activity: Yes    Partners: Female  Lifestyle  . Physical activity:    Days per week: Not on file    Minutes per session: Not on file  . Stress: Not on file  Relationships  . Social connections:    Talks on phone: Not on file    Gets together: Not on file    Attends religious service: Not on file    Active member of club or organization: Not on file    Attends meetings of clubs or organizations: Not on file    Relationship status: Not on file  . Intimate partner violence:    Fear of current or ex partner: Not on file    Emotionally abused: Not on file    Physically abused: Not on file    Forced sexual activity: Not on file  Other Topics Concern  . Not on file  Social History Narrative  . Not on file     Current Outpatient Medications:  .  Calcium Carbonate-Vit D-Min (CALTRATE 600+D PLUS MINERALS) 600-800 MG-UNIT CHEW, Chew 1 tablet by  mouth daily., Disp: 180 tablet, Rfl: 1 .  cyclobenzaprine (FLEXERIL) 10 MG tablet, Take 1 tablet by mouth at bedtime., Disp: , Rfl:  .  diclofenac sodium (VOLTAREN) 1 % GEL, Apply 4 g topically 4 (four) times daily. Reported on 06/11/2016, Disp: 100 g, Rfl: 1 .  fexofenadine (ALLEGRA ALLERGY) 180 MG tablet, Take 1 tablet (180 mg total) by mouth daily., Disp: 90 tablet, Rfl: 3 .  Homeopathic Products ([employer] LEG CRAMPS PAIN RELIEF PO), Take 2 tablets by mouth 2 (two)  times daily at 10 am and 4 pm., Disp: , Rfl:  .  HYDROcodone-acetaminophen (NORCO) 7.5-325 MG tablet, Take 1 tablet by mouth 3 (three) times daily as needed for moderate pain., Disp: 75 tablet, Rfl: 0 .  ketoconazole (NIZORAL) 2 % cream, Apply 1 application topically daily., Disp: 60 g, Rfl: 1 .  Melatonin-Pyridoxine (MELATONEX PO), Take by mouth., Disp: , Rfl:  .  Potassium 99 MG TABS, Take by mouth., Disp: , Rfl:  .  pregabalin (LYRICA) 225 MG capsule, TAKE 1 CAPSULE TWO TIMES A DAY., Disp: 180 capsule, Rfl: 1 .  SYNTHROID 137 MCG tablet, TAKE 1 TABLET EVERY DAY AND 1 AND ONE-HALF TABLETS ON SUNDAYS, Disp: 90 tablet, Rfl: 1 .  traZODone (DESYREL) 100 MG tablet, TAKE ONE TO ONE AND ONE-HALF TABLET BY MOUTH AT BEDTIME., Disp: 135 tablet, Rfl: 0 .  atorvastatin (LIPITOR) 10 MG tablet, Take 1 tablet (10 mg total) by mouth daily., Disp: 90 tablet, Rfl: 1 .  Cyanocobalamin (VITAMIN B-12) 500 MCG SUBL, Place 1 tablet (500 mcg total) under the tongue daily., Disp: 90 tablet, Rfl: 3 .  ferrous sulfate 325 (65 FE) MG tablet, Take 1 tablet (325 mg total) by mouth daily with breakfast., Disp: 90 tablet, Rfl: 3 .  HYDROcodone-acetaminophen (NORCO) 7.5-325 MG tablet, Take 1 tablet by mouth 3 (three) times daily as needed., Disp: 75 tablet, Rfl: 0 .  HYDROcodone-acetaminophen (NORCO) 7.5-325 MG tablet, Take 1 tablet by mouth 3 (three) times daily as needed for moderate pain., Disp: 75 tablet, Rfl: 0 .  losartan-hydrochlorothiazide (HYZAAR) 50-12.5 MG  tablet, Take 1 tablet by mouth daily., Disp: 90 tablet, Rfl: 0 .  Magnesium 500 MG CAPS, Take 1 capsule (500 mg total) by mouth daily., Disp: 90 capsule, Rfl: 3 .  multivitamin-iron-minerals-folic acid (CENTRUM) chewable tablet, Chew 1 tablet by mouth daily., Disp: 90 tablet, Rfl: 3 .  Vitamin D, Ergocalciferol, (DRISDOL) 50000 units CAPS capsule, Take weekly, Disp: 12 capsule, Rfl: 1  Allergies  Allergen Reactions  . Morphine Itching     ROS  Constitutional: Negative for fever or weight change.  Respiratory: Negative for cough and shortness of breath.   Cardiovascular: Negative for chest pain or palpitations.  Gastrointestinal: Negative for abdominal pain, no bowel changes.  Musculoskeletal: Negative for gait problem or joint swelling.  Skin: Negative for rash.  Neurological: Negative for dizziness or headache.  No other specific complaints in a complete review of systems (except as listed in HPI above).  Objective  Vitals:   08/21/18 0830  BP: 110/66  Pulse: 64  Resp: 16  Temp: 97.7 F (36.5 C)  TempSrc: Oral  SpO2: 99%  Weight: (!) 319 lb 12.8 oz (145.1 kg)  Height: 5\' 9"  (1.753 m)    Body mass index is 47.23 kg/m.  Physical Exam  Constitutional: Patient appears well-developed and well-nourished. Obese No distress.  HEENT: head atraumatic, normocephalic, pupils equal and reactive to light,  neck supple, throat within normal limits Cardiovascular: Normal rate, regular rhythm and normal heart sounds.  No murmur heard. No BLE edema. Pulmonary/Chest: Effort normal and breath sounds normal. No respiratory distress. Abdominal: Soft.  There is no tenderness. Psychiatric: Patient has a normal mood and affect. behavior is normal. Judgment and thought content normal. Muscular Skeletal: right foot showed some pain on MCP first toe, but no redness or increase in warmth, pain present during palpation of lumbar spine, negative straight leg raise  PHQ2/9: Depression screen [facility]  2/9 08/21/2018 05/21/2018 08/15/2017 05/14/2017 02/11/2017  Decreased  Interest 0 0 0 0 0  Down, Depressed, Hopeless 0 0 0 0 0  PHQ - 2 Score 0 0 0 0 0  Altered sleeping - 0 - - -  Tired, decreased energy - 1 - - -  Change in appetite - 0 - - -  Feeling bad or failure about yourself  - 0 - - -  Trouble concentrating - 0 - - -  Moving slowly or fidgety/restless - 0 - - -  Suicidal thoughts - 0 - - -  PHQ-9 Score - 1 - - -  Difficult doing work/chores - Not difficult at all - - -     Fall Risk: Fall Risk  08/21/2018 05/21/2018 02/18/2018 11/18/2017 08/15/2017  Falls in the past year? No No No No No  Number falls in past yr: - - - - -  Injury with Fall? - - - - -     Functional Status Survey: Is the patient deaf or have difficulty hearing?: No Does the patient have difficulty seeing, even when wearing glasses/contacts?: No Does the patient have difficulty concentrating, remembering, or making decisions?: No Does the patient have difficulty walking or climbing stairs?: No Does the patient have difficulty dressing or bathing?: No Does the patient have difficulty doing errands alone such as visiting a doctor's office or shopping?: No   Assessment & Plan  1. Diabetes mellitus type 2 in obese ([place])  - Ambulatory referral to Ophthalmology - COMPLETE METABOLIC PANEL WITH GFR - Hemoglobin A1c - atorvastatin (LIPITOR) 10 MG tablet; Take 1 tablet (10 mg total) by mouth daily.  Dispense: 90 tablet; Refill: 1  2. Need for influenza vaccination  - Flu Vaccine QUAD 6+ mos PF IM (Fluarix Quad PF)  3. Chronic midline low back pain with sciatica, sciatica laterality unspecified  - HYDROcodone-acetaminophen (NORCO) 7.5-325 MG tablet; Take 1 tablet by mouth 3 (three) times daily as needed for moderate pain.  Dispense: 75 tablet; Refill: 0 - HYDROcodone-acetaminophen (NORCO) 7.5-325 MG tablet; Take 1 tablet by mouth 3 (three) times daily as needed.  Dispense: 75 tablet; Refill: 0 -  HYDROcodone-acetaminophen (NORCO) 7.5-325 MG tablet; Take 1 tablet by mouth 3 (three) times daily as needed for moderate pain.  Dispense: 75 tablet; Refill: 0  4. Chronic nonmalignant pain  - HYDROcodone-acetaminophen (NORCO) 7.5-325 MG tablet; Take 1 tablet by mouth 3 (three) times daily as needed for moderate pain.  Dispense: 75 tablet; Refill: 0 - HYDROcodone-acetaminophen (NORCO) 7.5-325 MG tablet; Take 1 tablet by mouth 3 (three) times daily as needed.  Dispense: 75 tablet; Refill: 0 - HYDROcodone-acetaminophen (NORCO) 7.5-325 MG tablet; Take 1 tablet by mouth 3 (three) times daily as needed for moderate pain.  Dispense: 75 tablet; Refill: 0  5. Vitamin D deficiency  - Vitamin D, Ergocalciferol, (DRISDOL) 50000 units CAPS capsule; Take weekly  Dispense: 12 capsule; Refill: 1  6. Other insomnia  - traZODone (DESYREL) 100 MG tablet; TAKE ONE TO ONE AND ONE-HALF TABLET BY MOUTH AT BEDTIME.  Dispense: 135 tablet; Refill: 0  7. History of gout  - Uric acid  8. Hypertension, benign  - losartan-hydrochlorothiazide (HYZAAR) 50-12.5 MG tablet; Take 1 tablet by mouth daily.  Dispense: 90 tablet; Refill: 1 - CBC with Differential/Platelet - COMPLETE METABOLIC PANEL WITH GFR  9. Acquired hypothyroidism  -TSH  10. Morbid obesity ([place])  Discussed with the patient the risk posed by an increased BMI. Discussed importance of portion control, calorie counting and at least 150 minutes of physical activity weekly. Avoid  sweet beverages and drink more water. Eat at least 6 servings of fruit and vegetables daily   11. Bariatric surgery status  - Magnesium 500 MG CAPS; Take 1 capsule (500 mg total) by mouth daily.  Dispense: 90 capsule; Refill: 3 - Calcium Carbonate-Vit D-Min (CALTRATE 600+D PLUS MINERALS) 600-800 MG-UNIT CHEW; Chew 1 tablet by mouth daily.  Dispense: 180 tablet; Refill: 1 - multivitamin-iron-minerals-folic acid (CENTRUM) chewable tablet; Chew 1 tablet by mouth daily.  Dispense:  90 tablet; Refill: 3  12. Dyslipidemia  Willing to start statin therapy   13. Anemia, unspecified type  - ferrous sulfate 325 (65 FE) MG tablet; Take 1 tablet (325 mg total) by mouth daily with breakfast.  Dispense: 90 tablet; Refill: 3 - Cyanocobalamin (VITAMIN B-12) 500 MCG SUBL; Place 1 tablet (500 mcg total) under the tongue daily.  Dispense: 90 tablet; Refill: 3 - CBC with Differential/Platelet  14. Perennial allergic rhinitis with seasonal variation  - fexofenadine (ALLEGRA ALLERGY) 180 MG tablet; Take 1 tablet (180 mg total) by mouth daily.  Dispense: 90 tablet; Refill: 3

## 2018-08-22 LAB — CBC WITH DIFFERENTIAL/PLATELET
Basophils Absolute: 41 cells/uL (ref 0–200)
Basophils Relative: 0.7 %
Eosinophils Absolute: 232 cells/uL (ref 15–500)
Eosinophils Relative: 4 %
HCT: 41.5 % (ref 38.5–50.0)
Hemoglobin: 13.7 g/dL (ref 13.2–17.1)
Lymphs Abs: 1496 cells/uL (ref 850–3900)
MCH: 28.3 pg (ref 27.0–33.0)
MCHC: 33 g/dL (ref 32.0–36.0)
MCV: 85.7 fL (ref 80.0–100.0)
MPV: 10.2 fL (ref 7.5–12.5)
Monocytes Relative: 9.3 %
Neutro Abs: 3492 cells/uL (ref 1500–7800)
Neutrophils Relative %: 60.2 %
Platelets: 188 10*3/uL (ref 140–400)
RBC: 4.84 10*6/uL (ref 4.20–5.80)
RDW: 13.2 % (ref 11.0–15.0)
Total Lymphocyte: 25.8 %
WBC mixed population: 539 cells/uL (ref 200–950)
WBC: 5.8 10*3/uL (ref 3.8–10.8)

## 2018-08-22 LAB — COMPLETE METABOLIC PANEL WITH GFR
AG Ratio: 2.1 (calc) (ref 1.0–2.5)
ALT: 16 U/L (ref 9–46)
AST: 22 U/L (ref 10–40)
Albumin: 4.4 g/dL (ref 3.6–5.1)
Alkaline phosphatase (APISO): 97 U/L (ref 40–115)
BUN/Creatinine Ratio: 20 (calc) (ref 6–22)
BUN: 26 mg/dL — ABNORMAL HIGH (ref 7–25)
CO2: 28 mmol/L (ref 20–32)
Calcium: 9.3 mg/dL (ref 8.6–10.3)
Chloride: 107 mmol/L (ref 98–110)
Creat: 1.31 mg/dL (ref 0.60–1.35)
GFR, Est African American: 75 mL/min/{1.73_m2} (ref >=60)
GFR, Est Non African American: 65 mL/min/{1.73_m2} (ref >=60)
Globulin: 2.1 g/dL (calc) (ref 1.9–3.7)
Glucose, Bld: 121 mg/dL — ABNORMAL HIGH (ref 65–99)
Potassium: 4.5 mmol/L (ref 3.5–5.3)
Sodium: 142 mmol/L (ref 135–146)
Total Bilirubin: 0.8 mg/dL (ref 0.2–1.2)
Total Protein: 6.5 g/dL (ref 6.1–8.1)

## 2018-08-22 LAB — HEMOGLOBIN A1C
Hgb A1c MFr Bld: 6.4 % of total Hgb — ABNORMAL HIGH (ref <5.7)
Mean Plasma Glucose: 137 (calc)
eAG (mmol/L): 7.6 (calc)

## 2018-08-22 LAB — URIC ACID: Uric Acid, Serum: 7.3 mg/dL (ref 4.0–8.0)

## 2018-08-22 LAB — TSH: TSH: 0.87 mIU/L (ref 0.40–4.50)

## 2018-08-23 ENCOUNTER — Other Ambulatory Visit: Payer: Self-pay | Admitting: Family Medicine

## 2018-08-23 MED ORDER — ALLOPURINOL 100 MG PO TABS: 100.0000 mg | ORAL_TABLET | Freq: Every day | ORAL | 0 refills | Status: DC

## 2018-09-30 LAB — HM DIABETES EYE EXAM

## 2018-10-01 ENCOUNTER — Encounter: Payer: Self-pay | Admitting: Family Medicine

## 2018-10-06 ENCOUNTER — Encounter: Payer: Self-pay | Admitting: Family Medicine

## 2018-10-06 ENCOUNTER — Telehealth: Payer: Self-pay | Admitting: Family Medicine

## 2018-10-06 NOTE — Telephone Encounter (Signed)
I checked the last office visit note and did not see anything mentioned but I will forward this to Dr. Ancil Boozer to see what medication are they referring to.   Would it be indomethacin, Colchicine or something else?

## 2018-10-06 NOTE — Telephone Encounter (Signed)
Copied from [place] [id number]. Topic: General - Other >> Oct 06, 2018  9:44 AM Valla Leaver wrote: Reason for CRM: Patient's wife Lenna Sciara calling to find out what the second medication was for gout that was discussed? Patient is having a flare and currently has the allopurinol but she says Dr. Ancil Boozer suggested taking a 2nd script to help with inflammation.

## 2018-10-07 ENCOUNTER — Other Ambulatory Visit: Payer: Self-pay | Admitting: Nurse Practitioner

## 2018-10-07 ENCOUNTER — Other Ambulatory Visit: Payer: Self-pay | Admitting: Family Medicine

## 2018-10-07 MED ORDER — COLCHICINE 0.6 MG PO TABS: ORAL_TABLET | ORAL | 0 refills | Status: DC

## 2018-10-07 NOTE — Telephone Encounter (Signed)
He should have colchicine at home and if no improvement with colchicine he needs to be seen

## 2018-10-07 NOTE — Telephone Encounter (Signed)
Patient is calling to follow up on this as he is having a gout flare up. Can be reached at [phone]

## 2018-10-08 ENCOUNTER — Encounter: Payer: Self-pay | Admitting: Nurse Practitioner

## 2018-10-08 ENCOUNTER — Ambulatory Visit: Payer: 59 | Admitting: Nurse Practitioner

## 2018-10-08 VITALS — BP 100/74 | HR 78 | Temp 98.1°F | Resp 16 | Ht 69.0 in | Wt 330.0 lb

## 2018-10-08 DIAGNOSIS — M109 Gout, unspecified: Secondary | ICD-10-CM | POA: Diagnosis not present

## 2018-10-08 DIAGNOSIS — M1712 Unilateral primary osteoarthritis, left knee: Secondary | ICD-10-CM

## 2018-10-08 MED ORDER — PREDNISONE 10 MG (21) PO TBPK: ORAL_TABLET | ORAL | 0 refills | Status: DC

## 2018-10-08 MED ORDER — DICLOFENAC SODIUM 1 % TD GEL: 4.0000 g | Freq: Four times a day (QID) | TRANSDERMAL | 1 refills | Status: DC

## 2018-10-08 NOTE — Progress Notes (Signed)
Name: [name]   MRN: [id number]    DOB: [birth year]   Date:10/08/2018       Progress Note  Subjective  Chief Complaint  Chief Complaint  Patient presents with  . Gout    right foot x 1 week.     HPI  States last Saturday right toe pain at MTP joint started 6 days ago. Has had his first gout flare this year, went to [facility] clinic and resolved with prednisone taper. Patient came to office yesterday prior to closing requesting medications- colchicine called in. Patient states took 2 pills yesterday as prescribed, states pain was the worst ever last night.  Patient has tried ibuprofen, ice, Voltaren gel this past week with minimal relief.  Patient states this pain is just at the joint it is red and swollen.  Patient does not drink alcohol, states has not had any seafood or sweet breads.  Did have one meal with red meat and had taken some Goody powders.  Takes allopurinol daily for prevention of flares, has a small amount of HCTZ and blood pressure medication but has been unchanged.  Patient denies injury to foot, no ulcers.  No other joints affected.  Patient also notes chronic knee pain, controlled with Voltaren gel as needed requesting refill.    Patient Active Problem List   Diagnosis Date Noted  . Chronic midline low back pain with sciatica 02/11/2017  . Anemia, unspecified 06/12/2016  . B12 deficiency 06/11/2016  . Allergic conjunctivitis 06/11/2016  . Hypocalcemia 06/11/2016  . Muscle cramps 06/11/2016  . Hypertension, benign 11/03/2015  . Dyslipidemia 11/02/2015  . Acquired nystagmus 08/01/2015  . Chronic back pain 06/29/2015  . Carpal tunnel syndrome 06/29/2015  . Osteoarthritis 06/29/2015  . Claustrophobia 06/29/2015  . Foot drop, left 06/29/2015  . Hiatal hernia 06/29/2015  . H/O diabetes mellitus 06/29/2015  . H/O testicular cancer 06/29/2015  . Acquired hypothyroidism 06/29/2015  . Dysmetabolic syndrome 19/41/7408  . Morbid obesity ([place]) 06/29/2015  .  Allergic rhinitis 06/29/2015  . Neuropathy 06/29/2015  . Seborrheic keratoses 06/29/2015  . Vitamin D deficiency 06/29/2015  . Bariatric surgery status 02/20/2012    Past Medical History:  Diagnosis Date  . Allergy   . Gout   . Hiatal hernia   . Hypothyroidism   . Metabolic syndrome   . Obesity   . OSA on CPAP    11 mmhg CPAP  . Radiculopathy   . Seborrhea   . Testicular hypofunction   . Urine ketone   . Vitamin D deficiency     Past Surgical History:  Procedure Laterality Date  . BACK SURGERY     X 2  . HERNIA REPAIR     left side and umbilical hernia repair  . 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 URETEROTOMY Left 2000    Social History   Tobacco Use  . Smoking status: Former Smoker    Packs/day: 3.00    Years: 8.00    Pack years: 24.00    Types: Cigarettes, Cigars, Pipe    Start date: 06/28/1984    Last attempt to quit: 12/24/1991    Years since quitting: 26.8  . Smokeless tobacco: Former Systems developer    Types: Chew    Quit date: 12/24/1991  Substance Use Topics  . Alcohol use: No    Alcohol/week: 0.0 standard drinks     Current Outpatient Medications:  .  allopurinol (ZYLOPRIM) 100 MG tablet, Take 1 tablet (100 mg total) by mouth daily., Disp: 90  tablet, Rfl: 0 .  Calcium Carbonate-Vit D-Min (CALTRATE 600+D PLUS MINERALS) 600-800 MG-UNIT CHEW, Chew 1 tablet by mouth daily., Disp: 180 tablet, Rfl: 1 .  Cobalamine Combinations (B-12) [phone] MCG SUBL, , Disp: , Rfl: 99 .  colchicine 0.6 MG tablet, Take 2 tablets today and one each day thereafter., Disp: 4 tablet, Rfl: 0 .  Cyanocobalamin (VITAMIN B-12) 500 MCG SUBL, Place 1 tablet (500 mcg total) under the tongue daily., Disp: 90 tablet, Rfl: 3 .  cyclobenzaprine (FLEXERIL) 10 MG tablet, Take 1 tablet by mouth at bedtime., Disp: , Rfl:  .  diclofenac sodium (VOLTAREN) 1 % GEL, Apply 4 g topically 4 (four) times daily. Reported on 06/11/2016, Disp: 100 g, Rfl: 1 .  ferrous sulfate 325 (65 FE) MG tablet, Take 1 tablet  (325 mg total) by mouth daily with breakfast., Disp: 90 tablet, Rfl: 3 .  fexofenadine (ALLEGRA ALLERGY) 180 MG tablet, Take 1 tablet (180 mg total) by mouth daily., Disp: 90 tablet, Rfl: 3 .  Homeopathic Products ([employer] LEG CRAMPS PAIN RELIEF PO), Take 2 tablets by mouth 2 (two) times daily at 10 am and 4 pm., Disp: , Rfl:  .  HYDROcodone-acetaminophen (NORCO) 7.5-325 MG tablet, Take 1 tablet by mouth 3 (three) times daily as needed for moderate pain., Disp: 75 tablet, Rfl: 0 .  HYDROcodone-acetaminophen (NORCO) 7.5-325 MG tablet, Take 1 tablet by mouth 3 (three) times daily as needed., Disp: 75 tablet, Rfl: 0 .  HYDROcodone-acetaminophen (NORCO) 7.5-325 MG tablet, Take 1 tablet by mouth 3 (three) times daily as needed for moderate pain., Disp: 75 tablet, Rfl: 0 .  losartan-hydrochlorothiazide (HYZAAR) 50-12.5 MG tablet, Take 1 tablet by mouth daily., Disp: 90 tablet, Rfl: 0 .  Magnesium 500 MG CAPS, Take 1 capsule (500 mg total) by mouth daily., Disp: 90 capsule, Rfl: 3 .  Melatonin-Pyridoxine (MELATONEX PO), Take by mouth., Disp: , Rfl:  .  multivitamin-iron-minerals-folic acid (CENTRUM) chewable tablet, Chew 1 tablet by mouth daily., Disp: 90 tablet, Rfl: 3 .  Potassium 99 MG TABS, Take by mouth., Disp: , Rfl:  .  pregabalin (LYRICA) 225 MG capsule, TAKE 1 CAPSULE TWO TIMES A DAY., Disp: 180 capsule, Rfl: 1 .  SYNTHROID 137 MCG tablet, TAKE 1 TABLET EVERY DAY AND 1 AND ONE-HALF TABLETS ON SUNDAYS, Disp: 90 tablet, Rfl: 1 .  traZODone (DESYREL) 100 MG tablet, TAKE ONE TO ONE AND ONE-HALF TABLET BY MOUTH AT BEDTIME., Disp: 135 tablet, Rfl: 0 .  atorvastatin (LIPITOR) 10 MG tablet, Take 1 tablet (10 mg total) by mouth daily., Disp: 90 tablet, Rfl: 1 .  ketoconazole (NIZORAL) 2 % cream, Apply 1 application topically daily. (Patient not taking: Reported on 10/08/2018), Disp: 60 g, Rfl: 1 .  Vitamin D, Ergocalciferol, (DRISDOL) 50000 units CAPS capsule, Take weekly (Patient not taking: Reported on  10/08/2018), Disp: 12 capsule, Rfl: 1  Allergies  Allergen Reactions  . Morphine Itching    ROS   No other specific complaints in a complete review of systems (except as listed in HPI above).  Objective  Vitals:   10/08/18 0808  BP: 100/74  Pulse: 78  Resp: 16  Temp: 98.1 F (36.7 C)  TempSrc: Oral  SpO2: 98%  Weight: (!) 330 lb (149.7 kg)  Height: 5\' 9"  (1.753 m)    Body mass index is 48.73 kg/m.  Nursing Note and Vital Signs reviewed.  Physical Exam  Constitutional: He is oriented to person, place, and time. He appears well-developed.  Obese   Cardiovascular:  Normal rate, regular rhythm and intact distal pulses.  Pulmonary/Chest: Effort normal and breath sounds normal.  Neurological: He is alert and oriented to person, place, and time.  Skin: Skin is warm and dry. Capillary refill takes less than 2 seconds. There is erythema (right foot, first digit MTP joint swollen, red and tender).  Psychiatric: He has a normal mood and affect. His behavior is normal. Judgment and thought content normal.      No results found for this or any previous visit (from the past 48 hour(s)).  Assessment & Plan  1. Acute gout involving toe of right foot, unspecified cause Stop colchicine, cautioned on NSAID use, discussed side effects of prednisone.  - predniSONE (STERAPRED UNI-PAK 21 TAB) 10 MG (21) TBPK tablet; Please follow dose pack directions  Dispense: 21 tablet; Refill: 0  2. Primary osteoarthritis of left knee - diclofenac sodium (VOLTAREN) 1 % GEL; Apply 4 g topically 4 (four) times daily. Reported on 06/11/2016  Dispense: 100 g; Refill: 1  3. Morbid obesity ([place]) Cautioned on polyphagia side effect of prednisone, instructed on healthy diet and foods to avoid.

## 2018-10-08 NOTE — Telephone Encounter (Signed)
I tried to reach this patient but there was no answer. A message was left for him with Dr. Kathe Becton recommendation on his voicemail.

## 2018-10-20 ENCOUNTER — Telehealth: Payer: Self-pay | Admitting: Nurse Practitioner

## 2018-10-20 DIAGNOSIS — M1000 Idiopathic gout, unspecified site: Secondary | ICD-10-CM

## 2018-10-20 NOTE — Telephone Encounter (Signed)
Pt called to find out about this.  Pt was told a referral was being placed for him.  Pt wants to know if there is any medication that can be RXd now since he can't put any weight on his foot.

## 2018-10-20 NOTE — Telephone Encounter (Signed)
I am placing referral to podiatrist

## 2018-10-20 NOTE — Telephone Encounter (Signed)
Copied from [place] [id number]. Topic: General - Other >> Oct 20, 2018  9:40 AM Cecelia Byars, NT wrote: Reason for CRM: Patient called and said he  is in a lot of pain from gout and would like something stronger called for the pain at [place], [place] - [address] [phone] (Phone) [phone] (Fax)

## 2018-10-21 ENCOUNTER — Other Ambulatory Visit: Payer: Self-pay | Admitting: Family Medicine

## 2018-10-21 NOTE — Telephone Encounter (Signed)
Please advise 

## 2018-10-21 NOTE — Telephone Encounter (Signed)
The referral was just placed and sent on yesterday so no appointment has been made as of today.

## 2018-10-21 NOTE — Telephone Encounter (Signed)
Can you check to find out when appointment will be made?  He is still on prednisone and cannot take any nsaid's ( alevel/motrin - while taking prednisone), he did not respond to colchicine. He will need to have podiatrist do an injection on his painful joint

## 2018-10-22 ENCOUNTER — Other Ambulatory Visit: Payer: Self-pay | Admitting: Family Medicine

## 2018-10-22 DIAGNOSIS — G8929 Other chronic pain: Secondary | ICD-10-CM

## 2018-10-22 DIAGNOSIS — R252 Cramp and spasm: Secondary | ICD-10-CM

## 2018-10-22 DIAGNOSIS — M5440 Lumbago with sciatica, unspecified side: Secondary | ICD-10-CM

## 2018-10-22 DIAGNOSIS — E559 Vitamin D deficiency, unspecified: Secondary | ICD-10-CM

## 2018-10-29 ENCOUNTER — Other Ambulatory Visit: Payer: Self-pay | Admitting: Family Medicine

## 2018-11-18 ENCOUNTER — Telehealth: Payer: Self-pay

## 2018-11-18 ENCOUNTER — Ambulatory Visit: Payer: 59 | Admitting: Family Medicine

## 2018-11-18 ENCOUNTER — Encounter: Payer: Self-pay | Admitting: Family Medicine

## 2018-11-18 DIAGNOSIS — G8929 Other chronic pain: Secondary | ICD-10-CM

## 2018-11-18 DIAGNOSIS — M109 Gout, unspecified: Secondary | ICD-10-CM | POA: Diagnosis not present

## 2018-11-18 DIAGNOSIS — E1169 Type 2 diabetes mellitus with other specified complication: Secondary | ICD-10-CM

## 2018-11-18 DIAGNOSIS — Z23 Encounter for immunization: Secondary | ICD-10-CM | POA: Diagnosis not present

## 2018-11-18 DIAGNOSIS — E669 Obesity, unspecified: Secondary | ICD-10-CM

## 2018-11-18 DIAGNOSIS — G4709 Other insomnia: Secondary | ICD-10-CM

## 2018-11-18 DIAGNOSIS — M5440 Lumbago with sciatica, unspecified side: Secondary | ICD-10-CM

## 2018-11-18 DIAGNOSIS — L219 Seborrheic dermatitis, unspecified: Secondary | ICD-10-CM

## 2018-11-18 DIAGNOSIS — R252 Cramp and spasm: Secondary | ICD-10-CM

## 2018-11-18 DIAGNOSIS — E039 Hypothyroidism, unspecified: Secondary | ICD-10-CM

## 2018-11-18 DIAGNOSIS — K047 Periapical abscess without sinus: Secondary | ICD-10-CM

## 2018-11-18 MED ORDER — SYNTHROID 137 MCG PO TABS: ORAL_TABLET | ORAL | 1 refills | Status: DC

## 2018-11-18 MED ORDER — HYDROCODONE-ACETAMINOPHEN 7.5-325 MG PO TABS: 1.0000 | ORAL_TABLET | Freq: Three times a day (TID) | ORAL | 0 refills | Status: DC | PRN

## 2018-11-18 MED ORDER — AMOXICILLIN-POT CLAVULANATE 875-125 MG PO TABS: 1.0000 | ORAL_TABLET | Freq: Two times a day (BID) | ORAL | 0 refills | Status: DC

## 2018-11-18 MED ORDER — DESONIDE 0.05 % EX CREA: TOPICAL_CREAM | Freq: Two times a day (BID) | CUTANEOUS | 0 refills | Status: DC

## 2018-11-18 MED ORDER — TRAZODONE HCL 100 MG PO TABS: ORAL_TABLET | ORAL | 0 refills | Status: DC

## 2018-11-18 MED ORDER — CYCLOBENZAPRINE HCL 10 MG PO TABS: 10.0000 mg | ORAL_TABLET | Freq: Every day | ORAL | 0 refills | Status: DC

## 2018-11-18 MED ORDER — PREGABALIN 225 MG PO CAPS: ORAL_CAPSULE | ORAL | 1 refills | Status: DC

## 2018-11-18 MED ORDER — LOSARTAN POTASSIUM-HCTZ 50-12.5 MG PO TABS: 1.0000 | ORAL_TABLET | Freq: Every day | ORAL | 1 refills | Status: DC

## 2018-11-18 NOTE — Progress Notes (Signed)
Name: [name]   MRN: [id number]    DOB: 05/03/1971   Date:11/18/2018       Progress Note  Subjective  Chief Complaint  Chief Complaint  Patient presents with  . Diabetes  . Hypertension    HPI  DMII: diet controlled since bariatric surgery,but hgbA1C is gradually increasingbut has been stable over the past year. it was6.4%,6.3%and 6.4% again He has been , trying to eat better, he tends to eat healthier Denies polyphagia, polydipsia or polyuria Eye exam up to date.Urine micro is up to date He does not check glucose at home  Insomnia: he states sleep is better, no problems at this time  Muscle Cramps: he states symptoms have improved with B12, D and calcium supplementation but still has cramps sometimes at night. Discussed stretching and staying hydrated   Hypothyroidism: taking same dose of Synthroid for many months, last TSH at goal. He has chronic dry skin, no hair lossorconstipation.weight has been stable since last visit.   Chronic low back pain : he is taking Norco 7.5/325mg  about three times per day,he used to get 100 pills during the winter months, but is down to 75 during Summer, medication has been lasting until follow up visits. Reviewed [place] controlled substance database and only other prescriber in the  past year for a cough medicationHe denies misuse or selling. Taking Lyricaand it seems to help with pain.No longer under disability , working full time and has a physical job Office manager, he is out in the elements and cold makes pain worse so we will give him 100 pills per month again. Pain contract re-singed02/2019, drug screen normal 04/2018  Neuropathy: he has bilateral daily pain on both legs, secondary to chronic back problems, aching like, he denies burning or prickly sensation, Lyrica helps.Current pain of810 , he states is worse because it is raining and cold.   History of bariatric surgery: surgery 09/2012 was he is morbidly obesehis  weight at the time was 396 lbs- lowest weight after surgery was 275 lbs,he gradually gained weight back and is currently at 321 lbs but stable since last visit He is now packing his lunch instead of eating out. He is drinking more water now. His job is very active   HTN:bp was towards low end of normal, we decreased dose and bp is good today, no chest pain, palpitation or dizziness.   Right foot gout: seen at Urgent Care, had multiple rounds of prednisone and also colchicine but pain only resolved with injection of first right toe, uric acid was 7.1 we will recheck level he is taking allopurinol 100 mg daily and if not at goal we will increase dose   Toothache: he has to get all teeth extracted over the past couple of days he has developed toothache and jaw swelling, no fever or chills. Explained that antibiotics will not fix the problem and needs to follow up with dentist   Patient Active Problem List   Diagnosis Date Noted  . Chronic midline low back pain with sciatica 02/11/2017  . Anemia, unspecified 06/12/2016  . B12 deficiency 06/11/2016  . Allergic conjunctivitis 06/11/2016  . Hypocalcemia 06/11/2016  . Muscle cramps 06/11/2016  . Hypertension, benign 11/03/2015  . Dyslipidemia 11/02/2015  . Acquired nystagmus 08/01/2015  . Chronic back pain 06/29/2015  . Carpal tunnel syndrome 06/29/2015  . Osteoarthritis 06/29/2015  . Claustrophobia 06/29/2015  . Foot drop, left 06/29/2015  . Hiatal hernia 06/29/2015  . H/O diabetes mellitus 06/29/2015  . H/O  testicular cancer 06/29/2015  . Acquired hypothyroidism 06/29/2015  . Dysmetabolic syndrome 16/09/9603  . Morbid obesity ([place]) 06/29/2015  . Allergic rhinitis 06/29/2015  . Neuropathy 06/29/2015  . Seborrheic keratoses 06/29/2015  . Vitamin D deficiency 06/29/2015  . Bariatric surgery status 02/20/2012    Past Surgical History:  Procedure Laterality Date  . BACK SURGERY     X 2  . HERNIA REPAIR     left side and  umbilical hernia repair  . 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 URETEROTOMY Left 2000    Family History  Problem Relation Age of Onset  . Coronary artery disease Mother   . Cancer Father        Melanoma and Testicular  . Diabetes Father     Social History   Socioeconomic History  . Marital status: Married    Spouse name: Not on file  . Number of children: Not on file  . Years of education: Not on file  . Highest education level: Not on file  Occupational History  . Not on file  Social Needs  . Financial resource strain: Not on file  . Food insecurity:    Worry: Not on file    Inability: Not on file  . Transportation needs:    Medical: Not on file    Non-medical: Not on file  Tobacco Use  . Smoking status: Former Smoker    Packs/day: 3.00    Years: 8.00    Pack years: 24.00    Types: Cigarettes, Cigars, Pipe    Start date: 06/28/1984    Last attempt to quit: 12/24/1991    Years since quitting: 26.9  . Smokeless tobacco: Former Systems developer    Types: [place] date: 12/24/1991  Substance and Sexual Activity  . Alcohol use: No    Alcohol/week: 0.0 standard drinks  . Drug use: No  . Sexual activity: Yes    Partners: Female  Lifestyle  . Physical activity:    Days per week: Not on file    Minutes per session: Not on file  . Stress: Not on file  Relationships  . Social connections:    Talks on phone: Not on file    Gets together: Not on file    Attends religious service: Not on file    Active member of club or organization: Not on file    Attends meetings of clubs or organizations: Not on file    Relationship status: Not on file  . Intimate partner violence:    Fear of current or ex partner: Not on file    Emotionally abused: Not on file    Physically abused: Not on file    Forced sexual activity: Not on file  Other Topics Concern  . Not on file  Social History Narrative  . Not on file     Current Outpatient Medications:  .  allopurinol (ZYLOPRIM) 100 MG  tablet, Take 1 tablet (100 mg total) by mouth daily., Disp: 90 tablet, Rfl: 0 .  atorvastatin (LIPITOR) 10 MG tablet, Take 1 tablet (10 mg total) by mouth daily., Disp: 90 tablet, Rfl: 1 .  Calcium Carbonate-Vit D-Min (CALTRATE 600+D PLUS MINERALS) 600-800 MG-UNIT CHEW, Chew 1 tablet by mouth daily., Disp: 180 tablet, Rfl: 1 .  Cobalamine Combinations (B-12) [phone] MCG SUBL, , Disp: , Rfl: 99 .  Cyanocobalamin (VITAMIN B-12) 500 MCG SUBL, Place 1 tablet (500 mcg total) under the tongue daily., Disp: 90 tablet, Rfl: 3 .  cyclobenzaprine (FLEXERIL)  10 MG tablet, Take 1 tablet (10 mg total) by mouth at bedtime., Disp: 90 tablet, Rfl: 0 .  diclofenac sodium (VOLTAREN) 1 % GEL, Apply 4 g topically 4 (four) times daily. Reported on 06/11/2016, Disp: 100 g, Rfl: 1 .  ferrous sulfate 325 (65 FE) MG tablet, Take 1 tablet (325 mg total) by mouth daily with breakfast., Disp: 90 tablet, Rfl: 3 .  fexofenadine (ALLEGRA ALLERGY) 180 MG tablet, Take 1 tablet (180 mg total) by mouth daily., Disp: 90 tablet, Rfl: 3 .  Homeopathic Products ([employer] LEG CRAMPS PAIN RELIEF PO), Take 2 tablets by mouth 2 (two) times daily at 10 am and 4 pm., Disp: , Rfl:  .  HYDROcodone-acetaminophen (NORCO) 7.5-325 MG tablet, Take 1 tablet by mouth 3 (three) times daily as needed for moderate pain., Disp: 75 tablet, Rfl: 0 .  HYDROcodone-acetaminophen (NORCO) 7.5-325 MG tablet, Take 1 tablet by mouth 3 (three) times daily as needed., Disp: 75 tablet, Rfl: 0 .  HYDROcodone-acetaminophen (NORCO) 7.5-325 MG tablet, Take 1 tablet by mouth 3 (three) times daily as needed for moderate pain., Disp: 75 tablet, Rfl: 0 .  ketoconazole (NIZORAL) 2 % cream, Apply 1 application topically daily., Disp: 60 g, Rfl: 1 .  losartan-hydrochlorothiazide (HYZAAR) 50-12.5 MG tablet, Take 1 tablet by mouth daily., Disp: 90 tablet, Rfl: 0 .  Magnesium 500 MG CAPS, Take 1 capsule (500 mg total) by mouth daily., Disp: 90 capsule, Rfl: 3 .  Melatonin-Pyridoxine  (MELATONEX PO), Take by mouth., Disp: , Rfl:  .  multivitamin-iron-minerals-folic acid (CENTRUM) chewable tablet, Chew 1 tablet by mouth daily., Disp: 90 tablet, Rfl: 3 .  Potassium 99 MG TABS, Take by mouth., Disp: , Rfl:  .  pregabalin (LYRICA) 225 MG capsule, TAKE 1 CAPSULE TWO TIMES A DAY., Disp: 180 capsule, Rfl: 1 .  SYNTHROID 137 MCG tablet, TAKE 1 TABLET EVERY DAY AND 1 AND ONE-HALF TABLETS ON SUNDAYS, Disp: 90 tablet, Rfl: 1 .  traZODone (DESYREL) 100 MG tablet, TAKE ONE TO ONE AND ONE-HALF TABLET BY MOUTH AT BEDTIME., Disp: 135 tablet, Rfl: 0 .  Vitamin D, Ergocalciferol, (DRISDOL) 50000 units CAPS capsule, TAKE 1 CAPSULE BY MOUTH EVERY 7 DAYS, Disp: 4 capsule, Rfl: 0  Allergies  Allergen Reactions  . Morphine Itching    I personally reviewed active problem list, medication list, allergies, family history, social history with the patient/caregiver today.   ROS  Constitutional: Negative for fever or weight change.  Respiratory: Negative for cough and shortness of breath.   Cardiovascular: Negative for chest pain or palpitations.  Gastrointestinal: Negative for abdominal pain, no bowel changes.  Musculoskeletal: Negative for gait problem or joint swelling.  Skin: Negative for rash.  Neurological: Negative for dizziness or headache.  No other specific complaints in a complete review of systems (except as listed in HPI above).   Objective  Vitals:   11/18/18 1014  BP: 110/86  Pulse: 82  Resp: 16  Temp: 97.9 F (36.6 C)  TempSrc: Oral  SpO2: 99%  Weight: (!) 321 lb 14.4 oz (146 kg)  Height: 5\' 9"  (1.753 m)    Body mass index is 47.54 kg/m.  Physical Exam  Constitutional: Patient appears well-developed and well-nourished. Obese  No distress.  HEENT: head atraumatic, normocephalic, pupils equal and reactive to light, strabismus , neck supple, throat within normal limits, poor dentition, he has pain during palpation of right lower molars Cardiovascular: Normal  rate, regular rhythm and normal heart sounds.  No murmur heard. No BLE edema.  Pulmonary/Chest: Effort normal and breath sounds normal. No respiratory distress. Abdominal: Soft.  There is no tenderness. Skin: tattoos  Psychiatric: Patient has a normal mood and affect. behavior is normal. Judgment and thought content normal.  Recent Results (from the past 2160 hour(s))  CBC with Differential/Platelet     Status: None   Collection Time: 08/21/18  9:45 AM  Result Value Ref Range   WBC 5.8 3.8 - 10.8 Thousand/uL   RBC 4.84 4.20 - 5.80 Million/uL   Hemoglobin 13.7 13.2 - 17.1 g/dL   HCT 41.5 38.5 - 50.0 %   MCV 85.7 80.0 - 100.0 fL   MCH 28.3 27.0 - 33.0 pg   MCHC 33.0 32.0 - 36.0 g/dL   RDW 13.2 11.0 - 15.0 %   Platelets 188 140 - 400 Thousand/uL   MPV 10.2 7.5 - 12.5 fL   Neutro Abs 3,492 1,500 - 7,800 cells/uL   Lymphs Abs 1,496 850 - 3,900 cells/uL   WBC mixed population 539 200 - 950 cells/uL   Eosinophils Absolute 232 15 - 500 cells/uL   Basophils Absolute 41 0 - 200 cells/uL   Neutrophils Relative % 60.2 %   Total Lymphocyte 25.8 %   Monocytes Relative 9.3 %   Eosinophils Relative 4.0 %   Basophils Relative 0.7 %  COMPLETE METABOLIC PANEL WITH GFR     Status: Abnormal   Collection Time: 08/21/18  9:45 AM  Result Value Ref Range   Glucose, Bld 121 (H) 65 - 99 mg/dL    Comment: .            Fasting reference interval . For someone without known diabetes, a glucose value between 100 and 125 mg/dL is consistent with prediabetes and should be confirmed with a follow-up test. .    BUN 26 (H) 7 - 25 mg/dL   Creat 1.31 0.60 - 1.35 mg/dL   GFR, Est Non African American 65 > OR = 60 mL/min/1.56m2   GFR, Est African American 75 > OR = 60 mL/min/1.65m2   BUN/Creatinine Ratio 20 6 - 22 (calc)   Sodium 142 135 - 146 mmol/L   Potassium 4.5 3.5 - 5.3 mmol/L   Chloride 107 98 - 110 mmol/L   CO2 28 20 - 32 mmol/L   Calcium 9.3 8.6 - 10.3 mg/dL   Total Protein 6.5 6.1 - 8.1 g/dL    Albumin 4.4 3.6 - 5.1 g/dL   Globulin 2.1 1.9 - 3.7 g/dL (calc)   AG Ratio 2.1 1.0 - 2.5 (calc)   Total Bilirubin 0.8 0.2 - 1.2 mg/dL   Alkaline phosphatase (APISO) 97 40 - 115 U/L   AST 22 10 - 40 U/L   ALT 16 9 - 46 U/L  Hemoglobin A1c     Status: Abnormal   Collection Time: 08/21/18  9:45 AM  Result Value Ref Range   Hgb A1c MFr Bld 6.4 (H) <5.7 % of total Hgb    Comment: For someone without known diabetes, a hemoglobin  A1c value between 5.7% and 6.4% is consistent with prediabetes and should be confirmed with a  follow-up test. . For someone with known diabetes, a value <7% indicates that their diabetes is well controlled. A1c targets should be individualized based on duration of diabetes, age, comorbid conditions, and other considerations. . This assay result is consistent with an increased risk of diabetes. . Currently, no consensus exists regarding use of hemoglobin A1c for diagnosis of diabetes for children. .    Mean Plasma  Glucose 137 (calc)   eAG (mmol/L) 7.6 (calc)  Uric acid     Status: None   Collection Time: 08/21/18  9:45 AM  Result Value Ref Range   Uric Acid, Serum 7.3 4.0 - 8.0 mg/dL    Comment: Therapeutic target for gout patients: <6.0 mg/dL .   TSH     Status: None   Collection Time: 08/21/18  9:45 AM  Result Value Ref Range   TSH 0.87 0.40 - 4.50 mIU/L  HM DIABETES EYE EXAM     Status: None   Collection Time: 09/30/18 12:00 AM  Result Value Ref Range   HM Diabetic Eye Exam No Retinopathy No Retinopathy    Comment: [facility]- Dr. Sandra Cockayne      PHQ2/9: Depression screen [facility] 2/9 08/21/2018 05/21/2018 08/15/2017 05/14/2017 02/11/2017  Decreased Interest 0 0 0 0 0  Down, Depressed, Hopeless 0 0 0 0 0  PHQ - 2 Score 0 0 0 0 0  Altered sleeping - 0 - - -  Tired, decreased energy - 1 - - -  Change in appetite - 0 - - -  Feeling bad or failure about yourself  - 0 - - -  Trouble concentrating - 0 - - -  Moving slowly or  fidgety/restless - 0 - - -  Suicidal thoughts - 0 - - -  PHQ-9 Score - 1 - - -  Difficult doing work/chores - Not difficult at all - - -     Fall Risk: Fall Risk  11/18/2018 10/08/2018 08/21/2018 05/21/2018 02/18/2018  Falls in the past year? 0 No No No No  Number falls in past yr: - - - - -  Injury with Fall? - - - - -     Assessment & Plan  1. Morbid obesity ([place])  Discussed with the patient the risk posed by an increased BMI. Discussed importance of portion control, calorie counting and at least 150 minutes of physical activity weekly. Avoid sweet beverages and drink more water. Eat at least 6 servings of fruit and vegetables daily   2. Need for Tdap vaccination  - Tdap vaccine greater than or equal to 7yo IM  3. Podagra  - Uric acid  4. Diabetes mellitus type 2 in obese (HCC)  - Hemoglobin A1c  5. Seborrhea  - desonide (DESOWEN) 0.05 % cream; Apply topically 2 (two) times daily.  Dispense: 30 g; Refill: 0  6. Muscle cramps  - cyclobenzaprine (FLEXERIL) 10 MG tablet; Take 1 tablet (10 mg total) by mouth at bedtime.  Dispense: 90 tablet; Refill: 0  7. Chronic midline low back pain with sciatica, sciatica laterality unspecified  - HYDROcodone-acetaminophen (NORCO) 7.5-325 MG tablet; Take 1 tablet by mouth 3 (three) times daily as needed for moderate pain.  Dispense: 100 tablet; Refill: 0 - HYDROcodone-acetaminophen (NORCO) 7.5-325 MG tablet; Take 1 tablet by mouth 3 (three) times daily as needed.  Dispense: 100 tablet; Refill: 0 - HYDROcodone-acetaminophen (NORCO) 7.5-325 MG tablet; Take 1 tablet by mouth 3 (three) times daily as needed for moderate pain.  Dispense: 100 tablet; Refill: 0 - pregabalin (LYRICA) 225 MG capsule; TAKE 1 CAPSULE TWO TIMES A DAY.  Dispense: 180 capsule; Refill: 1  8. Chronic nonmalignant pain  - HYDROcodone-acetaminophen (NORCO) 7.5-325 MG tablet; Take 1 tablet by mouth 3 (three) times daily as needed for moderate pain.  Dispense: 100 tablet;  Refill: 0 - HYDROcodone-acetaminophen (NORCO) 7.5-325 MG tablet; Take 1 tablet by mouth 3 (three) times daily as needed.  Dispense: 100 tablet; Refill: 0 - HYDROcodone-acetaminophen (NORCO) 7.5-325 MG tablet; Take 1 tablet by mouth 3 (three) times daily as needed for moderate pain.  Dispense: 100 tablet; Refill: 0 - pregabalin (LYRICA) 225 MG capsule; TAKE 1 CAPSULE TWO TIMES A DAY.  Dispense: 180 capsule; Refill: 1  9. Acquired hypothyroidism  - SYNTHROID 137 MCG tablet; TAKE 1 TABLET EVERY DAY AND 1 AND ONE-HALF TABLETS ON SUNDAYS  Dispense: 90 tablet; Refill: 1  10. Other insomnia  - traZODone (DESYREL) 100 MG tablet; TAKE ONE TO ONE AND ONE-HALF TABLET BY MOUTH AT BEDTIME.  Dispense: 135 tablet; Refill: 0  11. Tooth abscess  - amoxicillin-clavulanate (AUGMENTIN) 875-125 MG tablet; Take 1 tablet by mouth 2 (two) times daily.  Dispense: 20 tablet; Refill: 0

## 2018-11-18 NOTE — Telephone Encounter (Signed)
  Insurance does not cover Desonide cream: Insurance prefers for patient to try TRIAMCINOLONE Dahlia Byes  ;FOR DISCOUNT,USE [id number],ZJI PAC [phone] FOR EXTENDED DSTR RELIEF

## 2018-11-18 NOTE — Telephone Encounter (Signed)
The other ones too strong for his face, he can use hydrocortisone otc with facial lotion, it will be cheaper

## 2018-11-18 NOTE — Telephone Encounter (Signed)
Informed Mr. [name] of Dr. Ancil Boozer advise on his voicemail since Insurance did not want to cover Desonide cream.

## 2018-11-19 LAB — HEMOGLOBIN A1C
Hgb A1c MFr Bld: 6.4 % of total Hgb — ABNORMAL HIGH (ref <5.7)
Mean Plasma Glucose: 137 (calc)
eAG (mmol/L): 7.6 (calc)

## 2018-11-19 LAB — URIC ACID: Uric Acid, Serum: 6.2 mg/dL (ref 4.0–8.0)

## 2018-11-25 ENCOUNTER — Other Ambulatory Visit: Payer: Self-pay

## 2018-11-25 NOTE — Telephone Encounter (Signed)
Refill request for general medication. Allopurinol to [facility]   Last office visit 11/18/2018   Follow up 02/24/2019

## 2018-11-26 ENCOUNTER — Other Ambulatory Visit: Payer: Self-pay | Admitting: Family Medicine

## 2018-11-26 MED ORDER — ALLOPURINOL 100 MG PO TABS: 100.0000 mg | ORAL_TABLET | Freq: Every day | ORAL | 1 refills | Status: DC

## 2018-11-26 MED ORDER — ALLOPURINOL 100 MG PO TABS: 100.0000 mg | ORAL_TABLET | Freq: Every day | ORAL | 0 refills | Status: DC

## 2018-12-30 ENCOUNTER — Telehealth: Payer: Self-pay | Admitting: Family Medicine

## 2018-12-30 DIAGNOSIS — M5440 Lumbago with sciatica, unspecified side: Principal | ICD-10-CM

## 2018-12-30 DIAGNOSIS — G8929 Other chronic pain: Secondary | ICD-10-CM

## 2018-12-30 NOTE — Telephone Encounter (Signed)
Copied from [place] [id number]. Topic: Quick Communication - Rx Refill/Question >> Dec 30, 2018  4:00 PM Rutherford Nail, [place] wrote: **Previous pharmacy closed down and new pharmacy cannot pull medication. Needing new prescription**   Medication: HYDROcodone-acetaminophen (NORCO) 7.5-325 MG tablet  Has the patient contacted their pharmacy? Yes.  Previous pharmacy closed down and new pharmacy cannot pull medication. Needing new prescription (Agent: If no, request that the patient contact the pharmacy for the refill.) (Agent: If yes, when and what did the pharmacy advise?)  Preferred Pharmacy (with phone number or street name): [id number] [address], [place] AT [facility]   Agent: Please be advised that RX refills may take up to 3 business days. We ask that you follow-up with your pharmacy.

## 2018-12-31 NOTE — Telephone Encounter (Signed)
He had one to be filled on Dec 26th and the third one to be filled on 01/26, when did the pharmacy close? Can you please verify ?

## 2019-01-01 NOTE — Telephone Encounter (Signed)
Called to inquire if the patient received their December medication before the pharmacy closed? Or is he talking about the January Hydrocodone prescription?

## 2019-01-04 ENCOUNTER — Encounter: Payer: Self-pay | Admitting: Family Medicine

## 2019-01-05 ENCOUNTER — Other Ambulatory Visit: Payer: Self-pay | Admitting: Family Medicine

## 2019-01-05 DIAGNOSIS — G8929 Other chronic pain: Secondary | ICD-10-CM

## 2019-01-05 DIAGNOSIS — M5440 Lumbago with sciatica, unspecified side: Principal | ICD-10-CM

## 2019-01-05 MED ORDER — HYDROCODONE-ACETAMINOPHEN 7.5-325 MG PO TABS: 1.0000 | ORAL_TABLET | Freq: Three times a day (TID) | ORAL | 0 refills | Status: DC | PRN

## 2019-01-07 ENCOUNTER — Encounter: Payer: Self-pay | Admitting: Family Medicine

## 2019-02-07 ENCOUNTER — Encounter: Payer: Self-pay | Admitting: Family Medicine

## 2019-02-07 ENCOUNTER — Other Ambulatory Visit: Payer: Self-pay | Admitting: Family Medicine

## 2019-02-07 DIAGNOSIS — E669 Obesity, unspecified: Secondary | ICD-10-CM

## 2019-02-07 DIAGNOSIS — E1169 Type 2 diabetes mellitus with other specified complication: Secondary | ICD-10-CM

## 2019-02-07 DIAGNOSIS — G4709 Other insomnia: Secondary | ICD-10-CM

## 2019-02-07 DIAGNOSIS — R252 Cramp and spasm: Secondary | ICD-10-CM

## 2019-02-08 ENCOUNTER — Other Ambulatory Visit: Payer: Self-pay | Admitting: Family Medicine

## 2019-02-09 ENCOUNTER — Other Ambulatory Visit: Payer: Self-pay | Admitting: Family Medicine

## 2019-02-09 DIAGNOSIS — G8929 Other chronic pain: Secondary | ICD-10-CM

## 2019-02-09 DIAGNOSIS — M5440 Lumbago with sciatica, unspecified side: Principal | ICD-10-CM

## 2019-02-09 MED ORDER — HYDROCODONE-ACETAMINOPHEN 7.5-325 MG PO TABS: 1.0000 | ORAL_TABLET | Freq: Three times a day (TID) | ORAL | 0 refills | Status: DC | PRN

## 2019-02-10 ENCOUNTER — Ambulatory Visit: Payer: Self-pay | Admitting: *Deleted

## 2019-02-10 MED ORDER — LOSARTAN POTASSIUM 50 MG PO TABS: 50.0000 mg | ORAL_TABLET | Freq: Every day | ORAL | 0 refills | Status: DC

## 2019-02-10 MED ORDER — HYDROCHLOROTHIAZIDE 12.5 MG PO CAPS: 12.5000 mg | ORAL_CAPSULE | Freq: Every day | ORAL | 0 refills | Status: DC

## 2019-02-10 NOTE — Addendum Note (Signed)
Addended by: [name] F on: 02/10/2019 04:51 PM   Modules accepted: Orders

## 2019-02-10 NOTE — Addendum Note (Signed)
Addended by: Inda Coke on: 02/10/2019 02:59 PM   Modules accepted: Orders

## 2019-02-10 NOTE — Telephone Encounter (Signed)
[name] with [name] stated that the losartan-hydrochlorothiazide (HYZAAR) 50-12.5 MG tablet is on national backorder. [name] asked if patient can be prescribed losartan-hydrochlorothiazide (HYZAAR) 100-25 MG tablet or if the prescription can be separated. Cb# [phone]  Call History    Type Contact Phone  02/10/2019 01:55 PM Phone (Incoming) [place], [facility] (Pharmacy) [phone]  User: Yvette Rack   Will route to office for final disposition; pt of Dr Ancil Boozer; notified Cassandra  Reason for Disposition . Caller has URGENT medication question about med that PCP prescribed and triager unable to answer question  Protocols used: MEDICATION QUESTION CALL-A-AH

## 2019-02-11 ENCOUNTER — Other Ambulatory Visit: Payer: Self-pay | Admitting: Nurse Practitioner

## 2019-02-11 DIAGNOSIS — M1712 Unilateral primary osteoarthritis, left knee: Secondary | ICD-10-CM

## 2019-02-24 ENCOUNTER — Encounter: Payer: Self-pay | Admitting: Family Medicine

## 2019-02-24 ENCOUNTER — Ambulatory Visit (INDEPENDENT_AMBULATORY_CARE_PROVIDER_SITE_OTHER): Payer: No Typology Code available for payment source | Admitting: Family Medicine

## 2019-02-24 VITALS — BP 112/84 | HR 88 | Temp 98.5°F | Resp 16 | Ht 69.0 in | Wt 319.1 lb

## 2019-02-24 DIAGNOSIS — E669 Obesity, unspecified: Secondary | ICD-10-CM

## 2019-02-24 DIAGNOSIS — Z9884 Bariatric surgery status: Secondary | ICD-10-CM

## 2019-02-24 DIAGNOSIS — R252 Cramp and spasm: Secondary | ICD-10-CM

## 2019-02-24 DIAGNOSIS — M5440 Lumbago with sciatica, unspecified side: Secondary | ICD-10-CM

## 2019-02-24 DIAGNOSIS — I10 Essential (primary) hypertension: Secondary | ICD-10-CM

## 2019-02-24 DIAGNOSIS — E559 Vitamin D deficiency, unspecified: Secondary | ICD-10-CM

## 2019-02-24 DIAGNOSIS — E039 Hypothyroidism, unspecified: Secondary | ICD-10-CM

## 2019-02-24 DIAGNOSIS — E785 Hyperlipidemia, unspecified: Secondary | ICD-10-CM

## 2019-02-24 DIAGNOSIS — E1169 Type 2 diabetes mellitus with other specified complication: Secondary | ICD-10-CM | POA: Diagnosis not present

## 2019-02-24 DIAGNOSIS — M1712 Unilateral primary osteoarthritis, left knee: Secondary | ICD-10-CM

## 2019-02-24 DIAGNOSIS — G8929 Other chronic pain: Secondary | ICD-10-CM

## 2019-02-24 DIAGNOSIS — E538 Deficiency of other specified B group vitamins: Secondary | ICD-10-CM

## 2019-02-24 LAB — POCT GLYCOSYLATED HEMOGLOBIN (HGB A1C): Hemoglobin A1C: 5.4 % (ref 4.0–5.6)

## 2019-02-24 MED ORDER — HYDROCODONE-ACETAMINOPHEN 7.5-325 MG PO TABS: 1.0000 | ORAL_TABLET | Freq: Three times a day (TID) | ORAL | 0 refills | Status: DC | PRN

## 2019-02-24 MED ORDER — DICLOFENAC SODIUM 1 % TD GEL: 4.0000 g | Freq: Four times a day (QID) | TRANSDERMAL | 1 refills | Status: DC

## 2019-02-24 MED ORDER — CYCLOBENZAPRINE HCL 10 MG PO TABS: 10.0000 mg | ORAL_TABLET | Freq: Every day | ORAL | 1 refills | Status: DC

## 2019-02-24 MED ORDER — VITAMIN D (ERGOCALCIFEROL) 1.25 MG (50000 UNIT) PO CAPS: ORAL_CAPSULE | ORAL | 1 refills | Status: DC

## 2019-02-24 NOTE — Progress Notes (Signed)
Name: [name]   MRN: [id number]    DOB: June 06, 1971   Date:02/24/2019       Progress Note  Subjective  Chief Complaint  Chief Complaint  Patient presents with  . Diabetes  . Hypertension  . Hypothyroidism  . Pain    Chronic back pain    HPI  DMII: diet controlled since bariatric surgery 09/2012 ,but hgbA1C is gradually increasingbut has been stable over the pastyear at 6.4%  Denies polyphagia, polydipsia or polyuria Eye exam up to date. They are grilled at home, avoiding eating out, avoiding sweets. He has physical job. Not currently on medication  Muscle Cramps: he states symptoms have improved with B12, D and calcium supplementation but still has cramps sometimes at night. He has been drinking 4-5 bottles of water daily.   Hypothyroidism: taking same dose of Synthroid for many months, last TSH at goal. He has chronic dry skin, no hair lossorconstipation.weight has been stable with only a few pounds of weight loss.   Chronic low back pain : he is taking Norco 7.5/325mg  about three times per day,he used to get 100 pills during the winter months, and 75 during warmer months, medication has been lasting until follow up visits. Taking Lyricaand it seems to help with pain. 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 Pain contract re-singed03/2020 , drug screen normal 04/2018. Pain level right now is 5/10 on lower back and both legs  Neuropathy: he has bilateral daily pain on both legs, secondary to chronic back problems, aching like, he denies burning or prickly sensation, Lyrica helps.Current pain of5/10. No side effects of medication   History of bariatric surgery: surgery 09/2012 was  he is morbidly obesehis weight at the time was 396 lbs- lowest weight after surgery was 275 lbs,he gradually gained weight back and is currently at 319.1 lbs, seems to be stable now He is now packing his lunch instead of eating out. He is drinking more water now. His job is very active.   HTN:bp is at goal, no chest pain or palpitation. No dizziness  Patient Active Problem List   Diagnosis Date Noted  . Chronic midline low back pain with sciatica 02/11/2017  . Anemia, unspecified 06/12/2016  . B12 deficiency 06/11/2016  . Allergic conjunctivitis 06/11/2016  . Hypocalcemia 06/11/2016  . Muscle cramps 06/11/2016  . Hypertension, benign 11/03/2015  . Dyslipidemia 11/02/2015  . Acquired nystagmus 08/01/2015  . Chronic back pain 06/29/2015  . Carpal tunnel syndrome 06/29/2015  . Osteoarthritis 06/29/2015  . Claustrophobia 06/29/2015  . Foot drop, left 06/29/2015  . Hiatal hernia 06/29/2015  . H/O diabetes mellitus 06/29/2015  . H/O testicular cancer 06/29/2015  . Acquired hypothyroidism 06/29/2015  . Dysmetabolic syndrome 53/74/8270  . Morbid obesity ([place]) 06/29/2015  . Allergic rhinitis 06/29/2015  . Neuropathy 06/29/2015  . Seborrheic keratoses 06/29/2015  . Vitamin D deficiency 06/29/2015  . Bariatric surgery status 02/20/2012    Past Surgical History:  Procedure Laterality Date  . BACK SURGERY     X 2  . HERNIA REPAIR     left side and umbilical hernia repair  . 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 URETEROTOMY  Left 2000    Family History  Problem Relation Age of Onset  . Coronary artery disease Mother   . Cancer Father        Melanoma and Testicular  . Diabetes Father     Social History   Socioeconomic History  . Marital status: Married    Spouse name: Not on file  . Number of children: 2  . Years of education: Not on file  . Highest education level: Some college, no degree  Occupational History  . Occupation: grave digger     Comment: [place]  . Financial resource strain: Not very hard  . Food insecurity:    Worry: Never true    Inability: Never true  . Transportation needs:    Medical: No    Non-medical: No  Tobacco Use  . Smoking status: Former Smoker    Packs/day: 3.00    Years: 8.00    Pack years: 24.00    Types: Cigarettes, Cigars, Pipe    Start date: 06/28/1984    Last attempt to quit: 12/24/1991    Years since quitting: 27.1  . Smokeless tobacco: Former Systems developer    Types: [place] date: 12/24/1991  Substance and Sexual Activity  . Alcohol use: No    Alcohol/week: 0.0 standard drinks  . Drug use: No  . Sexual activity: Yes    Partners: Female  Lifestyle  . Physical activity:    Days per week: 0 days    Minutes per session: 0 min  . Stress: Not at all  Relationships  . Social connections:    Talks on phone: Not on file    Gets together: Not on file    Attends religious service: Not on file    Active member of club or organization: Not on file    Attends meetings of clubs or organizations: Not on file    Relationship status: Not on file  . Intimate partner violence:    Fear of current or ex partner: No    Emotionally abused: No    Physically abused: No    Forced sexual activity: No  Other Topics Concern  . Not on file  Social History Narrative   Married, lives with wife, both children and grandchildren     Current Outpatient Medications:  .  allopurinol (ZYLOPRIM) 100 MG tablet, Take 1 tablet (100 mg total) by mouth daily., Disp: 90 tablet, Rfl: 1 .  atorvastatin (LIPITOR) 10 MG tablet, TAKE 1 TABLET BY MOUTH EVERY DAY, Disp: 90 tablet, Rfl: 1 .  Calcium Carbonate-Vit D-Min (CALTRATE 600+D PLUS MINERALS) 600-800 MG-UNIT CHEW, Chew 1 tablet by mouth daily., Disp: 180 tablet, Rfl: 1 .  Cobalamine Combinations (B-12) [phone] MCG SUBL, , Disp: , Rfl: 99 .  Cyanocobalamin (VITAMIN B-12) 500 MCG SUBL, Place 1 tablet (500 mcg total) under the tongue daily., Disp: 90 tablet,  Rfl: 3 .  cyclobenzaprine (FLEXERIL) 10 MG tablet, TAKE 1 TABLET(10 MG TOTAL) BY MOUTH AT BEDTIME, Disp: 90 tablet, Rfl: 0 .  diclofenac sodium (VOLTAREN) 1 % GEL, APPLY 4 GRAMS TOPICALLY 4(FOUR) TIMES DAILY., Disp: 100 g, Rfl: 1 .  ferrous sulfate 325 (65 FE) MG tablet, Take 1 tablet (325 mg total) by mouth daily with breakfast., Disp: 90 tablet, Rfl: 3 .  fexofenadine (ALLEGRA ALLERGY) 180 MG tablet, Take 1 tablet (180 mg total) by mouth daily., Disp: 90 tablet, Rfl: 3 .  Homeopathic Products ([employer] LEG CRAMPS PAIN RELIEF PO),  Take 2 tablets by mouth 2 (two) times daily at 10 am and 4 pm., Disp: , Rfl:  .  hydrochlorothiazide (MICROZIDE) 12.5 MG capsule, Take 1 capsule (12.5 mg total) by mouth daily., Disp: 90 capsule, Rfl: 0 .  HYDROcodone-acetaminophen (NORCO) 7.5-325 MG tablet, Take 1 tablet by mouth 3 (three) times daily as needed., Disp: 100 tablet, Rfl: 0 .  HYDROcodone-acetaminophen (NORCO) 7.5-325 MG tablet, Take 1 tablet by mouth 3 (three) times daily as needed for moderate pain., Disp: 100 tablet, Rfl: 0 .  HYDROcodone-acetaminophen (NORCO) 7.5-325 MG tablet, Take 1 tablet by mouth 3 (three) times daily as needed for moderate pain., Disp: 100 tablet, Rfl: 0 .  ketoconazole (NIZORAL) 2 % cream, Apply 1 application topically daily., Disp: 60 g, Rfl: 1 .  losartan (COZAAR) 50 MG tablet, Take 1 tablet (50 mg total) by mouth daily., Disp: 90 tablet, Rfl: 0 .  Magnesium Oxide 500 MG TABS, TAKE AS DIRECTED, Disp: 200 tablet, Rfl: 0 .  Melatonin-Pyridoxine (MELATONEX PO), Take by mouth., Disp: , Rfl:  .  multivitamin-iron-minerals-folic acid (CENTRUM) chewable tablet, Chew 1 tablet by mouth daily., Disp: 90 tablet, Rfl: 3 .  Potassium 99 MG TABS, Take by mouth., Disp: , Rfl:  .  pregabalin (LYRICA) 225 MG capsule, TAKE 1 CAPSULE TWO TIMES A DAY., Disp: 180 capsule, Rfl: 1 .  SYNTHROID 137 MCG tablet, TAKE 1 TABLET EVERY DAY AND 1 AND ONE-HALF TABLETS ON SUNDAYS, Disp: 90 tablet, Rfl: 1 .   traZODone (DESYREL) 100 MG tablet, TAKE ONE TO ONE AND ONE-HALF TABLET BY MOUTH AT BEDTIME., Disp: 135 tablet, Rfl: 0 .  Vitamin D, Ergocalciferol, (DRISDOL) 50000 units CAPS capsule, TAKE 1 CAPSULE BY MOUTH EVERY 7 DAYS, Disp: 4 capsule, Rfl: 0  Allergies  Allergen Reactions  . Morphine Itching    I personally reviewed active problem list, medication list, allergies, family history, social history with the patient/caregiver today.   ROS  Constitutional: Negative for fever or weight change.  Respiratory: Negative for cough and shortness of breath.   Cardiovascular: Negative for chest pain or palpitations.  Gastrointestinal: Negative for abdominal pain, no bowel changes.  Musculoskeletal: Negative for gait problem or joint swelling.  Skin: Negative for rash.  Neurological: Negative for dizziness or headache.  No other specific complaints in a complete review of systems (except as listed in HPI above).  Objective  Vitals:   02/24/19 0801  BP: 112/84  Pulse: 88  Resp: 16  Temp: 98.5 F (36.9 C)  TempSrc: Oral  SpO2: 98%  Weight: (!) 319 lb 1.6 oz (144.7 kg)  Height: 5\' 9"  (1.753 m)    Body mass index is 47.12 kg/m.  Physical Exam  Constitutional: Patient appears well-developed and well-nourished. Obese  No distress.  HEENT: head atraumatic, normocephalic, pupils equal and reactive to light,  neck supple, throat within normal limits Cardiovascular: Normal rate, regular rhythm and normal heart sounds.  No murmur heard. No BLE edema. Pulmonary/Chest: Effort normal and breath sounds normal. No respiratory distress. Abdominal: Soft.  There is no tenderness. Muscular Skeletal: pain during palpation of lumbar spine, negative straight leg raise crepitus with extension of both knees  Psychiatric: Patient has a normal mood and affect. behavior is normal. Judgment and thought content normal.  PHQ2/9: Depression screen [facility] 2/9 02/24/2019 08/21/2018 05/21/2018 08/15/2017 05/14/2017   Decreased Interest 0 0 0 0 0  Down, Depressed, Hopeless 0 0 0 0 0  PHQ - 2 Score 0 0 0 0 0  Altered sleeping  0 - 0 - -  Tired, decreased energy 0 - 1 - -  Change in appetite 0 - 0 - -  Feeling bad or failure about yourself  0 - 0 - -  Trouble concentrating 0 - 0 - -  Moving slowly or fidgety/restless 0 - 0 - -  Suicidal thoughts 0 - 0 - -  PHQ-9 Score 0 - 1 - -  Difficult doing work/chores - - Not difficult at all - -     Fall Risk: Fall Risk  02/24/2019 11/18/2018 10/08/2018 08/21/2018 05/21/2018  Falls in the past year? 0 0 No No No  Number falls in past yr: 0 - - - -  Injury with Fall? 0 - - - -    Assessment & Plan  1. Diabetes mellitus type 2 in obese (HCC)  - POCT HgB A1C - Microalbumin / creatinine urine ratio  2. Morbid obesity ([place])  Discussed with the patient the risk posed by an increased BMI. Discussed importance of portion control, calorie counting and at least 150 minutes of physical activity weekly. Avoid sweet beverages and drink more water. Eat at least 6 servings of fruit and vegetables daily    3. Chronic midline low back pain with sciatica, sciatica laterality unspecified  - HYDROcodone-acetaminophen (NORCO) 7.5-325 MG tablet; Take 1 tablet by mouth 3 (three) times daily as needed.  Dispense: 100 tablet; Refill: 0 - HYDROcodone-acetaminophen (NORCO) 7.5-325 MG tablet; Take 1 tablet by mouth 3 (three) times daily as needed for moderate pain.  Dispense: 100 tablet; Refill: 0 - HYDROcodone-acetaminophen (NORCO) 7.5-325 MG tablet; Take 1 tablet by mouth 3 (three) times daily as needed for moderate pain.  Dispense: 100 tablet; Refill: 0  4. Vitamin D deficiency  - VITAMIN D 25 Hydroxy (Vit-D Deficiency, Fractures) - Vitamin D, Ergocalciferol, (DRISDOL) 1.25 MG (50000 UT) CAPS capsule; TAKE 1 CAPSULE BY MOUTH EVERY 7 DAYS  Dispense: 12 capsule; Refill: 1  5. Hypertension, benign  - COMPLETE METABOLIC PANEL WITH GFR  6. Dyslipidemia  - Lipid panel  7.  Acquired hypothyroidism  - TSH  8. Bariatric surgery status   9. B12 deficiency  - B12 and Folate Panel  10. Muscle cramps  - cyclobenzaprine (FLEXERIL) 10 MG tablet; Take 1 tablet (10 mg total) by mouth at bedtime.  Dispense: 90 tablet; Refill: 1  11. Chronic nonmalignant pain  - HYDROcodone-acetaminophen (NORCO) 7.5-325 MG tablet; Take 1 tablet by mouth 3 (three) times daily as needed.  Dispense: 100 tablet; Refill: 0 - HYDROcodone-acetaminophen (NORCO) 7.5-325 MG tablet; Take 1 tablet by mouth 3 (three) times daily as needed for moderate pain.  Dispense: 100 tablet; Refill: 0 - HYDROcodone-acetaminophen (NORCO) 7.5-325 MG tablet; Take 1 tablet by mouth 3 (three) times daily as needed for moderate pain.  Dispense: 100 tablet; Refill: 0  12. Primary osteoarthritis of left knee  - diclofenac sodium (VOLTAREN) 1 % GEL; Apply 4 g topically 4 (four) times daily. Knee, DX OA  Dispense: 100 g; Refill: 1

## 2019-02-25 LAB — COMPLETE METABOLIC PANEL WITH GFR
AG Ratio: 2 (calc) (ref 1.0–2.5)
ALT: 14 U/L (ref 9–46)
AST: 20 U/L (ref 10–40)
Albumin: 4.2 g/dL (ref 3.6–5.1)
Alkaline phosphatase (APISO): 106 U/L (ref 36–130)
BUN: 14 mg/dL (ref 7–25)
CO2: 33 mmol/L — ABNORMAL HIGH (ref 20–32)
Calcium: 9 mg/dL (ref 8.6–10.3)
Chloride: 104 mmol/L (ref 98–110)
Creat: 1.32 mg/dL (ref 0.60–1.35)
GFR, Est African American: 74 mL/min/{1.73_m2} (ref >=60)
GFR, Est Non African American: 64 mL/min/{1.73_m2} (ref >=60)
Globulin: 2.1 g/dL (calc) (ref 1.9–3.7)
Glucose, Bld: 97 mg/dL (ref 65–99)
Potassium: 4.7 mmol/L (ref 3.5–5.3)
Sodium: 142 mmol/L (ref 135–146)
Total Bilirubin: 0.6 mg/dL (ref 0.2–1.2)
Total Protein: 6.3 g/dL (ref 6.1–8.1)

## 2019-02-25 LAB — LIPID PANEL
Cholesterol: 91 mg/dL (ref <200)
HDL: 32 mg/dL — ABNORMAL LOW (ref >=40)
LDL Cholesterol (Calc): 39 mg/dL (calc)
Non-HDL Cholesterol (Calc): 59 mg/dL (calc) (ref <130)
Total CHOL/HDL Ratio: 2.8 (calc) (ref <5.0)
Triglycerides: 110 mg/dL (ref <150)

## 2019-02-25 LAB — MICROALBUMIN / CREATININE URINE RATIO
Creatinine, Urine: 108 mg/dL (ref 20–320)
Microalb Creat Ratio: 4 mcg/mg creat (ref <30)
Microalb, Ur: 0.4 mg/dL

## 2019-02-25 LAB — VITAMIN D 25 HYDROXY (VIT D DEFICIENCY, FRACTURES): Vit D, 25-Hydroxy: 43 ng/mL (ref 30–100)

## 2019-02-25 LAB — B12 AND FOLATE PANEL
Folate: 10.6 ng/mL
Vitamin B-12: >2000 pg/mL — ABNORMAL HIGH (ref 200–1100)

## 2019-02-25 LAB — TSH: TSH: 1.07 mIU/L (ref 0.40–4.50)

## 2019-03-08 ENCOUNTER — Telehealth: Payer: Self-pay

## 2019-03-08 NOTE — Telephone Encounter (Signed)
He can take otc vitamin d 2000 units daily

## 2019-03-08 NOTE — Telephone Encounter (Signed)
[facility] fax [place] that Vitamin D 50,000 IU is not covered by patient plan. Please advise.

## 2019-03-09 NOTE — Telephone Encounter (Signed)
Spoke with patient wife [name] and informed her since vitamin d 50,000 units is not covered. Patient can take Vitamin D 2000 otc for low Vitamin D levels.

## 2019-05-04 ENCOUNTER — Other Ambulatory Visit: Payer: Self-pay | Admitting: Family Medicine

## 2019-05-04 DIAGNOSIS — E039 Hypothyroidism, unspecified: Secondary | ICD-10-CM

## 2019-05-05 ENCOUNTER — Other Ambulatory Visit: Payer: Self-pay | Admitting: Family Medicine

## 2019-05-05 DIAGNOSIS — E039 Hypothyroidism, unspecified: Secondary | ICD-10-CM

## 2019-05-05 MED ORDER — SYNTHROID 137 MCG PO TABS: ORAL_TABLET | ORAL | 1 refills | Status: DC

## 2019-05-05 NOTE — Telephone Encounter (Signed)
Refill request for thyroid medication: Synthroid 137 mcg   Lab Results  Component Value Date   TSH 1.07 02/24/2019    Follow-ups on file. 06/18/2019

## 2019-05-07 ENCOUNTER — Other Ambulatory Visit: Payer: Self-pay | Admitting: Family Medicine

## 2019-05-07 DIAGNOSIS — G4709 Other insomnia: Secondary | ICD-10-CM

## 2019-05-12 ENCOUNTER — Other Ambulatory Visit: Payer: Self-pay | Admitting: Family Medicine

## 2019-05-12 DIAGNOSIS — M1712 Unilateral primary osteoarthritis, left knee: Secondary | ICD-10-CM

## 2019-05-12 NOTE — Telephone Encounter (Signed)
Refill request for general medication. Voltaren Gel   Last office visit 02/24/2019   Follow up on 06/18/2019

## 2019-05-18 ENCOUNTER — Other Ambulatory Visit: Payer: Self-pay | Admitting: Family Medicine

## 2019-05-18 DIAGNOSIS — G8929 Other chronic pain: Secondary | ICD-10-CM

## 2019-05-24 ENCOUNTER — Other Ambulatory Visit: Payer: Self-pay | Admitting: Family Medicine

## 2019-05-24 DIAGNOSIS — M1712 Unilateral primary osteoarthritis, left knee: Secondary | ICD-10-CM

## 2019-06-18 ENCOUNTER — Ambulatory Visit (INDEPENDENT_AMBULATORY_CARE_PROVIDER_SITE_OTHER): Payer: No Typology Code available for payment source | Admitting: Family Medicine

## 2019-06-18 ENCOUNTER — Other Ambulatory Visit: Payer: Self-pay

## 2019-06-18 ENCOUNTER — Encounter: Payer: Self-pay | Admitting: Family Medicine

## 2019-06-18 DIAGNOSIS — M5442 Lumbago with sciatica, left side: Secondary | ICD-10-CM

## 2019-06-18 DIAGNOSIS — M5441 Lumbago with sciatica, right side: Secondary | ICD-10-CM | POA: Diagnosis not present

## 2019-06-18 DIAGNOSIS — G8929 Other chronic pain: Secondary | ICD-10-CM

## 2019-06-18 MED ORDER — HYDROCODONE-ACETAMINOPHEN 7.5-325 MG PO TABS: 1.0000 | ORAL_TABLET | Freq: Three times a day (TID) | ORAL | 0 refills | Status: DC | PRN

## 2019-06-18 NOTE — Progress Notes (Signed)
Name: [name]   MRN: [id number]    DOB: May 21, 1971   Date:06/18/2019       Progress Note  Subjective  Chief Complaint  Chief Complaint  Patient presents with  . Diabetes  . Obesity  . Hypertension    I connected with  [name]  on 06/18/19 at  3:40 PM EDT by a video enabled telemedicine application and verified that I am speaking with the correct person using two identifiers.  I discussed the limitations of evaluation and management by telemedicine and the availability of in person appointments. The patient expressed understanding and agreed to proceed. Staff also discussed with the patient that there may be a patient responsible charge related to this service. Patient Location: in his car, at work - parked Provider Location: [place]   HPI  Chronic low back pain : he is taking Norco 7.5/325mg  about three times per day,he used to get 100 pills during the winter months, and 75 during warmer months, we will 80 for this round and see how he can tolerate the pain . Taking Lyricaand it seems to help with pain. No longer under disability , working full time and has a physical job Office manager, he is out in the elements and cold makes pain worse, he stopped good powders , none in the past month . Pain contract re-singed03/2020 , drug screen will be done next vsiit  Pain level right now is 5/10 on lower back and both legs. No bowel or bladder incontinence   Patient Active Problem List   Diagnosis Date Noted  . Chronic midline low back pain with sciatica 02/11/2017  . Anemia, unspecified 06/12/2016  . B12 deficiency 06/11/2016  . Allergic conjunctivitis 06/11/2016  . Hypocalcemia 06/11/2016  . Muscle cramps 06/11/2016  . Hypertension, benign 11/03/2015  . Dyslipidemia 11/02/2015  . Acquired nystagmus 08/01/2015  . Chronic back pain 06/29/2015  . Carpal tunnel syndrome 06/29/2015  . Osteoarthritis 06/29/2015  . Claustrophobia 06/29/2015  . Foot  drop, left 06/29/2015  . Hiatal hernia 06/29/2015  . H/O diabetes mellitus 06/29/2015  . H/O testicular cancer 06/29/2015  . Acquired hypothyroidism 06/29/2015  . Dysmetabolic syndrome 20/94/7096  . Morbid obesity ([place]) 06/29/2015  . Allergic rhinitis 06/29/2015  . Neuropathy 06/29/2015  . Seborrheic keratoses 06/29/2015  . Vitamin D deficiency 06/29/2015  . Bariatric surgery status 02/20/2012    Past Surgical History:  Procedure Laterality Date  . BACK SURGERY     X 2  . HERNIA REPAIR     left side and umbilical hernia repair  . 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 URETEROTOMY Left 2000    Family History  Problem Relation Age of Onset  . Coronary artery disease Mother   . Cancer Father        Melanoma and Testicular  . Diabetes Father     Social History   Socioeconomic History  . Marital status: Married    Spouse name: Not on file  . Number of children: 2  . Years of education: Not on file  . Highest education level: Some college, no degree  Occupational History  . Occupation: grave digger     Comment: [place]  . Financial resource strain: Not very hard  . Food insecurity    Worry: Never true    Inability: Never true  . Transportation needs    Medical: No    Non-medical: No  Tobacco Use  . Smoking  status: Former Smoker    Packs/day: 3.00    Years: 8.00    Pack years: 24.00    Types: Cigarettes, Cigars, Pipe    Start date: 06/28/1984    Quit date: 12/24/1991    Years since quitting: 27.5  . Smokeless tobacco: Former Systems developer    Types: [place] date: 12/24/1991  Substance and Sexual Activity  . Alcohol use: No    Alcohol/week: 0.0 standard drinks  . Drug use: No  . Sexual activity: Yes    Partners: Female  Lifestyle  . Physical activity    Days per week: 0 days    Minutes per session: 0 min  . Stress: Not at all  Relationships  . Social Herbalist on phone: Not on file    Gets together: Not on file     Attends religious service: Not on file    Active member of club or organization: Not on file    Attends meetings of clubs or organizations: Not on file    Relationship status: Not on file  . Intimate partner violence    Fear of current or ex partner: No    Emotionally abused: No    Physically abused: No    Forced sexual activity: No  Other Topics Concern  . Not on file  Social History Narrative   Married, lives with wife, both children and grandchildren     Current Outpatient Medications:  .  allopurinol (ZYLOPRIM) 100 MG tablet, TAKE 1 TABLET(100 MG TOTAL) BY MOUTH DAILY., Disp: 90 tablet, Rfl: 1 .  atorvastatin (LIPITOR) 10 MG tablet, TAKE 1 TABLET BY MOUTH EVERY DAY, Disp: 90 tablet, Rfl: 1 .  Calcium Carbonate-Vit D-Min (CALTRATE 600+D PLUS MINERALS) 600-800 MG-UNIT CHEW, Chew 1 tablet by mouth daily., Disp: 180 tablet, Rfl: 1 .  Cyanocobalamin (VITAMIN B-12) 500 MCG SUBL, Place 1 tablet (500 mcg total) under the tongue daily., Disp: 90 tablet, Rfl: 3 .  cyclobenzaprine (FLEXERIL) 10 MG tablet, Take 1 tablet (10 mg total) by mouth at bedtime., Disp: 90 tablet, Rfl: 1 .  diclofenac sodium (VOLTAREN) 1 % GEL, APPLY 4 GRAMS TOPICALLY FOUR TIMES DAILY TO KNEE, Disp: 100 g, Rfl: 1 .  ferrous sulfate 325 (65 FE) MG tablet, Take 1 tablet (325 mg total) by mouth daily with breakfast., Disp: 90 tablet, Rfl: 3 .  fexofenadine (ALLEGRA ALLERGY) 180 MG tablet, Take 1 tablet (180 mg total) by mouth daily., Disp: 90 tablet, Rfl: 3 .  Homeopathic Products ([employer] LEG CRAMPS PAIN RELIEF PO), Take 2 tablets by mouth 2 (two) times daily at 10 am and 4 pm., Disp: , Rfl:  .  hydrochlorothiazide (MICROZIDE) 12.5 MG capsule, Take 1 capsule (12.5 mg total) by mouth daily., Disp: 90 capsule, Rfl: 0 .  HYDROcodone-acetaminophen (NORCO) 7.5-325 MG tablet, Take 1 tablet by mouth 3 (three) times daily as needed., Disp: 100 tablet, Rfl: 0 .  HYDROcodone-acetaminophen (NORCO) 7.5-325 MG tablet, Take 1 tablet by mouth  3 (three) times daily as needed for moderate pain., Disp: 100 tablet, Rfl: 0 .  HYDROcodone-acetaminophen (NORCO) 7.5-325 MG tablet, Take 1 tablet by mouth 3 (three) times daily as needed for moderate pain., Disp: 100 tablet, Rfl: 0 .  ketoconazole (NIZORAL) 2 % cream, Apply 1 application topically daily., Disp: 60 g, Rfl: 1 .  losartan (COZAAR) 50 MG tablet, Take 1 tablet (50 mg total) by mouth daily., Disp: 90 tablet, Rfl: 0 .  Magnesium Oxide 500 MG TABS, TAKE  AS DIRECTED, Disp: 200 tablet, Rfl: 0 .  Melatonin-Pyridoxine (MELATONEX PO), Take by mouth., Disp: , Rfl:  .  multivitamin-iron-minerals-folic acid (CENTRUM) chewable tablet, Chew 1 tablet by mouth daily., Disp: 90 tablet, Rfl: 3 .  Potassium 99 MG TABS, Take by mouth., Disp: , Rfl:  .  pregabalin (LYRICA) 225 MG capsule, TAKE 1 CAPSULE BY MOUTH TWICE DAILY, Disp: 180 capsule, Rfl: 1 .  SYNTHROID 137 MCG tablet, TAKE 1 TABLET EVERY DAY AND 1 AND 1/2 TABLETS ON SUNDAYS., Disp: 90 tablet, Rfl: 1 .  traZODone (DESYREL) 100 MG tablet, TAKE 1 TO 1 AND 1/2 TABLETS BY MOUTH AT BEDTIME, Disp: 135 tablet, Rfl: 0 .  Vitamin D, Ergocalciferol, (DRISDOL) 1.25 MG (50000 UT) CAPS capsule, TAKE 1 CAPSULE BY MOUTH EVERY 7 DAYS, Disp: 12 capsule, Rfl: 1  Allergies  Allergen Reactions  . Morphine Itching    I personally reviewed active problem list, medication list, allergies, family history, social history with the patient/caregiver today.   ROS  Ten systems reviewed and is negative except as mentioned in HPI   Objective  Virtual encounter, vitals not obtained.  There is no height or weight on file to calculate BMI.  Physical Exam  Awake, alert and oriented  PHQ2/9: Depression screen [facility] 2/9 06/18/2019 02/24/2019 08/21/2018 05/21/2018 08/15/2017  Decreased Interest 0 0 0 0 0  Down, Depressed, Hopeless 0 0 0 0 0  PHQ - 2 Score 0 0 0 0 0  Altered sleeping 0 0 - 0 -  Tired, decreased energy 0 0 - 1 -  Change in appetite 0 0 - 0 -  Feeling  bad or failure about yourself  0 0 - 0 -  Trouble concentrating 0 0 - 0 -  Moving slowly or fidgety/restless 0 0 - 0 -  Suicidal thoughts 0 0 - 0 -  PHQ-9 Score 0 0 - 1 -  Difficult doing work/chores - - - Not difficult at all -   PHQ-2/9 Result is negative.    Fall Risk: Fall Risk  06/18/2019 02/24/2019 11/18/2018 10/08/2018 08/21/2018  Falls in the past year? 0 0 0 No No  Number falls in past yr: 0 0 - - -  Injury with Fall? 0 0 - - -     Assessment & Plan  1. Chronic midline low back pain with bilateral sciatica  - HYDROcodone-acetaminophen (NORCO) 7.5-325 MG tablet; Take 1 tablet by mouth 3 (three) times daily as needed. Fill August 24th, 2020  Dispense: 100 tablet; Refill: 0 - HYDROcodone-acetaminophen (NORCO) 7.5-325 MG tablet; Take 1 tablet by mouth 3 (three) times daily as needed for moderate pain. Fill July 25 th, 2020  Dispense: 80 tablet; Refill: 0 - HYDROcodone-acetaminophen (NORCO) 7.5-325 MG tablet; Take 1 tablet by mouth 3 (three) times daily as needed for moderate pain.  Dispense: 80 tablet; Refill: 0  2. Chronic nonmalignant pain  - HYDROcodone-acetaminophen (NORCO) 7.5-325 MG tablet; Take 1 tablet by mouth 3 (three) times daily as needed. Fill August 24th, 2020  Dispense: 100 tablet; Refill: 0 - HYDROcodone-acetaminophen (NORCO) 7.5-325 MG tablet; Take 1 tablet by mouth 3 (three) times daily as needed for moderate pain. Fill July 25 th, 2020  Dispense: 80 tablet; Refill: 0 - HYDROcodone-acetaminophen (NORCO) 7.5-325 MG tablet; Take 1 tablet by mouth 3 (three) times daily as needed for moderate pain.  Dispense: 80 tablet; Refill: 0 I discussed the assessment and treatment plan with the patient. The patient was provided an opportunity to ask questions and all  were answered. The patient agreed with the plan and demonstrated an understanding of the instructions.  The patient was advised to call back or seek an in-person evaluation if the symptoms worsen or if the condition  fails to improve as anticipated.  I provided 15  minutes of non-face-to-face time during this encounter.

## 2019-07-15 ENCOUNTER — Other Ambulatory Visit: Payer: Self-pay | Admitting: Family Medicine

## 2019-07-15 DIAGNOSIS — J302 Other seasonal allergic rhinitis: Secondary | ICD-10-CM

## 2019-08-01 ENCOUNTER — Other Ambulatory Visit: Payer: Self-pay | Admitting: Family Medicine

## 2019-08-01 DIAGNOSIS — E1169 Type 2 diabetes mellitus with other specified complication: Secondary | ICD-10-CM

## 2019-08-01 DIAGNOSIS — G4709 Other insomnia: Secondary | ICD-10-CM

## 2019-08-03 ENCOUNTER — Telehealth: Payer: Self-pay

## 2019-08-04 ENCOUNTER — Telehealth: Payer: Self-pay

## 2019-08-04 MED ORDER — MAGNESIUM OXIDE -MG SUPPLEMENT 500 MG PO TABS: 1.0000 | ORAL_TABLET | Freq: Every day | ORAL | 0 refills | Status: DC

## 2019-08-04 NOTE — Telephone Encounter (Signed)
Copied from [place] [id number]. Topic: General - Inquiry >> Aug 03, 2019  9:19 AM Richardo Priest, NT wrote: Reason for CRM: Pharmacy called in stating they are needing clarification for the medication directions for Magnesium Oxide 500 MG TABS. Please advise and call back is [phone].

## 2019-08-24 NOTE — Telephone Encounter (Signed)
Erroneous Encounter

## 2019-08-26 ENCOUNTER — Other Ambulatory Visit: Payer: Self-pay | Admitting: Family Medicine

## 2019-08-26 DIAGNOSIS — E559 Vitamin D deficiency, unspecified: Secondary | ICD-10-CM

## 2019-08-26 NOTE — Telephone Encounter (Signed)
Requested medication (s) are due for refill today: yes   Requested medication (s) are on the active medication list: yes  Last refill:  05/25/19  Future visit scheduled: yes  Notes to clinic:  50,000 IU strengths are not delegated  Requested Prescriptions  Pending Prescriptions Disp Refills   Vitamin D, Ergocalciferol, (DRISDOL) 1.25 MG (50000 UT) CAPS capsule [Pharmacy Med Name: VITAMIN D2 50,000IU (ERGO) CAP RX] 12 capsule 1    Sig: TAKE 1 CAPSULE BY MOUTH EVERY 7 DAYS     Endocrinology:  Vitamins - Vitamin D Supplementation Failed - 08/26/2019 10:23 AM      Failed - 50,000 IU strengths are not delegated      Failed - Phosphate in normal range and within 360 days    No results found for: PHOS       Passed - Ca in normal range and within 360 days    Calcium  Date Value Ref Range Status  02/24/2019 9.0 8.6 - 10.3 mg/dL Final   Calcium, Total  Date Value Ref Range Status  01/11/2015 8.2 (L) 8.5 - 10.1 mg/dL Final         Passed - Vitamin D in normal range and within 360 days    Vit D, 25-Hydroxy  Date Value Ref Range Status  02/24/2019 43 30 - 100 ng/mL Final    Comment:    Vitamin D Status         25-OH Vitamin D: . Deficiency:                    <20 ng/mL Insufficiency:             20 - 29 ng/mL Optimal:                 > or = 30 ng/mL . For 25-OH Vitamin D testing on patients on  D2-supplementation and patients for whom quantitation  of D2 and D3 fractions is required, the QuestAssureD(TM) 25-OH VIT D, (D2,D3), LC/MS/MS is recommended: order  code [id number] (patients >22yrs). . For more information on this test, go to: http://education.questdiagnostics.com/faq/FAQ163 (This link is being provided for  informational/educational purposes only.)          Passed - Valid encounter within last 12 months    Recent Outpatient Visits          2 months ago Chronic midline low back pain with bilateral sciatica   [facility] [name], MD   6 months  ago Diabetes mellitus type 2 in obese [facility]   [facility] [name], MD   9 months ago Morbid obesity [facility])   [facility], Drue Stager, MD   10 months ago Acute gout involving toe of right foot, unspecified cause   [place], [place]   1 year ago Diabetes mellitus type 2 in obese [facility])   [facility] [name], MD      Future Appointments            In 3 weeks Ancil Boozer, Drue Stager, MD [facility], [facility]

## 2019-09-15 ENCOUNTER — Encounter: Payer: Self-pay | Admitting: Family Medicine

## 2019-09-16 ENCOUNTER — Ambulatory Visit (INDEPENDENT_AMBULATORY_CARE_PROVIDER_SITE_OTHER): Payer: No Typology Code available for payment source | Admitting: Family Medicine

## 2019-09-16 ENCOUNTER — Encounter: Payer: Self-pay | Admitting: Family Medicine

## 2019-09-16 ENCOUNTER — Other Ambulatory Visit: Payer: Self-pay

## 2019-09-16 VITALS — BP 118/86 | HR 88 | Temp 97.3°F | Resp 16 | Ht 69.0 in | Wt 315.1 lb

## 2019-09-16 DIAGNOSIS — Z23 Encounter for immunization: Secondary | ICD-10-CM

## 2019-09-16 DIAGNOSIS — M5440 Lumbago with sciatica, unspecified side: Secondary | ICD-10-CM

## 2019-09-16 DIAGNOSIS — E119 Type 2 diabetes mellitus without complications: Secondary | ICD-10-CM

## 2019-09-16 DIAGNOSIS — E1169 Type 2 diabetes mellitus with other specified complication: Secondary | ICD-10-CM

## 2019-09-16 DIAGNOSIS — M5442 Lumbago with sciatica, left side: Secondary | ICD-10-CM

## 2019-09-16 DIAGNOSIS — G8929 Other chronic pain: Secondary | ICD-10-CM

## 2019-09-16 DIAGNOSIS — R252 Cramp and spasm: Secondary | ICD-10-CM | POA: Diagnosis not present

## 2019-09-16 DIAGNOSIS — E669 Obesity, unspecified: Secondary | ICD-10-CM | POA: Diagnosis not present

## 2019-09-16 DIAGNOSIS — E785 Hyperlipidemia, unspecified: Secondary | ICD-10-CM

## 2019-09-16 DIAGNOSIS — Z9884 Bariatric surgery status: Secondary | ICD-10-CM

## 2019-09-16 DIAGNOSIS — E538 Deficiency of other specified B group vitamins: Secondary | ICD-10-CM

## 2019-09-16 DIAGNOSIS — G4709 Other insomnia: Secondary | ICD-10-CM

## 2019-09-16 DIAGNOSIS — E039 Hypothyroidism, unspecified: Secondary | ICD-10-CM

## 2019-09-16 DIAGNOSIS — M5441 Lumbago with sciatica, right side: Secondary | ICD-10-CM

## 2019-09-16 DIAGNOSIS — M109 Gout, unspecified: Secondary | ICD-10-CM

## 2019-09-16 DIAGNOSIS — L309 Dermatitis, unspecified: Secondary | ICD-10-CM

## 2019-09-16 DIAGNOSIS — E559 Vitamin D deficiency, unspecified: Secondary | ICD-10-CM

## 2019-09-16 LAB — POCT GLYCOSYLATED HEMOGLOBIN (HGB A1C): HbA1c, POC (controlled diabetic range): 6.2 % (ref 0.0–7.0)

## 2019-09-16 MED ORDER — LOSARTAN POTASSIUM 25 MG PO TABS: 25.0000 mg | ORAL_TABLET | Freq: Every day | ORAL | 1 refills | Status: DC

## 2019-09-16 MED ORDER — ALLOPURINOL 100 MG PO TABS: 100.0000 mg | ORAL_TABLET | Freq: Every day | ORAL | 1 refills | Status: DC

## 2019-09-16 MED ORDER — HYDROCODONE-ACETAMINOPHEN 7.5-325 MG PO TABS: 1.0000 | ORAL_TABLET | Freq: Three times a day (TID) | ORAL | 0 refills | Status: DC | PRN

## 2019-09-16 MED ORDER — CYCLOBENZAPRINE HCL 10 MG PO TABS: 10.0000 mg | ORAL_TABLET | Freq: Every day | ORAL | 1 refills | Status: DC

## 2019-09-16 MED ORDER — TRAZODONE HCL 100 MG PO TABS: 150.0000 mg | ORAL_TABLET | Freq: Every day | ORAL | 0 refills | Status: DC

## 2019-09-16 MED ORDER — AMMONIUM LACTATE 12 % EX CREA: TOPICAL_CREAM | CUTANEOUS | 0 refills | Status: DC | PRN

## 2019-09-16 MED ORDER — PREGABALIN 225 MG PO CAPS: ORAL_CAPSULE | ORAL | 1 refills | Status: DC

## 2019-09-16 NOTE — Addendum Note (Signed)
Addended by: Inda Coke on: 09/16/2019 12:25 PM   Modules accepted: Orders

## 2019-09-16 NOTE — Progress Notes (Signed)
Name: [name]   MRN: [id number]    DOB: 1971/07/21   Date:09/16/2019       Progress Note  Subjective  Chief Complaint  Chief Complaint  Patient presents with  . Diabetes    3 Month Follow Up /Urine Drug Screen    HPI  DMII: diet controlled since bariatric surgery 09/2012 ,but hgbA1C is gradually increasingbut has been stable over the pastyear , today is 6.2% Denies polyphagia, polydipsia or polyuriaEye exam up to date. They are grilled at home, avoiding eating out, avoiding sweets. He has physical job, Office manager. Not currently on medication, he has been off losartan and we will resume at lower dose today   Muscle Cramps: he states symptoms have improved with B12, D and calcium supplementationbut still has cramps sometimes at night. He has been drinking 4-5 bottles of water daily. Discussed trying magnesium oxide otc   Hypothyroidism: taking same dose of Synthroid for many months, last TSH at goal. He has chronic dry skin, no hair lossorconstipation.weight has been stable with only a few pounds of weight loss. He states hands are very dry and cracked we will send rx.   Chronic low back pain : he is taking Norco 7.5/325mg  about three times per day,he used to get 100 pills during the winter months, and 75 during warmer months, since it is getting cold we will change to 80 this month, 90 next month and 100 after that  Taking Lyricaand it seems to help with pain. No longer under disability , working full time and has a physical job Office manager, he is out in the elements and cold makes pain worse, he stopped taking goody-powders  Pain contract re-singed03/2020 , drug screen normal 04/2018, we will recheck today . Pain level right now is 5/10 on lower back and both legs  Neuropathy: he has bilateral daily pain on both legs, secondary to chronic back problems, aching like, he denies burning or prickly sensation, Lyrica helps.Current pain of5/10. Unchanged   History  of bariatric surgery: surgery 09/2012 was he is morbidly obesehis weight at the time was 396 lbs- lowest weight after surgery was 275 lbs,he gradually gained weight back and is currently at 315  lbs, seems to be stable now He is now packing his lunch instead of eating out. He is drinking more water now. His job is very active.   HTN:he has been off bp medication and bp seems low, we will resume at 25 of losartan to make sure kidney is protected   Patient Active Problem List   Diagnosis Date Noted  . Chronic midline low back pain with sciatica 02/11/2017  . Anemia, unspecified 06/12/2016  . B12 deficiency 06/11/2016  . Allergic conjunctivitis 06/11/2016  . Hypocalcemia 06/11/2016  . Muscle cramps 06/11/2016  . Hypertension, benign 11/03/2015  . Dyslipidemia 11/02/2015  . Acquired nystagmus 08/01/2015  . Chronic back pain 06/29/2015  . Carpal tunnel syndrome 06/29/2015  . Osteoarthritis 06/29/2015  . Claustrophobia 06/29/2015  . Foot drop, left 06/29/2015  . Hiatal hernia 06/29/2015  . H/O diabetes mellitus 06/29/2015  . H/O testicular cancer 06/29/2015  . Acquired hypothyroidism 06/29/2015  . Dysmetabolic syndrome [id number]  . Morbid obesity ([place]) 06/29/2015  . Allergic rhinitis 06/29/2015  . Neuropathy 06/29/2015  . Seborrheic keratoses 06/29/2015  . Vitamin D deficiency 06/29/2015  . Bariatric surgery status 02/20/2012    Past Surgical History:  Procedure Laterality Date  . BACK SURGERY     X 2  . HERNIA REPAIR  left side and umbilical hernia repair  . 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 URETEROTOMY Left 2000    Family History  Problem Relation Age of Onset  . Coronary artery disease Mother   . Cancer Father        Melanoma and Testicular  . Diabetes Father     Social History   Socioeconomic History  . Marital status: Married    Spouse name: Not on file  . Number of children: 2  . Years of education: Not on file  . Highest education level: Some  college, no degree  Occupational History  . Occupation: grave digger     Comment: [place]  . Financial resource strain: Not very hard  . Food insecurity    Worry: Never true    Inability: Never true  . Transportation needs    Medical: No    Non-medical: No  Tobacco Use  . Smoking status: Former Smoker    Packs/day: 3.00    Years: 8.00    Pack years: 24.00    Types: Cigarettes, Cigars, Pipe    Start date: 06/28/1984    Quit date: 12/24/1991    Years since quitting: 27.7  . Smokeless tobacco: Former Systems developer    Types: [place] date: 12/24/1991  Substance and Sexual Activity  . Alcohol use: No    Alcohol/week: 0.0 standard drinks  . Drug use: No  . Sexual activity: Yes    Partners: Female  Lifestyle  . Physical activity    Days per week: 7 days    Minutes per session: 120 min  . Stress: Not at all  Relationships  . Social Herbalist on phone: Twice a week    Gets together: Never    Attends religious service: More than 4 times per year    Active member of club or organization: Yes    Attends meetings of clubs or organizations: More than 4 times per year    Relationship status: Married  . Intimate partner violence    Fear of current or ex partner: No    Emotionally abused: No    Physically abused: No    Forced sexual activity: No  Other Topics Concern  . Not on file  Social History Narrative   Married, lives with wife, both children and grandchildren     Current Outpatient Medications:  .  allopurinol (ZYLOPRIM) 100 MG tablet, TAKE 1 TABLET(100 MG TOTAL) BY MOUTH DAILY., Disp: 90 tablet, Rfl: 1 .  atorvastatin (LIPITOR) 10 MG tablet, TAKE 1 TABLET BY MOUTH EVERY DAY, Disp: 90 tablet, Rfl: 1 .  Calcium Carbonate-Vit D-Min (CALTRATE 600+D PLUS MINERALS) 600-800 MG-UNIT CHEW, Chew 1 tablet by mouth daily., Disp: 180 tablet, Rfl: 1 .  Cyanocobalamin (VITAMIN B-12) 500 MCG SUBL, Place 1 tablet (500 mcg total) under the tongue daily.,  Disp: 90 tablet, Rfl: 3 .  cyclobenzaprine (FLEXERIL) 10 MG tablet, Take 1 tablet (10 mg total) by mouth at bedtime., Disp: 90 tablet, Rfl: 1 .  diclofenac sodium (VOLTAREN) 1 % GEL, APPLY 4 GRAMS TOPICALLY FOUR TIMES DAILY TO KNEE, Disp: 100 g, Rfl: 1 .  ferrous sulfate 325 (65 FE) MG tablet, Take 1 tablet (325 mg total) by mouth daily with breakfast., Disp: 90 tablet, Rfl: 3 .  fexofenadine (ALLEGRA) 180 MG tablet, TAKE 1 TABLET(180 MG TOTAL) BY MOUTH DAILY, Disp: 90 tablet, Rfl: 3 .  Homeopathic Products ([employer] LEG CRAMPS PAIN RELIEF  PO), Take 2 tablets by mouth 2 (two) times daily at 10 am and 4 pm., Disp: , Rfl:  .  HYDROcodone-acetaminophen (NORCO) 7.5-325 MG tablet, Take 1 tablet by mouth 3 (three) times daily as needed. Fill August 24th, 2020, Disp: 100 tablet, Rfl: 0 .  ketoconazole (NIZORAL) 2 % cream, Apply 1 application topically daily., Disp: 60 g, Rfl: 1 .  losartan (COZAAR) 50 MG tablet, Take 1 tablet (50 mg total) by mouth daily., Disp: 90 tablet, Rfl: 0 .  Magnesium Oxide 500 MG TABS, Take 1 tablet (500 mg total) by mouth daily., Disp: 90 tablet, Rfl: 0 .  Melatonin-Pyridoxine (MELATONEX PO), Take by mouth., Disp: , Rfl:  .  multivitamin-iron-minerals-folic acid (CENTRUM) chewable tablet, Chew 1 tablet by mouth daily., Disp: 90 tablet, Rfl: 3 .  Potassium 99 MG TABS, Take by mouth., Disp: , Rfl:  .  pregabalin (LYRICA) 225 MG capsule, TAKE 1 CAPSULE BY MOUTH TWICE DAILY, Disp: 180 capsule, Rfl: 1 .  SYNTHROID 137 MCG tablet, TAKE 1 TABLET EVERY DAY AND 1 AND 1/2 TABLETS ON SUNDAYS., Disp: 90 tablet, Rfl: 1 .  traZODone (DESYREL) 100 MG tablet, TAKE 1 TO 1 AND 1/2 TABLETS BY MOUTH AT BEDTIME, Disp: 135 tablet, Rfl: 0 .  HYDROcodone-acetaminophen (NORCO) 7.5-325 MG tablet, Take 1 tablet by mouth 3 (three) times daily as needed for moderate pain. Fill July 25 th, 2020, Disp: 80 tablet, Rfl: 0 .  HYDROcodone-acetaminophen (NORCO) 7.5-325 MG tablet, Take 1 tablet by mouth 3 (three) times  daily as needed for moderate pain., Disp: 80 tablet, Rfl: 0  Allergies  Allergen Reactions  . Morphine Itching    I personally reviewed active problem list, medication list, allergies, family history, social history, health maintenance with the patient/caregiver today.   ROS  Constitutional: Negative for fever or weight change.  Respiratory: Negative for cough and shortness of breath.   Cardiovascular: Negative for chest pain or palpitations.  Gastrointestinal: Negative for abdominal pain, no bowel changes.  Musculoskeletal: Negative for gait problem or joint swelling.  Skin: Negative for rash.  Neurological: Negative for dizziness or headache.  No other specific complaints in a complete review of systems (except as listed in HPI above).   Objective  Vitals:   09/16/19 0816  BP: 118/86  Pulse: 88  Resp: 16  Temp: (!) 97.3 F (36.3 C)  TempSrc: Temporal  SpO2: 97%  Weight: (!) 315 lb 1.6 oz (142.9 kg)  Height: 5\' 9"  (1.753 m)    Body mass index is 46.53 kg/m.  Physical Exam  Constitutional: Patient appears well-developed and well-nourished. Obese  No distress.  HEENT: head atraumatic, normocephalic, pupils equal and reactive to light Cardiovascular: Normal rate, regular rhythm and normal heart sounds.  No murmur heard. No BLE edema. Pulmonary/Chest: Effort normal and breath sounds normal. No respiratory distress. Abdominal: Soft.  There is no tenderness. Psychiatric: Patient has a normal mood and affect. behavior is normal. Judgment and thought content normal.  Diabetic Foot Exam: Diabetic Foot Exam - Simple   Simple Foot Form Diabetic Foot exam was performed with the following findings: Yes 09/16/2019  9:00 AM  Visual Inspection See comments: Yes Sensation Testing Intact to touch and monofilament testing bilaterally: Yes Pulse Check Posterior Tibialis and Dorsalis pulse intact bilaterally: Yes Comments Deformity on both big toe nails        PHQ2/9: Depression screen [facility] 2/9 09/16/2019 06/18/2019 02/24/2019 08/21/2018 05/21/2018  Decreased Interest 0 0 0 0 0  Down, Depressed, Hopeless 0 0  0 0 0  PHQ - 2 Score 0 0 0 0 0  Altered sleeping 0 0 0 - 0  Tired, decreased energy 0 0 0 - 1  Change in appetite 0 0 0 - 0  Feeling bad or failure about yourself  0 0 0 - 0  Trouble concentrating 0 0 0 - 0  Moving slowly or fidgety/restless 0 0 0 - 0  Suicidal thoughts 0 0 0 - 0  PHQ-9 Score 0 0 0 - 1  Difficult doing work/chores Not difficult at all - - - Not difficult at all    phq 9 is negative   Fall Risk: Fall Risk  09/16/2019 06/18/2019 02/24/2019 11/18/2018 10/08/2018  Falls in the past year? 0 0 0 0 No  Number falls in past yr: 0 0 0 - -  Injury with Fall? 0 0 0 - -    Functional Status Survey: Is the patient deaf or have difficulty hearing?: No Does the patient have difficulty seeing, even when wearing glasses/contacts?: No Does the patient have difficulty concentrating, remembering, or making decisions?: No Does the patient have difficulty walking or climbing stairs?: No Does the patient have difficulty dressing or bathing?: No Does the patient have difficulty doing errands alone such as visiting a doctor's office or shopping?: No    Assessment & Plan   1. Diabetes mellitus type 2 in obese (HCC)  - POCT HgB A1C - losartan (COZAAR) 25 MG tablet; Take 1 tablet (25 mg total) by mouth daily.  Dispense: 90 tablet; Refill: 1  2. Need for immunization against influenza  - Flu Vaccine QUAD 36+ mos IM  3. Muscle cramps  - cyclobenzaprine (FLEXERIL) 10 MG tablet; Take 1 tablet (10 mg total) by mouth at bedtime.  Dispense: 90 tablet; Refill: 1  4. Chronic nonmalignant pain  - HYDROcodone-acetaminophen (NORCO) 7.5-325 MG tablet; Take 1 tablet by mouth 3 (three) times daily as needed. Fill Nov 22nd 2020  Dispense: 100 tablet; Refill: 0 - HYDROcodone-acetaminophen (NORCO) 7.5-325 MG tablet; Take 1 tablet by mouth 3  (three) times daily as needed for moderate pain. Fill  October 23 rd,  2020  Dispense: 90 tablet; Refill: 0 - HYDROcodone-acetaminophen (NORCO) 7.5-325 MG tablet; Take 1 tablet by mouth 3 (three) times daily as needed for moderate pain.  Dispense: 80 tablet; Refill: 0 - pregabalin (LYRICA) 225 MG capsule; TAKE 1 CAPSULE BY MOUTH TWICE DAILY  Dispense: 180 capsule; Refill: 1  5. Chronic midline low back pain with bilateral sciatica  - HYDROcodone-acetaminophen (NORCO) 7.5-325 MG tablet; Take 1 tablet by mouth 3 (three) times daily as needed. Fill Nov 22nd 2020  Dispense: 100 tablet; Refill: 0 - HYDROcodone-acetaminophen (NORCO) 7.5-325 MG tablet; Take 1 tablet by mouth 3 (three) times daily as needed for moderate pain. Fill  October 23 rd,  2020  Dispense: 90 tablet; Refill: 0 - HYDROcodone-acetaminophen (NORCO) 7.5-325 MG tablet; Take 1 tablet by mouth 3 (three) times daily as needed for moderate pain.  Dispense: 80 tablet; Refill: 0  6. Chronic midline low back pain with sciatica, sciatica laterality unspecified  - pregabalin (LYRICA) 225 MG capsule; TAKE 1 CAPSULE BY MOUTH TWICE DAILY  Dispense: 180 capsule; Refill: 1  7. Other insomnia  - traZODone (DESYREL) 100 MG tablet; Take 1.5 tablets (150 mg total) by mouth at bedtime.  Dispense: 135 tablet; Refill: 0  8. Vitamin D deficiency   9. Bariatric surgery status   10. B12 deficiency   11. Dyslipidemia   12. Acquired  hypothyroidism  Continue current dose  13. Morbid obesity ([place])  Discussed with the patient the risk posed by an increased BMI. Discussed importance of portion control, calorie counting and at least 150 minutes of physical activity weekly. Avoid sweet beverages and drink more water. Eat at least 6 servings of fruit and vegetables daily   14. Eczema of hand  - ammonium lactate (LAC-HYDRIN) 12 % cream; Apply topically as needed for dry skin.  Dispense: 385 g; Refill: 0  15. Controlled gout  - allopurinol  (ZYLOPRIM) 100 MG tablet; Take 1 tablet (100 mg total) by mouth daily.  Dispense: 90 tablet; Refill: 1

## 2019-09-21 ENCOUNTER — Ambulatory Visit: Payer: No Typology Code available for payment source | Admitting: Family Medicine

## 2019-09-23 LAB — HYDROCODONE + METABOLITES, U
Hydrocodone Ur-Mcnc: 952 ng/mL
Hydromorphone Ur-Mcnc: 470 ng/mL

## 2019-11-20 ENCOUNTER — Other Ambulatory Visit: Payer: Self-pay | Admitting: Family Medicine

## 2019-11-20 DIAGNOSIS — G4709 Other insomnia: Secondary | ICD-10-CM

## 2019-12-16 ENCOUNTER — Ambulatory Visit (INDEPENDENT_AMBULATORY_CARE_PROVIDER_SITE_OTHER): Payer: No Typology Code available for payment source | Admitting: Family Medicine

## 2019-12-16 ENCOUNTER — Encounter: Payer: Self-pay | Admitting: Family Medicine

## 2019-12-16 ENCOUNTER — Other Ambulatory Visit: Payer: Self-pay

## 2019-12-16 DIAGNOSIS — R252 Cramp and spasm: Secondary | ICD-10-CM | POA: Diagnosis not present

## 2019-12-16 DIAGNOSIS — E785 Hyperlipidemia, unspecified: Secondary | ICD-10-CM | POA: Diagnosis not present

## 2019-12-16 DIAGNOSIS — E1169 Type 2 diabetes mellitus with other specified complication: Secondary | ICD-10-CM | POA: Diagnosis not present

## 2019-12-16 DIAGNOSIS — E559 Vitamin D deficiency, unspecified: Secondary | ICD-10-CM

## 2019-12-16 DIAGNOSIS — M1712 Unilateral primary osteoarthritis, left knee: Secondary | ICD-10-CM

## 2019-12-16 DIAGNOSIS — E669 Obesity, unspecified: Secondary | ICD-10-CM

## 2019-12-16 DIAGNOSIS — M5442 Lumbago with sciatica, left side: Secondary | ICD-10-CM

## 2019-12-16 DIAGNOSIS — E538 Deficiency of other specified B group vitamins: Secondary | ICD-10-CM

## 2019-12-16 DIAGNOSIS — G8929 Other chronic pain: Secondary | ICD-10-CM

## 2019-12-16 DIAGNOSIS — M5441 Lumbago with sciatica, right side: Secondary | ICD-10-CM

## 2019-12-16 DIAGNOSIS — G4709 Other insomnia: Secondary | ICD-10-CM

## 2019-12-16 DIAGNOSIS — E039 Hypothyroidism, unspecified: Secondary | ICD-10-CM

## 2019-12-16 DIAGNOSIS — Z8739 Personal history of other diseases of the musculoskeletal system and connective tissue: Secondary | ICD-10-CM

## 2019-12-16 DIAGNOSIS — I10 Essential (primary) hypertension: Secondary | ICD-10-CM

## 2019-12-16 MED ORDER — TRAZODONE HCL 100 MG PO TABS: ORAL_TABLET | ORAL | 0 refills | Status: DC

## 2019-12-16 MED ORDER — SYNTHROID 137 MCG PO TABS: ORAL_TABLET | ORAL | 1 refills | Status: DC

## 2019-12-16 MED ORDER — HYDROCODONE-ACETAMINOPHEN 7.5-325 MG PO TABS: 1.0000 | ORAL_TABLET | Freq: Three times a day (TID) | ORAL | 0 refills | Status: DC | PRN

## 2019-12-16 MED ORDER — ATORVASTATIN CALCIUM 10 MG PO TABS: 10.0000 mg | ORAL_TABLET | Freq: Every day | ORAL | 1 refills | Status: DC

## 2019-12-16 NOTE — Progress Notes (Signed)
Name: [name]   MRN: [id number]    DOB: [birth year]   Date:12/16/2019       Progress Note  Subjective  Chief Complaint  Chief Complaint  Patient presents with  . Diabetes  . Obesity  . Hypertension  . Hypothyroidism    I connected with  [name]  on 12/16/19 at  7:40 AM EST by a video enabled telemedicine application and verified that I am speaking with the correct person using two identifiers.  I discussed the limitations of evaluation and management by telemedicine and the availability of in person appointments. The patient expressed understanding and agreed to proceed. Staff also discussed with the patient that there may be a patient responsible charge related to this service. Patient Location: at home Provider Location: [facility]  HPI  DMII: diet controlled since bariatric surgery10/2013,but hgbA1C is gradually increasingbut has been stable over the pastyear, it was  6.2% on his last visit, today is a virtual visit and we will repeat level in 3 months. Denies polyphagia, polydipsia or polyuriaEye exam is due and reminded him to schedule it. They are grilled at home, avoiding eating out, avoiding sweets. He has physical job, Office manager.  Muscle Cramps: he states symptoms have improved with B12, D, magnesium  and calcium supplementationbut still has cramps sometimes at night.He has been drinking 4-5 bottles of water daily.  Hypothyroidism: taking same dose of Synthroid for many months, last TSH at goal. He has chronic dry skin, no hair lossorconstipation. He thinks he lost weigh because he has been more active at work.  Chronic low back pain : he is taking Norco 7.5/325mg  about three times per day,he used to get 100 pills during the winter months,and75 duringwarmer months, since it is getting cold we will change to 80 this month, 90 and today we will go up to  100 tables  Taking Lyricaand it seems to help with pain.No longer  under disability , working full time and has a physical job Office manager, he is out in the elements and cold makes pain worse, he stopped taking goody-powders  Pain contract singed03/2020, drug screen normal 08/2019 . Pain level right now is 6/10 on lower back and both legs  Neuropathy: he has bilateral daily pain on both legs, secondary to chronic back problems, aching like, he denies burning or prickly sensation, Lyrica helps.Current pain of5/10. Stable   History of bariatric surgery: surgery 09/2012 was he is morbidly obesehis weight at the time was 396 lbs- lowest weight after surgery was 275 lbs,he gradually gained weight back, his last weight in our office 315  lbs, he thinks he has lost weight since last visit He is now packing his lunch instead of eating out. He is drinking more water now. 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 Chronic midline low back pain with sciatica 02/11/2017  . Anemia, unspecified 06/12/2016  . B12 deficiency 06/11/2016  . Allergic conjunctivitis 06/11/2016  . Hypocalcemia 06/11/2016  . Muscle cramps 06/11/2016  . Hypertension, benign 11/03/2015  . Dyslipidemia 11/02/2015  . Acquired nystagmus 08/01/2015  . Chronic back pain 06/29/2015  . Carpal tunnel syndrome 06/29/2015  . Osteoarthritis 06/29/2015  . Claustrophobia 06/29/2015  . Foot drop, left 06/29/2015  . Hiatal hernia 06/29/2015  . H/O diabetes  mellitus 06/29/2015  . H/O testicular cancer 06/29/2015  . Acquired hypothyroidism 06/29/2015  . Dysmetabolic syndrome [id number]  . Morbid obesity ([place]) 06/29/2015  . Allergic rhinitis 06/29/2015  . Neuropathy 06/29/2015  . Seborrheic keratoses 06/29/2015  . Vitamin D deficiency 06/29/2015  . Bariatric surgery status 02/20/2012    Past Surgical History:  Procedure  Laterality Date  . BACK SURGERY     X 2  . HERNIA REPAIR     left side and umbilical hernia repair  . 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 URETEROTOMY Left 2000    Family History  Problem Relation Age of Onset  . Coronary artery disease Mother   . Cancer Father        Melanoma and Testicular  . Diabetes Father     Social History   Socioeconomic History  . Marital status: Married    Spouse name: Not on file  . Number of children: 2  . Years of education: Not on file  . Highest education level: Some college, no degree  Occupational History  . 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 Smoking status: Former Smoker    Packs/day: 3.00    Years: 8.00    Pack years: 24.00    Types: Cigarettes, Cigars, Pipe    Start date: 06/28/1984    Quit date: 12/24/1991    Years since quitting: 27.9  . Smokeless tobacco: Former Systems developer    Types: [place] date: 12/24/1991  Substance and Sexual Activity  . Alcohol use: No    Alcohol/week: 0.0 standard drinks  . Drug use: No  . Sexual activity: Yes    Partners: Female  Other Topics Concern  . Not on file  Social History Narrative   Married, lives with wife, both children and grandchildren   Social Determinants of Health   Financial Resource Strain:   . Difficulty of Paying Living Expenses: Not on file  Food Insecurity:   . Worried About Charity fundraiser in the Last Year: Not on file  . Ran Out of Food in the Last Year: Not on file  Transportation Needs:   . Lack of Transportation (Medical): Not on file  . Lack of Transportation (Non-Medical): Not on file  Physical Activity: Sufficiently Active  . Days of Exercise per Week: 7 days  . Minutes of Exercise per Session: 120 min  Stress:   . Feeling of Stress : Not on file  Social Connections: Slightly Isolated  . Frequency of Communication with Friends and Family: Twice a week  . Frequency of Social Gatherings with Friends and Family: Never  .  Attends Religious Services: More than 4 times per year  . Active Member of Clubs or Organizations: Yes  . Attends Archivist Meetings: More than 4 times per year  . Marital Status: Married  Human resources officer Violence:   . Fear of Current or Ex-Partner: Not on file  . Emotionally Abused: Not on file  . Physically Abused: Not on file  . Sexually Abused: Not on file     Current Outpatient Medications:  .  allopurinol (ZYLOPRIM) 100 MG tablet, Take 1 tablet (100 mg total) by mouth daily., Disp: 90 tablet, Rfl: 1 .  ammonium lactate (LAC-HYDRIN) 12 % cream, Apply topically as needed for dry skin., Disp: 385 g, Rfl: 0 .  atorvastatin (LIPITOR) 10 MG tablet, TAKE 1 TABLET BY MOUTH EVERY DAY, Disp: 90 tablet, Rfl:  1 .  Calcium Carbonate-Vit D-Min (CALTRATE 600+D PLUS MINERALS) 600-800 MG-UNIT CHEW, Chew 1 tablet by mouth daily., Disp: 180 tablet, Rfl: 1 .  Cyanocobalamin (VITAMIN B-12) 500 MCG SUBL, Place 1 tablet (500 mcg total) under the tongue daily., Disp: 90 tablet, Rfl: 3 .  cyclobenzaprine (FLEXERIL) 10 MG tablet, Take 1 tablet (10 mg total) by mouth at bedtime., Disp: 90 tablet, Rfl: 1 .  diclofenac sodium (VOLTAREN) 1 % GEL, APPLY 4 GRAMS TOPICALLY FOUR TIMES DAILY TO KNEE, Disp: 100 g, Rfl: 1 .  ferrous sulfate 325 (65 FE) MG tablet, Take 1 tablet (325 mg total) by mouth daily with breakfast., Disp: 90 tablet, Rfl: 3 .  fexofenadine (ALLEGRA) 180 MG tablet, TAKE 1 TABLET(180 MG TOTAL) BY MOUTH DAILY, Disp: 90 tablet, Rfl: 3 .  Homeopathic Products ([employer] LEG CRAMPS PAIN RELIEF PO), Take 2 tablets by mouth 2 (two) times daily at 10 am and 4 pm., Disp: , Rfl:  .  HYDROcodone-acetaminophen (NORCO) 7.5-325 MG tablet, Take 1 tablet by mouth 3 (three) times daily as needed. Fill Nov 22nd 2020, Disp: 100 tablet, Rfl: 0 .  HYDROcodone-acetaminophen (NORCO) 7.5-325 MG tablet, Take 1 tablet by mouth 3 (three) times daily as needed for moderate pain. Fill  October 23 rd,  2020, Disp: 90 tablet,  Rfl: 0 .  HYDROcodone-acetaminophen (NORCO) 7.5-325 MG tablet, Take 1 tablet by mouth 3 (three) times daily as needed for moderate pain., Disp: 80 tablet, Rfl: 0 .  ketoconazole (NIZORAL) 2 % cream, Apply 1 application topically daily., Disp: 60 g, Rfl: 1 .  losartan (COZAAR) 25 MG tablet, Take 1 tablet (25 mg total) by mouth daily., Disp: 90 tablet, Rfl: 1 .  Magnesium Oxide 500 MG TABS, Take 1 tablet (500 mg total) by mouth daily., Disp: 90 tablet, Rfl: 0 .  Melatonin-Pyridoxine (MELATONEX PO), Take by mouth., Disp: , Rfl:  .  multivitamin-iron-minerals-folic acid (CENTRUM) chewable tablet, Chew 1 tablet by mouth daily., Disp: 90 tablet, Rfl: 3 .  Potassium 99 MG TABS, Take by mouth., Disp: , Rfl:  .  pregabalin (LYRICA) 225 MG capsule, TAKE 1 CAPSULE BY MOUTH TWICE DAILY, Disp: 180 capsule, Rfl: 1 .  SYNTHROID 137 MCG tablet, TAKE 1 TABLET EVERY DAY AND 1 AND 1/2 TABLETS ON SUNDAYS., Disp: 90 tablet, Rfl: 1 .  traZODone (DESYREL) 100 MG tablet, TAKE 1 AND 1/2 TABLETS(150 MG) BY MOUTH AT BEDTIME, Disp: 135 tablet, Rfl: 0  Allergies  Allergen Reactions  . Morphine Itching    I personally reviewed active problem list, medication list, allergies, family history, social history, health maintenance with the patient/caregiver today.   ROS  Ten systems reviewed and is negative except as mentioned in HPI   Objective  Virtual encounter, vitals not obtained.  There is no height or weight on file to calculate BMI.  Physical Exam  Awake, alert and oriented   PHQ2/9: Depression screen [facility] 2/9 12/16/2019 09/16/2019 06/18/2019 02/24/2019 08/21/2018  Decreased Interest 0 0 0 0 0  Down, Depressed, Hopeless 0 0 0 0 0  PHQ - 2 Score 0 0 0 0 0  Altered sleeping 0 0 0 0 -  Tired, decreased energy 0 0 0 0 -  Change in appetite 0 0 0 0 -  Feeling bad or failure about yourself  0 0 0 0 -  Trouble concentrating 0 0 0 0 -  Moving slowly or fidgety/restless 0 0 0 0 -  Suicidal thoughts 0 0 0 0 -  PHQ-9 Score 0 0 0 0 -  Difficult doing work/chores - Not difficult at all - - -   PHQ-2/9 Result is negative.    Fall Risk: Fall Risk  12/16/2019 09/16/2019 06/18/2019 02/24/2019 11/18/2018  Falls in the past year? 0 0 0 0 0  Number falls in past yr: 0 0 0 0 -  Injury with Fall? 0 0 0 0 -     Assessment & Plan  1. Diabetes mellitus type 2 in obese (HCC)  - atorvastatin (LIPITOR) 10 MG tablet; Take 1 tablet (10 mg total) by mouth daily.  Dispense: 90 tablet; Refill: 1  2. Muscle cramps  Stable on medications  3. B12 deficiency  Continue supplementation  4. Dyslipidemia  Recheck labs on his next visit  5. Acquired hypothyroidism  - SYNTHROID 137 MCG tablet; TAKE 1 TABLET EVERY DAY AND 1 AND 1/2 TABLETS ON SUNDAYS.  Dispense: 90 tablet; Refill: 1  6. Morbid obesity ([place])  He states losing weight, more active  7. Hypertension, benign  On low dose ARB  8. History of gout   9. Primary osteoarthritis of left knee  Advised to add two Tylenol 500 mg daily between hydrococone done, continue voltaren gel   10. Vitamin D deficiency  Continue supplementation   11. Chronic nonmalignant pain  - HYDROcodone-acetaminophen (NORCO) 7.5-325 MG tablet; Take 1 tablet by mouth 3 (three) times daily as needed. Fill January 23 rd, 2021  Dispense: 100 tablet; Refill: 0 - HYDROcodone-acetaminophen (NORCO) 7.5-325 MG tablet; Take 1 tablet by mouth 3 (three) times daily as needed for moderate pain. Fill Feb 23rd, 2021  Dispense: 100 tablet; Refill: 0 - HYDROcodone-acetaminophen (NORCO) 7.5-325 MG tablet; Take 1 tablet by mouth 3 (three) times daily as needed for moderate pain.  Dispense: 100 tablet; Refill: 0  12. Other insomnia  - traZODone (DESYREL) 100 MG tablet; TAKE 1 AND 1/2 TABLETS(150 MG) BY MOUTH AT BEDTIME  Dispense: 135 tablet; Refill: 0  13. Chronic midline low back pain with bilateral sciatica  - HYDROcodone-acetaminophen (NORCO) 7.5-325 MG tablet; Take 1 tablet by  mouth 3 (three) times daily as needed. Fill January 23 rd, 2021  Dispense: 100 tablet; Refill: 0 - HYDROcodone-acetaminophen (NORCO) 7.5-325 MG tablet; Take 1 tablet by mouth 3 (three) times daily as needed for moderate pain. Fill Feb 23rd, 2021  Dispense: 100 tablet; Refill: 0 - HYDROcodone-acetaminophen (NORCO) 7.5-325 MG tablet; Take 1 tablet by mouth 3 (three) times daily as needed for moderate pain.  Dispense: 100 tablet; Refill: 0  I discussed the assessment and treatment plan with the patient. The patient was provided an opportunity to ask questions and all were answered. The patient agreed with the plan and demonstrated an understanding of the instructions.  The patient was advised to call back or seek an in-person evaluation if the symptoms worsen or if the condition fails to improve as anticipated.  I provided 25 minutes of non-face-to-face time during this encounter.

## 2020-03-10 ENCOUNTER — Ambulatory Visit: Payer: Self-pay | Attending: Internal Medicine

## 2020-03-10 DIAGNOSIS — Z23 Encounter for immunization: Secondary | ICD-10-CM

## 2020-03-10 NOTE — Progress Notes (Signed)
   Covid-19 Vaccination Clinic  Name:  [name]    MRN: [id number] DOB: [birth year]  03/10/2020  Mr. [name] was observed post Covid-19 immunization for 15 minutes without incident. He was provided with Vaccine Information Sheet and instruction to access the V-Safe system.   Mr. [name] was instructed to call 911 with any severe reactions post vaccine: Marland Kitchen Difficulty breathing  . Swelling of face and throat  . A fast heartbeat  . A bad rash all over body  . Dizziness and weakness   Immunizations Administered    Name Date Dose VIS Date Route   Pfizer COVID-19 Vaccine 03/10/2020  3:34 PM 0.3 mL 12/03/2019 Intramuscular   Manufacturer: [place]   Lot: [id number]   [place]: [id number]

## 2020-03-16 ENCOUNTER — Ambulatory Visit (INDEPENDENT_AMBULATORY_CARE_PROVIDER_SITE_OTHER): Payer: No Typology Code available for payment source | Admitting: Family Medicine

## 2020-03-16 ENCOUNTER — Other Ambulatory Visit: Payer: Self-pay

## 2020-03-16 ENCOUNTER — Encounter: Payer: Self-pay | Admitting: Family Medicine

## 2020-03-16 VITALS — BP 138/84 | HR 70 | Temp 97.7°F | Resp 18 | Ht 69.0 in | Wt 317.4 lb

## 2020-03-16 DIAGNOSIS — E1169 Type 2 diabetes mellitus with other specified complication: Secondary | ICD-10-CM

## 2020-03-16 DIAGNOSIS — M5442 Lumbago with sciatica, left side: Secondary | ICD-10-CM

## 2020-03-16 DIAGNOSIS — E039 Hypothyroidism, unspecified: Secondary | ICD-10-CM | POA: Diagnosis not present

## 2020-03-16 DIAGNOSIS — G4709 Other insomnia: Secondary | ICD-10-CM

## 2020-03-16 DIAGNOSIS — E538 Deficiency of other specified B group vitamins: Secondary | ICD-10-CM | POA: Diagnosis not present

## 2020-03-16 DIAGNOSIS — G8929 Other chronic pain: Secondary | ICD-10-CM

## 2020-03-16 DIAGNOSIS — M5441 Lumbago with sciatica, right side: Secondary | ICD-10-CM

## 2020-03-16 DIAGNOSIS — Z8739 Personal history of other diseases of the musculoskeletal system and connective tissue: Secondary | ICD-10-CM

## 2020-03-16 DIAGNOSIS — I10 Essential (primary) hypertension: Secondary | ICD-10-CM

## 2020-03-16 DIAGNOSIS — Z9884 Bariatric surgery status: Secondary | ICD-10-CM

## 2020-03-16 DIAGNOSIS — E785 Hyperlipidemia, unspecified: Secondary | ICD-10-CM

## 2020-03-16 DIAGNOSIS — E669 Obesity, unspecified: Secondary | ICD-10-CM

## 2020-03-16 DIAGNOSIS — E559 Vitamin D deficiency, unspecified: Secondary | ICD-10-CM

## 2020-03-16 DIAGNOSIS — M109 Gout, unspecified: Secondary | ICD-10-CM

## 2020-03-16 LAB — POCT GLYCOSYLATED HEMOGLOBIN (HGB A1C): Hemoglobin A1C: 6.3 % — AB (ref 4.0–5.6)

## 2020-03-16 MED ORDER — HYDROCODONE-ACETAMINOPHEN 7.5-325 MG PO TABS: 1.0000 | ORAL_TABLET | Freq: Three times a day (TID) | ORAL | 0 refills | Status: DC | PRN

## 2020-03-16 MED ORDER — LOSARTAN POTASSIUM 25 MG PO TABS: 25.0000 mg | ORAL_TABLET | Freq: Every day | ORAL | 1 refills | Status: DC

## 2020-03-16 MED ORDER — TRAZODONE HCL 100 MG PO TABS: ORAL_TABLET | ORAL | 0 refills | Status: DC

## 2020-03-16 NOTE — Progress Notes (Signed)
Name: [name]   MRN: [id number]    DOB: [birth year]   Date:03/16/2020       Progress Note  Subjective  Chief Complaint  Chief Complaint  Patient presents with  . Diabetes  . Hypertension  . Hyperlipidemia  . Hypothyroidism  . Pain    refill medication    HPI  DMII: diet controlled since bariatric surgery10/2013,but hgbA1C is gradually increasingbut has been stable over the pastyear, it was  6.2% on his last visit, today it is 6.3% . Denies polyphagia, polydipsia or polyuria. Eye exam has been scheduled for May 2021 He is eating at home, sometimes eats at work - but they grill outside. Avoiding sweets - but has a piece of candy a couple of times a day, his work is physical digging graves   Muscle Cramps: he states symptoms have improved with B12, D, magnesium  and calcium supplementationbut still has cramps sometimes at night.He has been drinking 4-5 bottles of water daily.He states symptoms are not daily, he is doing okay lately, seems to be worse when very hot and he sweats a lot. Advised to take Gatorade Zero or Propel to work during hot days   Hypothyroidism: taking same dose of Synthroid for many months, last TSH at goal. He has chronic dry skin, no hair lossorconstipation.   Chronic low back pain : he is taking Norco 7.5/325mg  mostly twice daily now and we will go down today from 100 pills to 70 since we are on warmer months.  times per day Taking Lyricaand it seems to help with pain.No longer under disability , working full time and has a physical job Office manager, he is out in the elements and cold makes pain worse, he stopped taking goody-powdersPain contract singed03/2020- he will sign another pain contract today, drug screen normal 08/2019 . Pain level right now is4/10 on lower back and both legs  Neuropathy: he has bilateral daily pain on both legs, secondary to chronic back problems, aching like, he denies burning or prickly sensation, Lyrica  helps.Current pain of3/10.Stable   History of bariatric surgery: surgery 09/2012 was he is morbidly obesehis weight at the time was 396 lbs- lowest weight after surgery was 275 lbs,he gradually gained weight back, today it is 317 lbs He is drinking more water now. His job is very active, he worked 126 hours in the past 2 weeks digging graves  HTN:he is taking low dose losartan, no chest pain, palpitation or dizziness. Unchanged   Patient Active Problem List   Diagnosis Date Noted  . Chronic midline low back pain with sciatica 02/11/2017  . Anemia, unspecified 06/12/2016  . B12 deficiency 06/11/2016  . Allergic conjunctivitis 06/11/2016  . Hypocalcemia 06/11/2016  . Muscle cramps 06/11/2016  . Hypertension, benign 11/03/2015  . Dyslipidemia 11/02/2015  . Acquired nystagmus 08/01/2015  . Chronic back pain 06/29/2015  . Carpal tunnel syndrome 06/29/2015  . Osteoarthritis 06/29/2015  . Claustrophobia 06/29/2015  . Foot drop, left 06/29/2015  . Hiatal hernia 06/29/2015  . H/O diabetes mellitus 06/29/2015  . H/O testicular cancer 06/29/2015  . Acquired hypothyroidism 06/29/2015  . Dysmetabolic syndrome [id number]  . Morbid obesity ([place]) 06/29/2015  . Allergic rhinitis 06/29/2015  . Neuropathy 06/29/2015  . Seborrheic keratoses 06/29/2015  . Vitamin D deficiency 06/29/2015  . Bariatric surgery status 02/20/2012    Past Surgical History:  Procedure Laterality Date  . BACK SURGERY     X 2  . HERNIA REPAIR     left side and  umbilical hernia repair  . 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 URETEROTOMY Left 2000    Family History  Problem Relation Age of Onset  . Coronary artery disease Mother   . Cancer Father        Melanoma and Testicular  . Diabetes Father     Social History   Tobacco Use  . Smoking status: Former Smoker    Packs/day: 3.00    Years: 8.00    Pack years: 24.00    Types: Cigarettes, Cigars, Pipe    Start date: 06/28/1984    Quit date: 12/24/1991     Years since quitting: 28.2  . Smokeless tobacco: Former Systems developer    Types: Chew    Quit date: 12/24/1991  Substance Use Topics  . Alcohol use: No    Alcohol/week: 0.0 standard drinks     Current Outpatient Medications:  .  allopurinol (ZYLOPRIM) 100 MG tablet, Take 1 tablet (100 mg total) by mouth daily., Disp: 90 tablet, Rfl: 1 .  ammonium lactate (LAC-HYDRIN) 12 % cream, Apply topically as needed for dry skin., Disp: 385 g, Rfl: 0 .  atorvastatin (LIPITOR) 10 MG tablet, Take 1 tablet (10 mg total) by mouth daily., Disp: 90 tablet, Rfl: 1 .  Calcium Carbonate-Vit D-Min (CALTRATE 600+D PLUS MINERALS) 600-800 MG-UNIT CHEW, Chew 1 tablet by mouth daily., Disp: 180 tablet, Rfl: 1 .  Cyanocobalamin (VITAMIN B-12) 500 MCG SUBL, Place 1 tablet (500 mcg total) under the tongue daily., Disp: 90 tablet, Rfl: 3 .  cyclobenzaprine (FLEXERIL) 10 MG tablet, Take 1 tablet (10 mg total) by mouth at bedtime., Disp: 90 tablet, Rfl: 1 .  diclofenac sodium (VOLTAREN) 1 % GEL, APPLY 4 GRAMS TOPICALLY FOUR TIMES DAILY TO KNEE, Disp: 100 g, Rfl: 1 .  ferrous sulfate 325 (65 FE) MG tablet, Take 1 tablet (325 mg total) by mouth daily with breakfast., Disp: 90 tablet, Rfl: 3 .  fexofenadine (ALLEGRA) 180 MG tablet, TAKE 1 TABLET(180 MG TOTAL) BY MOUTH DAILY, Disp: 90 tablet, Rfl: 3 .  Homeopathic Products ([employer] LEG CRAMPS PAIN RELIEF PO), Take 2 tablets by mouth 2 (two) times daily at 10 am and 4 pm., Disp: , Rfl:  .  HYDROcodone-acetaminophen (NORCO) 7.5-325 MG tablet, Take 1 tablet by mouth 3 (three) times daily as needed. Fill January 23 rd, 2021, Disp: 100 tablet, Rfl: 0 .  HYDROcodone-acetaminophen (NORCO) 7.5-325 MG tablet, Take 1 tablet by mouth 3 (three) times daily as needed for moderate pain. Fill Feb 23rd, 2021, Disp: 100 tablet, Rfl: 0 .  HYDROcodone-acetaminophen (NORCO) 7.5-325 MG tablet, Take 1 tablet by mouth 3 (three) times daily as needed for moderate pain., Disp: 100 tablet, Rfl: 0 .  ketoconazole  (NIZORAL) 2 % cream, Apply 1 application topically daily., Disp: 60 g, Rfl: 1 .  losartan (COZAAR) 25 MG tablet, Take 1 tablet (25 mg total) by mouth daily., Disp: 90 tablet, Rfl: 1 .  Magnesium Oxide 500 MG TABS, Take 1 tablet (500 mg total) by mouth daily., Disp: 90 tablet, Rfl: 0 .  Melatonin-Pyridoxine (MELATONEX PO), Take by mouth., Disp: , Rfl:  .  multivitamin-iron-minerals-folic acid (CENTRUM) chewable tablet, Chew 1 tablet by mouth daily., Disp: 90 tablet, Rfl: 3 .  Potassium 99 MG TABS, Take by mouth., Disp: , Rfl:  .  pregabalin (LYRICA) 225 MG capsule, TAKE 1 CAPSULE BY MOUTH TWICE DAILY, Disp: 180 capsule, Rfl: 1 .  SYNTHROID 137 MCG tablet, TAKE 1 TABLET EVERY DAY AND 1 AND 1/2 TABLETS ON  SUNDAYS., Disp: 90 tablet, Rfl: 1 .  traZODone (DESYREL) 100 MG tablet, TAKE 1 AND 1/2 TABLETS(150 MG) BY MOUTH AT BEDTIME, Disp: 135 tablet, Rfl: 0  Allergies  Allergen Reactions  . Morphine Itching    I personally reviewed active problem list, medication list, allergies, family history, social history, health maintenance with the patient/caregiver today.   ROS  Constitutional: Negative for fever or weight change.  Respiratory: Negative for cough and shortness of breath.   Cardiovascular: Negative for chest pain or palpitations.  Gastrointestinal: Negative for abdominal pain, no bowel changes.  Musculoskeletal: Negative for gait problem or joint swelling.  Skin: Negative for rash.  Neurological: Negative for dizziness or headache.  No other specific complaints in a complete review of systems (except as listed in HPI above).  Objective  Vitals:   03/16/20 0741  BP: 138/84  Pulse: 70  Resp: 18  Temp: 97.7 F (36.5 C)  TempSrc: Temporal  SpO2: 99%  Weight: (!) 317 lb 6.4 oz (144 kg)  Height: 5\' 9"  (1.753 m)    Body mass index is 46.87 kg/m.  Physical Exam  Constitutional: Patient appears well-developed and well-nourished. Obese  No distress.  HEENT: head atraumatic,  normocephalic, pupils equal and reactive to light Cardiovascular: Normal rate, regular rhythm and normal heart sounds.  No murmur heard. No BLE edema. Pulmonary/Chest: Effort normal and breath sounds normal. No respiratory distress. Abdominal: Soft.  There is no tenderness. Psychiatric: Patient has a normal mood and affect. behavior is normal. Judgment and thought content normal. Muscular Skeletal: mild  pain during palpation of lumbar spine, negative straight leg raise   PHQ2/9: Depression screen [facility] 2/9 03/16/2020 12/16/2019 09/16/2019 06/18/2019 02/24/2019  Decreased Interest 0 0 0 0 0  Down, Depressed, Hopeless 0 0 0 0 0  PHQ - 2 Score 0 0 0 0 0  Altered sleeping 0 0 0 0 0  Tired, decreased energy 0 0 0 0 0  Change in appetite 0 0 0 0 0  Feeling bad or failure about yourself  0 0 0 0 0  Trouble concentrating 0 0 0 0 0  Moving slowly or fidgety/restless 0 0 0 0 0  Suicidal thoughts 0 0 0 0 0  PHQ-9 Score 0 0 0 0 0  Difficult doing work/chores Not difficult at all - Not difficult at all - -    phq 9 is negative  Fall Risk: Fall Risk  03/16/2020 12/16/2019 09/16/2019 06/18/2019 02/24/2019  Falls in the past year? 0 0 0 0 0  Number falls in past yr: 0 0 0 0 0  Injury with Fall? 0 0 0 0 0  Follow up Falls evaluation completed - - - -     Assessment & Plan  1. Diabetes mellitus type 2 in obese (HCC)  - POCT glycosylated hemoglobin (Hb A1C) - losartan (COZAAR) 25 MG tablet; Take 1 tablet (25 mg total) by mouth daily.  Dispense: 90 tablet; Refill: 1  2. B12 deficiency   3. Morbid obesity ([place])  Discussed with the patient the risk posed by an increased BMI. Discussed importance of portion control, calorie counting and at least 150 minutes of physical activity weekly. Avoid sweet beverages and drink more water. Eat at least 6 servings of fruit and vegetables daily   4. Acquired hypothyroidism  - TSH  5. Dyslipidemia  - Lipid panel  6. Hypertension, benign  - COMPLETE  METABOLIC PANEL WITH GFR - CBC with Differential/Platelet  7. Vitamin D deficiency  8. History of gout   9. Chronic midline low back pain with bilateral sciatica  - HYDROcodone-acetaminophen (NORCO) 7.5-325 MG tablet; Take 1 tablet by mouth 3 (three) times daily as needed. Fill May 23 rd, 2021  Dispense: 70 tablet; Refill: 0 - HYDROcodone-acetaminophen (NORCO) 7.5-325 MG tablet; Take 1 tablet by mouth 3 (three) times daily as needed for moderate pain. Fill April 24 th, 2021  Dispense: 70 tablet; Refill: 0 - HYDROcodone-acetaminophen (NORCO) 7.5-325 MG tablet; Take 1 tablet by mouth 3 (three) times daily as needed for moderate pain.  Dispense: 70 tablet; Refill: 0  10. Bariatric surgery status   11. Controlled gout   12. Other insomnia  - traZODone (DESYREL) 100 MG tablet; TAKE 1 AND 1/2 TABLETS(150 MG) BY MOUTH AT BEDTIME  Dispense: 135 tablet; Refill: 0  13. Chronic nonmalignant pain  - HYDROcodone-acetaminophen (NORCO) 7.5-325 MG tablet; Take 1 tablet by mouth 3 (three) times daily as needed. Fill May 23 rd, 2021  Dispense: 70 tablet; Refill: 0 - HYDROcodone-acetaminophen (NORCO) 7.5-325 MG tablet; Take 1 tablet by mouth 3 (three) times daily as needed for moderate pain. Fill April 24 th, 2021  Dispense: 70 tablet; Refill: 0 - HYDROcodone-acetaminophen (NORCO) 7.5-325 MG tablet; Take 1 tablet by mouth 3 (three) times daily as needed for moderate pain.  Dispense: 70 tablet; Refill: 0

## 2020-03-17 LAB — COMPLETE METABOLIC PANEL WITH GFR
AG Ratio: 2 (calc) (ref 1.0–2.5)
ALT: 16 U/L (ref 9–46)
AST: 20 U/L (ref 10–40)
Albumin: 4.2 g/dL (ref 3.6–5.1)
Alkaline phosphatase (APISO): 121 U/L (ref 36–130)
BUN: 19 mg/dL (ref 7–25)
CO2: 29 mmol/L (ref 20–32)
Calcium: 9.4 mg/dL (ref 8.6–10.3)
Chloride: 104 mmol/L (ref 98–110)
Creat: 1.13 mg/dL (ref 0.60–1.35)
GFR, Est African American: 89 mL/min/{1.73_m2} (ref >=60)
GFR, Est Non African American: 76 mL/min/{1.73_m2} (ref >=60)
Globulin: 2.1 g/dL (calc) (ref 1.9–3.7)
Glucose, Bld: 137 mg/dL — ABNORMAL HIGH (ref 65–99)
Potassium: 4.5 mmol/L (ref 3.5–5.3)
Sodium: 141 mmol/L (ref 135–146)
Total Bilirubin: 0.9 mg/dL (ref 0.2–1.2)
Total Protein: 6.3 g/dL (ref 6.1–8.1)

## 2020-03-17 LAB — CBC WITH DIFFERENTIAL/PLATELET
Absolute Monocytes: 432 cells/uL (ref 200–950)
Basophils Absolute: 52 cells/uL (ref 0–200)
Basophils Relative: 1 %
Eosinophils Absolute: 198 cells/uL (ref 15–500)
Eosinophils Relative: 3.8 %
HCT: 45.7 % (ref 38.5–50.0)
Hemoglobin: 14.8 g/dL (ref 13.2–17.1)
Lymphs Abs: 1191 cells/uL (ref 850–3900)
MCH: 27.8 pg (ref 27.0–33.0)
MCHC: 32.4 g/dL (ref 32.0–36.0)
MCV: 85.9 fL (ref 80.0–100.0)
MPV: 9.8 fL (ref 7.5–12.5)
Monocytes Relative: 8.3 %
Neutro Abs: 3328 cells/uL (ref 1500–7800)
Neutrophils Relative %: 64 %
Platelets: 191 10*3/uL (ref 140–400)
RBC: 5.32 10*6/uL (ref 4.20–5.80)
RDW: 13.9 % (ref 11.0–15.0)
Total Lymphocyte: 22.9 %
WBC: 5.2 10*3/uL (ref 3.8–10.8)

## 2020-03-17 LAB — LIPID PANEL
Cholesterol: 101 mg/dL (ref <200)
HDL: 35 mg/dL — ABNORMAL LOW (ref >=40)
LDL Cholesterol (Calc): 48 mg/dL (calc)
Non-HDL Cholesterol (Calc): 66 mg/dL (calc) (ref <130)
Total CHOL/HDL Ratio: 2.9 (calc) (ref <5.0)
Triglycerides: 98 mg/dL (ref <150)

## 2020-03-17 LAB — TSH: TSH: 2.05 mIU/L (ref 0.40–4.50)

## 2020-03-30 ENCOUNTER — Other Ambulatory Visit: Payer: Self-pay | Admitting: Family Medicine

## 2020-03-30 DIAGNOSIS — E039 Hypothyroidism, unspecified: Secondary | ICD-10-CM

## 2020-03-30 NOTE — Telephone Encounter (Signed)
Requested Prescriptions  Pending Prescriptions Disp Refills  . SYNTHROID 137 MCG tablet [Pharmacy Med Name: SYNTHROID 0.137MG  (137MCG) TABLETS] 90 tablet 1    Sig: TAKE 1 TABLET BY MOUTH EVERY DAY AND 1 AND 1/2 ON SUNDAYS     Endocrinology:  Hypothyroid Agents Failed - 03/30/2020 10:34 AM      Failed - TSH needs to be rechecked within 3 months after an abnormal result. Refill until TSH is due.      Passed - TSH in normal range and within 360 days    TSH  Date Value Ref Range Status  03/16/2020 2.05 0.40 - 4.50 mIU/L Final         Passed - Valid encounter within last 12 months    Recent Outpatient Visits          2 weeks ago Diabetes mellitus type 2 in obese [facility])   [facility] [place], Drue Stager, MD   3 months ago Diabetes mellitus type 2 in obese [facility])   [facility] [place], Drue Stager, MD   6 months ago Diabetes mellitus type 2 in obese [facility])   [facility] [name], MD   9 months ago Chronic midline low back pain with bilateral sciatica   [name], MD   1 year ago Diabetes mellitus type 2 in obese [facility])   [facility] [name], MD      Future Appointments            In 2 months Ancil Boozer, Drue Stager, MD [facility], [facility] [place]

## 2020-03-31 ENCOUNTER — Other Ambulatory Visit: Payer: Self-pay | Admitting: Family Medicine

## 2020-03-31 ENCOUNTER — Ambulatory Visit: Payer: Self-pay | Attending: Internal Medicine

## 2020-03-31 DIAGNOSIS — M109 Gout, unspecified: Secondary | ICD-10-CM

## 2020-03-31 DIAGNOSIS — R252 Cramp and spasm: Secondary | ICD-10-CM

## 2020-03-31 NOTE — Telephone Encounter (Signed)
Requested medication (s) are due for refill today: yes  Requested medication (s) are on the active medication list: yes  Last refill:  01/13/20  Future visit scheduled: yes  Notes to clinic:  not delegated    Requested Prescriptions  Pending Prescriptions Disp Refills   cyclobenzaprine (FLEXERIL) 10 MG tablet [Pharmacy Med Name: CYCLOBENZAPRINE 10MG  TABLETS] 90 tablet 1    Sig: TAKE 1 TABLET(10 MG) BY MOUTH AT BEDTIME      Not Delegated - Analgesics:  Muscle Relaxants Failed - 03/31/2020  7:34 AM      Failed - This refill cannot be delegated      Passed - Valid encounter within last 6 months    Recent Outpatient Visits           2 weeks ago Diabetes mellitus type 2 in obese [facility])   [name], MD   3 months ago Diabetes mellitus type 2 in obese [facility])   [facility] [place], Drue Stager, MD   6 months ago Diabetes mellitus type 2 in obese [facility]   [facility] [place], Drue Stager, MD   9 months ago Chronic midline low back pain with bilateral sciatica   [facility] [place], Drue Stager, MD   1 year ago Diabetes mellitus type 2 in obese [facility])   [facility] [name], MD       Future Appointments             In 2 months [name], MD [facility]             Signed Prescriptions Disp Refills   allopurinol (ZYLOPRIM) 100 MG tablet 90 tablet 1    Sig: TAKE 1 TABLET(100 MG) BY MOUTH DAILY      Endocrinology:  Gout Agents Failed - 03/31/2020  7:34 AM      Failed - Uric Acid in normal range and within 360 days    Uric Acid, Serum  Date Value Ref Range Status  11/18/2018 6.2 4.0 - 8.0 mg/dL Final    Comment:    Therapeutic target for gout patients: <6.0 mg/dL .           Passed - Cr in normal range and within 360 days    Creat  Date Value Ref Range Status  03/16/2020 1.13 0.60 - 1.35 mg/dL Final   Creatinine, Urine  Date Value  Ref Range Status  02/24/2019 108 20 - 320 mg/dL Final          Passed - Valid encounter within last 12 months    Recent Outpatient Visits           2 weeks ago Diabetes mellitus type 2 in obese [facility])   [facility] [place], Drue Stager, MD   3 months ago Diabetes mellitus type 2 in obese [facility])   [facility] [place], Drue Stager, MD   6 months ago Diabetes mellitus type 2 in obese [facility])   [facility] [name], MD   9 months ago Chronic midline low back pain with bilateral sciatica   [name], MD   1 year ago Diabetes mellitus type 2 in obese [facility])   [facility] [name], MD       Future Appointments             In 2 months Ancil Boozer, Drue Stager, MD [facility], [facility]

## 2020-03-31 NOTE — Telephone Encounter (Signed)
Requested Prescriptions  Pending Prescriptions Disp Refills  . cyclobenzaprine (FLEXERIL) 10 MG tablet [Pharmacy Med Name: CYCLOBENZAPRINE 10MG  TABLETS] 90 tablet 1    Sig: TAKE 1 TABLET(10 MG) BY MOUTH AT BEDTIME     Not Delegated - Analgesics:  Muscle Relaxants Failed - 03/31/2020  7:34 AM      Failed - This refill cannot be delegated      Passed - Valid encounter within last 6 months    Recent Outpatient Visits          2 weeks ago Diabetes mellitus type 2 in obese [facility])   [facility] [name], MD   3 months ago Diabetes mellitus type 2 in obese [facility])   [facility] [place], Drue Stager, MD   6 months ago Diabetes mellitus type 2 in obese [facility])   [facility] [name], MD   9 months ago Chronic midline low back pain with bilateral sciatica   [name], MD   1 year ago Diabetes mellitus type 2 in obese [facility])   [name], MD      Future Appointments            In 2 months Ancil Boozer, Drue Stager, MD [facility], [place]           . allopurinol (ZYLOPRIM) 100 MG tablet [Pharmacy Med Name: ALLOPURINOL 100MG  TABLETS] 90 tablet 1    Sig: TAKE 1 TABLET(100 MG) BY MOUTH DAILY     Endocrinology:  Gout Agents Failed - 03/31/2020  7:34 AM      Failed - Uric Acid in normal range and within 360 days    Uric Acid, Serum  Date Value Ref Range Status  11/18/2018 6.2 4.0 - 8.0 mg/dL Final    Comment:    Therapeutic target for gout patients: <6.0 mg/dL .          Passed - Cr in normal range and within 360 days    Creat  Date Value Ref Range Status  03/16/2020 1.13 0.60 - 1.35 mg/dL Final   Creatinine, Urine  Date Value Ref Range Status  02/24/2019 108 20 - 320 mg/dL Final         Passed - Valid encounter within last 12 months    Recent Outpatient Visits          2 weeks ago Diabetes mellitus type 2 in obese [facility])   [facility], Drue Stager, MD   3 months ago Diabetes mellitus type 2 in obese [facility])   [facility] [place], Drue Stager, MD   6 months ago Diabetes mellitus type 2 in obese [facility])   [facility] [name], MD   9 months ago Chronic midline low back pain with bilateral sciatica   [facility] [name], MD   1 year ago Diabetes mellitus type 2 in obese [facility])   [facility] [name], MD      Future Appointments            In 2 months Ancil Boozer, Drue Stager, MD [facility], [facility]

## 2020-04-10 ENCOUNTER — Ambulatory Visit: Payer: Self-pay | Attending: Internal Medicine

## 2020-04-10 DIAGNOSIS — Z23 Encounter for immunization: Secondary | ICD-10-CM

## 2020-04-10 NOTE — Progress Notes (Signed)
   Covid-19 Vaccination Clinic  Name:  [name]    MRN: [id number] DOB: 1971/01/08  04/10/2020  [name] was observed post Covid-19 immunization for 15 minutes without incident. He was provided with Vaccine Information Sheet and instruction to access the V-Safe system.   [name] was instructed to call 911 with any severe reactions post vaccine: Marland Kitchen Difficulty breathing  . Swelling of face and throat  . A fast heartbeat  . A bad rash all over body  . Dizziness and weakness   Immunizations Administered    Name Date Dose VIS Date Route   Pfizer COVID-19 Vaccine 04/10/2020  4:46 PM 0.3 mL 02/16/2019 Intramuscular   Manufacturer: [place]   Lot: [id number]   [place]: [id number]

## 2020-04-17 ENCOUNTER — Telehealth: Payer: Self-pay | Admitting: Family Medicine

## 2020-04-17 NOTE — Telephone Encounter (Signed)
Left message for patient to schedule Annual Wellness Visit.  Please schedule with Nurse Health Advisor Victoria Britt, RN at [place]  

## 2020-04-28 ENCOUNTER — Telehealth: Payer: Self-pay | Admitting: Family Medicine

## 2020-04-28 NOTE — Telephone Encounter (Signed)
Left message for patient to schedule Annual Wellness Visit.  Please schedule with Nurse Health Advisor KASEY UTHUS, RN.   

## 2020-05-16 LAB — HM DIABETES EYE EXAM

## 2020-05-20 ENCOUNTER — Other Ambulatory Visit: Payer: Self-pay | Admitting: Family Medicine

## 2020-05-20 DIAGNOSIS — E669 Obesity, unspecified: Secondary | ICD-10-CM

## 2020-05-20 DIAGNOSIS — E1169 Type 2 diabetes mellitus with other specified complication: Secondary | ICD-10-CM

## 2020-05-20 DIAGNOSIS — G8929 Other chronic pain: Secondary | ICD-10-CM

## 2020-05-20 NOTE — Telephone Encounter (Signed)
Requested medication (s) are due for refill today: yes  Requested medication (s) are on the active medication list: yes  Last refill:  09/16/19  Future visit scheduled: yes  Notes to clinic:  not delegated to NT to RF   Requested Prescriptions  Pending Prescriptions Disp Refills   pregabalin (LYRICA) 225 MG capsule [Pharmacy Med Name: PREGABALIN 225MG  CAPSULES] 180 capsule     Sig: TAKE 1 CAPSULE BY MOUTH TWICE DAILY      Not Delegated - Neurology:  Anticonvulsants - Controlled Failed - 05/20/2020  8:44 AM      Failed - This refill cannot be delegated      Passed - Valid encounter within last 12 months    Recent Outpatient Visits           2 months ago Diabetes mellitus type 2 in obese [facility])   [facility] [place], Drue Stager, MD   5 months ago Diabetes mellitus type 2 in obese [facility])   [facility] [place], Drue Stager, MD   8 months ago Diabetes mellitus type 2 in obese [facility])   [facility] [place], Drue Stager, MD   11 months ago Chronic midline low back pain with bilateral sciatica   [facility] [name], MD   1 year ago Diabetes mellitus type 2 in obese [facility])   [name], MD       Future Appointments             In 1 month Ancil Boozer, Drue Stager, MD [facility]              pregabalin (LYRICA) 225 MG capsule [Pharmacy Med Name: PREGABALIN 225MG  CAPSULES] 180 capsule     Sig: TAKE 1 CAPSULE BY MOUTH TWICE DAILY      Not Delegated - Neurology:  Anticonvulsants - Controlled Failed - 05/20/2020  8:44 AM      Failed - This refill cannot be delegated      Passed - Valid encounter within last 12 months    Recent Outpatient Visits           2 months ago Diabetes mellitus type 2 in obese [facility])   [facility] [place], Drue Stager, MD   5 months ago Diabetes mellitus type 2 in obese [facility])   [facility]  [place], Drue Stager, MD   8 months ago Diabetes mellitus type 2 in obese [facility])   [facility] [place], Drue Stager, MD   11 months ago Chronic midline low back pain with bilateral sciatica   [facility] [place], Drue Stager, MD   1 year ago Diabetes mellitus type 2 in obese [facility])   [facility] [name], MD       Future Appointments             In 1 month [name], MD [facility]             Signed Prescriptions Disp Refills   atorvastatin (LIPITOR) 10 MG tablet 90 tablet 1    Sig: TAKE 1 TABLET(10 MG) BY MOUTH DAILY      Cardiovascular:  Antilipid - Statins Failed - 05/20/2020  8:44 AM      Failed - HDL in normal range and within 360 days    HDL Cholesterol  Date Value Ref Range Status  01/11/2015 26 (L) 40 - 60 mg/dL Final   HDL  Date Value  Ref Range Status  03/16/2020 35 (L) > OR = 40 mg/dL Final          Passed - Total Cholesterol in normal range and within 360 days    Cholesterol  Date Value Ref Range Status  03/16/2020 101 <200 mg/dL Final  01/11/2015 103 0 - 200 mg/dL Final          Passed - LDL in normal range and within 360 days    Ldl Cholesterol, Calc  Date Value Ref Range Status  01/11/2015 58 0 - 100 mg/dL Final   LDL Cholesterol (Calc)  Date Value Ref Range Status  03/16/2020 48 mg/dL (calc) Final    Comment:    Reference range: <100 . Desirable range <100 mg/dL for primary prevention;   <70 mg/dL for patients with CHD or diabetic patients  with > or = 2 CHD risk factors. Marland Kitchen LDL-C is now calculated using the Martin-Hopkins  calculation, which is a validated novel method providing  better accuracy than the Friedewald equation in the  estimation of LDL-C.  Cresenciano Genre et al. Annamaria Helling. [id number]): 2061-2068  (http://education.[employer].com/faq/FAQ164)           Passed - Triglycerides in normal range and within 360 days    Triglycerides  Date Value Ref  Range Status  03/16/2020 98 <150 mg/dL Final  01/11/2015 97 0 - 200 mg/dL Final          Passed - Patient is not pregnant      Passed - Valid encounter within last 12 months    Recent Outpatient Visits           2 months ago Diabetes mellitus type 2 in obese [facility])   [facility] [place], Drue Stager, MD   5 months ago Diabetes mellitus type 2 in obese [facility])   [facility] [place], Drue Stager, MD   8 months ago Diabetes mellitus type 2 in obese [facility])   [facility] [name], MD   11 months ago Chronic midline low back pain with bilateral sciatica   [facility] [name], MD   1 year ago Diabetes mellitus type 2 in obese [facility])   [name], MD       Future Appointments             In 1 month Ancil Boozer, Drue Stager, MD [facility], [facility]

## 2020-05-20 NOTE — Telephone Encounter (Signed)
Requested Prescriptions  Pending Prescriptions Disp Refills  . atorvastatin (LIPITOR) 10 MG tablet [Pharmacy Med Name: ATORVASTATIN 10MG  TABLETS] 90 tablet 1    Sig: TAKE 1 TABLET(10 MG) BY MOUTH DAILY     Cardiovascular:  Antilipid - Statins Failed - 05/20/2020  8:44 AM      Failed - HDL in normal range and within 360 days    HDL Cholesterol  Date Value Ref Range Status  01/11/2015 26 (L) 40 - 60 mg/dL Final   HDL  Date Value Ref Range Status  03/16/2020 35 (L) > OR = 40 mg/dL Final         Passed - Total Cholesterol in normal range and within 360 days    Cholesterol  Date Value Ref Range Status  03/16/2020 101 <200 mg/dL Final  01/11/2015 103 0 - 200 mg/dL Final         Passed - LDL in normal range and within 360 days    Ldl Cholesterol, Calc  Date Value Ref Range Status  01/11/2015 58 0 - 100 mg/dL Final   LDL Cholesterol (Calc)  Date Value Ref Range Status  03/16/2020 48 mg/dL (calc) Final    Comment:    Reference range: <100 . Desirable range <100 mg/dL for primary prevention;   <70 mg/dL for patients with CHD or diabetic patients  with > or = 2 CHD risk factors. Marland Kitchen LDL-C is now calculated using the Martin-Hopkins  calculation, which is a validated novel method providing  better accuracy than the Friedewald equation in the  estimation of LDL-C.  Cresenciano Genre et al. Annamaria Helling. [id number]): 2061-2068  (http://education.[facility].com/faq/FAQ164)          Passed - Triglycerides in normal range and within 360 days    Triglycerides  Date Value Ref Range Status  03/16/2020 98 <150 mg/dL Final  01/11/2015 97 0 - 200 mg/dL Final         Passed - Patient is not pregnant      Passed - Valid encounter within last 12 months    Recent Outpatient Visits          2 months ago Diabetes mellitus type 2 in obese [facility])   [facility] [place], Drue Stager, MD   5 months ago Diabetes mellitus type 2 in obese [facility])   [facility] [place],  Drue Stager, MD   8 months ago Diabetes mellitus type 2 in obese [facility])   [facility] [name], MD   11 months ago Chronic midline low back pain with bilateral sciatica   [name], MD   1 year ago Diabetes mellitus type 2 in obese [facility])   [name], MD      Future Appointments            In 1 month Ancil Boozer, Drue Stager, MD [facility] [place]           . pregabalin (LYRICA) 225 MG capsule [Pharmacy Med Name: PREGABALIN 225MG  CAPSULES] 180 capsule     Sig: TAKE 1 CAPSULE BY MOUTH TWICE DAILY     Not Delegated - Neurology:  Anticonvulsants - Controlled Failed - 05/20/2020  8:44 AM      Failed - This refill cannot be delegated      Passed - Valid encounter within last 12 months    Recent Outpatient Visits          2 months ago Diabetes mellitus type 2 in  obese [facility])   [facility] [place], Drue Stager, MD   5 months ago Diabetes mellitus type 2 in obese [facility])   [facility] [place], Drue Stager, MD   8 months ago Diabetes mellitus type 2 in obese [facility])   [facility] [name], MD   11 months ago Chronic midline low back pain with bilateral sciatica   [name], MD   1 year ago Diabetes mellitus type 2 in obese [facility])   [facility] [name], MD      Future Appointments            In 1 month Ancil Boozer, Drue Stager, MD [facility], [place]           . pregabalin (LYRICA) 225 MG capsule [Pharmacy Med Name: PREGABALIN 225MG  CAPSULES] 180 capsule     Sig: TAKE 1 CAPSULE BY MOUTH TWICE DAILY     Not Delegated - Neurology:  Anticonvulsants - Controlled Failed - 05/20/2020  8:44 AM      Failed - This refill cannot be delegated      Passed - Valid encounter within last 12 months    Recent Outpatient Visits          2 months ago Diabetes  mellitus type 2 in obese [facility])   [facility] [place], Drue Stager, MD   5 months ago Diabetes mellitus type 2 in obese [facility])   [facility] [place], Drue Stager, MD   8 months ago Diabetes mellitus type 2 in obese [facility])   [facility] [name], MD   11 months ago Chronic midline low back pain with bilateral sciatica   [facility] [name], MD   1 year ago Diabetes mellitus type 2 in obese [facility])   [facility] [name], MD      Future Appointments            In 1 month Ancil Boozer, Drue Stager, MD [facility]

## 2020-05-23 NOTE — Telephone Encounter (Signed)
Requested medication (s) are due for refill today: no  Requested medication (s) are on the active medication list: yes  Last refill:  02/20/2020  Future visit scheduled: yes  Notes to clinic:  this refill cannot be delegated    Requested Prescriptions  Pending Prescriptions Disp Refills   pregabalin (LYRICA) 225 MG capsule [Pharmacy Med Name: PREGABALIN 225MG  CAPSULES] 180 capsule     Sig: TAKE 1 CAPSULE BY MOUTH TWICE DAILY      Not Delegated - Neurology:  Anticonvulsants - Controlled Failed - 05/23/2020  9:57 AM      Failed - This refill cannot be delegated      Passed - Valid encounter within last 12 months    Recent Outpatient Visits           2 months ago Diabetes mellitus type 2 in obese [facility])   [facility] [name], MD   5 months ago Diabetes mellitus type 2 in obese [facility])   [name], MD   8 months ago Diabetes mellitus type 2 in obese [facility])   [facility] [name], MD   11 months ago Chronic midline low back pain with bilateral sciatica   [facility] [name], MD   1 year ago Diabetes mellitus type 2 in obese [facility])   [name], MD       Future Appointments             In 4 weeks [name], MD [facility]             Signed Prescriptions Disp Refills   atorvastatin (LIPITOR) 10 MG tablet 90 tablet 1    Sig: TAKE 1 TABLET(10 MG) BY MOUTH DAILY      Cardiovascular:  Antilipid - Statins Failed - 05/20/2020  8:44 AM      Failed - HDL in normal range and within 360 days    HDL Cholesterol  Date Value Ref Range Status  01/11/2015 26 (L) 40 - 60 mg/dL Final   HDL  Date Value Ref Range Status  03/16/2020 35 (L) > OR = 40 mg/dL Final          Passed - Total Cholesterol in normal range and within 360 days    Cholesterol  Date Value Ref Range Status  03/16/2020 101 <200  mg/dL Final  01/11/2015 103 0 - 200 mg/dL Final          Passed - LDL in normal range and within 360 days    Ldl Cholesterol, Calc  Date Value Ref Range Status  01/11/2015 58 0 - 100 mg/dL Final   LDL Cholesterol (Calc)  Date Value Ref Range Status  03/16/2020 48 mg/dL (calc) Final    Comment:    Reference range: <100 . Desirable range <100 mg/dL for primary prevention;   <70 mg/dL for patients with CHD or diabetic patients  with > or = 2 CHD risk factors. Marland Kitchen LDL-C is now calculated using the Martin-Hopkins  calculation, which is a validated novel method providing  better accuracy than the Friedewald equation in the  estimation of LDL-C.  Cresenciano Genre et al. Annamaria Helling. [id number]): 2061-2068  (http://education.[facility].com/faq/FAQ164)           Passed - Triglycerides in normal range and within 360 days    Triglycerides  Date Value Ref Range Status  03/16/2020 98 <150 mg/dL Final  01/11/2015 97 0 - 200  mg/dL Final          Passed - Patient is not pregnant      Passed - Valid encounter within last 12 months    Recent Outpatient Visits           2 months ago Diabetes mellitus type 2 in obese [facility])   [facility] [place], Drue Stager, MD   5 months ago Diabetes mellitus type 2 in obese [facility])   [facility] [place], Drue Stager, MD   8 months ago Diabetes mellitus type 2 in obese [facility])   [facility] [name], MD   11 months ago Chronic midline low back pain with bilateral sciatica   [name], MD   1 year ago Diabetes mellitus type 2 in obese [facility])   [facility] [name], MD       Future Appointments             In 4 weeks [name], MD [facility], [facility]

## 2020-05-23 NOTE — Telephone Encounter (Signed)
pregabalin (LYRICA) 225 MG capsule  Pt wife wanting CB on this if not refilled, states out tonite, RX says submitted 3 times wife states and has withdrawal symptoms . [facility] [place], [place] [facility] Phone:  [phone]  Fax:  [phone]

## 2020-06-20 ENCOUNTER — Other Ambulatory Visit: Payer: Self-pay

## 2020-06-20 ENCOUNTER — Encounter: Payer: Self-pay | Admitting: Family Medicine

## 2020-06-20 ENCOUNTER — Ambulatory Visit (INDEPENDENT_AMBULATORY_CARE_PROVIDER_SITE_OTHER): Payer: No Typology Code available for payment source | Admitting: Family Medicine

## 2020-06-20 VITALS — BP 130/90 | HR 64 | Temp 96.8°F | Resp 16 | Ht 69.0 in | Wt 324.8 lb

## 2020-06-20 DIAGNOSIS — E538 Deficiency of other specified B group vitamins: Secondary | ICD-10-CM

## 2020-06-20 DIAGNOSIS — M5441 Lumbago with sciatica, right side: Secondary | ICD-10-CM

## 2020-06-20 DIAGNOSIS — E039 Hypothyroidism, unspecified: Secondary | ICD-10-CM | POA: Diagnosis not present

## 2020-06-20 DIAGNOSIS — E669 Obesity, unspecified: Secondary | ICD-10-CM | POA: Diagnosis not present

## 2020-06-20 DIAGNOSIS — M5442 Lumbago with sciatica, left side: Secondary | ICD-10-CM

## 2020-06-20 DIAGNOSIS — M1712 Unilateral primary osteoarthritis, left knee: Secondary | ICD-10-CM

## 2020-06-20 DIAGNOSIS — I10 Essential (primary) hypertension: Secondary | ICD-10-CM

## 2020-06-20 DIAGNOSIS — E559 Vitamin D deficiency, unspecified: Secondary | ICD-10-CM | POA: Diagnosis not present

## 2020-06-20 DIAGNOSIS — M109 Gout, unspecified: Secondary | ICD-10-CM

## 2020-06-20 DIAGNOSIS — G4709 Other insomnia: Secondary | ICD-10-CM

## 2020-06-20 DIAGNOSIS — E1169 Type 2 diabetes mellitus with other specified complication: Secondary | ICD-10-CM | POA: Diagnosis not present

## 2020-06-20 DIAGNOSIS — Z9884 Bariatric surgery status: Secondary | ICD-10-CM

## 2020-06-20 DIAGNOSIS — E785 Hyperlipidemia, unspecified: Secondary | ICD-10-CM

## 2020-06-20 DIAGNOSIS — G8929 Other chronic pain: Secondary | ICD-10-CM

## 2020-06-20 LAB — POCT GLYCOSYLATED HEMOGLOBIN (HGB A1C): Hemoglobin A1C: 7 % — AB (ref 4.0–5.6)

## 2020-06-20 MED ORDER — HYDROCODONE-ACETAMINOPHEN 7.5-325 MG PO TABS: 1.0000 | ORAL_TABLET | Freq: Three times a day (TID) | ORAL | 0 refills | Status: DC | PRN

## 2020-06-20 MED ORDER — METFORMIN HCL ER 750 MG PO TB24: 750.0000 mg | ORAL_TABLET | Freq: Every day | ORAL | 0 refills | Status: DC

## 2020-06-20 MED ORDER — TRAZODONE HCL 100 MG PO TABS: ORAL_TABLET | ORAL | 0 refills | Status: DC

## 2020-06-20 MED ORDER — LOSARTAN POTASSIUM 50 MG PO TABS: 50.0000 mg | ORAL_TABLET | Freq: Every day | ORAL | 1 refills | Status: DC

## 2020-06-20 NOTE — Progress Notes (Signed)
Name: [name]   MRN: [id number]    DOB: [birth year]   Date:06/20/2020       Progress Note  Subjective  Chief Complaint  Chief Complaint  Patient presents with  . Diabetes  . Obesity    HPI  DMII: diet controlled since bariatric surgery10/2013,but hgbA1C is gradually increasingbut has been stable over the pastyear,from 6.2% up to  6.3% and today is 7 % .Denies polyphagia, polydipsia or polyuria. Eye examis up to date. His bp is going up and we will adjust dose of Losartan to 50 mg  He is eating at home, sometimes eats at work - but they grill outside. Avoiding sweets - but has a piece of candy a couple of times a day, his work is physical digging graves His wife also had bariatric surgery but initially was doing well with diet and meal prep but not as consistent lately   Muscle Cramps: he states symptoms have improved with B12, D, magnesiumand calcium supplementationbut still has cramps sometimes at night.He has been drinking 4-5 bottles of water daily.He states symptoms are getting worse because of the heat, he is sweating more. He has added some Propel and Gatorade zero   Hypothyroidism: taking same dose of Synthroid for many months, last TSH at goal. He has chronic dry skin, no hair loss, no change in bowel movement or palpitation   Chronic low back pain : he is taking Norco 7.5/325mg  mostly twice daily and is getting 70 pills for now until winter time when we go up to 100  per month. Taking Lyricaand it seems to help with pain.No longer under disability , working full time and has a physical job digging graves,he stopped taking good[employer]-pow[employer]Pain contract singed03/2021 Pain level right now is5/10 on lower back and both legs  Neuropathy: he has bilateral daily pain on both legs, secondary to chronic back problems, aching like, he denies burning or prickly sensation, Lyrica helps.Current pain of3/10.Unchanged   History of bariatric surgery/Morbid obesity  : surgery 09/2012 was he is morbidly obesehis weight at the time was 396 lbs- lowest weight after surgery was 275 lbs,he gradually gained weight back, today it is 324.8 lbs He is drinking more water now. His job is very active, he worked 126 hours in the past 2 weeks digging graves, however seems like his diet may not be as restrictive lately  GUY:QIHK taking low dose losartan, no chest pain, palpitation or dizziness. BP is trending up and we will adjust dose  Controlled gout; taking allopurinol , no recent episodes  AR: doing well    Patient Active Problem List   Diagnosis Date Noted  . Chronic midline low back pain with sciatica 02/11/2017  . Anemia, unspecified 06/12/2016  . B12 deficiency 06/11/2016  . Allergic conjunctivitis 06/11/2016  . Hypocalcemia 06/11/2016  . Muscle cramps 06/11/2016  . Hypertension, benign 11/03/2015  . Dyslipidemia 11/02/2015  . Acquired nystagmus 08/01/2015  . Chronic back pain 06/29/2015  . Carpal tunnel syndrome 06/29/2015  . Osteoarthritis 06/29/2015  . Claustrophobia 06/29/2015  . Foot drop, left 06/29/2015  . Hiatal hernia 06/29/2015  . H/O diabetes mellitus 06/29/2015  . H/O testicular cancer 06/29/2015  . Acquired hypothyroidism 06/29/2015  . Dysmetabolic syndrome 74/25/9563  . Morbid obesity ([place]) 06/29/2015  . Allergic rhinitis 06/29/2015  . Neuropathy 06/29/2015  . Seborrheic keratoses 06/29/2015  . Vitamin D deficiency 06/29/2015  . Bariatric surgery status 02/20/2012    Past Surgical History:  Procedure Laterality Date  . BACK SURGERY  X 2  . HERNIA REPAIR     left side and umbilical hernia repair  . 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 URETEROTOMY Left 2000    Family History  Problem Relation Age of Onset  . Coronary artery disease Mother   . Cancer Father        Melanoma and Testicular  . Diabetes Father     Social History   Tobacco Use  . Smoking status: Former Smoker    Packs/day: 3.00    Years:  8.00    Pack years: 24.00    Types: Cigarettes, Cigars, Pipe    Start date: 06/28/1984    Quit date: 12/24/1991    Years since quitting: 28.5  . Smokeless tobacco: Former Systems developer    Types: Chew    Quit date: 12/24/1991  Substance Use Topics  . Alcohol use: No    Alcohol/week: 0.0 standard drinks     Current Outpatient Medications:  .  allopurinol (ZYLOPRIM) 100 MG tablet, TAKE 1 TABLET(100 MG) BY MOUTH DAILY, Disp: 90 tablet, Rfl: 1 .  ammonium lactate (LAC-HYDRIN) 12 % cream, Apply topically as needed for dry skin., Disp: 385 g, Rfl: 0 .  atorvastatin (LIPITOR) 10 MG tablet, TAKE 1 TABLET(10 MG) BY MOUTH DAILY, Disp: 90 tablet, Rfl: 1 .  Calcium Carbonate-Vit D-Min (CALTRATE 600+D PLUS MINERALS) 600-800 MG-UNIT CHEW, Chew 1 tablet by mouth daily., Disp: 180 tablet, Rfl: 1 .  Cyanocobalamin (VITAMIN B-12) 500 MCG SUBL, Place 1 tablet (500 mcg total) under the tongue daily., Disp: 90 tablet, Rfl: 3 .  cyclobenzaprine (FLEXERIL) 10 MG tablet, TAKE 1 TABLET(10 MG) BY MOUTH AT BEDTIME, Disp: 90 tablet, Rfl: 1 .  diclofenac sodium (VOLTAREN) 1 % GEL, APPLY 4 GRAMS TOPICALLY FOUR TIMES DAILY TO KNEE, Disp: 100 g, Rfl: 1 .  ferrous sulfate 325 (65 FE) MG tablet, Take 1 tablet (325 mg total) by mouth daily with breakfast., Disp: 90 tablet, Rfl: 3 .  fexofenadine (ALLEGRA) 180 MG tablet, TAKE 1 TABLET(180 MG TOTAL) BY MOUTH DAILY, Disp: 90 tablet, Rfl: 3 .  Homeopathic Products ([employer] LEG CRAMPS PAIN RELIEF PO), Take 2 tablets by mouth 2 (two) times daily at 10 am and 4 pm., Disp: , Rfl:  .  HYDROcodone-acetaminophen (NORCO) 7.5-325 MG tablet, Take 1 tablet by mouth 3 (three) times daily as needed. Fill May 23 rd, 2021, Disp: 70 tablet, Rfl: 0 .  HYDROcodone-acetaminophen (NORCO) 7.5-325 MG tablet, Take 1 tablet by mouth 3 (three) times daily as needed for moderate pain. Fill April 24 th, 2021, Disp: 70 tablet, Rfl: 0 .  HYDROcodone-acetaminophen (NORCO) 7.5-325 MG tablet, Take 1 tablet by mouth 3 (three)  times daily as needed for moderate pain., Disp: 70 tablet, Rfl: 0 .  ketoconazole (NIZORAL) 2 % cream, Apply 1 application topically daily., Disp: 60 g, Rfl: 1 .  losartan (COZAAR) 25 MG tablet, Take 1 tablet (25 mg total) by mouth daily., Disp: 90 tablet, Rfl: 1 .  Magnesium Oxide 500 MG TABS, Take 1 tablet (500 mg total) by mouth daily., Disp: 90 tablet, Rfl: 0 .  Melatonin-Pyridoxine (MELATONEX PO), Take by mouth., Disp: , Rfl:  .  multivitamin-iron-minerals-folic acid (CENTRUM) chewable tablet, Chew 1 tablet by mouth daily., Disp: 90 tablet, Rfl: 3 .  Potassium 99 MG TABS, Take by mouth., Disp: , Rfl:  .  pregabalin (LYRICA) 225 MG capsule, TAKE 1 CAPSULE BY MOUTH TWICE DAILY, Disp: 180 capsule, Rfl: 1 .  SYNTHROID 137 MCG tablet, TAKE 1 TABLET  BY MOUTH EVERY DAY AND 1 AND 1/2 ON SUNDAYS, Disp: 90 tablet, Rfl: 1 .  traZODone (DESYREL) 100 MG tablet, TAKE 1 AND 1/2 TABLETS(150 MG) BY MOUTH AT BEDTIME, Disp: 135 tablet, Rfl: 0  Allergies  Allergen Reactions  . Morphine Itching    I personally reviewed active problem list, medication list, allergies, family history, social history, health maintenance with the patient/caregiver today.   ROS  Constitutional: Negative for fever or weight change.  Respiratory: Negative for cough and shortness of breath.   Cardiovascular: Negative for chest pain or palpitations.  Gastrointestinal: Negative for abdominal pain, no bowel changes.  Musculoskeletal: Negative for gait problem or joint swelling.  Skin: Negative for rash.  Neurological: Negative for dizziness or headache.  No other specific complaints in a complete review of systems (except as listed in HPI above).  Objective  Vitals:   06/20/20 0752  BP: 130/90  Pulse: 64  Resp: 16  Temp: (!) 96.8 F (36 C)  TempSrc: Temporal  SpO2: 95%  Weight: (!) 324 lb 12.8 oz (147.3 kg)  Height: 5\' 9"  (1.753 m)    Body mass index is 47.96 kg/m.  Physical Exam  Constitutional: Patient  appears well-developed and well-nourished. Obese No distress.  HEENT: head atraumatic, normocephalic, pupils equal and reactive to light,  neck supple Cardiovascular: Normal rate, regular rhythm and normal heart sounds.  No murmur heard. No BLE edema. Pulmonary/Chest: Effort normal and breath sounds normal. No respiratory distress. Abdominal: Soft.  There is no tenderness. Psychiatric: Patient has a normal mood and affect. behavior is normal. Judgment and thought content normal.  Recent Results (from the past 2160 hour(s))  HM DIABETES EYE EXAM     Status: None   Collection Time: 05/16/20 12:00 AM  Result Value Ref Range   HM Diabetic Eye Exam No Retinopathy No Retinopathy  POCT HgB A1C     Status: Abnormal   Collection Time: 06/20/20  8:01 AM  Result Value Ref Range   Hemoglobin A1C 7.0 (A) 4.0 - 5.6 %   HbA1c POC (<> result, manual entry)     HbA1c, POC (prediabetic range)     HbA1c, POC (controlled diabetic range)        PHQ2/9: Depression screen [facility] 2/9 06/20/2020 03/16/2020 12/16/2019 09/16/2019 06/18/2019  Decreased Interest 0 0 0 0 0  Down, Depressed, Hopeless 0 0 0 0 0  PHQ - 2 Score 0 0 0 0 0  Altered sleeping 0 0 0 0 0  Tired, decreased energy 0 0 0 0 0  Change in appetite 0 0 0 0 0  Feeling bad or failure about yourself  0 0 0 0 0  Trouble concentrating 0 0 0 0 0  Moving slowly or fidgety/restless 0 0 0 0 0  Suicidal thoughts 0 0 0 0 0  PHQ-9 Score 0 0 0 0 0  Difficult doing work/chores - Not difficult at all - Not difficult at all -  Some recent data might be hidden    phq 9 is negative   Fall Risk: Fall Risk  06/20/2020 03/16/2020 12/16/2019 09/16/2019 06/18/2019  Falls in the past year? 0 0 0 0 0  Number falls in past yr: 0 0 0 0 0  Injury with Fall? 0 0 0 0 0  Follow up - Falls evaluation completed - - -      Assessment & Plan  1. Diabetes mellitus type 2 in obese (HCC)  - POCT HgB A1C - losartan (COZAAR) 50 MG  tablet; Take 1 tablet (50 mg total) by  mouth daily.  Dispense: 90 tablet; Refill: 1  2. Vitamin D deficiency  Continue supplementation   3. Acquired hypothyroidism  Compliant   4. B12 deficiency  Continue supplementation   5. Dyslipidemia  On statin therapy   6. Morbid obesity ([place])  Discussed with the patient the risk posed by an increased BMI. Discussed importance of portion control, calorie counting and at least 150 minutes of physical activity weekly. Avoid sweet beverages and drink more water. Eat at least 6 servings of fruit and vegetables daily   7. Hypertension, benign  We will increase dose of losartan   8. Chronic midline low back pain with bilateral sciatica  - HYDROcodone-acetaminophen (NORCO) 7.5-325 MG tablet; Take 1 tablet by mouth 3 (three) times daily as needed. Fill August 26 th, 2021  Dispense: 70 tablet; Refill: 0 - HYDROcodone-acetaminophen (NORCO) 7.5-325 MG tablet; Take 1 tablet by mouth 3 (three) times daily as needed for moderate pain. July 28 th, 2021  Dispense: 70 tablet; Refill: 0 - HYDROcodone-acetaminophen (NORCO) 7.5-325 MG tablet; Take 1 tablet by mouth 3 (three) times daily as needed for moderate pain.  Dispense: 70 tablet; Refill: 0  9. Controlled gout  Unchanged   10. Bariatric surgery status  Needs to try losing weight again   11. Primary osteoarthritis of left knee  Stable   12. Other insomnia  - traZODone (DESYREL) 100 MG tablet; TAKE 1 AND 1/2 TABLETS(150 MG) BY MOUTH AT BEDTIME  Dispense: 135 tablet; Refill: 0  13. Chronic nonmalignant pain  - HYDROcodone-acetaminophen (NORCO) 7.5-325 MG tablet; Take 1 tablet by mouth 3 (three) times daily as needed. Fill August 26 th, 2021  Dispense: 70 tablet; Refill: 0 - HYDROcodone-acetaminophen (NORCO) 7.5-325 MG tablet; Take 1 tablet by mouth 3 (three) times daily as needed for moderate pain. July 28 th, 2021  Dispense: 70 tablet; Refill: 0 - HYDROcodone-acetaminophen (NORCO) 7.5-325 MG tablet; Take 1 tablet by mouth 3  (three) times daily as needed for moderate pain.  Dispense: 70 tablet; Refill: 0

## 2020-07-10 ENCOUNTER — Telehealth: Payer: Self-pay | Admitting: Family Medicine

## 2020-07-10 ENCOUNTER — Telehealth: Payer: Self-pay

## 2020-07-10 ENCOUNTER — Other Ambulatory Visit: Payer: Self-pay | Admitting: Family Medicine

## 2020-07-10 DIAGNOSIS — M1712 Unilateral primary osteoarthritis, left knee: Secondary | ICD-10-CM

## 2020-07-10 NOTE — Telephone Encounter (Signed)
Pt called about re filling lexapro / please call and advise asap

## 2020-07-11 ENCOUNTER — Other Ambulatory Visit: Payer: Self-pay

## 2020-07-11 NOTE — Telephone Encounter (Signed)
Appointment scheduled for 07/17/2020 at 10:20am.

## 2020-07-17 ENCOUNTER — Other Ambulatory Visit: Payer: Self-pay

## 2020-07-17 ENCOUNTER — Encounter: Payer: Self-pay | Admitting: Family Medicine

## 2020-07-17 ENCOUNTER — Ambulatory Visit (INDEPENDENT_AMBULATORY_CARE_PROVIDER_SITE_OTHER): Payer: No Typology Code available for payment source | Admitting: Family Medicine

## 2020-07-17 VITALS — BP 140/100 | HR 80 | Temp 97.1°F | Resp 16 | Ht 69.0 in | Wt 327.4 lb

## 2020-07-17 DIAGNOSIS — I10 Essential (primary) hypertension: Secondary | ICD-10-CM | POA: Diagnosis not present

## 2020-07-17 DIAGNOSIS — F341 Dysthymic disorder: Secondary | ICD-10-CM

## 2020-07-17 DIAGNOSIS — F419 Anxiety disorder, unspecified: Secondary | ICD-10-CM | POA: Diagnosis not present

## 2020-07-17 MED ORDER — ESCITALOPRAM OXALATE 10 MG PO TABS: 10.0000 mg | ORAL_TABLET | Freq: Every day | ORAL | 0 refills | Status: DC

## 2020-07-17 MED ORDER — ARIPIPRAZOLE 2 MG PO TABS: 2.0000 mg | ORAL_TABLET | Freq: Every day | ORAL | 0 refills | Status: DC
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
## 2020-08-23 ENCOUNTER — Other Ambulatory Visit: Payer: Self-pay | Admitting: Family Medicine

## 2020-08-23 DIAGNOSIS — F419 Anxiety disorder, unspecified: Secondary | ICD-10-CM

## 2020-08-23 DIAGNOSIS — F341 Dysthymic disorder: Secondary | ICD-10-CM
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
## 2020-08-23 NOTE — Telephone Encounter (Signed)
Requested Prescriptions  Pending Prescriptions Disp Refills  . escitalopram (LEXAPRO) 10 MG tablet [Pharmacy Med Name: ESCITALOPRAM 10MG  TABLETS] 30 tablet 0    Sig: TAKE 1 TABLET(10 MG) BY MOUTH DAILY     Psychiatry:  Antidepressants - SSRI Passed - 08/23/2020  4:55 PM      Passed - Valid encounter within last 6 months    Recent Outpatient Visits          1 month ago [facility] [place], Drue Stager, MD   2 months ago Diabetes mellitus type 2 in obese [facility])   [facility] [place], Drue Stager, MD   5 months ago Diabetes mellitus type 2 in obese [facility])   [facility] [name], MD   8 months ago Diabetes mellitus type 2 in obese [facility])   [name], MD   11 months ago Diabetes mellitus type 2 in obese [facility])   [facility] [name], MD      Future Appointments            Tomorrow [name], MD [facility], [place]   In 1 month [name], MD [facility], [place]           . ARIPiprazole (ABILIFY) 2 MG tablet [Pharmacy Med Name: ARIPIPRAZOLE 2 MG TABLETS] 60 tablet 0    Sig: TAKE 1 TO 2 TABLETS(2 TO 4 MG) BY MOUTH DAILY     Not Delegated - Psychiatry:  Antipsychotics - Second Generation (Atypical) - aripiprazole Failed - 08/23/2020  4:55 PM      Failed - This refill cannot be delegated      Passed - Valid encounter within last 6 months    Recent Outpatient Visits          1 month ago [facility] [place], Drue Stager, MD   2 months ago Diabetes mellitus type 2 in obese [facility])   [facility] [place], Drue Stager, MD   5 months ago Diabetes mellitus type 2 in obese [facility])   [facility] [place], Drue Stager, MD   8 months ago Diabetes mellitus type 2 in obese [facility])   [name], MD   11 months ago Diabetes mellitus type 2  in obese [facility])   [name], MD      Future Appointments            Tomorrow [name], MD [facility], [place]   In 1 month [name], MD [facility], [facility]

## 2020-08-23 NOTE — Telephone Encounter (Signed)
Requested medication (s) are due for refill today: yes  Requested medication (s) are on the active medication list:yes  Last refill:  07/17/20  #60  0 refills  Future visit scheduled:yes tomorrow  Notes to clinic:  Not delegated    Requested Prescriptions  Pending Prescriptions Disp Refills   ARIPiprazole (ABILIFY) 2 MG tablet [Pharmacy Med Name: ARIPIPRAZOLE 2 MG TABLETS] 60 tablet 0    Sig: TAKE 1 TO 2 TABLETS(2 TO 4 MG) BY MOUTH DAILY      Not Delegated - Psychiatry:  Antipsychotics - Second Generation (Atypical) - aripiprazole Failed - 08/23/2020  4:55 PM      Failed - This refill cannot be delegated      Passed - Valid encounter within last 6 months    Recent Outpatient Visits           1 month ago [facility] [place], Drue Stager, MD   2 months ago Diabetes mellitus type 2 in obese [facility])   [facility] [place], Drue Stager, MD   5 months ago Diabetes mellitus type 2 in obese [facility])   [facility] [place], Drue Stager, MD   8 months ago Diabetes mellitus type 2 in obese [facility])   [facility] [place], Drue Stager, MD   11 months ago Diabetes mellitus type 2 in obese [facility])   [facility] [name], MD       Future Appointments             Tomorrow [name], MD [facility], [place]   In 1 month [name], MD [facility], [facility]             Signed Prescriptions Disp Refills   escitalopram (LEXAPRO) 10 MG tablet 30 tablet 0    Sig: TAKE 1 TABLET(10 MG) BY MOUTH DAILY      Psychiatry:  Antidepressants - SSRI Passed - 08/23/2020  4:55 PM      Passed - Valid encounter within last 6 months    Recent Outpatient Visits           1 month ago [facility] [place], Drue Stager, MD   2 months ago Diabetes mellitus type 2 in obese [facility])   [facility] [place], Drue Stager, MD   5 months ago  Diabetes mellitus type 2 in obese [facility])   [facility] [place], Drue Stager, MD   8 months ago Diabetes mellitus type 2 in obese [facility])   [name], MD   11 months ago Diabetes mellitus type 2 in obese [facility])   [name], MD       Future Appointments             Tomorrow [name], MD [facility], [place]   In 1 month [name], MD [facility], [facility]

## 2020-08-24 ENCOUNTER — Ambulatory Visit: Payer: No Typology Code available for payment source | Admitting: Family Medicine

## 2020-08-30 ENCOUNTER — Other Ambulatory Visit: Payer: Self-pay

## 2020-09-05 ENCOUNTER — Other Ambulatory Visit: Payer: Self-pay

## 2020-09-05 DIAGNOSIS — E039 Hypothyroidism, unspecified: Secondary | ICD-10-CM

## 2020-09-05 MED ORDER — SYNTHROID 137 MCG PO TABS: ORAL_TABLET | ORAL | 1 refills | Status: DC

## 2020-09-16 ENCOUNTER — Other Ambulatory Visit: Payer: Self-pay | Admitting: Family Medicine

## 2020-09-16 DIAGNOSIS — M109 Gout, unspecified: Secondary | ICD-10-CM

## 2020-09-16 DIAGNOSIS — J302 Other seasonal allergic rhinitis: Secondary | ICD-10-CM

## 2020-09-16 DIAGNOSIS — J3089 Other allergic rhinitis: Secondary | ICD-10-CM

## 2020-09-16 DIAGNOSIS — R252 Cramp and spasm: Secondary | ICD-10-CM

## 2020-09-16 NOTE — Telephone Encounter (Signed)
Requested Prescriptions  Pending Prescriptions Disp Refills  . cyclobenzaprine (FLEXERIL) 10 MG tablet [Pharmacy Med Name: CYCLOBENZAPRINE 10MG TABLETS] 90 tablet 1    Sig: TAKE 1 TABLET(10 MG) BY MOUTH AT BEDTIME     Not Delegated - Analgesics:  Muscle Relaxants Failed - 09/16/2020  6:28 AM      Failed - This refill cannot be delegated      Passed - Valid encounter within last 6 months    Recent Outpatient Visits          2 months ago [name] MD   2 months ago Diabetes mellitus type 2 in obese [facility])   [facility] [place], Drue Stager, MD   6 months ago Diabetes mellitus type 2 in obese [facility])   [name], MD   9 months ago Diabetes mellitus type 2 in obese [facility]   [name], MD   1 year ago Diabetes mellitus type 2 in obese [facility])   [name], MD      Future Appointments            In 2 weeks [name], MD [facility], [place]           . allopurinol (ZYLOPRIM) 100 MG tablet [Pharmacy Med Name: ALLOPURINOL 100MG TABLETS] 90 tablet 1    Sig: TAKE 1 TABLET(100 MG) BY MOUTH DAILY     Endocrinology:  Gout Agents Failed - 09/16/2020  6:28 AM      Failed - Uric Acid in normal range and within 360 days    Uric Acid, Serum  Date Value Ref Range Status  11/18/2018 6.2 4.0 - 8.0 mg/dL Final    Comment:    Therapeutic target for gout patients: <6.0 mg/dL .          Passed - Cr in normal range and within 360 days    Creat  Date Value Ref Range Status  03/16/2020 1.13 0.60 - 1.35 mg/dL Final   Creatinine, Urine  Date Value Ref Range Status  02/24/2019 108 20 - 320 mg/dL Final         Passed - Valid encounter within last 12 months    Recent Outpatient Visits          2 months ago [facility] [place], Drue Stager, MD   2 months ago Diabetes  mellitus type 2 in obese [facility])   [facility] [place], Drue Stager, MD   6 months ago Diabetes mellitus type 2 in obese [facility])   [facility], Drue Stager, MD   9 months ago Diabetes mellitus type 2 in obese [facility])   [facility] [place], Drue Stager, MD   1 year ago Diabetes mellitus type 2 in obese [facility])   [facility] [name], MD      Future Appointments            In 2 weeks [name], MD [facility], [facility]           . metFORMIN (GLUCOPHAGE-XR) 750 MG 24 hr tablet [Pharmacy Med Name: METFORMIN ER 750MG 24HR TABS] 90 tablet 0    Sig: TAKE 1 TABLET(750 MG) BY MOUTH DAILY WITH BREAKFAST     Endocrinology:  Diabetes - Biguanides Passed - 09/16/2020  6:28 AM      Passed - Cr in normal range  and within 360 days    Creat  Date Value Ref Range Status  03/16/2020 1.13 0.60 - 1.35 mg/dL Final   Creatinine, Urine  Date Value Ref Range Status  02/24/2019 108 20 - 320 mg/dL Final         Passed - HBA1C is between 0 and 7.9 and within 180 days    Hemoglobin A1C  Date Value Ref Range Status  06/20/2020 7.0 (A) 4.0 - 5.6 % Final   HbA1c, POC (controlled diabetic range)  Date Value Ref Range Status  09/16/2019 6.2 0.0 - 7.0 % Final         Passed - eGFR in normal range and within 360 days    GFR, Est African American  Date Value Ref Range Status  03/16/2020 89 > OR = 60 mL/min/1.9m Final   GFR, Est Non African American  Date Value Ref Range Status  03/16/2020 76 > OR = 60 mL/min/1.776mFinal         Passed - Valid encounter within last 6 months    Recent Outpatient Visits          [facility]   2 months ago Diabetes mellitus type 2 in obese [facility]  [facility]   6 months ago Diabetes mellitus type 2 in obese ([facility]  [facility]    9 months ago Diabetes mellitus type 2 in obese ([facility]  [facility]izer[facility]   1 year ago Diabetes mellitus type 2 in obese ([facility]  [facility] SizerMD      Future Appointments            In 2 weeks SoSteele SizerMD [facility]         . fexofenadine (ALLEGRA) 180 MG tablet [Pharmacy Med Name: FEXOFENADINE 180MG TABLETS] 90 tablet 3    Sig: TAKE 1 TABLET(180 MG TOTAL) BY MOUTH DAILY     Ear, Nose, and Throat:  Antihistamines Passed - 09/16/2020  6:28 AM      Passed - Valid encounter within last 12 months    Recent Outpatient Visits          2 months ago [facility]   2 months ago Diabetes mellitus type 2 in obese ([facility]  [facility] StagerMD   6 months ago Diabetes mellitus type 2 in obese ([facility]  [facility] SizerMD   9 months ago Diabetes mellitus type 2 in obese ([facility]  [facility] SizerMD   1 year ago Diabetes mellitus type 2 in obese ([facility]  [facility] Cent[facility] SizerMD      Future Appointments            In 2 weeks SoSteele SizerMD [facility]

## 2020-09-16 NOTE — Telephone Encounter (Signed)
Requested medications are due for refill today?  Yes  Requested medications are on active medication list?  Yes  Last Refill:    Allopurinol   03/31/2020  # 90 with one refills - This medication failed Rx refill protocol due to no uric acid level in the past 360 days.  The last uric acid level was on 11/18/2018.    Cyclobenzaprine   04/01/2020  # 90 with one refill - This Refill cannot be delegated.    Future visit scheduled?  (yes or no)  Notes to Clinic:  Please see above.
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
## 2020-10-02 ENCOUNTER — Other Ambulatory Visit: Payer: Self-pay

## 2020-10-02 ENCOUNTER — Encounter: Payer: Self-pay | Admitting: Family Medicine

## 2020-10-02 ENCOUNTER — Ambulatory Visit (INDEPENDENT_AMBULATORY_CARE_PROVIDER_SITE_OTHER): Payer: No Typology Code available for payment source | Admitting: Family Medicine

## 2020-10-02 ENCOUNTER — Telehealth: Payer: Self-pay | Admitting: Family Medicine

## 2020-10-02 VITALS — BP 146/102 | HR 89 | Temp 98.1°F | Resp 20 | Ht 69.0 in | Wt 330.8 lb

## 2020-10-02 DIAGNOSIS — Z23 Encounter for immunization: Secondary | ICD-10-CM

## 2020-10-02 DIAGNOSIS — E1169 Type 2 diabetes mellitus with other specified complication: Secondary | ICD-10-CM

## 2020-10-02 DIAGNOSIS — E669 Obesity, unspecified: Secondary | ICD-10-CM

## 2020-10-02 DIAGNOSIS — E039 Hypothyroidism, unspecified: Secondary | ICD-10-CM | POA: Diagnosis not present

## 2020-10-02 DIAGNOSIS — Z1159 Encounter for screening for other viral diseases: Secondary | ICD-10-CM

## 2020-10-02 DIAGNOSIS — F341 Dysthymic disorder: Secondary | ICD-10-CM

## 2020-10-02 DIAGNOSIS — M5441 Lumbago with sciatica, right side: Secondary | ICD-10-CM

## 2020-10-02 DIAGNOSIS — F419 Anxiety disorder, unspecified: Secondary | ICD-10-CM

## 2020-10-02 DIAGNOSIS — M109 Gout, unspecified: Secondary | ICD-10-CM

## 2020-10-02 DIAGNOSIS — E538 Deficiency of other specified B group vitamins: Secondary | ICD-10-CM | POA: Diagnosis not present

## 2020-10-02 DIAGNOSIS — G4709 Other insomnia: Secondary | ICD-10-CM

## 2020-10-02 DIAGNOSIS — G8929 Other chronic pain: Secondary | ICD-10-CM

## 2020-10-02 DIAGNOSIS — I10 Essential (primary) hypertension: Secondary | ICD-10-CM

## 2020-10-02 DIAGNOSIS — G4733 Obstructive sleep apnea (adult) (pediatric): Secondary | ICD-10-CM

## 2020-10-02 DIAGNOSIS — E559 Vitamin D deficiency, unspecified: Secondary | ICD-10-CM

## 2020-10-02 DIAGNOSIS — M5442 Lumbago with sciatica, left side: Secondary | ICD-10-CM

## 2020-10-02 MED ORDER — NOVOFINE 30G X 8 MM MISC: 1.0000 | 0 refills | Status: DC | PRN

## 2020-10-02 MED ORDER — HYDROCODONE-ACETAMINOPHEN 7.5-325 MG PO TABS: 1.0000 | ORAL_TABLET | Freq: Three times a day (TID) | ORAL | 0 refills | Status: DC | PRN

## 2020-10-02 MED ORDER — ATORVASTATIN CALCIUM 10 MG PO TABS: ORAL_TABLET | ORAL | 1 refills | Status: DC

## 2020-10-02 MED ORDER — VALSARTAN 320 MG PO TABS: 320.0000 mg | ORAL_TABLET | Freq: Every day | ORAL | 0 refills | Status: DC

## 2020-10-02 MED ORDER — TRAZODONE HCL 100 MG PO TABS: ORAL_TABLET | ORAL | 0 refills | Status: DC

## 2020-10-02 MED ORDER — QUETIAPINE FUMARATE 25 MG PO TABS: 25.0000 mg | ORAL_TABLET | Freq: Every day | ORAL | 0 refills | Status: DC

## 2020-10-02 MED ORDER — ESCITALOPRAM OXALATE 10 MG PO TABS: 10.0000 mg | ORAL_TABLET | Freq: Every day | ORAL | 0 refills | Status: DC

## 2020-10-02 MED ORDER — NOVOFINE 30G X 8 MM MISC: 1.0000 | 0 refills | Status: DC

## 2020-10-02 MED ORDER — OZEMPIC (0.25 OR 0.5 MG/DOSE) 2 MG/1.5ML ~~LOC~~ SOPN: 0.5000 mg | PEN_INJECTOR | SUBCUTANEOUS | 0 refills | Status: DC

## 2020-10-02 NOTE — Telephone Encounter (Signed)
Wells Guiles calling from [facility] regarding Insulin Pen Needle (NOVOFINE) 30G X 8 MM MISC [[id number]] -   Sig inject 10 into the skin- wanting to be sure it is not read 1 needle at at time.  And needing a frequency. Once a day  Does the pharmacy have to use NOVOFINE or can they use any pen needle on hand.  Please advise [phone]- [phone]

## 2020-10-03 LAB — COMPLETE METABOLIC PANEL WITH GFR
AG Ratio: 2.4 (calc) (ref 1.0–2.5)
ALT: 12 U/L (ref 9–46)
AST: 13 U/L (ref 10–40)
Albumin: 4.3 g/dL (ref 3.6–5.1)
Alkaline phosphatase (APISO): 104 U/L (ref 36–130)
BUN: 19 mg/dL (ref 7–25)
CO2: 32 mmol/L (ref 20–32)
Calcium: 9.2 mg/dL (ref 8.6–10.3)
Chloride: 105 mmol/L (ref 98–110)
Creat: 1.21 mg/dL (ref 0.60–1.35)
GFR, Est African American: 82 mL/min/{1.73_m2} (ref >=60)
GFR, Est Non African American: 70 mL/min/{1.73_m2} (ref >=60)
Globulin: 1.8 g/dL (calc) — ABNORMAL LOW (ref 1.9–3.7)
Glucose, Bld: 139 mg/dL — ABNORMAL HIGH (ref 65–99)
Potassium: 4.4 mmol/L (ref 3.5–5.3)
Sodium: 143 mmol/L (ref 135–146)
Total Bilirubin: 0.6 mg/dL (ref 0.2–1.2)
Total Protein: 6.1 g/dL (ref 6.1–8.1)

## 2020-10-03 LAB — HEMOGLOBIN A1C
Hgb A1c MFr Bld: 6.9 % of total Hgb — ABNORMAL HIGH (ref <5.7)
Mean Plasma Glucose: 151 (calc)
eAG (mmol/L): 8.4 (calc)

## 2020-10-03 LAB — MICROALBUMIN / CREATININE URINE RATIO
Creatinine, Urine: 143 mg/dL (ref 20–320)
Microalb Creat Ratio: 6 mcg/mg creat (ref <30)
Microalb, Ur: 0.8 mg/dL

## 2020-10-03 LAB — TSH: TSH: 2.13 mIU/L (ref 0.40–4.50)

## 2020-10-03 LAB — HEPATITIS C ANTIBODY
Hepatitis C Ab: NONREACTIVE
SIGNAL TO CUT-OFF: 0.02 (ref <1.00)

## 2020-10-03 NOTE — Telephone Encounter (Signed)
Called they state already been taken care of

## 2020-10-05 ENCOUNTER — Telehealth: Payer: Self-pay | Admitting: Family Medicine

## 2020-10-05 NOTE — Telephone Encounter (Signed)
LVM for patient to call our office back to so that we can ask some additional questions ([place] Sleep Scale Questions) in order to get his sleep study scheduled.

## 2020-10-07 ENCOUNTER — Other Ambulatory Visit: Payer: Self-pay | Admitting: Family Medicine

## 2020-10-07 DIAGNOSIS — E559 Vitamin D deficiency, unspecified: Secondary | ICD-10-CM

## 2020-11-08 ENCOUNTER — Other Ambulatory Visit: Payer: Self-pay | Admitting: Family Medicine

## 2020-11-08 DIAGNOSIS — E1169 Type 2 diabetes mellitus with other specified complication: Secondary | ICD-10-CM

## 2020-11-13 ENCOUNTER — Ambulatory Visit: Payer: No Typology Code available for payment source | Admitting: Family Medicine
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
## 2020-11-20 ENCOUNTER — Ambulatory Visit (INDEPENDENT_AMBULATORY_CARE_PROVIDER_SITE_OTHER): Payer: No Typology Code available for payment source | Admitting: Family Medicine

## 2020-11-20 ENCOUNTER — Other Ambulatory Visit: Payer: Self-pay

## 2020-11-20 ENCOUNTER — Encounter: Payer: Self-pay | Admitting: Family Medicine

## 2020-11-20 VITALS — BP 128/78 | HR 79 | Temp 97.9°F | Resp 16 | Ht 69.0 in | Wt 330.8 lb

## 2020-11-20 DIAGNOSIS — F341 Dysthymic disorder: Secondary | ICD-10-CM

## 2020-11-20 DIAGNOSIS — F419 Anxiety disorder, unspecified: Secondary | ICD-10-CM | POA: Diagnosis not present

## 2020-11-20 MED ORDER — BUSPIRONE HCL 5 MG PO TABS: 5.0000 mg | ORAL_TABLET | Freq: Three times a day (TID) | ORAL | 0 refills | Status: DC
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
## 2020-11-21 ENCOUNTER — Other Ambulatory Visit: Payer: Self-pay | Admitting: Family Medicine

## 2020-11-21 DIAGNOSIS — G8929 Other chronic pain: Secondary | ICD-10-CM

## 2020-11-21 DIAGNOSIS — M5440 Lumbago with sciatica, unspecified side: Secondary | ICD-10-CM

## 2020-11-21 NOTE — Telephone Encounter (Signed)
Requested medication (s) are due for refill today - yes  Requested medication (s) are on the active medication list -yes  Future visit scheduled - yes  Last refill: 08/18/20  Notes to clinic: Request for non delegated rx  Requested Prescriptions  Pending Prescriptions Disp Refills   pregabalin (LYRICA) 225 MG capsule [Pharmacy Med Name: PREGABALIN 225MG  CAPSULES] 180 capsule     Sig: TAKE 1 CAPSULE BY MOUTH TWICE DAILY      Not Delegated - Neurology:  Anticonvulsants - Controlled Failed - 11/21/2020  2:09 PM      Failed - This refill cannot be delegated      Passed - Valid encounter within last 12 months    Recent Outpatient Visits           Yesterday Dysthymia   [name], MD   1 month ago Diabetes mellitus type 2 in obese [facility])   [name], [facility]   4 months ago [facility], Drue Stager, MD   5 months ago Diabetes mellitus type 2 in obese [facility])   [facility], Drue Stager, MD   8 months ago Diabetes mellitus type 2 in obese [facility])   [name], MD       Future Appointments             In 1 month [name], MD [facility], [place]   In 6 months [name], MD [facility], [facility]                Requested Prescriptions  Pending Prescriptions Disp Refills   pregabalin (LYRICA) 225 MG capsule [Pharmacy Med Name: PREGABALIN 225MG  CAPSULES] 180 capsule     Sig: TAKE 1 CAPSULE BY MOUTH TWICE DAILY      Not Delegated - Neurology:  Anticonvulsants - Controlled Failed - 11/21/2020  2:09 PM      Failed - This refill cannot be delegated      Passed - Valid encounter within last 12 months    Recent Outpatient Visits           Yesterday Dysthymia   [name], [facility]   1 month ago Diabetes mellitus type 2 in obese [facility])   [name]   4 months ago [facility] [place], Drue Stager, MD   5 months ago Diabetes mellitus type 2 in obese [facility])   [name], MD   8 months ago Diabetes mellitus type 2 in obese [facility])   [name], MD       Future Appointments             In 1 month An[facility] Boozer, Drue Stager, MD [facility], [place]   In 6 months [name], MD [facility], [facility]

## 2020-12-15 ENCOUNTER — Other Ambulatory Visit: Payer: Self-pay | Admitting: Family Medicine

## 2020-12-15 DIAGNOSIS — E669 Obesity, unspecified: Secondary | ICD-10-CM

## 2021-01-02 ENCOUNTER — Other Ambulatory Visit: Payer: Self-pay | Admitting: Family Medicine

## 2021-01-02 DIAGNOSIS — I10 Essential (primary) hypertension: Secondary | ICD-10-CM

## 2021-01-02 DIAGNOSIS — F419 Anxiety disorder, unspecified: Secondary | ICD-10-CM

## 2021-01-02 DIAGNOSIS — F341 Dysthymic disorder: Secondary | ICD-10-CM

## 2021-01-02 DIAGNOSIS — G4709 Other insomnia: Secondary | ICD-10-CM

## 2021-01-02 NOTE — Telephone Encounter (Signed)
Requested medication (s) are due for refill today - yes  Requested medication (s) are on the active medication list -yes  Future visit scheduled -yes  Last refill: 10/02/20  Notes to clinic: Request non delegated rx  Requested Prescriptions  Pending Prescriptions Disp Refills   QUEtiapine (SEROQUEL) 25 MG tablet [Pharmacy Med Name: QUETIAPINE 25MG  TABLETS] 90 tablet 0    Sig: TAKE 1 TABLET(25 MG) BY MOUTH AT BEDTIME      Not Delegated - Psychiatry:  Antipsychotics - Second Generation (Atypical) - quetiapine Failed - 01/02/2021  3:41 PM      Failed - This refill cannot be delegated      Passed - ALT in normal range and within 180 days    ALT  Date Value Ref Range Status  10/02/2020 12 9 - 46 U/L Final          Passed - AST in normal range and within 180 days    AST  Date Value Ref Range Status  10/02/2020 13 10 - 40 U/L Final          Passed - Last BP in normal range    BP Readings from Last 1 Encounters:  11/20/20 128/78          Passed - Valid encounter within last 6 months    Recent Outpatient Visits           1 month ago [facility], Drue Stager, MD   3 months ago Diabetes mellitus type 2 in obese [facility])   [name], [facility]   5 months ago [name], MD   6 months ago Diabetes mellitus type 2 in obese [facility])   [name], MD   9 months ago Diabetes mellitus type 2 in obese [facility])   [name], MD       Future Appointments             In 1 week [name], [facility], [place]   In 4 months [name], MD [facility], [facility]              Signed Prescriptions Disp Refills   valsartan (DIOVAN) 320 MG tablet 90 tablet 0    Sig: TAKE 1 TABLET BY MOUTH DAILY(IN PLACE OF LOSARTAN 50MG )      Cardiovascular:  Angiotensin  Receptor Blockers Passed - 01/02/2021  3:41 PM      Passed - Cr in normal range and within 180 days    Creat  Date Value Ref Range Status  10/02/2020 1.21 0.60 - 1.35 mg/dL Final   Creatinine, Urine  Date Value Ref Range Status  10/02/2020 143 20 - 320 mg/dL Final          Passed - K in normal range and within 180 days    Potassium  Date Value Ref Range Status  10/02/2020 4.4 3.5 - 5.3 mmol/L Final  01/11/2015 4.2 3.5 - 5.1 mmol/L Final          Passed - Patient is not pregnant      Passed - Last BP in normal range    BP Readings from Last 1 Encounters:  11/20/20 128/78          Passed - Valid encounter within last 6 months    Recent Outpatient Visits  1 month ago [facility] [place], Drue Stager, MD   3 months ago Diabetes mellitus type 2 in obese [facility])   [name], MD   5 months ago [facility], Drue Stager, MD   6 months ago Diabetes mellitus type 2 in obese [facility])   [name], MD   9 months ago Diabetes mellitus type 2 in obese [facility])   [name], MD       Future Appointments             In 1 week [name], MD [facility], [place]   In 4 months [name], MD [facility], [facility]                 Requested Prescriptions  Pending Prescriptions Disp Refills   QUEtiapine (SEROQUEL) 25 MG tablet [Pharmacy Med Name: QUETIAPINE 25MG  TABLETS] 90 tablet 0    Sig: TAKE 1 TABLET(25 MG) BY MOUTH AT BEDTIME      Not Delegated - Psychiatry:  Antipsychotics - Second Generation (Atypical) - quetiapine Failed - 01/02/2021  3:41 PM      Failed - This refill cannot be delegated      Passed - ALT in normal range and within 180 days    ALT  Date Value Ref Range Status  10/02/2020 12 9 - 46 U/L Final          Passed - AST in normal range  and within 180 days    AST  Date Value Ref Range Status  10/02/2020 13 10 - 40 U/L Final          Passed - Last BP in normal range    BP Readings from Last 1 Encounters:  11/20/20 128/78          Passed - Valid encounter within last 6 months    Recent Outpatient Visits           1 month ago [facility] [place], Drue Stager, MD   3 months ago Diabetes mellitus type 2 in obese [facility])   [name], [facility]   5 months ago [facility] [place], Drue Stager, MD   6 months ago Diabetes mellitus type 2 in obese [facility])   [facility], Drue Stager, MD   9 months ago Diabetes mellitus type 2 in obese [facility])    [name], MD       Future Appointments             In 1 week [name], MD [facility], [place]   In 4 months [name], MD [facility], [facility]              Signed Prescriptions Disp Refills   valsartan (DIOVAN) 320 MG tablet 90 tablet 0    Sig: TAKE 1 TABLET BY MOUTH DAILY(IN PLACE OF LOSARTAN 50MG )      Cardiovascular:  Angiotensin Receptor Blockers Passed - 01/02/2021  3:41 PM      Passed - Cr in normal range and within 180 days    Creat  Date Value Ref Range Status  10/02/2020 1.21 0.60 - 1.35 mg/dL Final   Creatinine, Urine  Date Value Ref Range Status  10/02/2020 143 20 - 320 mg/dL Final  Passed - K in normal range and within 180 days    Potassium  Date Value Ref Range Status  10/02/2020 4.4 3.5 - 5.3 mmol/L Final  01/11/2015 4.2 3.5 - 5.1 mmol/L Final          Passed - Patient is not pregnant      Passed - Last BP in normal range    BP Readings from Last 1 Encounters:  11/20/20 128/78          Passed - Valid encounter within last 6 months    Recent Outpatient Visits           1 month ago [facility],  Drue Stager, MD   3 months ago Diabetes mellitus type 2 in obese [facility])   [name], MD   5 months ago [facility], Drue Stager, MD   6 months ago Diabetes mellitus type 2 in obese [facility])   [name], MD   9 months ago Diabetes mellitus type 2 in obese [facility])   [name], MD       Future Appointments             In 1 week [name], MD [facility], [place]   In 4 months [name], MD [facility], [facility]

## 2021-01-08 ENCOUNTER — Other Ambulatory Visit: Payer: Self-pay | Admitting: Family Medicine

## 2021-01-08 DIAGNOSIS — F419 Anxiety disorder, unspecified: Secondary | ICD-10-CM

## 2021-01-08 DIAGNOSIS — F341 Dysthymic disorder: Secondary | ICD-10-CM

## 2021-01-08 NOTE — Telephone Encounter (Signed)
Requested Prescriptions  Pending Prescriptions Disp Refills  . busPIRone (BUSPAR) 5 MG tablet [Pharmacy Med Name: BUSPIRONE 5MG  TABLETS] 180 tablet 0    Sig: TAKE 1 TO 2 TABLETS(5 TO 10 MG) BY MOUTH THREE TIMES DAILY     Psychiatry: Anxiolytics/Hypnotics - Non-controlled Passed - 01/08/2021  9:56 AM      Passed - Valid encounter within last 6 months    Recent Outpatient Visits          1 month ago [facility] [place], Drue Stager, MD   3 months ago Diabetes mellitus type 2 in obese [facility])   [name], MD   5 months ago [facility] [place], Drue Stager, MD   6 months ago Diabetes mellitus type 2 in obese [facility])   [name] MD   9 months ago Diabetes mellitus type 2 in obese [facility])   [name], MD      Future Appointments            In 4 months Ancil Boozer, Drue Stager, [facility]

## 2021-01-09 ENCOUNTER — Ambulatory Visit (INDEPENDENT_AMBULATORY_CARE_PROVIDER_SITE_OTHER): Payer: BC Managed Care – PPO | Admitting: Family Medicine

## 2021-01-09 ENCOUNTER — Other Ambulatory Visit: Payer: Self-pay

## 2021-01-09 ENCOUNTER — Ambulatory Visit: Payer: No Typology Code available for payment source | Admitting: Family Medicine

## 2021-01-09 ENCOUNTER — Encounter: Payer: Self-pay | Admitting: Family Medicine

## 2021-01-09 VITALS — BP 126/74 | HR 83 | Temp 98.1°F | Resp 16 | Ht 69.0 in | Wt 343.6 lb

## 2021-01-09 DIAGNOSIS — E559 Vitamin D deficiency, unspecified: Secondary | ICD-10-CM | POA: Diagnosis not present

## 2021-01-09 DIAGNOSIS — I10 Essential (primary) hypertension: Secondary | ICD-10-CM

## 2021-01-09 DIAGNOSIS — E1169 Type 2 diabetes mellitus with other specified complication: Secondary | ICD-10-CM

## 2021-01-09 DIAGNOSIS — E669 Obesity, unspecified: Secondary | ICD-10-CM

## 2021-01-09 DIAGNOSIS — M5442 Lumbago with sciatica, left side: Secondary | ICD-10-CM

## 2021-01-09 DIAGNOSIS — F341 Dysthymic disorder: Secondary | ICD-10-CM | POA: Diagnosis not present

## 2021-01-09 DIAGNOSIS — G4733 Obstructive sleep apnea (adult) (pediatric): Secondary | ICD-10-CM

## 2021-01-09 DIAGNOSIS — E039 Hypothyroidism, unspecified: Secondary | ICD-10-CM | POA: Diagnosis not present

## 2021-01-09 DIAGNOSIS — R252 Cramp and spasm: Secondary | ICD-10-CM

## 2021-01-09 DIAGNOSIS — M1712 Unilateral primary osteoarthritis, left knee: Secondary | ICD-10-CM

## 2021-01-09 DIAGNOSIS — G4709 Other insomnia: Secondary | ICD-10-CM

## 2021-01-09 DIAGNOSIS — M5441 Lumbago with sciatica, right side: Secondary | ICD-10-CM

## 2021-01-09 DIAGNOSIS — Z9884 Bariatric surgery status: Secondary | ICD-10-CM

## 2021-01-09 DIAGNOSIS — E538 Deficiency of other specified B group vitamins: Secondary | ICD-10-CM

## 2021-01-09 DIAGNOSIS — G8929 Other chronic pain: Secondary | ICD-10-CM

## 2021-01-09 DIAGNOSIS — M109 Gout, unspecified: Secondary | ICD-10-CM

## 2021-01-09 LAB — POCT GLYCOSYLATED HEMOGLOBIN (HGB A1C): Hemoglobin A1C: 6.7 % — AB (ref 4.0–5.6)

## 2021-01-09 MED ORDER — TRAZODONE HCL 100 MG PO TABS
ORAL_TABLET | ORAL | 0 refills | Status: DC
Start: 2021-01-09 — End: 2021-03-28

## 2021-01-09 MED ORDER — OZEMPIC (1 MG/DOSE) 4 MG/3ML ~~LOC~~ SOPN: 1.0000 mg | PEN_INJECTOR | SUBCUTANEOUS | 1 refills | Status: DC

## 2021-01-09 MED ORDER — HYDROCODONE-ACETAMINOPHEN 7.5-325 MG PO TABS
1.0000 | ORAL_TABLET | Freq: Three times a day (TID) | ORAL | 0 refills | Status: DC | PRN
Start: 2021-01-09 — End: 2021-03-28

## 2021-01-09 MED ORDER — METFORMIN HCL ER 750 MG PO TB24: 750.0000 mg | ORAL_TABLET | Freq: Every day | ORAL | 1 refills | Status: DC

## 2021-01-09 MED ORDER — VALSARTAN 320 MG PO TABS: 320.0000 mg | ORAL_TABLET | Freq: Every day | ORAL | 0 refills | Status: DC

## 2021-01-09 MED ORDER — SYNTHROID 137 MCG PO TABS
ORAL_TABLET | ORAL | 1 refills | Status: DC
Start: 2021-01-09 — End: 2021-07-06

## 2021-01-09 MED ORDER — ALLOPURINOL 100 MG PO TABS: ORAL_TABLET | ORAL | 1 refills | Status: DC

## 2021-01-09 MED ORDER — DULOXETINE HCL 30 MG PO CPEP: 30.0000 mg | ORAL_CAPSULE | Freq: Every day | ORAL | 0 refills | Status: DC

## 2021-01-09 MED ORDER — HYDROCODONE-ACETAMINOPHEN 7.5-325 MG PO TABS: 1.0000 | ORAL_TABLET | Freq: Three times a day (TID) | ORAL | 0 refills | Status: DC | PRN

## 2021-01-09 MED ORDER — BLOOD GLUCOSE METER KIT: PACK | 0 refills | Status: DC

## 2021-01-09 MED ORDER — CYCLOBENZAPRINE HCL 10 MG PO TABS: ORAL_TABLET | ORAL | 1 refills | Status: DC

## 2021-01-09 NOTE — Patient Instructions (Signed)
Please take half pill of lexapro with one capsule of duloxetine 30 mg for one week, after that stop lexapro and go up on duloxetine to  2 caps ( 60 mg ) daily , call back in about one month for a refill of 60 mg capsules

## 2021-01-09 NOTE — Progress Notes (Signed)
Name: [name]   MRN: [id number]    DOB: Jul 24, 1971   Date:01/09/2021       Progress Note  Subjective  Chief Complaint  Medication Refill  HPI   Anxiety/Depression : he states he has been under more stress at work. 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  He states his sister-in-law finally moved out of their house .  His son is currently in jail, he had sex with a girl under age - it happened a couple of year ago but charged in July and he is now in prison outside of [place] for 4-7 years in prison. His son has a girlfriend that had twin girls in September , they were premature but they are doing well now.  He continues to feel overwhelmed and loses his temper at home and at work, gets triggered by loud sounds. He states sleeping is better with seroquel but lexapro has not really helped his mood, feels edgy all the time. He is on Buspar prn and also Trazodone at night. Discussed importance of seeing a psychiatrist but he would like to try the medication his wife takes - Duloxetine - first   DMII: diet controlled since bariatric surgery10/2013,but hgbA1C was gradually increasingbut has been stable over the pastyear,from6.2%up to6.3% A1C 7 %, 6.9% and today is at 6.7 % .Denies polyphagia, polydipsia or polyuria.Eye examis up to date. BP is now at goal on Diovan 320 mg daily . He is eating at home, sometimes eats at work - but they grill outside.  He is on Metformin and Ozemipc and today we will increase dose to 1 mg   Muscle Cramps: he states not cramping as much that is colder, symptoms severe during hot months.   Hypothyroidism: taking same dose of Synthroid for many months, last TSH at goal. He has chronic dry skin, no hair loss, no change in bowel movement or palpitationUnchanged   Chronic low back pain : he is taking Norco 7.5/325mgmostly twice dailyand is getting 70 pills , he states even though it is colder  he does not feel he needs to go up on the amount to 100 mg , using a home remedy that consists on honey , black pepper and some more herb/spices and is helping control the pain. Taking Lyricaand it seems to help with pain and denies side effectsNo longer under disability , working full time and has a physical job digging graves,he stopped taking goody-powdersPain contract singed03/2021Pain level right now is5/10 on lower back and both legs.  Neuropathy: he has bilateral daily pain on both legs, secondary to chronic back problems, aching like, he denies burning or prickly sensation, Lyrica helps.Current pain of 3/10   History of bariatric surgery/Morbid obesity: surgery 09/2012 was he is morbidly obesehis weight at the time was 396 lbs- lowest weight after surgery was 275 lbs,he gradually gained weight back,today it is 343.6 lbsbut he thinks it is because he is wearing his work boots. He is drinking more water now. His job is very active, he is not sure how he is gaining weight. He denies eating fast food   PIR:JJOA taking diovan now , no chest pain, palpitation or dizziness. BP is at goal   Controlled gout; taking allopurinol , no recent episodes    Patient Active Problem List   Diagnosis Date Noted  . Chronic midline low back pain with sciatica 02/11/2017  . Anemia, unspecified 06/12/2016  . B12 deficiency 06/11/2016  .  Allergic conjunctivitis 06/11/2016  . Hypocalcemia 06/11/2016  . Muscle cramps 06/11/2016  . Hypertension, benign 11/03/2015  . Dyslipidemia 11/02/2015  . Acquired nystagmus 08/01/2015  . Chronic back pain 06/29/2015  . Carpal tunnel syndrome 06/29/2015  . Osteoarthritis 06/29/2015  . Claustrophobia 06/29/2015  . Foot drop, left 06/29/2015  . Hiatal hernia 06/29/2015  . H/O diabetes mellitus 06/29/2015  . H/O testicular cancer 06/29/2015  . Acquired hypothyroidism 06/29/2015  . Dysmetabolic syndrome 13/24/4010  . Morbid obesity ([place]) 06/29/2015   . Allergic rhinitis 06/29/2015  . Neuropathy 06/29/2015  . Seborrheic keratoses 06/29/2015  . Vitamin D deficiency 06/29/2015  . Bariatric surgery status 02/20/2012    Past Surgical History:  Procedure Laterality Date  . BACK SURGERY     X 2  . HERNIA REPAIR     left side and umbilical hernia repair  . 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 URETEROTOMY Left 2000    Family History  Problem Relation Age of Onset  . Coronary artery disease Mother   . Cancer Father        Melanoma and Testicular  . Diabetes Father     Social History   Tobacco Use  . Smoking status: Former Smoker    Packs/day: 3.00    Years: 8.00    Pack years: 24.00    Types: Cigarettes, Cigars, Pipe    Start date: 06/28/1984    Quit date: 12/24/1991    Years since quitting: 29.0  . Smokeless tobacco: Former Systems developer    Types: Chew    Quit date: 12/24/1991  Substance Use Topics  . Alcohol use: No    Alcohol/week: 0.0 standard drinks     Current Outpatient Medications:  .  ammonium lactate (LAC-HYDRIN) 12 % cream, Apply topically as needed for dry skin., Disp: 385 g, Rfl: 0 .  atorvastatin (LIPITOR) 10 MG tablet, TAKE 1 TABLET(10 MG) BY MOUTH DAILY, Disp: 90 tablet, Rfl: 1 .  blood glucose meter kit and supplies, Dispense based on patient and insurance preference. Use up to four times daily as directed. (FOR ICD-10 E10.9, E11.9)., Disp: 1 each, Rfl: 0 .  busPIRone (BUSPAR) 5 MG tablet, TAKE 1 TO 2 TABLETS(5 TO 10 MG) BY MOUTH THREE TIMES DAILY, Disp: 180 tablet, Rfl: 0 .  Calcium Carbonate-Vit D-Min (CALTRATE 600+D PLUS MINERALS) 600-800 MG-UNIT CHEW, Chew 1 tablet by mouth daily., Disp: 180 tablet, Rfl: 1 .  Cyanocobalamin (VITAMIN B-12) 500 MCG SUBL, Place 1 tablet (500 mcg total) under the tongue daily., Disp: 90 tablet, Rfl: 3 .  diclofenac sodium (VOLTAREN) 1 % GEL, APPLY 4 GRAMS TOPICALLY FOUR TIMES DAILY TO KNEE, Disp: 100 g, Rfl: 1 .  DULoxetine (CYMBALTA) 30 MG capsule, Take 1-2 capsules (30-60 mg  total) by mouth daily. First week after that 60 mg daily, Disp: 60 capsule, Rfl: 0 .  ferrous sulfate 325 (65 FE) MG tablet, Take 1 tablet (325 mg total) by mouth daily with breakfast., Disp: 90 tablet, Rfl: 3 .  fexofenadine (ALLEGRA) 180 MG tablet, TAKE 1 TABLET(180 MG TOTAL) BY MOUTH DAILY, Disp: 90 tablet, Rfl: 3 .  Homeopathic Products ([employer] LEG CRAMPS PAIN RELIEF PO), Take 2 tablets by mouth 2 (two) times daily at 10 am and 4 pm., Disp: , Rfl:  .  Insulin Pen Needle (NOVOFINE) 30G X 8 MM MISC, Inject 10 each into the skin once a week., Disp: 100 each, Rfl: 0 .  ketoconazole (NIZORAL) 2 % cream, Apply 1 application topically daily., Disp: 60 g,  Rfl: 1 .  Magnesium Oxide 500 MG TABS, TAKE 1 TABLET(500 MG) BY MOUTH DAILY, Disp: 90 tablet, Rfl: 0 .  Melatonin-Pyridoxine (MELATONEX PO), Take by mouth., Disp: , Rfl:  .  multivitamin-iron-minerals-folic acid (CENTRUM) chewable tablet, Chew 1 tablet by mouth daily., Disp: 90 tablet, Rfl: 3 .  Potassium 99 MG TABS, Take by mouth., Disp: , Rfl:  .  pregabalin (LYRICA) 225 MG capsule, TAKE 1 CAPSULE BY MOUTH TWICE DAILY., Disp: 180 capsule, Rfl: 1 .  QUEtiapine (SEROQUEL) 25 MG tablet, TAKE 1 TABLET(25 MG) BY MOUTH AT BEDTIME, Disp: 90 tablet, Rfl: 0 .  Semaglutide, 1 MG/DOSE, (OZEMPIC, 1 MG/DOSE,) 4 MG/3ML SOPN, Inject 1 mg into the skin once a week., Disp: 9 mL, Rfl: 1 .  allopurinol (ZYLOPRIM) 100 MG tablet, TAKE 1 TABLET(100 MG) BY MOUTH DAILY, Disp: 90 tablet, Rfl: 1 .  cyclobenzaprine (FLEXERIL) 10 MG tablet, TAKE 1 TABLET(10 MG) BY MOUTH AT BEDTIME, Disp: 90 tablet, Rfl: 1 .  HYDROcodone-acetaminophen (NORCO) 7.5-325 MG tablet, Take 1 tablet by mouth 3 (three) times daily as needed., Disp: 70 tablet, Rfl: 0 .  HYDROcodone-acetaminophen (NORCO) 7.5-325 MG tablet, Take 1 tablet by mouth 3 (three) times daily as needed for moderate pain. Feb 17 th 2022, Disp: 70 tablet, Rfl: 0 .  HYDROcodone-acetaminophen (NORCO) 7.5-325 MG tablet, Take 1 tablet by  mouth 3 (three) times daily as needed for moderate pain., Disp: 70 tablet, Rfl: 0 .  metFORMIN (GLUCOPHAGE-XR) 750 MG 24 hr tablet, Take 1 tablet (750 mg total) by mouth daily with breakfast., Disp: 90 tablet, Rfl: 1 .  SYNTHROID 137 MCG tablet, TAKE 1 TABLET BY MOUTH EVERY DAY AND 1 AND 1/2 ON SUNDAYS, Disp: 90 tablet, Rfl: 1 .  traZODone (DESYREL) 100 MG tablet, TAKE 1 AND 1/2 TABLETS(150 MG) BY MOUTH AT BEDTIME, Disp: 135 tablet, Rfl: 0 .  valsartan (DIOVAN) 320 MG tablet, Take 1 tablet (320 mg total) by mouth daily., Disp: 90 tablet, Rfl: 0  Allergies  Allergen Reactions  . Morphine Itching    I personally reviewed active problem list, medication list, allergies, family history, social history, health maintenance with the patient/caregiver today.   ROS  Constitutional: Negative for fever , positive for  weight change.  Respiratory: Negative for cough and shortness of breath.   Cardiovascular: Negative for chest pain or palpitations.  Gastrointestinal: Negative for abdominal pain, no bowel changes.  Musculoskeletal: Negative for gait problem or joint swelling.  Skin: Negative for rash.  Neurological: Negative for dizziness or headache.  No other specific complaints in a complete review of systems (except as listed in HPI above).  Objective  Vitals:   01/09/21 1056  BP: 126/74  Pulse: 83  Resp: 16  Temp: 98.1 F (36.7 C)  TempSrc: Oral  SpO2: 97%  Weight: (!) 343 lb 9.6 oz (155.9 kg)  Height: _0  (1.753 m)    Body mass index is 50.74 kg/m.  Physical Exam  Constitutional: Patient appears well-developed and well-nourished. Obese  No distress.  HEENT: head atraumatic, normocephalic, pupils equal and reactive to light,  neck supple Cardiovascular: Normal rate, regular rhythm and normal heart sounds.  No murmur heard. No BLE edema. Pulmonary/Chest: Effort normal and breath sounds normal. No respiratory distress. Abdominal: Soft.  There is no tenderness. Muscular  skeletal: negative straight leg raise, no pain during palpation of lumbar spine  Psychiatric: Patient has a normal mood and affect. behavior is normal. Judgment and thought content normal.  PHQ2/9: Depression screen [facility] 2/9  01/09/2021 11/20/2020 10/02/2020 07/17/2020 06/20/2020  Decreased Interest 0 0 2 2 0  Down, Depressed, Hopeless 0 0 0 0 0  PHQ - 2 Score 0 0 2 2 0  Altered sleeping _0 - 0  Tired, decreased energy 0 0 1 3 0  Change in appetite 0 0 1 3 0  Feeling bad or failure about yourself  0 0 0 0 0  Trouble concentrating 0 0 0 0 0  Moving slowly or fidgety/restless 0 0 0 0 0  Suicidal thoughts 0 0 0 0 0  PHQ-9 Score _1 - 0  Difficult doing work/chores - - Somewhat difficult - -  Some recent data might be hidden    phq 9 is negative   Fall Risk: Fall Risk  01/09/2021 11/20/2020 10/02/2020 07/17/2020 06/20/2020  Falls in the past year? 0 1 0 0 0  Number falls in past yr: 0 1 0 0 0  Injury with Fall? 0 0 0 0 0  Follow up - - - - -      Assessment & Plan  1. Dysthymia  - DULoxetine (CYMBALTA) 30 MG capsule; Take 1-2 capsules (30-60 mg total) by mouth daily. First week after that 60 mg daily  Dispense: 60 capsule; Refill: 0  2. Vitamin D deficiency   3. Acquired hypothyroidism  - SYNTHROID 137 MCG tablet; TAKE 1 TABLET BY MOUTH EVERY DAY AND 1 AND 1/2 ON SUNDAYS  Dispense: 90 tablet; Refill: 1  4. Diabetes mellitus type 2 in obese (HCC)  - metFORMIN (GLUCOPHAGE-XR) 750 MG 24 hr tablet; Take 1 tablet (750 mg total) by mouth daily with breakfast.  Dispense: 90 tablet; Refill: 1 - Semaglutide, 1 MG/DOSE, (OZEMPIC, 1 MG/DOSE,) 4 MG/3ML SOPN; Inject 1 mg into the skin once a week.  Dispense: 9 mL; Refill: 1 - blood glucose meter kit and supplies; Dispense based on patient and insurance preference. Use up to four times daily as directed. (FOR ICD-10 E10.9, E11.9).  Dispense: 1 each; Refill: 0  5. B12 deficiency  Take supplements   6. Hypertension, benign  -  valsartan (DIOVAN) 320 MG tablet; Take 1 tablet (320 mg total) by mouth daily.  Dispense: 90 tablet; Refill: 0  7. Controlled gout  - allopurinol (ZYLOPRIM) 100 MG tablet; TAKE 1 TABLET(100 MG) BY MOUTH DAILY  Dispense: 90 tablet; Refill: 1  8. Chronic midline low back pain with bilateral sciatica  - HYDROcodone-acetaminophen (NORCO) 7.5-325 MG tablet; Take 1 tablet by mouth 3 (three) times daily as needed.  Dispense: 70 tablet; Refill: 0 - HYDROcodone-acetaminophen (NORCO) 7.5-325 MG tablet; Take 1 tablet by mouth 3 (three) times daily as needed for moderate pain. Feb 17 th 2022  Dispense: 70 tablet; Refill: 0 - HYDROcodone-acetaminophen (NORCO) 7.5-325 MG tablet; Take 1 tablet by mouth 3 (three) times daily as needed for moderate pain.  Dispense: 70 tablet; Refill: 0  9. Morbid obesity ([place])  Discussed with the patient the risk posed by an increased BMI. Discussed importance of portion control, calorie counting and at least 150 minutes of physical activity weekly. Avoid sweet beverages and drink more water. Eat at least 6 servings of fruit and vegetables daily   10. OSA (obstructive sleep apnea)  Does not wear cpap  11. Bariatric surgery status   12. Primary osteoarthritis of left knee   13. Chronic nonmalignant pain  - HYDROcodone-acetaminophen (NORCO) 7.5-325 MG tablet; Take 1 tablet by mouth 3 (three) times daily as needed.  Dispense: 70 tablet; Refill:  0 - HYDROcodone-acetaminophen (NORCO) 7.5-325 MG tablet; Take 1 tablet by mouth 3 (three) times daily as needed for moderate pain. Feb 17 th 2022  Dispense: 70 tablet; Refill: 0 - HYDROcodone-acetaminophen (NORCO) 7.5-325 MG tablet; Take 1 tablet by mouth 3 (three) times daily as needed for moderate pain.  Dispense: 70 tablet; Refill: 0  14. Muscle cramps  - cyclobenzaprine (FLEXERIL) 10 MG tablet; TAKE 1 TABLET(10 MG) BY MOUTH AT BEDTIME  Dispense: 90 tablet; Refill: 1  15. Other insomnia  - traZODone (DESYREL) 100 MG  tablet; TAKE 1 AND 1/2 TABLETS(150 MG) BY MOUTH AT BEDTIME  Dispense: 135 tablet; Refill: 0

## 2021-01-09 NOTE — Addendum Note (Signed)
Addended by: Carlene Coria on: 01/09/2021 11:35 AM   Modules accepted: Orders

## 2021-01-15 ENCOUNTER — Other Ambulatory Visit: Payer: Self-pay | Admitting: Family Medicine

## 2021-01-15 DIAGNOSIS — F341 Dysthymic disorder: Secondary | ICD-10-CM

## 2021-01-15 DIAGNOSIS — F419 Anxiety disorder, unspecified: Secondary | ICD-10-CM

## 2021-01-24 ENCOUNTER — Ambulatory Visit: Payer: No Typology Code available for payment source | Admitting: Family Medicine

## 2021-02-13 ENCOUNTER — Other Ambulatory Visit: Payer: Self-pay | Admitting: Family Medicine

## 2021-02-13 DIAGNOSIS — F419 Anxiety disorder, unspecified: Secondary | ICD-10-CM

## 2021-02-13 DIAGNOSIS — F341 Dysthymic disorder: Secondary | ICD-10-CM

## 2021-02-17 ENCOUNTER — Other Ambulatory Visit: Payer: Self-pay | Admitting: Family Medicine

## 2021-02-17 DIAGNOSIS — E1169 Type 2 diabetes mellitus with other specified complication: Secondary | ICD-10-CM

## 2021-02-17 DIAGNOSIS — F341 Dysthymic disorder: Secondary | ICD-10-CM

## 2021-02-17 DIAGNOSIS — E669 Obesity, unspecified: Secondary | ICD-10-CM

## 2021-02-17 NOTE — Telephone Encounter (Signed)
Requested Prescriptions  Pending Prescriptions Disp Refills  . OZEMPIC, 0.25 OR 0.5 MG/DOSE, 2 MG/1.5ML SOPN [Pharmacy Med Name: OZEMPIC 0.25 OR 0.5MG /DOS 1X2MG  PEN] 1.5 mL     Sig: INJECT 0.5 MG INTO THE SKIN ONCE A WEEK. START AT 0.25 MG WEEKLY FOR THE FIRST TWO WEEKS THEN GO UP TO 0.5 MG ONCE A WEEK     Endocrinology:  Diabetes - GLP-1 Receptor Agonists Passed - 02/17/2021  9:57 AM      Passed - HBA1C is between 0 and 7.9 and within 180 days    Hemoglobin A1C  Date Value Ref Range Status  01/09/2021 6.7 (A) 4.0 - 5.6 % Final   HbA1c, POC (controlled diabetic range)  Date Value Ref Range Status  09/16/2019 6.2 0.0 - 7.0 % Final   Hgb A1c MFr Bld  Date Value Ref Range Status  10/02/2020 6.9 (H) <5.7 % of total Hgb Final    Comment:    For someone without known diabetes, a hemoglobin A1c value of 6.5% or greater indicates that they may have  diabetes and this should be confirmed with a follow-up  test. . For someone with known diabetes, a value <7% indicates  that their diabetes is well controlled and a value  greater than or equal to 7% indicates suboptimal  control. A1c targets should be individualized based on  duration of diabetes, age, comorbid conditions, and  other considerations. . Currently, no consensus exists regarding use of hemoglobin A1c for diagnosis of diabetes for children. Renella Cunas - Valid encounter within last 6 months    Recent Outpatient Visits          1 month ago [facility] [name], MD   2 months ago [facility] [place], Drue Stager, MD   4 months ago Diabetes mellitus type 2 in obese [facility])   [facility] [name], MD   7 months ago [facility] [name], MD   8 months ago Diabetes mellitus type 2 in obese [facility])   [facility] [name], MD      Future Appointments             In 1 month [name], MD [facility], [place]   In 3 months [name], MD [facility], [place]           . DULoxetine (CYMBALTA) 30 MG capsule [Pharmacy Med Name: DULOXETINE DR 30MG  CAPSULES] 180 capsule 0    Sig: TAKE ONE CAPSULE BY MOUTH DAILY FOR FIRST WEEK THEN 2 CAPSULES DAILY     Psychiatry: Antidepressants - SNRI Passed - 02/17/2021  9:57 AM      Passed - Last BP in normal range    BP Readings from Last 1 Encounters:  01/09/21 126/74         Passed - Valid encounter within last 6 months    Recent Outpatient Visits          1 month ago [facility] [name], MD   2 months ago [facility] [place], Drue Stager, MD   4 months ago Diabetes mellitus type 2 in obese [facility])   [facility] [name], MD   7 months ago [facility] [name], MD   8  months ago Diabetes mellitus type 2 in obese [facility])   [facility] [name], MD      Future Appointments            In 1 month An[facility] Boozer, Drue Stager, MD [facility], [place]   In 3 months [name], MD [facility], [facility]

## 2021-03-15 ENCOUNTER — Other Ambulatory Visit: Payer: Self-pay | Admitting: Family Medicine

## 2021-03-15 DIAGNOSIS — G4709 Other insomnia: Secondary | ICD-10-CM

## 2021-03-15 DIAGNOSIS — F341 Dysthymic disorder: Secondary | ICD-10-CM

## 2021-03-15 DIAGNOSIS — F419 Anxiety disorder, unspecified: Secondary | ICD-10-CM

## 2021-03-15 NOTE — Telephone Encounter (Signed)
Requested Prescriptions  Pending Prescriptions Disp Refills  . QUEtiapine (SEROQUEL) 25 MG tablet [Pharmacy Med Name: QUETIAPINE 25MG  TABLETS] 90 tablet 0    Sig: TAKE 1 TABLET(25 MG) BY MOUTH AT BEDTIME     Not Delegated - Psychiatry:  Antipsychotics - Second Generation (Atypical) - quetiapine Failed - 03/15/2021  6:28 AM      Failed - This refill cannot be delegated      Passed - ALT in normal range and within 180 days    ALT  Date Value Ref Range Status  10/02/2020 12 9 - 46 U/L Final         Passed - AST in normal range and within 180 days    AST  Date Value Ref Range Status  10/02/2020 13 10 - 40 U/L Final         Passed - Last BP in normal range    BP Readings from Last 1 Encounters:  01/09/21 126/74         Passed - Valid encounter within last 6 months    Recent Outpatient Visits          2 months ago [facility] [name], MD   3 months ago [facility] [place], Drue Stager, MD   5 months ago Diabetes mellitus type 2 in obese [facility])   [facility] [name], MD   8 months ago [facility] [place], Drue Stager, MD   8 months ago Diabetes mellitus type 2 in obese [facility])   [facility] [name], MD      Future Appointments            In 1 week [name], MD [facility], [place]   In 2 months [name], MD [facility]           . busPIRone (BUSPAR) 5 MG tablet [Pharmacy Med Name: BUSPIRONE 5MG  TABLETS] 180 tablet 1    Sig: TAKE 1 TO 2 TABLETS(5 TO 10 MG) BY MOUTH THREE TIMES DAILY     Psychiatry: Anxiolytics/Hypnotics - Non-controlled Passed - 03/15/2021  6:28 AM      Passed - Valid encounter within last 6 months    Recent Outpatient Visits          2 months ago [facility] [name], MD   3 months ago [facility] [place], Drue Stager, MD   5 months ago Diabetes mellitus type 2 in obese [facility])   [facility] [name], MD   8 months ago [facility] [place], Drue Stager, MD   8 months ago Diabetes mellitus type 2 in obese [facility])   [facility] [name], MD      Future Appointments            In 1 week [name], MD [facility], [place]   In 2 months [name], MD [facility], [facility]

## 2021-03-15 NOTE — Telephone Encounter (Signed)
Requested medications are due for refill today.  yes  Requested medications are on the active medications list.  yes  Last refill. 01/02/2021  Future visit scheduled.   yes  Notes to clinic.  Medication not delegated.

## 2021-03-21 ENCOUNTER — Telehealth: Payer: Self-pay

## 2021-03-21 ENCOUNTER — Other Ambulatory Visit: Payer: Self-pay | Admitting: Family Medicine

## 2021-03-21 MED ORDER — ROSUVASTATIN CALCIUM 5 MG PO TABS: 5.0000 mg | ORAL_TABLET | Freq: Every day | ORAL | 1 refills | Status: DC

## 2021-03-23 NOTE — Telephone Encounter (Signed)
Medication switched 
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
## 2021-03-28 ENCOUNTER — Encounter: Payer: Self-pay | Admitting: Family Medicine

## 2021-03-28 ENCOUNTER — Other Ambulatory Visit: Payer: Self-pay

## 2021-03-28 ENCOUNTER — Ambulatory Visit: Payer: Managed Care, Other (non HMO) | Admitting: Family Medicine

## 2021-03-28 VITALS — BP 126/80 | HR 82 | Temp 97.9°F | Resp 16 | Ht 69.0 in | Wt 333.0 lb

## 2021-03-28 DIAGNOSIS — G4733 Obstructive sleep apnea (adult) (pediatric): Secondary | ICD-10-CM | POA: Diagnosis not present

## 2021-03-28 DIAGNOSIS — M109 Gout, unspecified: Secondary | ICD-10-CM | POA: Diagnosis not present

## 2021-03-28 DIAGNOSIS — M5441 Lumbago with sciatica, right side: Secondary | ICD-10-CM

## 2021-03-28 DIAGNOSIS — Z9884 Bariatric surgery status: Secondary | ICD-10-CM

## 2021-03-28 DIAGNOSIS — E669 Obesity, unspecified: Secondary | ICD-10-CM

## 2021-03-28 DIAGNOSIS — E785 Hyperlipidemia, unspecified: Secondary | ICD-10-CM

## 2021-03-28 DIAGNOSIS — E559 Vitamin D deficiency, unspecified: Secondary | ICD-10-CM

## 2021-03-28 DIAGNOSIS — M1712 Unilateral primary osteoarthritis, left knee: Secondary | ICD-10-CM

## 2021-03-28 DIAGNOSIS — M5440 Lumbago with sciatica, unspecified side: Secondary | ICD-10-CM

## 2021-03-28 DIAGNOSIS — F341 Dysthymic disorder: Secondary | ICD-10-CM

## 2021-03-28 DIAGNOSIS — I10 Essential (primary) hypertension: Secondary | ICD-10-CM

## 2021-03-28 DIAGNOSIS — E1169 Type 2 diabetes mellitus with other specified complication: Secondary | ICD-10-CM

## 2021-03-28 DIAGNOSIS — E039 Hypothyroidism, unspecified: Secondary | ICD-10-CM

## 2021-03-28 DIAGNOSIS — G8929 Other chronic pain: Secondary | ICD-10-CM

## 2021-03-28 DIAGNOSIS — G4709 Other insomnia: Secondary | ICD-10-CM

## 2021-03-28 DIAGNOSIS — F419 Anxiety disorder, unspecified: Secondary | ICD-10-CM

## 2021-03-28 DIAGNOSIS — M5442 Lumbago with sciatica, left side: Secondary | ICD-10-CM

## 2021-03-28 DIAGNOSIS — E538 Deficiency of other specified B group vitamins: Secondary | ICD-10-CM

## 2021-03-28 LAB — POCT GLYCOSYLATED HEMOGLOBIN (HGB A1C): Hemoglobin A1C: 7.2 % — AB (ref 4.0–5.6)

## 2021-03-28 MED ORDER — TRAZODONE HCL 100 MG PO TABS: ORAL_TABLET | ORAL | 0 refills | Status: DC

## 2021-03-28 MED ORDER — DULOXETINE HCL 60 MG PO CPEP: 60.0000 mg | ORAL_CAPSULE | Freq: Every day | ORAL | 1 refills | Status: DC

## 2021-03-28 MED ORDER — HYDROCODONE-ACETAMINOPHEN 7.5-325 MG PO TABS: 1.0000 | ORAL_TABLET | Freq: Three times a day (TID) | ORAL | 0 refills | Status: DC | PRN

## 2021-03-28 MED ORDER — QUETIAPINE FUMARATE 25 MG PO TABS: 25.0000 mg | ORAL_TABLET | Freq: Every day | ORAL | 0 refills | Status: DC

## 2021-03-28 MED ORDER — VALSARTAN 320 MG PO TABS: 320.0000 mg | ORAL_TABLET | Freq: Every day | ORAL | 1 refills | Status: DC

## 2021-03-28 MED ORDER — RYBELSUS 7 MG PO TABS: 7.0000 mg | ORAL_TABLET | Freq: Every day | ORAL | 1 refills | Status: DC

## 2021-03-28 MED ORDER — PREGABALIN 225 MG PO CAPS: 225.0000 mg | ORAL_CAPSULE | Freq: Two times a day (BID) | ORAL | 1 refills | Status: DC

## 2021-03-28 MED ORDER — HYDROCODONE-ACETAMINOPHEN 7.5-325 MG PO TABS
1.0000 | ORAL_TABLET | Freq: Three times a day (TID) | ORAL | 0 refills | Status: DC | PRN
Start: 2021-03-28 — End: 2021-07-06

## 2021-03-30 LAB — CBC WITH DIFFERENTIAL/PLATELET
Absolute Monocytes: 668 cells/uL (ref 200–950)
Basophils Absolute: 32 cells/uL (ref 0–200)
Basophils Relative: 0.5 %
Eosinophils Absolute: 158 cells/uL (ref 15–500)
Eosinophils Relative: 2.5 %
HCT: 38.6 % (ref 38.5–50.0)
Hemoglobin: 12.3 g/dL — ABNORMAL LOW (ref 13.2–17.1)
Lymphs Abs: 1348 cells/uL (ref 850–3900)
MCH: 28.6 pg (ref 27.0–33.0)
MCHC: 31.9 g/dL — ABNORMAL LOW (ref 32.0–36.0)
MCV: 89.8 fL (ref 80.0–100.0)
MPV: 10.1 fL (ref 7.5–12.5)
Monocytes Relative: 10.6 %
Neutro Abs: 4095 cells/uL (ref 1500–7800)
Neutrophils Relative %: 65 %
Platelets: 194 10*3/uL (ref 140–400)
RBC: 4.3 10*6/uL (ref 4.20–5.80)
RDW: 12.6 % (ref 11.0–15.0)
Total Lymphocyte: 21.4 %
WBC: 6.3 10*3/uL (ref 3.8–10.8)

## 2021-03-30 LAB — DRUG MONITOR, PANEL 1, W/CONF, URINE
Amphetamines: NEGATIVE ng/mL (ref <500)
Barbiturates: NEGATIVE ng/mL (ref <300)
Benzodiazepines: NEGATIVE ng/mL (ref <100)
Cocaine Metabolite: NEGATIVE ng/mL (ref <150)
Codeine: NEGATIVE ng/mL (ref <50)
Creatinine: 115.1 mg/dL
Hydrocodone: 598 ng/mL — ABNORMAL HIGH (ref <50)
Hydromorphone: 126 ng/mL — ABNORMAL HIGH (ref <50)
Marijuana Metabolite: NEGATIVE ng/mL (ref <20)
Methadone Metabolite: NEGATIVE ng/mL (ref <100)
Morphine: NEGATIVE ng/mL (ref <50)
Norhydrocodone: 577 ng/mL — ABNORMAL HIGH (ref <50)
Opiates: POSITIVE ng/mL — AB (ref <100)
Oxidant: NEGATIVE ug/mL
Oxycodone: NEGATIVE ng/mL (ref <100)
Phencyclidine: NEGATIVE ng/mL (ref <25)
pH: 5.2 (ref 4.5–9.0)

## 2021-03-30 LAB — COMPLETE METABOLIC PANEL WITH GFR
AG Ratio: 1.9 (calc) (ref 1.0–2.5)
ALT: 15 U/L (ref 9–46)
AST: 17 U/L (ref 10–40)
Albumin: 4 g/dL (ref 3.6–5.1)
Alkaline phosphatase (APISO): 97 U/L (ref 36–130)
BUN/Creatinine Ratio: 20 (calc) (ref 6–22)
BUN: 26 mg/dL — ABNORMAL HIGH (ref 7–25)
CO2: 27 mmol/L (ref 20–32)
Calcium: 9.1 mg/dL (ref 8.6–10.3)
Chloride: 108 mmol/L (ref 98–110)
Creat: 1.29 mg/dL (ref 0.60–1.35)
GFR, Est African American: 75 mL/min/{1.73_m2} (ref >=60)
GFR, Est Non African American: 65 mL/min/{1.73_m2} (ref >=60)
Globulin: 2.1 g/dL (calc) (ref 1.9–3.7)
Glucose, Bld: 99 mg/dL (ref 65–139)
Potassium: 4.7 mmol/L (ref 3.5–5.3)
Sodium: 142 mmol/L (ref 135–146)
Total Bilirubin: 0.5 mg/dL (ref 0.2–1.2)
Total Protein: 6.1 g/dL (ref 6.1–8.1)

## 2021-03-30 LAB — DM TEMPLATE

## 2021-03-30 LAB — LIPID PANEL
Cholesterol: 88 mg/dL (ref <200)
HDL: 31 mg/dL — ABNORMAL LOW (ref >=40)
LDL Cholesterol (Calc): 40 mg/dL (calc)
Non-HDL Cholesterol (Calc): 57 mg/dL (calc) (ref <130)
Total CHOL/HDL Ratio: 2.8 (calc) (ref <5.0)
Triglycerides: 86 mg/dL (ref <150)

## 2021-03-30 LAB — TSH: TSH: 0.46 mIU/L (ref 0.40–4.50)

## 2021-03-30 LAB — VITAMIN D 25 HYDROXY (VIT D DEFICIENCY, FRACTURES): Vit D, 25-Hydroxy: 40 ng/mL (ref 30–100)

## 2021-03-30 LAB — MICROALBUMIN / CREATININE URINE RATIO
Creatinine, Urine: 128 mg/dL (ref 20–320)
Microalb Creat Ratio: 13 mcg/mg creat (ref <30)
Microalb, Ur: 1.7 mg/dL

## 2021-03-30 LAB — HEMOGLOBIN A1C
Hgb A1c MFr Bld: 7.2 % of total Hgb — ABNORMAL HIGH (ref <5.7)
Mean Plasma Glucose: 160 mg/dL
eAG (mmol/L): 8.9 mmol/L

## 2021-04-09 ENCOUNTER — Other Ambulatory Visit: Payer: Self-pay | Admitting: Family Medicine

## 2021-04-09 NOTE — Telephone Encounter (Signed)
Requested medication (s) are due for refill today: no  Requested medication (s) are on the active medication list: yes  Last refill:  11/22/2021  Future visit scheduled:yes  Notes to clinic: Failed protocol; Mg Level in normal range and within 360 days   Requested Prescriptions  Pending Prescriptions Disp Refills   Magnesium Oxide 500 MG TABS [Pharmacy Med Name: MAGNESIUM 500MG  TABLETS] 90 tablet 0    Sig: TAKE 1 TABLET(500 MG) BY MOUTH DAILY      Endocrinology:  Minerals - Magnesium Supplementation Failed - 04/09/2021  6:27 AM      Failed - Mg Level in normal range and within 360 days    No results found for: MG        Passed - Valid encounter within last 12 months    Recent Outpatient Visits           1 week ago Diabetes mellitus type 2 in obese [facility])   [facility] [name], MD   3 months ago [facility] [name], MD   4 months ago [facility] [place], Drue Stager, MD   6 months ago Diabetes mellitus type 2 in obese [facility])   [facility] [name], MD   8 months ago [facility] [name], MD       Future Appointments             In 2 months Ancil Boozer, Drue Stager, MD [facility], [facility]

## 2021-05-18 ENCOUNTER — Other Ambulatory Visit: Payer: Self-pay | Admitting: Family Medicine

## 2021-05-18 DIAGNOSIS — F341 Dysthymic disorder: Secondary | ICD-10-CM

## 2021-05-22 ENCOUNTER — Ambulatory Visit: Payer: No Typology Code available for payment source | Admitting: Family Medicine

## 2021-06-29 ENCOUNTER — Ambulatory Visit: Payer: Self-pay

## 2021-06-29 NOTE — Telephone Encounter (Signed)
Pt.'s wife reports pt. Was in his work shop and fainted, hit the back of his head. Has a bump to back of head, Unwitnessed. Has been feeling dizzy recently, concerned it could be related to BP medications. No availability in the practice. Will go to ED. Would like to be worked in this month for a regular follow up for BP and medication check. Please advise.

## 2021-06-29 NOTE — Telephone Encounter (Signed)
Spoke to [facility] this morning patient was advised to go to ER. Patient had fainted and hit his head doing the fall

## 2021-06-29 NOTE — Telephone Encounter (Signed)
Reason for Disposition  [1] All other patients AND [2] now alert and feels fine  (Exception: SIMPLE FAINT due to stress, pain, prolonged standing, or suddenly standing)  Answer Assessment - Initial Assessment Questions 1. ONSET: "How long were you unconscious?" (minutes) "When did it happen?"     This morning 2. CONTENT: "What happened during period of unconsciousness?" (e.g., seizure activity)      Unsure 3. MENTAL STATUS: "Alert and oriented now?" (oriented x 3 = name, month, location)      Alert 4. TRIGGER: "What do you think caused the fainting?" "What were you doing just before you fainted?"  (e.g., exercise, sudden standing up, prolonged standing)     Outside 5. RECURRENT SYMPTOM: "Have you ever passed out before?" If Yes, ask: "When was the last time?" and "What happened that time?"      No 6. INJURY: "Did you sustain any injury during the fall?"      Hit back of head 7. CARDIAC SYMPTOMS: "Have you had any of the following symptoms: chest pain, difficulty breathing, palpitations?"     No 8. NEUROLOGIC SYMPTOMS: "Have you had any of the following symptoms: headache, numbness, vertigo, weakness?"     Dizzy 9. GI SYMPTOMS: "Have you had any of the following symptoms: abdominal pain, vomiting, diarrhea, blood in stools?"     No 10. OTHER SYMPTOMS: "Do you have any other symptoms?"       No 11. PREGNANCY: "Is there any chance you are pregnant?" "When was your last menstrual period?"       N/a  Protocols used: Fainting-A-AH
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
## 2021-07-06 ENCOUNTER — Encounter: Payer: Self-pay | Admitting: Family Medicine

## 2021-07-06 ENCOUNTER — Other Ambulatory Visit: Payer: Self-pay

## 2021-07-06 ENCOUNTER — Ambulatory Visit (INDEPENDENT_AMBULATORY_CARE_PROVIDER_SITE_OTHER): Payer: Managed Care, Other (non HMO) | Admitting: Family Medicine

## 2021-07-06 VITALS — BP 120/78 | HR 78 | Temp 97.9°F | Resp 16 | Ht 69.0 in | Wt 333.0 lb

## 2021-07-06 DIAGNOSIS — R252 Cramp and spasm: Secondary | ICD-10-CM

## 2021-07-06 DIAGNOSIS — M5442 Lumbago with sciatica, left side: Secondary | ICD-10-CM

## 2021-07-06 DIAGNOSIS — E559 Vitamin D deficiency, unspecified: Secondary | ICD-10-CM

## 2021-07-06 DIAGNOSIS — E1159 Type 2 diabetes mellitus with other circulatory complications: Secondary | ICD-10-CM

## 2021-07-06 DIAGNOSIS — E119 Type 2 diabetes mellitus without complications: Secondary | ICD-10-CM

## 2021-07-06 DIAGNOSIS — G4733 Obstructive sleep apnea (adult) (pediatric): Secondary | ICD-10-CM | POA: Diagnosis not present

## 2021-07-06 DIAGNOSIS — E669 Obesity, unspecified: Secondary | ICD-10-CM | POA: Diagnosis not present

## 2021-07-06 DIAGNOSIS — G4709 Other insomnia: Secondary | ICD-10-CM

## 2021-07-06 DIAGNOSIS — E039 Hypothyroidism, unspecified: Secondary | ICD-10-CM

## 2021-07-06 DIAGNOSIS — M5440 Lumbago with sciatica, unspecified side: Secondary | ICD-10-CM

## 2021-07-06 DIAGNOSIS — E1169 Type 2 diabetes mellitus with other specified complication: Secondary | ICD-10-CM

## 2021-07-06 DIAGNOSIS — F341 Dysthymic disorder: Secondary | ICD-10-CM

## 2021-07-06 DIAGNOSIS — I10 Essential (primary) hypertension: Secondary | ICD-10-CM

## 2021-07-06 DIAGNOSIS — Z630 Problems in relationship with spouse or partner: Secondary | ICD-10-CM

## 2021-07-06 DIAGNOSIS — E538 Deficiency of other specified B group vitamins: Secondary | ICD-10-CM

## 2021-07-06 DIAGNOSIS — I152 Hypertension secondary to endocrine disorders: Secondary | ICD-10-CM

## 2021-07-06 DIAGNOSIS — G8929 Other chronic pain: Secondary | ICD-10-CM

## 2021-07-06 DIAGNOSIS — F419 Anxiety disorder, unspecified: Secondary | ICD-10-CM

## 2021-07-06 DIAGNOSIS — M5441 Lumbago with sciatica, right side: Secondary | ICD-10-CM

## 2021-07-06 DIAGNOSIS — E785 Hyperlipidemia, unspecified: Secondary | ICD-10-CM

## 2021-07-06 DIAGNOSIS — M109 Gout, unspecified: Secondary | ICD-10-CM

## 2021-07-06 DIAGNOSIS — Z9884 Bariatric surgery status: Secondary | ICD-10-CM

## 2021-07-06 LAB — POCT GLYCOSYLATED HEMOGLOBIN (HGB A1C): Hemoglobin A1C: 6.6 % — AB (ref 4.0–5.6)

## 2021-07-06 MED ORDER — VALSARTAN 320 MG PO TABS: 320.0000 mg | ORAL_TABLET | Freq: Every day | ORAL | 1 refills | Status: DC

## 2021-07-06 MED ORDER — SYNTHROID 137 MCG PO TABS: ORAL_TABLET | ORAL | 1 refills | Status: DC

## 2021-07-06 MED ORDER — PREGABALIN 225 MG PO CAPS: 225.0000 mg | ORAL_CAPSULE | Freq: Two times a day (BID) | ORAL | 1 refills | Status: DC

## 2021-07-06 MED ORDER — HYDROCODONE-ACETAMINOPHEN 7.5-325 MG PO TABS: 1.0000 | ORAL_TABLET | Freq: Three times a day (TID) | ORAL | 0 refills | Status: DC | PRN

## 2021-07-06 MED ORDER — DULOXETINE HCL 60 MG PO CPEP: 60.0000 mg | ORAL_CAPSULE | Freq: Every day | ORAL | 1 refills | Status: DC

## 2021-07-06 MED ORDER — TRAZODONE HCL 100 MG PO TABS: ORAL_TABLET | ORAL | 0 refills | Status: DC

## 2021-07-06 MED ORDER — CYCLOBENZAPRINE HCL 10 MG PO TABS: ORAL_TABLET | ORAL | 1 refills | Status: DC

## 2021-07-06 MED ORDER — ROSUVASTATIN CALCIUM 5 MG PO TABS: 5.0000 mg | ORAL_TABLET | Freq: Every day | ORAL | 1 refills | Status: DC

## 2021-07-06 MED ORDER — RYBELSUS 7 MG PO TABS
7.0000 mg | ORAL_TABLET | Freq: Every day | ORAL | 1 refills | Status: DC
Start: 2021-07-06 — End: 2021-12-03

## 2021-07-06 MED ORDER — TRAZODONE HCL 100 MG PO TABS
ORAL_TABLET | ORAL | 0 refills | Status: DC
Start: 2021-07-06 — End: 2021-12-03

## 2021-07-06 MED ORDER — QUETIAPINE FUMARATE 25 MG PO TABS: 25.0000 mg | ORAL_TABLET | Freq: Every day | ORAL | 1 refills | Status: DC

## 2021-07-06 MED ORDER — METFORMIN HCL ER 750 MG PO TB24: 750.0000 mg | ORAL_TABLET | Freq: Every day | ORAL | 1 refills | Status: DC

## 2021-07-06 MED ORDER — ALLOPURINOL 100 MG PO TABS: ORAL_TABLET | ORAL | 1 refills | Status: DC

## 2021-07-06 NOTE — Addendum Note (Signed)
Addended by: [name] F on: 07/06/2021 10:54 AM   Modules accepted: Orders

## 2021-07-09 ENCOUNTER — Telehealth: Payer: Self-pay | Admitting: *Deleted

## 2021-07-09 NOTE — Chronic Care Management (AMB) (Signed)
  Care Management   Outreach Note  07/09/2021 Name: [name] MRN: [id number] DOB: [birth year]  Referred by: [name], MD Reason for referral : Care Coordination (Initial outreach to schedule referral for Licensed Clinical SW )   An unsuccessful telephone outreach was attempted today. The patient was referred to the case management team for assistance with care management and care coordination.   Follow Up Plan: A HIPAA compliant phone message was left for the patient providing contact information and requesting a return call. The care management team will reach out to the patient again over the next 7 days.  If patient returns call to provider office, please advise to call [place] at [phone].  [facility]

## 2021-07-16 NOTE — Chronic Care Management (AMB) (Signed)
  Care Management   Outreach Note  07/16/2021 Name: [name] MRN: [id number] DOB: [birth year]  Referred by: [name], MD Reason for referral : Care Coordination (Initial outreach to schedule referral for Licensed Clinical SW )   A second telephone outreach was attempted today patient ask me to call spouse to schedule appointment. The patient was referred to the case management team for assistance with care management and care coordination.   Follow Up Plan: A HIPAA compliant phone message was left for the patient providing contact information and requesting a return call. The care management team will reach out to the patient again over the next 7 days.  If patient returns call to provider office, please advise to call [place] at [phone].  [facility]  Direct Dial: [phone]

## 2021-07-16 NOTE — Chronic Care Management (AMB) (Signed)
  Care Management   Note  07/16/2021 Name: [name] MRN: [id number] DOB: July 31, 1971  [name] is a 49 y.o. year old male who is a primary care patient of [name], MD. I reached out to [name] by phone today in response to a referral sent by Mr. [name]'s PCP, Dr. Ancil Boozer.    [name] was given information about care management services today including:  Care management services include personalized support from designated clinical staff supervised by his physician, including individualized plan of care and coordination with other care providers 24/7 contact phone numbers for assistance for urgent and routine care needs. The patient may stop care management services at any time by phone call to the office staff.  Patient agreed to services and verbal consent obtained.   Follow up plan: Telephone appointment with care management team member scheduled for:07/23/2021  [name]  Care Guide, Embedded Care Coordination [facility]  Care Management  Direct Dial: [phone]

## 2021-07-23 ENCOUNTER — Other Ambulatory Visit: Payer: Self-pay | Admitting: Family Medicine

## 2021-07-23 ENCOUNTER — Telehealth: Payer: Managed Care, Other (non HMO)

## 2021-07-23 DIAGNOSIS — F419 Anxiety disorder, unspecified: Secondary | ICD-10-CM

## 2021-07-23 DIAGNOSIS — F341 Dysthymic disorder: Secondary | ICD-10-CM

## 2021-07-24 ENCOUNTER — Ambulatory Visit: Payer: Managed Care, Other (non HMO) | Admitting: *Deleted

## 2021-07-24 DIAGNOSIS — F419 Anxiety disorder, unspecified: Secondary | ICD-10-CM

## 2021-07-24 DIAGNOSIS — F341 Dysthymic disorder: Secondary | ICD-10-CM

## 2021-07-24 NOTE — Patient Instructions (Addendum)
Visit Information  PATIENT GOALS:   Goals Addressed             This Visit's Progress    Manage My Emotions       Timeframe:  Long-Range Goal Priority:  Medium Start Date: 07/24/21                            Expected End Date:     01/24/21                  Follow Up Date 08/07/21    - begin personal counseling - talk about feelings with a friend, family or spiritual advisor - practice positive thinking and self-talk    Why is this important?   When you are stressed, down or upset, your body reacts too.  For example, your blood pressure may get higher; you may have a headache or stomachache.  When your emotions get the best of you, your body's ability to fight off cold and flu gets weak.  These steps will help you manage your emotions.     Notes:         Mr. Ek was given information about Care Management services by the embedded care coordination team including:  Care Management services include personalized support from designated clinical staff supervised by his physician, including individualized plan of care and coordination with other care providers 24/7 contact phone numbers for assistance for urgent and routine care needs. The patient may stop CCM services at any time (effective at the end of the month) by phone call to the office staff.  Patient agreed to services and verbal consent obtained.   The patient verbalized understanding of instructions, educational materials, and care plan provided today and declined offer to receive copy of patient instructions, educational materials, and care plan.   Telephone follow up appointment with care management team member scheduled for: 08/06/21   Elliot Gurney, [place] Worker  [facility] Care Management [phone]

## 2021-07-24 NOTE — Chronic Care Management (AMB) (Addendum)
Care Management Clinical Social Work Note  07/24/2021 Name: [name] MRN: [id number] DOB: 1971-01-10  [name] is a 50 y.o. year old male who is a primary care patient of [name], MD.  The Care Management team was consulted for assistance with chronic disease management and coordination needs.  Engaged with patient by telephone for initial visit in response to provider referral for social work chronic care management and care coordination services  Consent to Services:  [name] was given information about Care Management services today including:  Care Management services includes personalized support from designated clinical staff supervised by his physician, including individualized plan of care and coordination with other care providers 24/7 contact phone numbers for assistance for urgent and routine care needs. The patient may stop case management services at any time by phone call to the office staff.  Patient agreed to services and consent obtained.   Assessment: Review of patient past medical history, allergies, medications, and health status, including review of relevant consultants reports was performed today as part of a comprehensive evaluation and provision of chronic care management and care coordination services.  SDOH (Social Determinants of Health) assessments and interventions performed:    Advanced Directives Status: Not addressed in this encounter.  Care Plan  Allergies  Allergen Reactions   Morphine Itching    Outpatient Encounter Medications as of 07/24/2021  Medication Sig   allopurinol (ZYLOPRIM) 100 MG tablet TAKE 1 TABLET(100 MG) BY MOUTH DAILY   blood glucose meter kit and supplies Dispense based on patient and insurance preference. Use up to four times daily as directed. (FOR ICD-10 E10.9, E11.9).   busPIRone (BUSPAR) 5 MG tablet TAKE 1 TO 2 TABLETS(5 TO 10 MG) BY MOUTH THREE TIMES DAILY   cyclobenzaprine (FLEXERIL) 10 MG tablet  TAKE 1 TABLET(10 MG) BY MOUTH AT BEDTIME   DULoxetine (CYMBALTA) 60 MG capsule Take 1 capsule (60 mg total) by mouth daily.   fexofenadine (ALLEGRA) 180 MG tablet TAKE 1 TABLET(180 MG TOTAL) BY MOUTH DAILY   HYDROcodone-acetaminophen (NORCO) 7.5-325 MG tablet Take 1 tablet by mouth 3 (three) times daily as needed.   HYDROcodone-acetaminophen (NORCO) 7.5-325 MG tablet Take 1 tablet by mouth 3 (three) times daily as needed for moderate pain.   HYDROcodone-acetaminophen (NORCO) 7.5-325 MG tablet Take 1 tablet by mouth 3 (three) times daily as needed for moderate pain.   metFORMIN (GLUCOPHAGE-XR) 750 MG 24 hr tablet Take 1 tablet (750 mg total) by mouth daily with breakfast.   pregabalin (LYRICA) 225 MG capsule Take 1 capsule (225 mg total) by mouth 2 (two) times daily.   QUEtiapine (SEROQUEL) 25 MG tablet Take 1 tablet (25 mg total) by mouth at bedtime.   rosuvastatin (CRESTOR) 5 MG tablet Take 1 tablet (5 mg total) by mouth daily.   Semaglutide (RYBELSUS) 7 MG TABS Take 7 mg by mouth daily.   SYNTHROID 137 MCG tablet TAKE 1 TABLET BY MOUTH EVERY DAY AND 1 AND 1/2 ON SUNDAYS   traZODone (DESYREL) 100 MG tablet TAKE 1 AND 1/2 TABLETS(150 MG) BY MOUTH AT BEDTIME   valsartan (DIOVAN) 320 MG tablet Take 1 tablet (320 mg total) by mouth daily.   No facility-administered encounter medications on file as of 07/24/2021.    Patient Active Problem List   Diagnosis Date Noted   Chronic midline low back pain with sciatica 02/11/2017   Anemia, unspecified 06/12/2016   B12 deficiency 06/11/2016   Allergic conjunctivitis 06/11/2016   Hypocalcemia 06/11/2016   Muscle  cramps 06/11/2016   Hypertension, benign 11/03/2015   Dyslipidemia 11/02/2015   Acquired nystagmus 08/01/2015   Chronic back pain 06/29/2015   Carpal tunnel syndrome 06/29/2015   Osteoarthritis 06/29/2015   Claustrophobia 06/29/2015   Foot drop, left 06/29/2015   Hiatal hernia 06/29/2015   H/O diabetes mellitus 06/29/2015   H/O testicular  cancer 06/29/2015   Acquired hypothyroidism 83/38/2505   Dysmetabolic syndrome 39/76/7341   Morbid obesity ([place]) 06/29/2015   Allergic rhinitis 06/29/2015   Neuropathy 06/29/2015   Seborrheic keratoses 06/29/2015   Vitamin D deficiency 06/29/2015   Bariatric surgery status 02/20/2012    Conditions to be addressed/monitored: Anxiety and Depression; Mental Health Concerns   Care Plan : Anxiety (Adult)  Updates made by [name], LCSW since 07/24/2021 12:00 AM     Problem: Symptoms (Anxiety)      Goal: Anxiety Symptoms Monitored and Managed   Start Date: 07/24/2021  Expected End Date: 01/24/2022  Priority: Medium  Note:   Current Barriers:  Chronic Mental Health needs related to family and job stress Mental Health Concerns  Suicidal Ideation/Homicidal Ideation: No  Clinical Social Work Goal(s):  Over the next 90 days, patient will work with SW bi-weekly by telephone or in person to reduce or manage symptoms related to anxiety and depression patient will follow up with a Licensed conveyancer for individual and couples counseling as directed by SW  Interventions: Patient interviewed and appropriate assessments performed: PHQ 2 PHQ 9 Patient's spouse on the call with patient's permission-family demands and work stress explored, with medication adherence stated as beneficial  Current coping skills explored, mental health follow up strongly suggested  Referral to  [place] re: mental heath follow up completed Active listening / Reflection utilized Emotional Support Provided  Participation in counseling encouraged-individual/counseling Verbalization of feelings encouraged   Patient Self Care Activities:  Performs ADL's independently Performs IADL's independently Motivation for treatment Strong family or social support Patient adherent to medication therapy  Patient Coping Strengths:  Supportive Relationships Family  Patient Self Care Deficits:   Knowledge deficit of local in-network mental health providers  Initial goal documentation            Task: Alleviate Barriers to Anxiety Treatment Success        Follow Up Plan: SW will follow up with patient by phone over the next 14 business days  [place], [place] Worker  [facility] Care Management [phone]

## 2021-08-06 ENCOUNTER — Ambulatory Visit: Payer: Managed Care, Other (non HMO) | Admitting: *Deleted

## 2021-08-06 NOTE — Chronic Care Management (AMB) (Addendum)
Care Management Clinical Social Work Note  08/06/2021 Name: [name] MRN: [id number] DOB: 1971/09/10  [name] is a 50 y.o. year old male who is a primary care patient of [name], MD.  The Care Management team was consulted for assistance with chronic disease management and coordination needs.  Collaboration with patient's spouse  for follow up visit in response to provider referral for social work chronic care management and care coordination services  Assessment: Review of patient past medical history, allergies, medications, and health status, including review of relevant consultants reports was performed today as part of a comprehensive evaluation and provision of chronic care management and care coordination services.  SDOH (Social Determinants of Health) assessments and interventions performed:    Advanced Directives Status: Not addressed in this encounter.  Care Plan  Allergies  Allergen Reactions   Morphine Itching    Outpatient Encounter Medications as of 08/06/2021  Medication Sig   allopurinol (ZYLOPRIM) 100 MG tablet TAKE 1 TABLET(100 MG) BY MOUTH DAILY   blood glucose meter kit and supplies Dispense based on patient and insurance preference. Use up to four times daily as directed. (FOR ICD-10 E10.9, E11.9).   busPIRone (BUSPAR) 5 MG tablet TAKE 1 TO 2 TABLETS(5 TO 10 MG) BY MOUTH THREE TIMES DAILY   cyclobenzaprine (FLEXERIL) 10 MG tablet TAKE 1 TABLET(10 MG) BY MOUTH AT BEDTIME   DULoxetine (CYMBALTA) 60 MG capsule Take 1 capsule (60 mg total) by mouth daily.   fexofenadine (ALLEGRA) 180 MG tablet TAKE 1 TABLET(180 MG TOTAL) BY MOUTH DAILY   HYDROcodone-acetaminophen (NORCO) 7.5-325 MG tablet Take 1 tablet by mouth 3 (three) times daily as needed.   HYDROcodone-acetaminophen (NORCO) 7.5-325 MG tablet Take 1 tablet by mouth 3 (three) times daily as needed for moderate pain.   HYDROcodone-acetaminophen (NORCO) 7.5-325 MG tablet Take 1 tablet by mouth  3 (three) times daily as needed for moderate pain.   metFORMIN (GLUCOPHAGE-XR) 750 MG 24 hr tablet Take 1 tablet (750 mg total) by mouth daily with breakfast.   pregabalin (LYRICA) 225 MG capsule Take 1 capsule (225 mg total) by mouth 2 (two) times daily.   QUEtiapine (SEROQUEL) 25 MG tablet Take 1 tablet (25 mg total) by mouth at bedtime.   rosuvastatin (CRESTOR) 5 MG tablet Take 1 tablet (5 mg total) by mouth daily.   Semaglutide (RYBELSUS) 7 MG TABS Take 7 mg by mouth daily.   SYNTHROID 137 MCG tablet TAKE 1 TABLET BY MOUTH EVERY DAY AND 1 AND 1/2 ON SUNDAYS   traZODone (DESYREL) 100 MG tablet TAKE 1 AND 1/2 TABLETS(150 MG) BY MOUTH AT BEDTIME   valsartan (DIOVAN) 320 MG tablet Take 1 tablet (320 mg total) by mouth daily.   No facility-administered encounter medications on file as of 08/06/2021.    Patient Active Problem List   Diagnosis Date Noted   Chronic midline low back pain with sciatica 02/11/2017   Anemia, unspecified 06/12/2016   B12 deficiency 06/11/2016   Allergic conjunctivitis 06/11/2016   Hypocalcemia 06/11/2016   Muscle cramps 06/11/2016   Hypertension, benign 11/03/2015   Dyslipidemia 11/02/2015   Acquired nystagmus 08/01/2015   Chronic back pain 06/29/2015   Carpal tunnel syndrome 06/29/2015   Osteoarthritis 06/29/2015   Claustrophobia 06/29/2015   Foot drop, left 06/29/2015   Hiatal hernia 06/29/2015   H/O diabetes mellitus 06/29/2015   H/O testicular cancer 06/29/2015   Acquired hypothyroidism 84/66/5993   Dysmetabolic syndrome 57/12/7791   Morbid obesity ([place]) 06/29/2015   Allergic rhinitis  06/29/2015   Neuropathy 06/29/2015   Seborrheic keratoses 06/29/2015   Vitamin D deficiency 06/29/2015   Bariatric surgery status 02/20/2012    Conditions to be addressed/monitored: Depression; Mental Health Concerns   Care Plan : Anxiety (Adult)  Updates made by [name], LCSW since 08/06/2021 12:00 AM     Problem: Symptoms (Anxiety)      Goal:  Anxiety Symptoms Monitored and Managed   Start Date: 07/24/2021  Expected End Date: 01/24/2022  Priority: Medium  Note:   Current Barriers:  Chronic Mental Health needs related to family and job stress Mental Health Concerns  Suicidal Ideation/Homicidal Ideation: No  Clinical Social Work Goal(s):  Over the next 90 days, patient will work with SW bi-weekly by telephone or in person to reduce or manage symptoms related to anxiety and depression patient will follow up with a mental health counselor for individual and couples counseling as directed by SW  Interventions: Phone call to patient to follow up on referral for individual and couples counseling Patient's spouse answered (spouse is on [place]) stating that patient has now changed his mind and does not want to engage in counseling at this time Multiple psycho-social issues discussed which are now a priority per patient's spouse Patient's spouse able to confirm some positive changes observed in patient's behavior Current coping skills explored, mental health follow up continues to be recommended and will be available when patient verbalizes readiness to participate Active listening / Reflection utilized Emotional Support Provided This social worker to follow up with patient to assess for additional community resources within 30 days.   Patient Self Care Activities:  Performs ADL's independently Performs IADL's independently Motivation for treatment Strong family or social support Patient adherent to medication therapy  Patient Coping Strengths:  Supportive Relationships Family  Patient Self Care Deficits:  Knowledge deficit of local in-network mental health providers  Please see past updates related to this goal by clicking on the "Past Updates" button in the selected goal               Follow Up Plan: SW will follow up with patient by phone over the next 30 business days  [place], [place] Worker   [facility] Care Management [phone]

## 2021-08-06 NOTE — Patient Instructions (Signed)
Visit Information   Goals Addressed             This Visit's Progress    Manage My Emotions       Timeframe:  Long-Range Goal Priority:  Medium Start Date: 07/24/21                            Expected End Date:     08/06/21                Follow Up Date 08/06/21    - begin personal counseling when ready - talk about feelings with a friend, family or spiritual advisor - practice positive thinking and self-talk    Why is this important?   When you are stressed, down or upset, your body reacts too.  For example, your blood pressure may get higher; you may have a headache or stomachache.  When your emotions get the best of you, your body's ability to fight off cold and flu gets weak.  These steps will help you manage your emotions.     Notes:         The patient verbalized understanding of instructions, educational materials, and care plan provided today and declined offer to receive copy of patient instructions, educational materials, and care plan.   Telephone follow up appointment with care management team member scheduled for:  [facility] 09/07/21

## 2021-08-16 ENCOUNTER — Other Ambulatory Visit: Payer: Self-pay | Admitting: Family Medicine

## 2021-08-16 DIAGNOSIS — J302 Other seasonal allergic rhinitis: Secondary | ICD-10-CM

## 2021-08-16 NOTE — Telephone Encounter (Signed)
Requested Prescriptions  Pending Prescriptions Disp Refills  . ALLERGY RELIEF 180 MG tablet [Pharmacy Med Name: FEXOFENADINE '180MG'$  TABLETS (OTC)] 90 tablet 0    Sig: TAKE 1 TABLET(180 MG TOTAL) BY MOUTH DAILY     Ear, Nose, and Throat:  Antihistamines Passed - 08/16/2021  6:34 AM      Passed - Valid encounter within last 12 months    Recent Outpatient Visits          1 month ago Diabetes mellitus type 2 in obese [facility])   [facility] [place], Drue Stager, MD   4 months ago Diabetes mellitus type 2 in obese [facility])   [facility] [name], MD   7 months ago [facility] [name], MD   8 months ago [facility] [place], Drue Stager, MD   10 months ago Diabetes mellitus type 2 in obese [facility])   [facility] [name], MD

## 2021-08-28 ENCOUNTER — Other Ambulatory Visit: Payer: Self-pay | Admitting: Family Medicine

## 2021-08-28 DIAGNOSIS — E669 Obesity, unspecified: Secondary | ICD-10-CM

## 2021-08-28 DIAGNOSIS — E1169 Type 2 diabetes mellitus with other specified complication: Secondary | ICD-10-CM

## 2021-09-07 ENCOUNTER — Ambulatory Visit: Payer: Managed Care, Other (non HMO) | Admitting: *Deleted

## 2021-09-07 NOTE — Patient Instructions (Signed)
Visit Information   Goals Addressed             This Visit's Progress    Manage My Emotions       Timeframe:  Long-Range Goal Priority:  Medium Start Date: 07/24/21                            Expected End Date:     09/07/21               Follow Up Date 09/07/21   - begin personal counseling when ready - talk about feelings with a friend, family or spiritual advisor - practice positive thinking and self-talk    Why is this important?   When you are stressed, down or upset, your body reacts too.  For example, your blood pressure may get higher; you may have a headache or stomachache.  When your emotions get the best of you, your body's ability to fight off cold and flu gets weak.  These steps will help you manage your emotions.     Notes:         The patient verbalized understanding of instructions, educational materials, and care plan provided today and declined offer to receive copy of patient instructions, educational materials, and care plan.   No further follow up required: patient to contact this Education officer, museum with any additional community resource needs  [employer], [place] Worker  [facility] Care Management [phone]

## 2021-09-07 NOTE — Chronic Care Management (AMB) (Signed)
Care Management Clinical Social Work Note  09/07/2021 Name: [name] MRN: [id number] DOB: 09-03-1971  [name] is a 50 y.o. year old male who is a primary care patient of [name], MD.  The Care Management team was consulted for assistance with chronic disease management and coordination needs.  Collaboration with patient's spouse  for follow up visit in response to provider referral for social work chronic care management and care coordination services  Assessment: Review of patient past medical history, allergies, medications, and health status, including review of relevant consultants reports was performed today as part of a comprehensive evaluation and provision of chronic care management and care coordination services.  SDOH (Social Determinants of Health) assessments and interventions performed:    Advanced Directives Status: Not addressed in this encounter.  Care Plan  Allergies  Allergen Reactions   Morphine Itching    Outpatient Encounter Medications as of 09/07/2021  Medication Sig   ALLERGY RELIEF 180 MG tablet TAKE 1 TABLET(180 MG TOTAL) BY MOUTH DAILY   allopurinol (ZYLOPRIM) 100 MG tablet TAKE 1 TABLET(100 MG) BY MOUTH DAILY   blood glucose meter kit and supplies Dispense based on patient and insurance preference. Use up to four times daily as directed. (FOR ICD-10 E10.9, E11.9).   busPIRone (BUSPAR) 5 MG tablet TAKE 1 TO 2 TABLETS(5 TO 10 MG) BY MOUTH THREE TIMES DAILY   cyclobenzaprine (FLEXERIL) 10 MG tablet TAKE 1 TABLET(10 MG) BY MOUTH AT BEDTIME   DULoxetine (CYMBALTA) 60 MG capsule Take 1 capsule (60 mg total) by mouth daily.   HYDROcodone-acetaminophen (NORCO) 7.5-325 MG tablet Take 1 tablet by mouth 3 (three) times daily as needed.   HYDROcodone-acetaminophen (NORCO) 7.5-325 MG tablet Take 1 tablet by mouth 3 (three) times daily as needed for moderate pain.   HYDROcodone-acetaminophen (NORCO) 7.5-325 MG tablet Take 1 tablet by mouth 3  (three) times daily as needed for moderate pain.   metFORMIN (GLUCOPHAGE-XR) 750 MG 24 hr tablet Take 1 tablet (750 mg total) by mouth daily with breakfast.   pregabalin (LYRICA) 225 MG capsule Take 1 capsule (225 mg total) by mouth 2 (two) times daily.   QUEtiapine (SEROQUEL) 25 MG tablet Take 1 tablet (25 mg total) by mouth at bedtime.   rosuvastatin (CRESTOR) 5 MG tablet Take 1 tablet (5 mg total) by mouth daily.   Semaglutide (RYBELSUS) 7 MG TABS Take 7 mg by mouth daily.   SYNTHROID 137 MCG tablet TAKE 1 TABLET BY MOUTH EVERY DAY AND 1 AND 1/2 ON SUNDAYS   traZODone (DESYREL) 100 MG tablet TAKE 1 AND 1/2 TABLETS(150 MG) BY MOUTH AT BEDTIME   valsartan (DIOVAN) 320 MG tablet Take 1 tablet (320 mg total) by mouth daily.   No facility-administered encounter medications on file as of 09/07/2021.    Patient Active Problem List   Diagnosis Date Noted   Chronic midline low back pain with sciatica 02/11/2017   Anemia, unspecified 06/12/2016   B12 deficiency 06/11/2016   Allergic conjunctivitis 06/11/2016   Hypocalcemia 06/11/2016   Muscle cramps 06/11/2016   Hypertension, benign 11/03/2015   Dyslipidemia 11/02/2015   Acquired nystagmus 08/01/2015   Chronic back pain 06/29/2015   Carpal tunnel syndrome 06/29/2015   Osteoarthritis 06/29/2015   Claustrophobia 06/29/2015   Foot drop, left 06/29/2015   Hiatal hernia 06/29/2015   H/O diabetes mellitus 06/29/2015   H/O testicular cancer 06/29/2015   Acquired hypothyroidism 33/54/5625   Dysmetabolic syndrome 63/89/3734   Morbid obesity ([place]) 06/29/2015   Allergic rhinitis  06/29/2015   Neuropathy 06/29/2015   Seborrheic keratoses 06/29/2015   Vitamin D deficiency 06/29/2015   Bariatric surgery status 02/20/2012    Conditions to be addressed/monitored: Anxiety; Mental Health Concerns  and Family and relationship dysfunction  Care Plan : Anxiety (Adult)  Updates made by [name], LCSW since 09/07/2021 12:00 AM     Problem:  Symptoms (Anxiety)      Goal: Anxiety Symptoms Monitored and Managed   Start Date: 07/24/2021  Expected End Date: 01/24/2022  Priority: Medium  Note:   Current Barriers:  Chronic Mental Health needs related to family and job stress Mental Health Concerns  Suicidal Ideation/Homicidal Ideation: No  Clinical Social Work Goal(s):  Over the next 90 days, patient will work with SW bi-weekly by telephone or in person to reduce or manage symptoms related to anxiety and depression patient will follow up with a mental health counselor for individual and couples counseling as directed by SW  Interventions: Phone call to patient to follow up on referral for individual and couples counseling Patient's spouse answered (spouse is on [place]) stating that patient continues to decline counseling at this time Confirmed that patient's work schedule has change and he now works 8am-5pm Patient's spouse able to confirm continued  positive changes  in patient's behavior with the change in work schedule being beneficial Spouse further confirms that family relationships in general have improved Current coping skills explored, mental health follow up continues to be recommended and will be available when patient verbalizes readiness to participate Active listening / Reflection utilized Emotional Support Provided Patient/patient's spouse encouraged to contact this socia Education officer, museum with any additional community resource needs.   Patient Self Care Activities:  Performs ADL's independently Performs IADL's independently Motivation for treatment Strong family or social support Patient adherent to medication therapy  Patient Coping Strengths:  Supportive Relationships Family  Patient Self Care Deficits:  Knowledge deficit of local in-network mental health providers  Please see past updates related to this goal by clicking on the "Past Updates" button in the selected goal               Follow Up Plan:  Client will contact this social worker with any additional community resource needs  [place], [place] Worker  [facility] Care Management [phone]

## 2021-09-21 LAB — HM DIABETES EYE EXAM

## 2021-11-14 ENCOUNTER — Other Ambulatory Visit: Payer: Self-pay | Admitting: Family Medicine

## 2021-11-14 DIAGNOSIS — J302 Other seasonal allergic rhinitis: Secondary | ICD-10-CM

## 2021-11-14 NOTE — Telephone Encounter (Signed)
Requested Prescriptions  Pending Prescriptions Disp Refills  . ALLERGY RELIEF 180 MG tablet [Pharmacy Med Name: FEXOFENADINE 180MG  TABLETS (OTC)] 90 tablet 0    Sig: TAKE 1 TABLET(180 MG) BY MOUTH DAILY     Ear, Nose, and Throat:  Antihistamines Passed - 11/14/2021  6:31 AM      Passed - Valid encounter within last 12 months    Recent Outpatient Visits          4 months ago Diabetes mellitus type 2 in obese [facility])   [facility] [place], Drue Stager, MD   7 months ago Diabetes mellitus type 2 in obese [facility])   [facility] [name], MD   10 months ago [facility] [name], MD   11 months ago [facility] [name], MD   1 year ago Diabetes mellitus type 2 in obese [facility])   [facility] [name], MD

## 2021-11-26 ENCOUNTER — Telehealth: Payer: Self-pay | Admitting: Family Medicine

## 2021-11-26 DIAGNOSIS — G8929 Other chronic pain: Secondary | ICD-10-CM

## 2021-11-26 DIAGNOSIS — M5442 Lumbago with sciatica, left side: Secondary | ICD-10-CM

## 2021-11-26 NOTE — Telephone Encounter (Signed)
Medication Refill - Medication:HYDROcodone-acetaminophen (NORCO) 7.5-325 MG table  has the patient contacted their pharmacy? yes (Agent: If no, request that the patient contact the pharmacy for the refill. If patient does not wish to contact the pharmacy document the reason why and proceed with request.) (Agent: If yes, when and what did the pharmacy advise?)contact pcp  Preferred Pharmacy (with phone number or street name):  [facility] [place], [address] AT [address] Phone:  [phone]  Fax:  [phone]     Has the patient been seen for an appointment in the last year OR does the patient have an upcoming appointment? yes

## 2021-11-26 NOTE — Telephone Encounter (Signed)
Requested medications are due for refill today.  yes  Requested medications are on the active medications list.  yes  Last refill. 07/06/2021  Future visit scheduled.   yes  Notes to clinic.  Medication not delegated. Pt has 3 rx's for this same medication.

## 2021-11-28 NOTE — Telephone Encounter (Signed)
Left voicemail relaying Dr.Sowles' message.

## 2021-11-28 NOTE — Telephone Encounter (Signed)
Wife states she was under the impression a 30 day hydrocodone would be called in prior to his virtual appt 12/03/21.  Pt has been out of medication for over a week and is having pain due to being out of this med.[facility] [phone] - [place], [address] AT [address]

## 2021-11-30 NOTE — Progress Notes (Signed)
Name: [name]   MRN: [id number]    DOB: 10-Feb-1971   Date:11/30/2021       Progress Note  Subjective  Chief Complaint  Medication Request  I connected with  [name]  on 11/30/21 at  3:40 PM EST by a video enabled telemedicine application and verified that I am speaking with the correct person using two identifiers.  I discussed the limitations of evaluation and management by telemedicine and the availability of in person appointments. The patient expressed understanding and agreed to proceed with the virtual visit  Staff also discussed with the patient that there may be a patient responsible charge related to this service. Patient Location: at home  Provider Location: [facility] Additional Individuals present: wife   HPI  Anxiety/Depression :doing better since last visit, marriage is back to normal. He is working and they are trying to get guardianship of their twin grand-babies.    DMII: it was diet controlled after bariatric surgery 2013, however levels gradually went up again and we resumed Metformin followed by [facility]belsus, last A1C was 6.6 % and advised him to come in this week for repeat level. He denies polyphagia, polydipsia or polyuria.  Eye exam is up to date. BP is now at goal on Diovan 320 mg daily . He is on Crestor daily and tolerating it well    Muscle Cramps: he was doing well, he is now on crestor and seems to tolerate it better    Hypothyroidism: taking same dose of Synthroid for many months, last TSH done 03/28/2021 was 0.46  He has chronic dry skin, no hair loss , no change in bowel movement or palpitation  Recheck level yearly    Chronic low back pain : he is taking Norco 7.5/325mg mostly twice daily and is getting 70 pills, he is also taking Lyrica and it seems to help with pain and denies side effects No longer under disability , working full time at [employer]. Pain level today 7/10, he has been out of medication for a couple of weeks. He would like to resume it  today. Offered to switch to Duragesic patch but he states it did not work in the past.    Neuropathy: he has bilateral daily pain on both legs, secondary to chronic back problems, aching like, he denies burning or prickly sensation, Lyrica helps.Pain level but has been out of pain medications    History of bariatric surgery/Morbid obesity : surgery 09/2012 was he is morbidly obese his weight at the time was 396 lbs - lowest weight after surgery was 275 lbs, he gradually gained weight back, it stabilized at the 340's, over the past couple of visits is 333 lbs but today it was a virtual visit.   He is drinking more water now. He is now at [name] and is physically active at work    HTN: he is taking Diovan 320 mg daily, no chest pain or palpitation    Controlled gout; taking allopurinol , no recent episodes    Patient Active Problem List   Diagnosis Date Noted   Chronic midline low back pain with sciatica 02/11/2017   Anemia, unspecified 06/12/2016   B12 deficiency 06/11/2016   Allergic conjunctivitis 06/11/2016   Hypocalcemia 06/11/2016   Muscle cramps 06/11/2016   Hypertension, benign 11/03/2015   Dyslipidemia 11/02/2015   Acquired nystagmus 08/01/2015   Chronic back pain 06/29/2015   Carpal tunnel syndrome 06/29/2015   Osteoarthritis 06/29/2015   Claustrophobia 06/29/2015   Foot  drop, left 06/29/2015   Hiatal hernia 06/29/2015   H/O diabetes mellitus 06/29/2015   H/O testicular cancer 06/29/2015   Acquired hypothyroidism 48/18/5631   Dysmetabolic syndrome 49/70/2637   Morbid obesity ([place]) 06/29/2015   Allergic rhinitis 06/29/2015   Neuropathy 06/29/2015   Seborrheic keratoses 06/29/2015   Vitamin D deficiency 06/29/2015   Bariatric surgery status 02/20/2012    Past Surgical History:  Procedure Laterality Date   BACK SURGERY     X 2   HERNIA REPAIR     left side and umbilical hernia repair   ROUX-EN-Y GASTRIC BYPASS  2.28.13   URETEROTOMY Left 2000    Family  History  Problem Relation Age of Onset   Coronary artery disease Mother    Cancer Father        Melanoma and Testicular   Diabetes Father     Social History   Socioeconomic History   Marital status: Married    Spouse name: Not on file   Number of children: 2   Years of education: Not on file   Highest education level: Some college, no degree  Occupational History   Occupation: grave digger     Comment: [place] [employer]   Tobacco Use   Smoking status: Former    Packs/day: 3.00    Years: 8.00    Pack years: 24.00    Types: Cigarettes, Cigars, Pipe    Start date: 06/28/1984    Quit date: 12/24/1991    Years since quitting: 29.9   Smokeless tobacco: Former    Types: Chew    Quit date: 12/24/1991  Substance and Sexual Activity   Alcohol use: No    Alcohol/week: 0.0 standard drinks   Drug use: No   Sexual activity: Yes    Partners: Female  Other Topics Concern   Not on file  Social History Narrative   Married, lives with wife, both children and grandchildren   Social Determinants of Health   Financial Resource Strain: Low Risk    Difficulty of Paying Living Expenses: Not very hard  Food Insecurity: No Food Insecurity   Worried About Charity fundraiser in the Last Year: Never true   Arboriculturist in the Last Year: Never true  Transportation Needs: No Transportation Needs   Lack of Transportation (Medical): No   Lack of Transportation (Non-Medical): No  Physical Activity: Not on file  Stress: Stress Concern Present   Feeling of Stress : Rather much  Social Connections: Moderately Isolated   Frequency of Communication with Friends and Family: Twice a week   Frequency of Social Gatherings with Friends and Family: Once a week   Attends Religious Services: Never   Marine scientist or Organizations: No   Attends Music therapist: Never   Marital Status: Married  Human resources officer Violence: Not on file     Current Outpatient Medications:     ALLERGY RELIEF 180 MG tablet, TAKE 1 TABLET(180 MG) BY MOUTH DAILY, Disp: 90 tablet, Rfl: 0   allopurinol (ZYLOPRIM) 100 MG tablet, TAKE 1 TABLET(100 MG) BY MOUTH DAILY, Disp: 90 tablet, Rfl: 1   blood glucose meter kit and supplies, Dispense based on patient and insurance preference. Use up to four times daily as directed. (FOR ICD-10 E10.9, E11.9)., Disp: 1 each, Rfl: 0   busPIRone (BUSPAR) 5 MG tablet, TAKE 1 TO 2 TABLETS(5 TO 10 MG) BY MOUTH THREE TIMES DAILY, Disp: 180 tablet, Rfl: 1   cyclobenzaprine (FLEXERIL) 10 MG tablet,  TAKE 1 TABLET(10 MG) BY MOUTH AT BEDTIME, Disp: 90 tablet, Rfl: 1   DULoxetine (CYMBALTA) 60 MG capsule, Take 1 capsule (60 mg total) by mouth daily., Disp: 90 capsule, Rfl: 1   HYDROcodone-acetaminophen (NORCO) 7.5-325 MG tablet, Take 1 tablet by mouth 3 (three) times daily as needed., Disp: 70 tablet, Rfl: 0   HYDROcodone-acetaminophen (NORCO) 7.5-325 MG tablet, Take 1 tablet by mouth 3 (three) times daily as needed for moderate pain., Disp: 70 tablet, Rfl: 0   HYDROcodone-acetaminophen (NORCO) 7.5-325 MG tablet, Take 1 tablet by mouth 3 (three) times daily as needed for moderate pain., Disp: 70 tablet, Rfl: 0   metFORMIN (GLUCOPHAGE-XR) 750 MG 24 hr tablet, Take 1 tablet (750 mg total) by mouth daily with breakfast., Disp: 90 tablet, Rfl: 1   pregabalin (LYRICA) 225 MG capsule, Take 1 capsule (225 mg total) by mouth 2 (two) times daily., Disp: 180 capsule, Rfl: 1   QUEtiapine (SEROQUEL) 25 MG tablet, Take 1 tablet (25 mg total) by mouth at bedtime., Disp: 90 tablet, Rfl: 1   rosuvastatin (CRESTOR) 5 MG tablet, Take 1 tablet (5 mg total) by mouth daily., Disp: 90 tablet, Rfl: 1   Semaglutide (RYBELSUS) 7 MG TABS, Take 7 mg by mouth daily., Disp: 90 tablet, Rfl: 1   SYNTHROID 137 MCG tablet, TAKE 1 TABLET BY MOUTH EVERY DAY AND 1 AND 1/2 ON SUNDAYS, Disp: 90 tablet, Rfl: 1   traZODone (DESYREL) 100 MG tablet, TAKE 1 AND 1/2 TABLETS(150 MG) BY MOUTH AT BEDTIME, Disp: 135  tablet, Rfl: 0   valsartan (DIOVAN) 320 MG tablet, Take 1 tablet (320 mg total) by mouth daily., Disp: 90 tablet, Rfl: 1  Allergies  Allergen Reactions   Morphine Itching    I personally reviewed active problem list, medication list, allergies, family history, social history, health maintenance with the patient/caregiver today.   ROS  Ten systems reviewed and is negative except as mentioned in HPI   Objective  Virtual encounter, vitals not obtained.  There is no height or weight on file to calculate BMI.  Physical Exam  Awake, alert and oriented   PHQ2/9: Depression screen [facility] 2/9 07/24/2021 07/06/2021 03/28/2021 01/09/2021 11/20/2020  Decreased Interest 0 1 0 0 0  Down, Depressed, Hopeless 0 0 0 0 0  PHQ - 2 Score 0 1 0 0 0  Altered sleeping 0 0 0 1 1  Tired, decreased energy 0 0 0 0 0  Change in appetite 0 0 0 0 0  Feeling bad or failure about yourself  0 0 0 0 0  Trouble concentrating 0 0 0 0 0  Moving slowly or fidgety/restless 0 0 0 0 0  Suicidal thoughts 0 0 0 0 0  PHQ-9 Score 0 1 0 1 1  Difficult doing work/chores - - - - -  Some recent data might be hidden   PHQ-2/9 Result is negative.    Fall Risk: Fall Risk  07/06/2021 03/28/2021 01/09/2021 11/20/2020 10/02/2020  Falls in the past year? 1 0 0 1 0  Number falls in past yr: 1 0 0 1 0  Injury with Fall? 1 0 0 0 0  Follow up - - - - -     Assessment & Plan  1. Chronic midline low back pain with bilateral sciatica  - HYDROcodone-acetaminophen (NORCO) 7.5-325 MG tablet; Take 1 tablet by mouth 3 (three) times daily as needed for moderate pain.  Dispense: 70 tablet; Refill: 0 - HYDROcodone-acetaminophen (NORCO) 7.5-325 MG tablet; Take 1  tablet by mouth 3 (three) times daily as needed.  Dispense: 70 tablet; Refill: 0 - HYDROcodone-acetaminophen (NORCO) 7.5-325 MG tablet; Take 1 tablet by mouth 3 (three) times daily as needed for moderate pain.  Dispense: 70 tablet; Refill: 0  2. OSA (obstructive sleep  apnea)   3. Morbid obesity ([place])  Discussed with the patient the risk posed by an increased BMI. Discussed importance of portion control, calorie counting and at least 150 minutes of physical activity weekly. Avoid sweet beverages and drink more water. Eat at least 6 servings of fruit and vegetables daily    4. Acquired hypothyroidism  - SYNTHROID 137 MCG tablet; TAKE 1 TABLET BY MOUTH EVERY DAY AND 1 AND 1/2 ON SUNDAYS  Dispense: 90 tablet; Refill: 1  5. Bariatric surgery status   6. B12 deficiency  - CBC with Differential/Platelet  7. Vitamin D deficiency   8. Hypertension, benign  - valsartan (DIOVAN) 320 MG tablet; Take 1 tablet (320 mg total) by mouth daily.  Dispense: 90 tablet; Refill: 1  9. Hypertension associated with type 2 diabetes mellitus ([place])   10. Controlled gout  - allopurinol (ZYLOPRIM) 100 MG tablet; TAKE 1 TABLET(100 MG) BY MOUTH DAILY  Dispense: 90 tablet; Refill: 1  11. Dysthymia  - busPIRone (BUSPAR) 5 MG tablet; TAKE 1 TO 2 TABLETS(5 TO 10 MG) BY MOUTH THREE TIMES DAILY  Dispense: 180 tablet; Refill: 1 - QUEtiapine (SEROQUEL) 25 MG tablet; Take 1 tablet (25 mg total) by mouth at bedtime.  Dispense: 90 tablet; Refill: 1 - DULoxetine (CYMBALTA) 60 MG capsule; Take 1 capsule (60 mg total) by mouth daily.  Dispense: 90 capsule; Refill: 1  12. Anxiety  - busPIRone (BUSPAR) 5 MG tablet; TAKE 1 TO 2 TABLETS(5 TO 10 MG) BY MOUTH THREE TIMES DAILY  Dispense: 180 tablet; Refill: 1 - QUEtiapine (SEROQUEL) 25 MG tablet; Take 1 tablet (25 mg total) by mouth at bedtime.  Dispense: 90 tablet; Refill: 1  13. Other insomnia  - traZODone (DESYREL) 100 MG tablet; TAKE 1 AND 1/2 TABLETS(150 MG) BY MOUTH AT BEDTIME  Dispense: 135 tablet; Refill: 1 - QUEtiapine (SEROQUEL) 25 MG tablet; Take 1 tablet (25 mg total) by mouth at bedtime.  Dispense: 90 tablet; Refill: 1  14. Diabetes mellitus type 2 in obese ([facility])  - Semaglutide (RYBELSUS) 7 MG TABS; Take 7 mg by mouth  daily.  Dispense: 90 tablet; Refill: 1 - metFORMIN (GLUCOPHAGE-XR) 750 MG 24 hr tablet; Take 1 tablet (750 mg total) by mouth daily with breakfast.  Dispense: 90 tablet; Refill: 1 - Hemoglobin A1c  15. Chronic nonmalignant pain  - pregabalin (LYRICA) 225 MG capsule; Take 1 capsule (225 mg total) by mouth 2 (two) times daily.  Dispense: 180 capsule; Refill: 1 - HYDROcodone-acetaminophen (NORCO) 7.5-325 MG tablet; Take 1 tablet by mouth 3 (three) times daily as needed for moderate pain.  Dispense: 70 tablet; Refill: 0 - HYDROcodone-acetaminophen (NORCO) 7.5-325 MG tablet; Take 1 tablet by mouth 3 (three) times daily as needed.  Dispense: 70 tablet; Refill: 0 - HYDROcodone-acetaminophen (NORCO) 7.5-325 MG tablet; Take 1 tablet by mouth 3 (three) times daily as needed for moderate pain.  Dispense: 70 tablet; Refill: 0  16. Chronic midline low back pain with sciatica, sciatica laterality unspecified  - pregabalin (LYRICA) 225 MG capsule; Take 1 capsule (225 mg total) by mouth 2 (two) times daily.  Dispense: 180 capsule; Refill: 1  17. Muscle cramps  - cyclobenzaprine (FLEXERIL) 10 MG tablet; TAKE 1 TABLET(10 MG) BY MOUTH  AT BEDTIME  Dispense: 90 tablet; Refill: 1  18. Dyslipidemia  - rosuvastatin (CRESTOR) 5 MG tablet; Take 1 tablet (5 mg total) by mouth daily.  Dispense: 90 tablet; Refill: 1  19. Microcytic anemia  - CBC with Differential/Platelet - Iron, TIBC and Ferritin Panel   I discussed the assessment and treatment plan with the patient. The patient was provided an opportunity to ask questions and all were answered. The patient agreed with the plan and demonstrated an understanding of the instructions.  The patient was advised to call back or seek an in-person evaluation if the symptoms worsen or if the condition fails to improve as anticipated.  I provided 25  minutes of non-face-to-face time during this encounter.

## 2021-12-03 ENCOUNTER — Telehealth (INDEPENDENT_AMBULATORY_CARE_PROVIDER_SITE_OTHER): Payer: Managed Care, Other (non HMO) | Admitting: Family Medicine

## 2021-12-03 ENCOUNTER — Encounter: Payer: Self-pay | Admitting: Family Medicine

## 2021-12-03 DIAGNOSIS — E039 Hypothyroidism, unspecified: Secondary | ICD-10-CM

## 2021-12-03 DIAGNOSIS — M5441 Lumbago with sciatica, right side: Secondary | ICD-10-CM

## 2021-12-03 DIAGNOSIS — G8929 Other chronic pain: Secondary | ICD-10-CM

## 2021-12-03 DIAGNOSIS — E1169 Type 2 diabetes mellitus with other specified complication: Secondary | ICD-10-CM

## 2021-12-03 DIAGNOSIS — F341 Dysthymic disorder: Secondary | ICD-10-CM

## 2021-12-03 DIAGNOSIS — M5440 Lumbago with sciatica, unspecified side: Secondary | ICD-10-CM

## 2021-12-03 DIAGNOSIS — I10 Essential (primary) hypertension: Secondary | ICD-10-CM

## 2021-12-03 DIAGNOSIS — E785 Hyperlipidemia, unspecified: Secondary | ICD-10-CM

## 2021-12-03 DIAGNOSIS — R252 Cramp and spasm: Secondary | ICD-10-CM

## 2021-12-03 DIAGNOSIS — D509 Iron deficiency anemia, unspecified: Secondary | ICD-10-CM

## 2021-12-03 DIAGNOSIS — G4733 Obstructive sleep apnea (adult) (pediatric): Secondary | ICD-10-CM | POA: Diagnosis not present

## 2021-12-03 DIAGNOSIS — M5442 Lumbago with sciatica, left side: Secondary | ICD-10-CM

## 2021-12-03 DIAGNOSIS — E1159 Type 2 diabetes mellitus with other circulatory complications: Secondary | ICD-10-CM

## 2021-12-03 DIAGNOSIS — E538 Deficiency of other specified B group vitamins: Secondary | ICD-10-CM

## 2021-12-03 DIAGNOSIS — Z9884 Bariatric surgery status: Secondary | ICD-10-CM

## 2021-12-03 DIAGNOSIS — G4709 Other insomnia: Secondary | ICD-10-CM

## 2021-12-03 DIAGNOSIS — E559 Vitamin D deficiency, unspecified: Secondary | ICD-10-CM

## 2021-12-03 DIAGNOSIS — I152 Hypertension secondary to endocrine disorders: Secondary | ICD-10-CM

## 2021-12-03 DIAGNOSIS — E669 Obesity, unspecified: Secondary | ICD-10-CM

## 2021-12-03 DIAGNOSIS — F419 Anxiety disorder, unspecified: Secondary | ICD-10-CM

## 2021-12-03 DIAGNOSIS — M109 Gout, unspecified: Secondary | ICD-10-CM

## 2021-12-03 MED ORDER — DULOXETINE HCL 60 MG PO CPEP
60.0000 mg | ORAL_CAPSULE | Freq: Every day | ORAL | 1 refills | Status: DC
Start: 2021-12-03 — End: 2022-06-03

## 2021-12-03 MED ORDER — RYBELSUS 7 MG PO TABS
7.0000 mg | ORAL_TABLET | Freq: Every day | ORAL | 1 refills | Status: DC
Start: 2021-12-03 — End: 2022-06-11

## 2021-12-03 MED ORDER — PREGABALIN 225 MG PO CAPS: 225.0000 mg | ORAL_CAPSULE | Freq: Two times a day (BID) | ORAL | 1 refills | Status: DC

## 2021-12-03 MED ORDER — QUETIAPINE FUMARATE 25 MG PO TABS
25.0000 mg | ORAL_TABLET | Freq: Every day | ORAL | 1 refills | Status: DC
Start: 2021-12-03 — End: 2022-06-11

## 2021-12-03 MED ORDER — SYNTHROID 137 MCG PO TABS
ORAL_TABLET | ORAL | 1 refills | Status: DC
Start: 2021-12-03 — End: 2022-12-09

## 2021-12-03 MED ORDER — CYCLOBENZAPRINE HCL 10 MG PO TABS: ORAL_TABLET | ORAL | 1 refills | Status: DC

## 2021-12-03 MED ORDER — VALSARTAN 320 MG PO TABS: 320.0000 mg | ORAL_TABLET | Freq: Every day | ORAL | 1 refills | Status: DC

## 2021-12-03 MED ORDER — ROSUVASTATIN CALCIUM 5 MG PO TABS
5.0000 mg | ORAL_TABLET | Freq: Every day | ORAL | 1 refills | Status: DC
Start: 2021-12-03 — End: 2022-06-03

## 2021-12-03 MED ORDER — ALLOPURINOL 100 MG PO TABS: ORAL_TABLET | ORAL | 1 refills | Status: DC

## 2021-12-03 MED ORDER — HYDROCODONE-ACETAMINOPHEN 7.5-325 MG PO TABS: 1.0000 | ORAL_TABLET | Freq: Three times a day (TID) | ORAL | 0 refills | Status: DC | PRN

## 2021-12-03 MED ORDER — TRAZODONE HCL 100 MG PO TABS: ORAL_TABLET | ORAL | 1 refills | Status: DC

## 2021-12-03 MED ORDER — METFORMIN HCL ER 750 MG PO TB24: 750.0000 mg | ORAL_TABLET | Freq: Every day | ORAL | 1 refills | Status: DC

## 2021-12-03 MED ORDER — BUSPIRONE HCL 5 MG PO TABS: ORAL_TABLET | ORAL | 1 refills | Status: DC

## 2021-12-06 ENCOUNTER — Telehealth: Payer: Self-pay

## 2021-12-06 NOTE — Telephone Encounter (Signed)
PA and appeal are pending, we will provide an update once we have received a determination.   Copied from [place] [id number]. Topic: General - Other >> Dec 05, 2021  4:30 PM Tessa Lerner A wrote: Reason for CRM: The patient's wife has called for an update on paperwork submitted by the patient's pharmacy related to the denial of their HYDROcodone-acetaminophen (NORCO) 7.5-325 MG tablet [[id number]]   The paperwork was resubmitted to the practice 12/04/21 possibly electronically   Please contact the patient's wife further when possible

## 2022-01-24 ENCOUNTER — Other Ambulatory Visit: Payer: Self-pay | Admitting: Family Medicine

## 2022-01-24 DIAGNOSIS — M5440 Lumbago with sciatica, unspecified side: Secondary | ICD-10-CM

## 2022-01-24 DIAGNOSIS — G8929 Other chronic pain: Secondary | ICD-10-CM

## 2022-01-24 NOTE — Telephone Encounter (Signed)
Medication Refill - Medication:  pregabalin (LYRICA) 225 MG capsule   Has the patient contacted their pharmacy? Yes.     Preferred Pharmacy (with phone number or street name):  [facility] [place] - [address] Phone:  [phone]  Fax:  [phone]      Has the patient been seen for an appointment in the last year OR does the patient have an upcoming appointment? Yes.    Agent: Please be advised that RX refills may take up to 3 business days. We ask that you follow-up with your pharmacy.

## 2022-01-24 NOTE — Telephone Encounter (Signed)
Requested medication (s) are due for refill today:   Provider to review  Requested medication (s) are on the active medication list:   Yes  Future visit scheduled:   Yes   Last ordered: 12/03/2021 #180, 1 refill  Returned because it's a non delegated refill   Requested Prescriptions  Pending Prescriptions Disp Refills   pregabalin (LYRICA) 225 MG capsule 180 capsule 1    Sig: Take 1 capsule (225 mg total) by mouth 2 (two) times daily.     Not Delegated - Neurology:  Anticonvulsants - Controlled - pregabalin Failed - 01/24/2022  9:38 AM      Failed - This refill cannot be delegated      Passed - Cr in normal range and within 360 days    Creat  Date Value Ref Range Status  03/28/2021 1.29 0.60 - 1.35 mg/dL Final   Creatinine, Urine  Date Value Ref Range Status  03/28/2021 128 20 - 320 mg/dL Final          Passed - Completed PHQ-2 or PHQ-9 in the last 360 days      Passed - Valid encounter within last 12 months    Recent Outpatient Visits           1 month ago Chronic midline low back pain with bilateral sciatica   [facility] [name], MD   6 months ago Diabetes mellitus type 2 in obese [facility])   [facility] [place], Drue Stager, MD   10 months ago Diabetes mellitus type 2 in obese [facility])   [facility] [name], MD   1 year ago [facility] [name], MD   1 year ago [facility] [name], MD       Future Appointments             In 1 month Ancil Boozer, Drue Stager, MD [facility], [facility]

## 2022-01-25 MED ORDER — PREGABALIN 225 MG PO CAPS: 225.0000 mg | ORAL_CAPSULE | Freq: Two times a day (BID) | ORAL | 1 refills | Status: DC

## 2022-02-15 ENCOUNTER — Other Ambulatory Visit: Payer: Self-pay | Admitting: Family Medicine

## 2022-02-15 DIAGNOSIS — I10 Essential (primary) hypertension: Secondary | ICD-10-CM

## 2022-02-18 ENCOUNTER — Other Ambulatory Visit: Payer: Self-pay | Admitting: Family Medicine

## 2022-02-18 DIAGNOSIS — I10 Essential (primary) hypertension: Secondary | ICD-10-CM

## 2022-02-18 MED ORDER — VALSARTAN 320 MG PO TABS: 320.0000 mg | ORAL_TABLET | Freq: Every day | ORAL | 0 refills | Status: DC

## 2022-02-28 NOTE — Progress Notes (Signed)
Name: [name]   MRN: [id number]    DOB: 10-04-1971   Date:03/04/2022       Progress Note  Subjective  Chief Complaint  Follow Up  HPI  Anxiety/Depression :doing better since last visit, he states as long as both of them ( wife and himself) are compliant with medications they get along well. He is no longer working, back on disability,  they are trying to get guardianship of their twin grand-babies.    DMII: it was diet controlled after bariatric surgery 2013, however levels gradually went up again and we resumed Metformin followed by Rybelsus, last A1C was 6.6 %  He denies polyphagia, polydipsia or polyuria.  Eye exam is up to date. BP is now at goal on Diovan 320 mg daily . He is on Crestor daily and tolerating it well , LDL is at goal    Muscle Cramps: he was doing well, he is now on crestor and seems to tolerate it better.    Hypothyroidism: taking same dose of Synthroid for many months, last TSH done 03/28/2021 was 0.46  He has chronic dry skin, no hair loss , no change in bowel movement or palpitation     Chronic low back pain : he is taking Norco 7.5/325mg mostly twice daily and is getting 70 pills, he is also taking Lyrica and it seems to help with pain and denies side effects No longer under disability , working full time at [employer]. Pain level today 7/10, he has been out of medication for a couple of weeks. He would like to resume it today. Offered to switch to Duragesic patch but he states it did not work in the past.    Neuropathy: he has bilateral daily pain on both legs, secondary to chronic back problems, aching like, he denies burning or prickly sensation, Lyrica helps. Pain level is up on left foot and is back on disability. He also has some swelling on left foot since he dropped granite on his foot a few years ago    History of bariatric surgery/Morbid obesity : surgery 09/2012 was he is morbidly obese his weight at the time was 396 lbs - lowest weight after surgery was  275 lbs, he gradually gained weight back, it stabilized at the 340's, over the past couple of visits is 333 lbs but today it was a virtual visit.   He is drinking more water now. He is now at [employer] and is physically active at work    HTN: he is taking Diovan 320 mg daily, no chest pain, dizziness  or palpitation    Controlled gout; taking allopurinol , no recent episodes    Patient Active Problem List   Diagnosis Date Noted   Chronic midline low back pain with sciatica 02/11/2017   Anemia, unspecified 06/12/2016   B12 deficiency 06/11/2016   Allergic conjunctivitis 06/11/2016   Hypocalcemia 06/11/2016   Muscle cramps 06/11/2016   Hypertension, benign 11/03/2015   Dyslipidemia 11/02/2015   Acquired nystagmus 08/01/2015   Chronic back pain 06/29/2015   Carpal tunnel syndrome 06/29/2015   Osteoarthritis 06/29/2015   Claustrophobia 06/29/2015   Foot drop, left 06/29/2015   Hiatal hernia 06/29/2015   H/O diabetes mellitus 06/29/2015   H/O testicular cancer 06/29/2015   Acquired hypothyroidism 26/83/4196   Dysmetabolic syndrome 22/29/7989   Morbid obesity ([place]) 06/29/2015   Allergic rhinitis 06/29/2015   Neuropathy 06/29/2015   Seborrheic keratoses 06/29/2015   Vitamin D deficiency 06/29/2015   Bariatric surgery status  02/20/2012    Past Surgical History:  Procedure Laterality Date   BACK SURGERY     X 2   HERNIA REPAIR     left side and umbilical hernia repair   ROUX-EN-Y GASTRIC BYPASS  2.28.13   URETEROTOMY Left 2000    Family History  Problem Relation Age of Onset   Coronary artery disease Mother    Cancer Father        Melanoma and Testicular   Diabetes Father     Social History   Tobacco Use   Smoking status: Former    Packs/day: 3.00    Years: 8.00    Pack years: 24.00    Types: Cigarettes, Cigars, Pipe    Start date: 06/28/1984    Quit date: 12/24/1991    Years since quitting: 30.2   Smokeless tobacco: Former    Types: Chew    Quit date: 12/24/1991   Substance Use Topics   Alcohol use: No    Alcohol/week: 0.0 standard drinks     Current Outpatient Medications:    ALLERGY RELIEF 180 MG tablet, TAKE 1 TABLET(180 MG) BY MOUTH DAILY, Disp: 90 tablet, Rfl: 0   allopurinol (ZYLOPRIM) 100 MG tablet, TAKE 1 TABLET(100 MG) BY MOUTH DAILY, Disp: 90 tablet, Rfl: 1   blood glucose meter kit and supplies, Dispense based on patient and insurance preference. Use up to four times daily as directed. (FOR ICD-10 E10.9, E11.9)., Disp: 1 each, Rfl: 0   busPIRone (BUSPAR) 5 MG tablet, TAKE 1 TO 2 TABLETS(5 TO 10 MG) BY MOUTH THREE TIMES DAILY, Disp: 180 tablet, Rfl: 1   cyclobenzaprine (FLEXERIL) 10 MG tablet, TAKE 1 TABLET(10 MG) BY MOUTH AT BEDTIME, Disp: 90 tablet, Rfl: 1   DULoxetine (CYMBALTA) 60 MG capsule, Take 1 capsule (60 mg total) by mouth daily., Disp: 90 capsule, Rfl: 1   HYDROcodone-acetaminophen (NORCO) 7.5-325 MG tablet, Take 1 tablet by mouth 3 (three) times daily as needed for moderate pain., Disp: 70 tablet, Rfl: 0   HYDROcodone-acetaminophen (NORCO) 7.5-325 MG tablet, Take 1 tablet by mouth 3 (three) times daily as needed., Disp: 70 tablet, Rfl: 0   HYDROcodone-acetaminophen (NORCO) 7.5-325 MG tablet, Take 1 tablet by mouth 3 (three) times daily as needed for moderate pain., Disp: 70 tablet, Rfl: 0   metFORMIN (GLUCOPHAGE-XR) 750 MG 24 hr tablet, Take 1 tablet (750 mg total) by mouth daily with breakfast., Disp: 90 tablet, Rfl: 1   pregabalin (LYRICA) 225 MG capsule, Take 1 capsule (225 mg total) by mouth 2 (two) times daily., Disp: 180 capsule, Rfl: 1   QUEtiapine (SEROQUEL) 25 MG tablet, Take 1 tablet (25 mg total) by mouth at bedtime., Disp: 90 tablet, Rfl: 1   rosuvastatin (CRESTOR) 5 MG tablet, Take 1 tablet (5 mg total) by mouth daily., Disp: 90 tablet, Rfl: 1   Semaglutide (RYBELSUS) 7 MG TABS, Take 7 mg by mouth daily., Disp: 90 tablet, Rfl: 1   SYNTHROID 137 MCG tablet, TAKE 1 TABLET BY MOUTH EVERY DAY AND 1 AND 1/2 ON SUNDAYS,  Disp: 90 tablet, Rfl: 1   traZODone (DESYREL) 100 MG tablet, TAKE 1 AND 1/2 TABLETS(150 MG) BY MOUTH AT BEDTIME, Disp: 135 tablet, Rfl: 1   valsartan (DIOVAN) 320 MG tablet, Take 1 tablet (320 mg total) by mouth daily., Disp: 30 tablet, Rfl: 0  Allergies  Allergen Reactions   Morphine Itching    I personally reviewed active problem list, medication list, allergies, family history, social history, health maintenance with the patient/caregiver  today.   ROS  Constitutional: Negative for fever , positive for  weight change.  Respiratory: Negative for cough and shortness of breath.   Cardiovascular: Negative for chest pain or palpitations.  Gastrointestinal: Negative for abdominal pain, no bowel changes.  Musculoskeletal:positive for gait problem and intermittent joint swelling.  Skin: Negative for rash.  Neurological: Negative for dizziness or headache.  No other specific complaints in a complete review of systems (except as listed in HPI above).   Objective  Vitals:   03/04/22 1424  BP: 138/84  Pulse: 89  Resp: 16  SpO2: 98%  Weight: (!) 323 lb (146.5 kg)  Height: _0  (1.753 m)    Body mass index is 47.7 kg/m.  Physical Exam  Constitutional: Patient appears well-developed and well-nourished. Obese  No distress.  HEENT: head atraumatic, normocephalic, pupils equal and reactive to light, neck supple Cardiovascular: Normal rate, regular rhythm and normal heart sounds.  No murmur heard. No BLE edema. Pulmonary/Chest: Effort normal and breath sounds normal. No respiratory distress. Abdominal: Soft.  There is no tenderness. Psychiatric: Patient has a normal mood and affect. behavior is normal. Judgment and thought content normal.  Muscular Skeletal: pain during palpation of lumbar spine . Negative straight leg raise, left foot is swollen on the dorsal aspect, toes are pink and raised ( not touching the floor) when compared to right side ).   PHQ2/9: Depression screen [facility]  2/9 03/04/2022 12/03/2021 07/24/2021 07/06/2021 03/28/2021  Decreased Interest 0 0 0 1 0  Down, Depressed, Hopeless 0 0 0 0 0  PHQ - 2 Score 0 0 0 1 0  Altered sleeping 0 0 0 0 0  Tired, decreased energy 0 0 0 0 0  Change in appetite 0 0 0 0 0  Feeling bad or failure about yourself  0 0 0 0 0  Trouble concentrating 0 0 0 0 0  Moving slowly or fidgety/restless 0 0 0 0 0  Suicidal thoughts 0 0 0 0 0  PHQ-9 Score 0 0 0 1 0  Difficult doing work/chores - Not difficult at all - - -  Some recent data might be hidden    phq 9 is negative   Fall Risk: Fall Risk  03/04/2022 12/03/2021 07/06/2021 03/28/2021 01/09/2021  Falls in the past year? 0 1 1 0 0  Number falls in past yr: 0 0 1 0 0  Injury with Fall? 0 0 1 0 0  Risk for fall due to : No Fall Risks Impaired balance/gait - - -  Follow up Falls prevention discussed Falls prevention discussed - - -      Functional Status Survey: Is the patient deaf or have difficulty hearing?: No Does the patient have difficulty seeing, even when wearing glasses/contacts?: No Does the patient have difficulty concentrating, remembering, or making decisions?: No Does the patient have difficulty walking or climbing stairs?: No Does the patient have difficulty dressing or bathing?: No Does the patient have difficulty doing errands alone such as visiting a doctor's office or shopping?: No    Assessment & Plan  1. Diabetes mellitus type 2 in obese (HCC)  - Hemoglobin A1c  2. Hypertension associated with type 2 diabetes mellitus ([place])   3. Morbid obesity ([place])  Discussed with the patient the risk posed by an increased BMI. Discussed importance of portion control, calorie counting and at least 150 minutes of physical activity weekly. Avoid sweet beverages and drink more water. Eat at least 6 servings of fruit and vegetables  daily    4. Chronic midline low back pain with bilateral sciatica  - HYDROcodone-acetaminophen (NORCO) 7.5-325 MG tablet; Take 1  tablet by mouth 3 (three) times daily as needed for moderate pain.  Dispense: 70 tablet; Refill: 0 - HYDROcodone-acetaminophen (NORCO) 7.5-325 MG tablet; Take 1 tablet by mouth 3 (three) times daily as needed.  Dispense: 70 tablet; Refill: 0 - HYDROcodone-acetaminophen (NORCO) 7.5-325 MG tablet; Take 1 tablet by mouth 3 (three) times daily as needed for moderate pain.  Dispense: 70 tablet; Refill: 0  5. Hypertension, benign  - valsartan (DIOVAN) 320 MG tablet; Take 1 tablet (320 mg total) by mouth daily.  Dispense: 90 tablet; Refill: 1  6. OSA (obstructive sleep apnea)   7. Bariatric surgery status   8. Acquired hypothyroidism   9. Vitamin D deficiency   10. B12 deficiency   11. Controlled gout   12. Other insomnia   13. Primary osteoarthritis of left knee   14. Dyslipidemia   15. Chronic nonmalignant pain  Back pain but also neuropathy and swelling of left foot due to trauma   - HYDROcodone-acetaminophen (NORCO) 7.5-325 MG tablet; Take 1 tablet by mouth 3 (three) times daily as needed for moderate pain.  Dispense: 70 tablet; Refill: 0 - HYDROcodone-acetaminophen (NORCO) 7.5-325 MG tablet; Take 1 tablet by mouth 3 (three) times daily as needed.  Dispense: 70 tablet; Refill: 0 - HYDROcodone-acetaminophen (NORCO) 7.5-325 MG tablet; Take 1 tablet by mouth 3 (three) times daily as needed for moderate pain.  Dispense: 70 tablet; Refill: 0   16. History of foot fracture

## 2022-03-04 ENCOUNTER — Ambulatory Visit (INDEPENDENT_AMBULATORY_CARE_PROVIDER_SITE_OTHER): Payer: Managed Care, Other (non HMO) | Admitting: Family Medicine

## 2022-03-04 ENCOUNTER — Encounter: Payer: Self-pay | Admitting: Family Medicine

## 2022-03-04 VITALS — BP 138/84 | HR 89 | Resp 16 | Ht 69.0 in | Wt 323.0 lb

## 2022-03-04 DIAGNOSIS — E669 Obesity, unspecified: Secondary | ICD-10-CM

## 2022-03-04 DIAGNOSIS — G4709 Other insomnia: Secondary | ICD-10-CM

## 2022-03-04 DIAGNOSIS — M109 Gout, unspecified: Secondary | ICD-10-CM

## 2022-03-04 DIAGNOSIS — E1169 Type 2 diabetes mellitus with other specified complication: Secondary | ICD-10-CM

## 2022-03-04 DIAGNOSIS — I10 Essential (primary) hypertension: Secondary | ICD-10-CM

## 2022-03-04 DIAGNOSIS — Z8781 Personal history of (healed) traumatic fracture: Secondary | ICD-10-CM

## 2022-03-04 DIAGNOSIS — E1159 Type 2 diabetes mellitus with other circulatory complications: Secondary | ICD-10-CM

## 2022-03-04 DIAGNOSIS — I152 Hypertension secondary to endocrine disorders: Secondary | ICD-10-CM

## 2022-03-04 DIAGNOSIS — M5441 Lumbago with sciatica, right side: Secondary | ICD-10-CM

## 2022-03-04 DIAGNOSIS — G4733 Obstructive sleep apnea (adult) (pediatric): Secondary | ICD-10-CM

## 2022-03-04 DIAGNOSIS — E538 Deficiency of other specified B group vitamins: Secondary | ICD-10-CM

## 2022-03-04 DIAGNOSIS — E559 Vitamin D deficiency, unspecified: Secondary | ICD-10-CM

## 2022-03-04 DIAGNOSIS — E039 Hypothyroidism, unspecified: Secondary | ICD-10-CM

## 2022-03-04 DIAGNOSIS — E785 Hyperlipidemia, unspecified: Secondary | ICD-10-CM

## 2022-03-04 DIAGNOSIS — M1712 Unilateral primary osteoarthritis, left knee: Secondary | ICD-10-CM

## 2022-03-04 DIAGNOSIS — G8929 Other chronic pain: Secondary | ICD-10-CM

## 2022-03-04 DIAGNOSIS — Z9884 Bariatric surgery status: Secondary | ICD-10-CM

## 2022-03-04 DIAGNOSIS — M5442 Lumbago with sciatica, left side: Secondary | ICD-10-CM

## 2022-03-04 MED ORDER — HYDROCODONE-ACETAMINOPHEN 7.5-325 MG PO TABS: 1.0000 | ORAL_TABLET | Freq: Three times a day (TID) | ORAL | 0 refills | Status: DC | PRN

## 2022-03-04 MED ORDER — VALSARTAN 320 MG PO TABS: 320.0000 mg | ORAL_TABLET | Freq: Every day | ORAL | 1 refills | Status: DC

## 2022-03-05 LAB — HEMOGLOBIN A1C
Hgb A1c MFr Bld: 6.5 % of total Hgb — ABNORMAL HIGH (ref <5.7)
Mean Plasma Glucose: 140 mg/dL
eAG (mmol/L): 7.7 mmol/L

## 2022-03-28 ENCOUNTER — Other Ambulatory Visit: Payer: Self-pay | Admitting: Family Medicine

## 2022-03-28 DIAGNOSIS — E1169 Type 2 diabetes mellitus with other specified complication: Secondary | ICD-10-CM

## 2022-06-03 ENCOUNTER — Other Ambulatory Visit: Payer: Self-pay | Admitting: Family Medicine

## 2022-06-03 DIAGNOSIS — R252 Cramp and spasm: Secondary | ICD-10-CM

## 2022-06-03 DIAGNOSIS — F341 Dysthymic disorder: Secondary | ICD-10-CM

## 2022-06-03 DIAGNOSIS — E1169 Type 2 diabetes mellitus with other specified complication: Secondary | ICD-10-CM

## 2022-06-03 DIAGNOSIS — M109 Gout, unspecified: Secondary | ICD-10-CM

## 2022-06-03 DIAGNOSIS — G4709 Other insomnia: Secondary | ICD-10-CM

## 2022-06-03 DIAGNOSIS — E785 Hyperlipidemia, unspecified: Secondary | ICD-10-CM

## 2022-06-10 NOTE — Progress Notes (Unsigned)
Name: [name]   MRN: [id number]    DOB: Apr 22, 1971   Date:06/11/2022       Progress Note  Subjective  Chief Complaint  Follow Up  HPI  Anxiety/Depression :doing better since last visit, he states as long as both of them ( wife and himself) are compliant with medications they get along well. 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  Eye exam is up to date. BP is now at goal on Diovan 320 mg daily . He is on Crestor daily and tolerating it well , LDL is at goal    Muscle Cramps: he was doing well, he is now on crestor and seems to tolerate it better. Taking flexeril as needed.    Hypothyroidism: taking same dose of Synthroid for many months, last TSH done 03/28/2021 was 0.46  He has chronic dry skin, no hair loss , no change in bowel movement or palpitation  We will recheck labs today    Chronic low back pain : he is taking Norco 7.5/325mg mostly twice daily and is getting 70 pills, he is also taking Lyrica and it seems to help with pain and denies side effects No longer under disability , working full time at [employer]. Pain level today 8/10, he he needs refills today We will update drug contract and recheck urine test He states it affects his ability to walk long distance and would like a handicap tag    Neuropathy: he has bilateral daily pain on both legs, secondary to chronic back problems, aching like, he denies burning or prickly sensation, Lyrica helps. Pain level is up on left foot and is back on disability,. He also has some swelling on left foot since he dropped granite on his foot a few years ago - seen by  [employer] provider at the time and was diagnosed with nerve damage    History of bariatric surgery/Morbid obesity : surgery 09/2012 was he is morbidly obese his weight at the time was 396 lbs - lowest weight after surgery was 275 lbs, he gradually gained weight back, it stabilized at the 340's, it was in the mid 330's for a while, he is now on Rybelsus , started Dec 2022 and has lost weight, today down to 306 lbs    HTN: he is taking Diovan 320 mg daily, no chest pain, dizziness  or palpitation . BP is at goal    Controlled gout; taking allopurinol , no recent episodes.   Patient Active Problem List   Diagnosis Date Noted   Chronic midline low back pain with sciatica 02/11/2017   Anemia, unspecified 06/12/2016   B12 deficiency 06/11/2016   Allergic conjunctivitis 06/11/2016   Hypocalcemia 06/11/2016   Muscle cramps 06/11/2016   Hypertension, benign 11/03/2015   Dyslipidemia 11/02/2015   Acquired nystagmus 08/01/2015   Chronic back pain 06/29/2015   Carpal tunnel syndrome 06/29/2015   Osteoarthritis 06/29/2015   Claustrophobia 06/29/2015   Foot drop, left 06/29/2015   Hiatal hernia 06/29/2015   H/O diabetes mellitus 06/29/2015   H/O testicular cancer 06/29/2015   Acquired hypothyroidism 06/29/2015  Dysmetabolic syndrome 66/05/3015   Morbid obesity ([place]) 06/29/2015   Allergic rhinitis 06/29/2015   Neuropathy 06/29/2015   Seborrheic keratoses 06/29/2015   Vitamin D deficiency 06/29/2015   Bariatric surgery status 02/20/2012    Past Surgical History:  Procedure Laterality Date   BACK SURGERY     X 2   HERNIA REPAIR     left side and umbilical hernia repair   ROUX-EN-Y GASTRIC BYPASS  2.28.13   URETEROTOMY Left 2000    Family History  Problem Relation Age of Onset   Coronary artery disease Mother    Cancer Father        Melanoma and Testicular   Diabetes Father     Social History   Tobacco Use   Smoking status: Former    Packs/day: 3.00    Years: 8.00    Total  pack years: 24.00    Types: Cigarettes, Cigars, Pipe    Start date: 06/28/1984    Quit date: 12/24/1991    Years since quitting: 30.4   Smokeless tobacco: Former    Types: Chew    Quit date: 12/24/1991  Substance Use Topics   Alcohol use: No    Alcohol/week: 0.0 standard drinks of alcohol     Current Outpatient Medications:    ALLERGY RELIEF 180 MG tablet, TAKE 1 TABLET(180 MG) BY MOUTH DAILY, Disp: 90 tablet, Rfl: 0   allopurinol (ZYLOPRIM) 100 MG tablet, Take 1 tablet by mouth once daily, Disp: 90 tablet, Rfl: 0   blood glucose meter kit and supplies, Dispense based on patient and insurance preference. Use up to four times daily as directed. (FOR ICD-10 E10.9, E11.9)., Disp: 1 each, Rfl: 0   busPIRone (BUSPAR) 5 MG tablet, TAKE 1 TO 2 TABLETS(5 TO 10 MG) BY MOUTH THREE TIMES DAILY, Disp: 180 tablet, Rfl: 1   cyclobenzaprine (FLEXERIL) 10 MG tablet, TAKE 1 TABLET BY MOUTH AT BEDTIME, Disp: 90 tablet, Rfl: 0   DULoxetine (CYMBALTA) 60 MG capsule, Take 1 capsule by mouth once daily, Disp: 90 capsule, Rfl: 0   HYDROcodone-acetaminophen (NORCO) 7.5-325 MG tablet, Take 1 tablet by mouth 3 (three) times daily as needed for moderate pain., Disp: 70 tablet, Rfl: 0   HYDROcodone-acetaminophen (NORCO) 7.5-325 MG tablet, Take 1 tablet by mouth 3 (three) times daily as needed., Disp: 70 tablet, Rfl: 0   HYDROcodone-acetaminophen (NORCO) 7.5-325 MG tablet, Take 1 tablet by mouth 3 (three) times daily as needed for moderate pain., Disp: 70 tablet, Rfl: 0   Magnesium Oxide 500 MG TABS, Take 1 tablet by mouth once daily, Disp: 90 tablet, Rfl: 0   metFORMIN (GLUCOPHAGE-XR) 750 MG 24 hr tablet, Take 1 tablet by mouth once daily with breakfast, Disp: 90 tablet, Rfl: 0   pregabalin (LYRICA) 225 MG capsule, Take 1 capsule (225 mg total) by mouth 2 (two) times daily., Disp: 180 capsule, Rfl: 1   QUEtiapine (SEROQUEL) 25 MG tablet, Take 1 tablet (25 mg total) by mouth at bedtime., Disp: 90 tablet, Rfl: 1    rosuvastatin (CRESTOR) 5 MG tablet, Take 1 tablet by mouth once daily, Disp: 90 tablet, Rfl: 0   Semaglutide (RYBELSUS) 7 MG TABS, Take 7 mg by mouth daily., Disp: 90 tablet, Rfl: 1   SYNTHROID 137 MCG tablet, TAKE 1 TABLET BY MOUTH EVERY DAY AND 1 AND 1/2 ON SUNDAYS, Disp: 90 tablet, Rfl: 1   traZODone (DESYREL) 100 MG tablet, TAKE 1 & 1/2 (ONE & ONE-HALF) TABLETS BY MOUTH AT BEDTIME, Disp: 135 tablet, Rfl: 0  valsartan (DIOVAN) 320 MG tablet, Take 1 tablet (320 mg total) by mouth daily., Disp: 90 tablet, Rfl: 1  Allergies  Allergen Reactions   Morphine Itching    I personally reviewed active problem list, medication list, allergies, family history, social history, health maintenance with the patient/caregiver today.   ROS  Constitutional: Negative for fever or weight change.  Respiratory: Negative for cough and shortness of breath.   Cardiovascular: Negative for chest pain or palpitations.  Gastrointestinal: Negative for abdominal pain, no bowel changes.  Musculoskeletal: Negative for gait problem or joint swelling.  Skin: Negative for rash.  Neurological: Negative for dizziness or headache.  No other specific complaints in a complete review of systems (except as listed in HPI above).   Objective  Vitals:   06/11/22 1451  BP: 124/76  Pulse: 79  Resp: 16  SpO2: 96%  Weight: (!) 306 lb (138.8 kg)  Height: 5' 9"  (1.753 m)    Body mass index is 45.19 kg/m.  Physical Exam  Constitutional: Patient appears well-developed and well-nourished. Obese  No distress.  HEENT: head atraumatic, normocephalic, pupils equal and reactive to light, neck supple Cardiovascular: Normal rate, regular rhythm and normal heart sounds.  No murmur heard. No BLE edema. Pulmonary/Chest: Effort normal and breath sounds normal. No respiratory distress. Abdominal: Soft.  There is no tenderness. Psychiatric: Patient has a normal mood and affect. behavior is normal. Judgment and thought content normal.    PHQ2/9:    06/11/2022    2:51 PM 03/04/2022    2:24 PM 12/03/2021    3:04 PM 07/24/2021   11:24 AM 07/06/2021    9:48 AM  Depression screen PHQ 2/9  Decreased Interest 0 0 0 0 1  Down, Depressed, Hopeless 0 0 0 0 0  PHQ - 2 Score 0 0 0 0 1  Altered sleeping 3 0 0 0 0  Tired, decreased energy 0 0 0 0 0  Change in appetite 0 0 0 0 0  Feeling bad or failure about yourself  0 0 0 0 0  Trouble concentrating 0 0 0 0 0  Moving slowly or fidgety/restless 0 0 0 0 0  Suicidal thoughts 0 0 0 0 0  PHQ-9 Score 3 0 0 0 1  Difficult doing work/chores   Not difficult at all      phq 9 is negative   Fall Risk:    06/11/2022    2:51 PM 03/04/2022    2:24 PM 12/03/2021    3:03 PM 07/06/2021    9:48 AM 03/28/2021    8:12 AM  Fall Risk   Falls in the past year? 1 0 1 1 0  Number falls in past yr: 0 0 0 1 0  Injury with Fall? 0 0 0 1 0  Risk for fall due to : No Fall Risks No Fall Risks Impaired balance/gait    Follow up Falls prevention discussed Falls prevention discussed Falls prevention discussed        Functional Status Survey: Is the patient deaf or have difficulty hearing?: No Does the patient have difficulty seeing, even when wearing glasses/contacts?: Yes Does the patient have difficulty concentrating, remembering, or making decisions?: No Does the patient have difficulty walking or climbing stairs?: Yes Does the patient have difficulty dressing or bathing?: No Does the patient have difficulty doing errands alone such as visiting a doctor's office or shopping?: No    Assessment & Plan  1. Hypertension associated with type 2 diabetes mellitus ([place])  -  HM Diabetes Foot Exam - Lipid panel - Microalbumin / creatinine urine ratio - COMPLETE METABOLIC PANEL WITH GFR - Semaglutide (RYBELSUS) 14 MG TABS; Take 1 tablet (14 mg total) by mouth daily.  Dispense: 90 tablet; Refill: 1  2. Morbid obesity ([place])  Doing well , losing weight   3. Diabetes mellitus type 2 in obese  (HCC)  - POCT HgB A1C - metFORMIN (GLUCOPHAGE-XR) 750 MG 24 hr tablet; Take 1 tablet (750 mg total) by mouth daily with breakfast.  Dispense: 90 tablet; Refill: 1  4. Acquired hypothyroidism  - TSH  5. Dysthymia  - busPIRone (BUSPAR) 5 MG tablet; TAKE 1 TO 2 TABLETS(5 TO 10 MG) BY MOUTH THREE TIMES DAILY  Dispense: 180 tablet; Refill: 1 - DULoxetine (CYMBALTA) 60 MG capsule; Take 1 capsule (60 mg total) by mouth daily.  Dispense: 90 capsule; Refill: 1  6. Controlled gout  - allopurinol (ZYLOPRIM) 100 MG tablet; Take 1 tablet (100 mg total) by mouth 2 (two) times daily. Take 1 tablet by mouth once daily  Dispense: 90 tablet; Refill: 1  7. Muscle cramps  - cyclobenzaprine (FLEXERIL) 10 MG tablet; Take 1 tablet (10 mg total) by mouth at bedtime. TAKE 1 TABLET BY MOUTH AT BEDTIME  Dispense: 90 tablet; Refill: 1  8. Chronic midline low back pain with bilateral sciatica  - HYDROcodone-acetaminophen (NORCO) 7.5-325 MG tablet; Take 1 tablet by mouth 3 (three) times daily as needed for moderate pain.  Dispense: 70 tablet; Refill: 0 - HYDROcodone-acetaminophen (NORCO) 7.5-325 MG tablet; Take 1 tablet by mouth 3 (three) times daily as needed.  Dispense: 70 tablet; Refill: 0 - HYDROcodone-acetaminophen (NORCO) 7.5-325 MG tablet; Take 1 tablet by mouth 3 (three) times daily as needed for moderate pain.  Dispense: 70 tablet; Refill: 0 - DRUG MONITOR, BASE PANEL, SCREEN, URINE  9. Dyslipidemia  - rosuvastatin (CRESTOR) 5 MG tablet; Take 1 tablet (5 mg total) by mouth daily.  Dispense: 90 tablet; Refill: 1  10. B12 deficiency  - Vitamin B12  11. Chronic nonmalignant pain  - HYDROcodone-acetaminophen (NORCO) 7.5-325 MG tablet; Take 1 tablet by mouth 3 (three) times daily as needed for moderate pain.  Dispense: 70 tablet; Refill: 0 - HYDROcodone-acetaminophen (NORCO) 7.5-325 MG tablet; Take 1 tablet by mouth 3 (three) times daily as needed.  Dispense: 70 tablet; Refill: 0 -  HYDROcodone-acetaminophen (NORCO) 7.5-325 MG tablet; Take 1 tablet by mouth 3 (three) times daily as needed for moderate pain.  Dispense: 70 tablet; Refill: 0 - DRUG MONITOR, BASE PANEL, SCREEN, URINE  12. Other insomnia  - traZODone (DESYREL) 100 MG tablet; TAKE 1 & 1/2 (ONE & ONE-HALF) TABLETS BY MOUTH AT BEDTIME  Dispense: 135 tablet; Refill: 1  13. Hypertension, benign   14. Anemia, unspecified type  - CBC with Differential/Platelet - Iron, TIBC and Ferritin Panel  15. Vitamin D deficiency  - VITAMIN D 25 Hydroxy (Vit-D Deficiency, Fractures)  16. Anxiety  - busPIRone (BUSPAR) 5 MG tablet; TAKE 1 TO 2 TABLETS(5 TO 10 MG) BY MOUTH THREE TIMES DAILY  Dispense: 180 tablet; Refill: 1  17. Colon cancer screening  - Cologuard

## 2022-06-11 ENCOUNTER — Ambulatory Visit (INDEPENDENT_AMBULATORY_CARE_PROVIDER_SITE_OTHER): Payer: Self-pay | Admitting: Family Medicine

## 2022-06-11 ENCOUNTER — Encounter: Payer: Self-pay | Admitting: Family Medicine

## 2022-06-11 VITALS — BP 124/76 | HR 79 | Resp 16 | Ht 69.0 in | Wt 306.0 lb

## 2022-06-11 DIAGNOSIS — R252 Cramp and spasm: Secondary | ICD-10-CM

## 2022-06-11 DIAGNOSIS — Z1211 Encounter for screening for malignant neoplasm of colon: Secondary | ICD-10-CM

## 2022-06-11 DIAGNOSIS — G4709 Other insomnia: Secondary | ICD-10-CM

## 2022-06-11 DIAGNOSIS — F341 Dysthymic disorder: Secondary | ICD-10-CM

## 2022-06-11 DIAGNOSIS — D649 Anemia, unspecified: Secondary | ICD-10-CM

## 2022-06-11 DIAGNOSIS — E559 Vitamin D deficiency, unspecified: Secondary | ICD-10-CM

## 2022-06-11 DIAGNOSIS — E1159 Type 2 diabetes mellitus with other circulatory complications: Secondary | ICD-10-CM

## 2022-06-11 DIAGNOSIS — E669 Obesity, unspecified: Secondary | ICD-10-CM

## 2022-06-11 DIAGNOSIS — I10 Essential (primary) hypertension: Secondary | ICD-10-CM

## 2022-06-11 DIAGNOSIS — E1169 Type 2 diabetes mellitus with other specified complication: Secondary | ICD-10-CM

## 2022-06-11 DIAGNOSIS — F419 Anxiety disorder, unspecified: Secondary | ICD-10-CM

## 2022-06-11 DIAGNOSIS — I152 Hypertension secondary to endocrine disorders: Secondary | ICD-10-CM

## 2022-06-11 DIAGNOSIS — E039 Hypothyroidism, unspecified: Secondary | ICD-10-CM

## 2022-06-11 DIAGNOSIS — M5441 Lumbago with sciatica, right side: Secondary | ICD-10-CM

## 2022-06-11 DIAGNOSIS — E538 Deficiency of other specified B group vitamins: Secondary | ICD-10-CM

## 2022-06-11 DIAGNOSIS — G8929 Other chronic pain: Secondary | ICD-10-CM

## 2022-06-11 DIAGNOSIS — E785 Hyperlipidemia, unspecified: Secondary | ICD-10-CM

## 2022-06-11 DIAGNOSIS — M5442 Lumbago with sciatica, left side: Secondary | ICD-10-CM

## 2022-06-11 DIAGNOSIS — M109 Gout, unspecified: Secondary | ICD-10-CM

## 2022-06-11 LAB — POCT GLYCOSYLATED HEMOGLOBIN (HGB A1C): Hemoglobin A1C: 6.4 % — AB (ref 4.0–5.6)

## 2022-06-11 MED ORDER — DULOXETINE HCL 60 MG PO CPEP: 60.0000 mg | ORAL_CAPSULE | Freq: Every day | ORAL | 1 refills | Status: DC

## 2022-06-11 MED ORDER — RYBELSUS 14 MG PO TABS: 14.0000 mg | ORAL_TABLET | Freq: Every day | ORAL | 1 refills | Status: DC

## 2022-06-11 MED ORDER — HYDROCODONE-ACETAMINOPHEN 7.5-325 MG PO TABS: 1.0000 | ORAL_TABLET | Freq: Three times a day (TID) | ORAL | 0 refills | Status: DC | PRN

## 2022-06-11 MED ORDER — TRAZODONE HCL 100 MG PO TABS: ORAL_TABLET | ORAL | 1 refills | Status: DC

## 2022-06-11 MED ORDER — BUSPIRONE HCL 5 MG PO TABS: ORAL_TABLET | ORAL | 1 refills | Status: DC

## 2022-06-11 MED ORDER — CYCLOBENZAPRINE HCL 10 MG PO TABS: 10.0000 mg | ORAL_TABLET | Freq: Every day | ORAL | 1 refills | Status: DC

## 2022-06-11 MED ORDER — ROSUVASTATIN CALCIUM 5 MG PO TABS: 5.0000 mg | ORAL_TABLET | Freq: Every day | ORAL | 1 refills | Status: DC

## 2022-06-11 MED ORDER — ALLOPURINOL 100 MG PO TABS: 100.0000 mg | ORAL_TABLET | Freq: Two times a day (BID) | ORAL | 1 refills | Status: DC

## 2022-06-11 MED ORDER — METFORMIN HCL ER 750 MG PO TB24: 750.0000 mg | ORAL_TABLET | Freq: Every day | ORAL | 1 refills | Status: DC

## 2022-06-12 ENCOUNTER — Other Ambulatory Visit: Payer: Self-pay | Admitting: Family Medicine

## 2022-06-12 ENCOUNTER — Telehealth: Payer: Self-pay | Admitting: Family Medicine

## 2022-06-12 DIAGNOSIS — M109 Gout, unspecified: Secondary | ICD-10-CM

## 2022-06-12 MED ORDER — ALLOPURINOL 100 MG PO TABS: 100.0000 mg | ORAL_TABLET | Freq: Two times a day (BID) | ORAL | 1 refills | Status: DC

## 2022-06-12 NOTE — Telephone Encounter (Signed)
Copied from [place] [id number]. Topic: General - Other >> Jun 11, 2022  4:08 PM Sabas Sous wrote: Reason for CRM: the pharmacy called needing clarification for allopurinol, the directions are contradicting. Please advise

## 2022-07-11 LAB — COLOGUARD: COLOGUARD: NEGATIVE

## 2022-08-01 ENCOUNTER — Other Ambulatory Visit: Payer: Self-pay | Admitting: Family Medicine

## 2022-08-01 DIAGNOSIS — F419 Anxiety disorder, unspecified: Secondary | ICD-10-CM

## 2022-08-01 DIAGNOSIS — F341 Dysthymic disorder: Secondary | ICD-10-CM

## 2022-08-12 ENCOUNTER — Other Ambulatory Visit: Payer: Self-pay | Admitting: Family Medicine

## 2022-08-12 DIAGNOSIS — G8929 Other chronic pain: Secondary | ICD-10-CM

## 2022-08-14 ENCOUNTER — Ambulatory Visit: Payer: Self-pay

## 2022-08-14 NOTE — Progress Notes (Deleted)
   Acute Office Visit  Subjective:     Patient ID: [name], male    DOB: [birth year], 51 y.o.   MRN: [id number]  No chief complaint on file.   HPI Patient is in today for painful, swollen foot. Does have a history of gout, currently on Allopurinol 100 mg daily.   FOOT PAIN Duration: {Blank single:19197::"chronic","days","weeks","months"} Involved foot: {Blank single:19197::"left","right","bilateral"} Mechanism of injury: {Blank single:19197::"trauma","unknown"} Location:  Onset: {Blank single:19197::"sudden","gradual"}  Severity: {Blank single:19197::"mild","moderate","severe","1/10","2/10","3/10","4/10","5/10","6/10","7/10","8/10","9/10","10/10"}  Quality:  {Blank multiple:19196::"sharp","dull","aching","burning","cramping","ill-defined","itchy","pressure-like","pulling","shooting","sore","stabbing","tender","tearing","throbbing"} Frequency: {Blank single:19197::"constant","intermittent","occasional","rare","every few minutes","a few times a hour","a few times a day","a few times a week","a few times a month","a few times a year"} Radiation: {Blank single:19197::"yes","no"} Aggravating factors: {Blank multiple:19196::"weight bearing","walking","running","stairs","bending","movement","prolonged sitting"}  Alleviating factors: {Blank multiple:19196::"nothing","ice","physical therapy","HEP","APAP","NSAIDs","brace","crutches","rest"}  Status: {Blank multiple:19196::"better","worse","stable","fluctuating"} Treatments attempted: {Blank multiple:19196::"none","rest","ice","heat","APAP","ibuprofen","aleve","physical therapy","HEP"}  Relief with NSAIDs?:  {Blank single:19197::"No NSAIDs Taken","no","mild","moderate","significant"} Weakness with weight bearing or walking: {Blank single:19197::"yes","no"} Morning stiffness: {Blank single:19197::"yes","no"} Swelling: {Blank single:19197::"yes","no"} Redness: {Blank single:19197::"yes","no"} Bruising: {Blank  single:19197::"yes","no"} Paresthesias / decreased sensation: {Blank single:19197::"yes","no"}  Fevers:{Blank single:19197::"yes","no"}   ROS      Objective:    There were no vitals taken for this visit. {Vitals History (Optional):23777}  Physical Exam  No results found for any visits on 08/15/22.      Assessment & Plan:   Problem List Items Addressed This Visit   None   No orders of the defined types were placed in this encounter.   No follow-ups on file.  Teodora Medici, DO

## 2022-08-14 NOTE — Telephone Encounter (Signed)
Chief Complaint: Rt foot swelling pain. Knot on outside of foot half way between toes and heel Symptoms: Pain, swelling Frequency: 2 weeks Pertinent Negatives: Patient denies Fever, SOB, chest pain Disposition: '[x]'$ ED /'[]'$ Urgent Care (no appt availability in office) / '[]'$ Appointment(In office/virtual)/ '[]'$  [facility]/ '[]'$ Home Care/ '[]'$ Refused Recommended Disposition /'[]'$ [facility]/ '[]'$  Follow-up with PCP Additional Notes: Call from pt's wife. Wife states that pt came home from work about 2 weeks ago and said foot was hurting.  Foot is swollen and painful. More pain in the morning. Foot was also bruised. No known trama to foot.  Reason for Disposition  [1] After 3 days AND [2] pain not improved  [1] Thigh, calf, or ankle swelling AND [2] only 1 side  Answer Assessment - Initial Assessment Questions 1. MECHANISM: "How did the injury happen?" (e.g., twisting injury, direct blow)  Unsure. Came home from work and stated that foot was hurting all day     2. ONSET: "When did the injury happen?" (Minutes or hours ago)      2 weeks ago 3. LOCATION: "Where is the injury located?"      right 4. APPEARANCE of INJURY: "What does the injury look like?"      Swollen, was bruised - bruising has decreased. 5. WEIGHT-BEARING: "Can you put weight on that foot?" "Can you walk (four steps or more)?"       Is weight bearing  - limps 6. SIZE: For cuts, bruises, or swelling, ask: "How large is it?" (e.g., inches or centimeters;  entire joint)      Swelling -  7. PAIN: "Is there pain?" If Yes, ask: "How bad is the pain?"    (e.g., Scale 1-10; or mild, moderate, severe)   - NONE (0): no pain.   - MILD (1-3): doesn't interfere with normal activities.    - MODERATE (4-7): interferes with normal activities (e.g., work or school) or awakens from sleep, limping.    - SEVERE (8-10): excruciating pain, unable to do any normal activities, unable to walk.      Pain is worse in the morning. 7-8/10 8.  TETANUS: For any breaks in the skin, ask: "When was the last tetanus booster?"     no 9. OTHER SYMPTOMS: "Do you have any other symptoms?"      no 10. PREGNANCY: "Is there any chance you are pregnant?" "When was your last menstrual period?"       na  Answer Assessment - Initial Assessment Questions 1. ONSET: "When did the swelling start?" (e.g., minutes, hours, days)     2 weeks 2. LOCATION: "What part of the leg is swollen?"  "Are both legs swollen or just one leg?"     Right foot 3. SEVERITY: "How bad is the swelling?" (e.g., localized; mild, moderate, severe)   - Localized: Small area of swelling localized to one leg.   - MILD pedal edema: Swelling limited to foot and ankle, pitting edema < 1/4 inch (6 mm) deep, rest and elevation eliminate most or all swelling.   - MODERATE edema: Swelling of lower leg to knee, pitting edema > 1/4 inch (6 mm) deep, rest and elevation only partially reduce swelling.   - SEVERE edema: Swelling extends above knee, facial or hand swelling present.      mild 4. REDNESS: "Does the swelling look red or infected?"     no 5. PAIN: "Is the swelling painful to touch?" If Yes, ask: "How painful is it?"   (Scale  1-10; mild, moderate or severe)     8/10 6. FEVER: "Do you have a fever?" If Yes, ask: "What is it, how was it measured, and when did it start?"      no 7. CAUSE: "What do you think is causing the leg swelling?"     unknown 8. MEDICAL HISTORY: "Do you have a history of blood clots (e.g., DVT), cancer, heart failure, kidney disease, or liver failure?"     Cancer, HTN, lipids, diabetes 9. RECURRENT SYMPTOM: "Have you had leg swelling before?" If Yes, ask: "When was the last time?" "What happened that time?"     no 10. OTHER SYMPTOMS: "Do you have any other symptoms?" (e.g., chest pain, difficulty breathing)       no 11. PREGNANCY: "Is there any chance you are pregnant?" "When was your last menstrual period?"       na  Protocols used: Ankle and Foot  Injury-A-AH, Leg Swelling and Edema-A-AH

## 2022-08-15 ENCOUNTER — Ambulatory Visit: Payer: Self-pay | Admitting: Internal Medicine

## 2022-09-11 NOTE — Progress Notes (Signed)
Name: [name]   MRN: [id number]    DOB: 11-Oct-1971   Date:09/12/2022       Progress Note  Subjective  Chief Complaint  Follow Up  HPI  Anxiety/Depression :doing better since last visit, he states as long as both of them ( wife and himself) are compliant with medications they get along well. 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    DMII: it was diet controlled after bariatric surgery 2013, however levels gradually went up again and we resumed Metformin followed by Rybelsus, last A1C went from  6.6 %  to 6.4 % and today is 6.5 % He denies polyphagia, polydipsia or polyuria.  Eye exam is up to date. BP is now at goal on Diovan 320 mg daily . He is on Crestor daily and tolerating it well , LDL is at goal but due for repeat labs and urine micro    Muscle Cramps: he was doing well, he is now on crestor and seems to tolerate it better. Taking flexeril as needed, unchnaged.   Hypothyroidism: taking same dose of Synthroid for many months, last TSH done 03/28/2021 was 0.46  He has chronic dry skin, no hair loss , no change in bowel movement or palpitation  We will recheck labs today    Chronic low back pain : he was Norco 7.5/325mg twice daily for years however now has medicare and insurance no covering medication, he has been out for the past month. Pain is about 6/10 , stable on Lyrica and Flexeril, discussed trying otc Tylenol 500 mg max of 3 g per day to keep below toxic levels.    Neuropathy: he has bilateral daily pain on both legs, secondary to chronic back problems, aching like, he denies burning or prickly sensation, Lyrica helps. Pain level is up on left foot and is back on disability, Pain on foot also 6/10.  Right foot fracture: 5 th metatarsal seeing Dr. Vickki Muff, wearing a boot and supposed to  be non weight bearing.    History of bariatric surgery/Morbid obesity : surgery 09/2012 was he is morbidly obese his weight at the time was 396 lbs - lowest weight after surgery was 275 lbs, he gradually gained weight back, it stabilized at the 340's, it was in the mid 330's for a while, he is now on Rybelsus , started Dec 2022 and has lost weight, today down to 306 lbs    HTN: he is taking Diovan 320 mg daily, no chest pain, dizziness  or palpitation . BP is at goal . Continue medications    Controlled gout; taking allopurinol , no recent episodes.we will check level   Patient Active Problem List   Diagnosis Date Noted   Chronic midline low back pain with sciatica 02/11/2017   Anemia, unspecified 06/12/2016   B12 deficiency 06/11/2016   Allergic conjunctivitis 06/11/2016   Hypocalcemia 06/11/2016   Muscle cramps 06/11/2016   Hypertension, benign 11/03/2015   Dyslipidemia 11/02/2015   Acquired nystagmus 08/01/2015   Chronic back pain 06/29/2015   Carpal tunnel syndrome 06/29/2015   Osteoarthritis 06/29/2015   Claustrophobia 06/29/2015   Foot drop, left 06/29/2015   Hiatal hernia 06/29/2015   H/O diabetes mellitus 06/29/2015   H/O testicular cancer 06/29/2015   Acquired hypothyroidism 93/23/5573   Dysmetabolic syndrome  06/29/2015   Morbid obesity ([place]) 06/29/2015   Allergic rhinitis 06/29/2015   Neuropathy 06/29/2015   Seborrheic keratoses 06/29/2015   Vitamin D deficiency 06/29/2015   Bariatric surgery status 02/20/2012    Past Surgical History:  Procedure Laterality Date   BACK SURGERY     X 2   HERNIA REPAIR     left side and umbilical hernia repair   ROUX-EN-Y GASTRIC BYPASS  2.28.13   URETEROTOMY Left 2000    Family History  Problem Relation Age of Onset   Coronary artery disease Mother    Cancer Father        Melanoma and Testicular   Diabetes Father     Social History   Tobacco Use   Smoking status: Former    Packs/day: 3.00    Years: 8.00    Total  pack years: 24.00    Types: Cigarettes, Cigars, Pipe    Start date: 06/28/1984    Quit date: 12/24/1991    Years since quitting: 30.7   Smokeless tobacco: Former    Types: Chew    Quit date: 12/24/1991  Substance Use Topics   Alcohol use: No    Alcohol/week: 0.0 standard drinks of alcohol     Current Outpatient Medications:    ALLERGY RELIEF 180 MG tablet, TAKE 1 TABLET(180 MG) BY MOUTH DAILY, Disp: 90 tablet, Rfl: 0   allopurinol (ZYLOPRIM) 100 MG tablet, Take 1 tablet (100 mg total) by mouth 2 (two) times daily., Disp: 180 tablet, Rfl: 1   blood glucose meter kit and supplies, Dispense based on patient and insurance preference. Use up to four times daily as directed. (FOR ICD-10 E10.9, E11.9)., Disp: 1 each, Rfl: 0   busPIRone (BUSPAR) 5 MG tablet, TAKE 1 TO 2 TABLETS BY MOUTH THREE TIMES DAILY, Disp: 180 tablet, Rfl: 0   cyclobenzaprine (FLEXERIL) 10 MG tablet, Take 1 tablet (10 mg total) by mouth at bedtime. TAKE 1 TABLET BY MOUTH AT BEDTIME, Disp: 90 tablet, Rfl: 1   DULoxetine (CYMBALTA) 60 MG capsule, Take 1 capsule (60 mg total) by mouth daily., Disp: 90 capsule, Rfl: 1   HYDROcodone-acetaminophen (NORCO) 7.5-325 MG tablet, Take 1 tablet by mouth 3 (three) times daily as needed for moderate pain., Disp: 70 tablet, Rfl: 0   HYDROcodone-acetaminophen (NORCO) 7.5-325 MG tablet, Take 1 tablet by mouth 3 (three) times daily as needed., Disp: 70 tablet, Rfl: 0   HYDROcodone-acetaminophen (NORCO) 7.5-325 MG tablet, Take 1 tablet by mouth 3 (three) times daily as needed for moderate pain., Disp: 70 tablet, Rfl: 0   Magnesium Oxide 500 MG TABS, Take 1 tablet by mouth once daily, Disp: 90 tablet, Rfl: 0   metFORMIN (GLUCOPHAGE-XR) 750 MG 24 hr tablet, Take 1 tablet (750 mg total) by mouth daily with breakfast., Disp: 90 tablet, Rfl: 1   pregabalin (LYRICA) 225 MG capsule, Take 1 capsule by mouth twice daily, Disp: 60 capsule, Rfl: 0   QUEtiapine (SEROQUEL) 25 MG tablet, TAKE 1 TABLET BY MOUTH AT  BEDTIME, Disp: 30 tablet, Rfl: 0   rosuvastatin (CRESTOR) 5 MG tablet, Take 1 tablet (5 mg total) by mouth daily., Disp: 90 tablet, Rfl: 1   RYBELSUS 7 MG TABS, Take 1 tablet by mouth once daily, Disp: 30 tablet, Rfl: 0   Semaglutide (RYBELSUS) 14 MG TABS, Take 1 tablet (14 mg total) by mouth daily., Disp: 90 tablet, Rfl: 1   SYNTHROID 137 MCG tablet, TAKE 1 TABLET BY MOUTH EVERY DAY AND 1 AND 1/2 ON SUNDAYS,  Disp: 90 tablet, Rfl: 1   traZODone (DESYREL) 100 MG tablet, TAKE 1 & 1/2 (ONE & ONE-HALF) TABLETS BY MOUTH AT BEDTIME, Disp: 135 tablet, Rfl: 1   valsartan (DIOVAN) 320 MG tablet, Take 1 tablet (320 mg total) by mouth daily., Disp: 90 tablet, Rfl: 1  Allergies  Allergen Reactions   Morphine Itching    I personally reviewed active problem list, medication list, allergies, family history, social history, health maintenance with the patient/caregiver today.   ROS  Constitutional: Negative for fever or weight change.  Respiratory: Negative for cough and shortness of breath.   Cardiovascular: Negative for chest pain or palpitations.  Gastrointestinal: Negative for abdominal pain, no bowel changes.  Musculoskeletal:positive  for gait problem or joint swelling.  Skin: Negative for rash.  Neurological: Negative for dizziness or headache.  No other specific complaints in a complete review of systems (except as listed in HPI above).   Objective  Vitals:   09/12/22 0823  BP: 130/72  Pulse: 74  Resp: 16  SpO2: 98%  Weight: (!) 310 lb (140.6 kg)  Height: 5' 9"  (1.753 m)    Body mass index is 45.78 kg/m.  Physical Exam  Constitutional: Patient appears well-developed and well-nourished. Obes No distress.  HEENT: head atraumatic, normocephalic, pupils equal and reactive to light,, neck supple, throat within normal limits Cardiovascular: Normal rate, regular rhythm and normal heart sounds.  No murmur heard. No BLE edema. Pulmonary/Chest: Effort normal and breath sounds normal. No  respiratory distress. Abdominal: Soft.  There is no tenderness. Psychiatric: Patient has a normal mood and affect. behavior is normal. Judgment and thought content normal.  Muscular skeletal: wearing a boot on right foot due to recent fracture of 5 th metatarsal bone , pain with rom of lumbar spine, and to touch, negative straight leg raise   Recent Results (from the past 2160 hour(s))  Cologuard     Status: None   Collection Time: 07/02/22 11:50 AM  Result Value Ref Range   COLOGUARD Negative Negative    Comment:  NEGATIVE TEST RESULT. A negative Cologuard result indicates a low likelihood that a colorectal cancer (CRC) or advanced adenoma (adenomatous polyps with more advanced pre-malignant features)  is present. The chance that a person with a negative Cologuard test has a colorectal cancer is less than 1 in 1500 (negative predictive value >99.9%) or has an  advanced adenoma is less than  5.3% (negative predictive value 94.7%). These data are based on a prospective cross-sectional study of 10,000 individuals at average risk for colorectal cancer who were screened with both Cologuard and colonoscopy. (Imperiale T. et al, N Engl J Med 2014;370(14):1286-1297) The normal value (reference range) for this assay is negative.  COLOGUARD RE-SCREENING RECOMMENDATION: Periodic colorectal cancer screening is an important part of preventive healthcare for asymptomatic individuals at average risk for colorectal cancer.  Following a negative Cologuard result, the [place] Task Force screening guidelines recommend a Cologuard re-screening interval of 3 years.  References: American Cancer Society Guideline for Colorectal Cancer Screening: https://www.cancer.org/cancer/colon-rectal-cancer/detection-diagnosis-staging/acs-recommendations.html.; Rex DK, Boland CR, Dominitz JK, Colorectal Cancer Screening: Recommendations for Physicians and Patients from the [place] Task  Force on Colorectal Cancer Screening , Am J Gastroenterology 2017; 992:4268-3419.  TEST DESCRIPTION: Composite algorithmic analysis of stool DNA-biomarkers with hemoglobin immunoassay.   Quantitative values of individual biomarkers are not reportable and are not associated with individual biomarker result reference ranges. Cologuard is intended for colorectal cancer screening of adults of either sex,  72 years or older, who are at average-risk for colorectal cancer (CRC). Cologuard has been approved for use by the U.S. FDA. The performance of Cologuard was  established in a cross sectional study of average-risk adults aged 6-84. Cologuard performance in patients ages 48 to 67 years was estimated by sub-group analysis of near-age groups. Colonoscopies performed for a positive result may find as the most clinically significant lesion: colorectal cancer [4.0%], advanced adenoma (including sessile serrated polyps greater than or equal to 1cm diameter) [20%] or non- advanced adenoma [31%]; or no colorectal neoplasia [45%]. These estimates are derived from a prospective cross-sectional screening study of 10,000 individuals at average risk for colorectal cancer who were screened with both Cologuard and colonoscopy. (Imperiale T. et al, Alison Stalling J Med 2014;370(14):1286-1297.) Cologuard may produce a false negative or false positive result (no colorectal cancer or precancerous polyp present at colonoscopy follow up). A negative Cologuard test result does not guarantee the absence of CRC or advanced adenoma (pre-cancer). The current Cologuard  screening interval is every 3 years. Paramedic and [employer]). Cologuard performance data in a 10,000 patient pivotal study using colonoscopy as the reference method can be accessed at the following location: www.exactlabs.com/results. Additional description of the Cologuard test process, warnings and precautions can be found at www.cologuard.com.        PHQ2/9:    09/12/2022    8:22 AM 06/11/2022    2:51 PM 03/04/2022    2:24 PM 12/03/2021    3:04 PM 07/24/2021   11:24 AM  Depression screen PHQ 2/9  Decreased Interest 0 0 0 0 0  Down, Depressed, Hopeless 0 0 0 0 0  PHQ - 2 Score 0 0 0 0 0  Altered sleeping 0 3 0 0 0  Tired, decreased energy 0 0 0 0 0  Change in appetite 0 0 0 0 0  Feeling bad or failure about yourself  0 0 0 0 0  Trouble concentrating 0 0 0 0 0  Moving slowly or fidgety/restless 0 0 0 0 0  Suicidal thoughts 0 0 0 0 0  PHQ-9 Score 0 3 0 0 0  Difficult doing work/chores    Not difficult at all     phq 9 is negative   Fall Risk:    09/12/2022    8:22 AM 06/11/2022    2:51 PM 03/04/2022    2:24 PM 12/03/2021    3:03 PM 07/06/2021    9:48 AM  Fall Risk   Falls in the past year? 0 1 0 1 1  Number falls in past yr: 0 0 0 0 1  Injury with Fall? 0 0 0 0 1  Risk for fall due to : No Fall Risks No Fall Risks No Fall Risks Impaired balance/gait   Follow up Falls prevention discussed Falls prevention discussed Falls prevention discussed Falls prevention discussed       Functional Status Survey: Is the patient deaf or have difficulty hearing?: No Does the patient have difficulty seeing, even when wearing glasses/contacts?: Yes Does the patient have difficulty concentrating, remembering, or making decisions?: No Does the patient have difficulty walking or climbing stairs?: Yes Does the patient have difficulty dressing or bathing?: No Does the patient have difficulty doing errands alone such as visiting a doctor's office or shopping?: No    Assessment & Plan  1. Hypertension associated with type 2 diabetes mellitus ([place])  At goal  - POCT HgB A1C  2. Morbid obesity (  [place])  Discussed with the patient the risk posed by an increased BMI. Discussed importance of portion control, calorie counting and at least 150 minutes of physical activity weekly. Avoid sweet beverages and drink more water. Eat at least 6  servings of fruit and vegetables daily    3 Need for immunization against influenza  - Flu Vaccine QUAD 6+ mos PF IM (Fluarix Quad PF)  4. Chronic nonmalignant pain  - pregabalin (LYRICA) 225 MG capsule; Take 1 capsule (225 mg total) by mouth 2 (two) times daily.  Dispense: 180 capsule; Refill: 1  5. Chronic midline low back pain with sciatica, sciatica laterality unspecified  - pregabalin (LYRICA) 225 MG capsule; Take 1 capsule (225 mg total) by mouth 2 (two) times daily.  Dispense: 180 capsule; Refill: 1  6. Hypertension, benign \\\ - valsartan (DIOVAN) 320 MG tablet; Take 1 tablet (320 mg total) by mouth daily.  Dispense: 90 tablet; Refill: 1  7. Acquired hypothyroidism  Recheck TSH  8. Chronic midline low back pain with bilateral sciatica  Stable, off hydrocodone due to problems with insurance coverage and pain level is about the same, advised 3 g of Tylenol max per day, currently takes goody powders occasionally but explained risk of GI bleed due to history of bariatric surgery   9. B12 deficiency  Continue supplements  10. Vitamin D deficiency  On supplementation   11. Anemia, unspecified type  Recheck labs today , he has insurance again   12. Bariatric surgery status   13. Controlled gout   14. Need for shingles vaccine  - Zoster Vaccine Adjuvanted [facility]) injection; Inject 0.5 mLs into the muscle once for 1 dose.  Dispense: 0.5 mL; Refill: 0

## 2022-09-12 ENCOUNTER — Encounter: Payer: Self-pay | Admitting: Family Medicine

## 2022-09-12 ENCOUNTER — Ambulatory Visit (INDEPENDENT_AMBULATORY_CARE_PROVIDER_SITE_OTHER): Payer: Medicare Other | Admitting: Family Medicine

## 2022-09-12 VITALS — BP 130/72 | HR 74 | Resp 16 | Ht 69.0 in | Wt 310.0 lb

## 2022-09-12 DIAGNOSIS — E1159 Type 2 diabetes mellitus with other circulatory complications: Secondary | ICD-10-CM | POA: Diagnosis not present

## 2022-09-12 DIAGNOSIS — Z23 Encounter for immunization: Secondary | ICD-10-CM | POA: Diagnosis not present

## 2022-09-12 DIAGNOSIS — Z9884 Bariatric surgery status: Secondary | ICD-10-CM

## 2022-09-12 DIAGNOSIS — M5442 Lumbago with sciatica, left side: Secondary | ICD-10-CM

## 2022-09-12 DIAGNOSIS — G8929 Other chronic pain: Secondary | ICD-10-CM

## 2022-09-12 DIAGNOSIS — M5441 Lumbago with sciatica, right side: Secondary | ICD-10-CM

## 2022-09-12 DIAGNOSIS — I152 Hypertension secondary to endocrine disorders: Secondary | ICD-10-CM | POA: Diagnosis not present

## 2022-09-12 DIAGNOSIS — E538 Deficiency of other specified B group vitamins: Secondary | ICD-10-CM

## 2022-09-12 DIAGNOSIS — I10 Essential (primary) hypertension: Secondary | ICD-10-CM

## 2022-09-12 DIAGNOSIS — D649 Anemia, unspecified: Secondary | ICD-10-CM

## 2022-09-12 DIAGNOSIS — E559 Vitamin D deficiency, unspecified: Secondary | ICD-10-CM

## 2022-09-12 DIAGNOSIS — E039 Hypothyroidism, unspecified: Secondary | ICD-10-CM

## 2022-09-12 DIAGNOSIS — M109 Gout, unspecified: Secondary | ICD-10-CM

## 2022-09-12 LAB — POCT GLYCOSYLATED HEMOGLOBIN (HGB A1C): Hemoglobin A1C: 6.5 % — AB (ref 4.0–5.6)

## 2022-09-12 MED ORDER — VALSARTAN 320 MG PO TABS
320.0000 mg | ORAL_TABLET | Freq: Every day | ORAL | 1 refills | Status: DC
Start: 2022-09-12 — End: 2023-03-12

## 2022-09-12 MED ORDER — SHINGRIX 50 MCG/0.5ML IM SUSR: 0.5000 mL | Freq: Once | INTRAMUSCULAR | 0 refills | Status: AC

## 2022-09-12 MED ORDER — QUETIAPINE FUMARATE 25 MG PO TABS: 25.0000 mg | ORAL_TABLET | Freq: Every day | ORAL | 1 refills | Status: DC

## 2022-09-12 MED ORDER — PREGABALIN 225 MG PO CAPS: 225.0000 mg | ORAL_CAPSULE | Freq: Two times a day (BID) | ORAL | 1 refills | Status: DC

## 2022-09-12 NOTE — Assessment & Plan Note (Signed)
He has been out of hydrocodone since early sep 2023 due to insurance coverage exclusion, pain level is stable at 6/10, discussed otc Tylenol max of 3 g per day Avoid nsaid's due to history of bariatric surgery

## 2022-09-13 LAB — COMPLETE METABOLIC PANEL WITH GFR
AG Ratio: 1.8 (calc) (ref 1.0–2.5)
ALT: 9 U/L (ref 9–46)
AST: 16 U/L (ref 10–35)
Albumin: 4.2 g/dL (ref 3.6–5.1)
Alkaline phosphatase (APISO): 113 U/L (ref 35–144)
BUN/Creatinine Ratio: 16 (calc) (ref 6–22)
BUN: 23 mg/dL (ref 7–25)
CO2: 26 mmol/L (ref 20–32)
Calcium: 8.9 mg/dL (ref 8.6–10.3)
Chloride: 108 mmol/L (ref 98–110)
Creat: 1.4 mg/dL — ABNORMAL HIGH (ref 0.70–1.30)
Globulin: 2.3 g/dL (calc) (ref 1.9–3.7)
Glucose, Bld: 130 mg/dL — ABNORMAL HIGH (ref 65–99)
Potassium: 4.7 mmol/L (ref 3.5–5.3)
Sodium: 141 mmol/L (ref 135–146)
Total Bilirubin: 0.7 mg/dL (ref 0.2–1.2)
Total Protein: 6.5 g/dL (ref 6.1–8.1)
eGFR: 61 mL/min/{1.73_m2} (ref >=60)

## 2022-09-13 LAB — LIPID PANEL
Cholesterol: 125 mg/dL (ref <200)
HDL: 35 mg/dL — ABNORMAL LOW (ref >=40)
LDL Cholesterol (Calc): 73 mg/dL (calc)
Non-HDL Cholesterol (Calc): 90 mg/dL (calc) (ref <130)
Total CHOL/HDL Ratio: 3.6 (calc) (ref <5.0)
Triglycerides: 86 mg/dL (ref <150)

## 2022-09-13 LAB — IRON,TIBC AND FERRITIN PANEL
%SAT: 39 % (calc) (ref 20–48)
Ferritin: 60 ng/mL (ref 38–380)
Iron: 91 ug/dL (ref 50–180)
TIBC: 235 mcg/dL (calc) — ABNORMAL LOW (ref 250–425)

## 2022-09-13 LAB — CBC WITH DIFFERENTIAL/PLATELET
Absolute Monocytes: 440 cells/uL (ref 200–950)
Basophils Absolute: 30 cells/uL (ref 0–200)
Basophils Relative: 0.6 %
Eosinophils Absolute: 220 cells/uL (ref 15–500)
Eosinophils Relative: 4.4 %
HCT: 40.3 % (ref 38.5–50.0)
Hemoglobin: 13 g/dL — ABNORMAL LOW (ref 13.2–17.1)
Lymphs Abs: 940 cells/uL (ref 850–3900)
MCH: 29 pg (ref 27.0–33.0)
MCHC: 32.3 g/dL (ref 32.0–36.0)
MCV: 90 fL (ref 80.0–100.0)
MPV: 10.4 fL (ref 7.5–12.5)
Monocytes Relative: 8.8 %
Neutro Abs: 3370 cells/uL (ref 1500–7800)
Neutrophils Relative %: 67.4 %
Platelets: 184 10*3/uL (ref 140–400)
RBC: 4.48 10*6/uL (ref 4.20–5.80)
RDW: 12.4 % (ref 11.0–15.0)
Total Lymphocyte: 18.8 %
WBC: 5 10*3/uL (ref 3.8–10.8)

## 2022-09-13 LAB — MICROALBUMIN / CREATININE URINE RATIO
Creatinine, Urine: 190 mg/dL (ref 20–320)
Microalb Creat Ratio: 2 mcg/mg creat (ref <30)
Microalb, Ur: 0.4 mg/dL

## 2022-09-13 LAB — VITAMIN B12: Vitamin B-12: 225 pg/mL (ref 200–1100)

## 2022-09-13 LAB — VITAMIN D 25 HYDROXY (VIT D DEFICIENCY, FRACTURES): Vit D, 25-Hydroxy: 36 ng/mL (ref 30–100)

## 2022-09-13 LAB — TSH: TSH: 0.18 mIU/L — ABNORMAL LOW (ref 0.40–4.50)

## 2022-09-25 ENCOUNTER — Other Ambulatory Visit: Payer: Self-pay | Admitting: Family Medicine

## 2022-09-25 ENCOUNTER — Encounter: Payer: Self-pay | Admitting: Family Medicine

## 2022-09-25 DIAGNOSIS — E039 Hypothyroidism, unspecified: Secondary | ICD-10-CM

## 2022-09-27 ENCOUNTER — Other Ambulatory Visit: Payer: Self-pay | Admitting: Family Medicine

## 2022-09-27 DIAGNOSIS — R6882 Decreased libido: Secondary | ICD-10-CM

## 2022-09-27 DIAGNOSIS — Z8547 Personal history of malignant neoplasm of testis: Secondary | ICD-10-CM

## 2022-10-07 ENCOUNTER — Ambulatory Visit (INDEPENDENT_AMBULATORY_CARE_PROVIDER_SITE_OTHER): Payer: Medicare Other

## 2022-10-07 DIAGNOSIS — E538 Deficiency of other specified B group vitamins: Secondary | ICD-10-CM | POA: Diagnosis not present

## 2022-10-07 MED ORDER — CYANOCOBALAMIN 1000 MCG/ML IJ SOLN
1000.0000 ug | Freq: Once | INTRAMUSCULAR | Status: AC
  Administered 2022-10-07: 1000 ug via INTRAMUSCULAR

## 2022-10-08 LAB — HM DIABETES EYE EXAM

## 2022-10-10 ENCOUNTER — Ambulatory Visit (INDEPENDENT_AMBULATORY_CARE_PROVIDER_SITE_OTHER): Payer: Medicare Other | Admitting: Urology

## 2022-10-10 ENCOUNTER — Encounter: Payer: Self-pay | Admitting: Urology

## 2022-10-10 VITALS — BP 132/86 | HR 58 | Ht 69.0 in | Wt 319.0 lb

## 2022-10-10 DIAGNOSIS — E291 Testicular hypofunction: Secondary | ICD-10-CM

## 2022-10-10 DIAGNOSIS — R6882 Decreased libido: Secondary | ICD-10-CM

## 2022-10-10 DIAGNOSIS — Z8547 Personal history of malignant neoplasm of testis: Secondary | ICD-10-CM | POA: Diagnosis not present

## 2022-10-10 DIAGNOSIS — C6290 Malignant neoplasm of unspecified testis, unspecified whether descended or undescended: Secondary | ICD-10-CM

## 2022-10-10 LAB — URINALYSIS, COMPLETE
Bilirubin, UA: NEGATIVE
Glucose, UA: NEGATIVE
Ketones, UA: NEGATIVE
Leukocytes,UA: NEGATIVE
Nitrite, UA: NEGATIVE
Protein,UA: NEGATIVE
RBC, UA: NEGATIVE
Specific Gravity, UA: 1.015 (ref 1.005–1.030)
Urobilinogen, Ur: 0.2 mg/dL (ref 0.2–1.0)
pH, UA: 5 (ref 5.0–7.5)

## 2022-10-10 LAB — MICROSCOPIC EXAMINATION
Bacteria, UA: NONE SEEN
Epithelial Cells (non renal): NONE SEEN /hpf (ref 0–10)

## 2022-10-11 ENCOUNTER — Encounter: Payer: Self-pay | Admitting: Urology

## 2022-10-11 ENCOUNTER — Telehealth: Payer: Self-pay | Admitting: *Deleted

## 2022-10-11 NOTE — Telephone Encounter (Signed)
-----   Message from Abbie Sons, MD sent at 10/11/2022  6:57 AM EDT ----- Regarding: Labs Please add on a PSA to his lab visit.  Use prostate cancer screening code

## 2022-10-11 NOTE — Progress Notes (Signed)
10/10/2022 6:49 AM   [name] [birth year] [id number]  Referring provider: [name], MD [address] [place],  [place] [address]  Chief Complaint  Patient presents with   Hypogonadism    HPI: [name] is a 51 y.o. male referred for hypogonadism.  He presents today with his wife.  History testicular cancer 1999 treated with orchiectomy and radiation Was diagnosed with hypogonadism and a few years after surgery and was on testosterone gel for a < 1 year He discontinued due to concerns of transference No recurrent symptoms until recently with complaints of significant decreased libido Mild ED Denies low energy/fatigue No recent labs drawn Does have obstructive sleep apnea on CPAP History of chronic opiate therapy   PMH: Past Medical History:  Diagnosis Date   Allergy    Gout    Hiatal hernia    Hypothyroidism    Metabolic syndrome    Obesity    OSA on CPAP    11 mmhg CPAP   Radiculopathy    Seborrhea    Testicular hypofunction    Urine ketone    Vitamin D deficiency     Surgical History: Past Surgical History:  Procedure Laterality Date   BACK SURGERY     X 2   HERNIA REPAIR     left side and umbilical hernia repair   ROUX-EN-Y GASTRIC BYPASS  2.28.13   URETEROTOMY Left 2000    Home Medications:  Allergies as of 10/10/2022       Reactions   Morphine Itching        Medication List        Accurate as of October 10, 2022 11:59 PM. If you have any questions, ask your nurse or doctor.          Allergy Relief 180 MG tablet Generic drug: fexofenadine TAKE 1 TABLET(180 MG) BY MOUTH DAILY   allopurinol 100 MG tablet Commonly known as: ZYLOPRIM Take 1 tablet (100 mg total) by mouth 2 (two) times daily.   blood glucose meter kit and supplies Dispense based on patient and insurance preference. Use up to four times daily as directed. (FOR ICD-10 E10.9, E11.9).   busPIRone 5 MG tablet Commonly known as:  BUSPAR TAKE 1 TO 2 TABLETS BY MOUTH THREE TIMES DAILY   cyclobenzaprine 10 MG tablet Commonly known as: FLEXERIL Take 1 tablet (10 mg total) by mouth at bedtime. TAKE 1 TABLET BY MOUTH AT BEDTIME   DULoxetine 60 MG capsule Commonly known as: CYMBALTA Take 1 capsule (60 mg total) by mouth daily.   Magnesium Oxide -Mg Supplement 500 MG Tabs Take 1 tablet by mouth once daily   metFORMIN 750 MG 24 hr tablet Commonly known as: GLUCOPHAGE-XR Take 1 tablet (750 mg total) by mouth daily with breakfast.   pregabalin 225 MG capsule Commonly known as: LYRICA Take 1 capsule (225 mg total) by mouth 2 (two) times daily.   QUEtiapine 25 MG tablet Commonly known as: SEROQUEL Take 1 tablet (25 mg total) by mouth at bedtime.   rosuvastatin 5 MG tablet Commonly known as: CRESTOR Take 1 tablet (5 mg total) by mouth daily.   Rybelsus 14 MG Tabs Generic drug: Semaglutide Take 1 tablet (14 mg total) by mouth daily.   Synthroid 137 MCG tablet Generic drug: levothyroxine TAKE 1 TABLET BY MOUTH EVERY DAY AND 1 AND 1/2 ON SUNDAYS   traZODone 100 MG tablet Commonly known as: DESYREL TAKE 1 & 1/2 (ONE & ONE-HALF) TABLETS BY MOUTH  AT BEDTIME   valsartan 320 MG tablet Commonly known as: DIOVAN Take 1 tablet (320 mg total) by mouth daily.        Allergies:  Allergies  Allergen Reactions   Morphine Itching    Family History: Family History  Problem Relation Age of Onset   Coronary artery disease Mother    Cancer Father        Melanoma and Testicular   Diabetes Father     Social History:  reports that he quit smoking about 30 years ago. His smoking use included cigarettes, cigars, and pipe. He started smoking about 38 years ago. He has a 24.00 pack-year smoking history. He quit smokeless tobacco use about 30 years ago.  His smokeless tobacco use included chew. He reports that he does not drink alcohol and does not use drugs.   Physical Exam: BP 132/86   Pulse (!) 58   Ht 5' 9"   (1.753 m)   Wt (!) 319 lb (144.7 kg)   BMI 47.11 kg/m   Constitutional:  Alert and oriented, No acute distress. HEENT: [place] AT, moist mucus membranes.  Trachea midline, no masses. Cardiovascular: No clubbing, cyanosis, or edema. Respiratory: Normal respiratory effort, no increased work of breathing. GI: Abdomen is soft, nontender, nondistended, no abdominal masses GU: Phallus without lesions.  Right testis surgically absent.  Left testis palpably normal    Assessment & Plan:    1.  Hypogonadism Symptoms of low libido and ED We discussed the diagnosis of testosterone is based on 2 abnormal total testosterone levels drawn in the a.m. admitted with signs and symptoms of low testosterone. We discussed various forms of testosterone placement including topical preparations, intramuscular injections, subcutaneous injections, subcutaneous pellet implantation and oral testosterone.  Pros and cons of each form were discussed.  The risk of transference of topical testosterone. I had an extensive discussion regarding testosterone replacement therapy including the following: Treatment may result in improvements in erectile function, low sex drive, anemia, bone mineral density, lean body mass, and depressive symptoms; evidence is inconclusive whether testosterone therapy improves cognitive function, measures of diabetes, energy, fatigue, lipid profiles, and quality of life measures; there is no conclusive evidence linking testosterone therapy to the development of prostate cancer; there is no definitive evidence linking testosterone therapy to a higher incidence of venothrombolic events; at the present time it cannot be stated definitively whether testosterone therapy increases or decreases the risk of cardiovascular events including myocardial infarction and stroke. Potential side effects were discussed including erythrocytosis, gynecomastia.  The need for regular monitoring of testosterone levels and  hematocrit was discussed. No previous labs and Epic documenting low testosterone Schedule a.m. lab visit for testosterone x2.  Check LH and baseline PSA Preferred method TRT-injections   Abbie Sons, MD  [facility] [address], [place], [place] [address] [phone]

## 2022-10-11 NOTE — Telephone Encounter (Signed)
We added psa on to 10/15/2022 lab visit

## 2022-10-14 ENCOUNTER — Other Ambulatory Visit: Payer: Self-pay

## 2022-10-14 DIAGNOSIS — Z8547 Personal history of malignant neoplasm of testis: Secondary | ICD-10-CM

## 2022-10-14 DIAGNOSIS — Z125 Encounter for screening for malignant neoplasm of prostate: Secondary | ICD-10-CM

## 2022-10-14 DIAGNOSIS — E291 Testicular hypofunction: Secondary | ICD-10-CM

## 2022-10-15 ENCOUNTER — Other Ambulatory Visit: Payer: Medicare Other

## 2022-10-15 DIAGNOSIS — E291 Testicular hypofunction: Secondary | ICD-10-CM

## 2022-10-15 DIAGNOSIS — Z8547 Personal history of malignant neoplasm of testis: Secondary | ICD-10-CM

## 2022-10-15 DIAGNOSIS — Z125 Encounter for screening for malignant neoplasm of prostate: Secondary | ICD-10-CM

## 2022-10-16 ENCOUNTER — Other Ambulatory Visit: Payer: Self-pay | Admitting: Urology

## 2022-10-16 ENCOUNTER — Encounter: Payer: Self-pay | Admitting: *Deleted

## 2022-10-16 LAB — PSA: Prostate Specific Ag, Serum: 0.5 ng/mL (ref 0.0–4.0)

## 2022-10-16 LAB — LUTEINIZING HORMONE: LH: 32.1 m[IU]/mL — ABNORMAL HIGH (ref 1.7–8.6)

## 2022-10-16 LAB — TESTOSTERONE: Testosterone: 152 ng/dL — ABNORMAL LOW (ref 264–916)

## 2022-10-16 MED ORDER — TESTOSTERONE CYPIONATE 200 MG/ML IM SOLN: 200.0000 mg | INTRAMUSCULAR | 0 refills | Status: DC

## 2022-10-22 ENCOUNTER — Other Ambulatory Visit: Payer: Medicare Other

## 2022-10-22 DIAGNOSIS — E291 Testicular hypofunction: Secondary | ICD-10-CM

## 2022-10-23 LAB — TESTOSTERONE: Testosterone: 443 ng/dL (ref 264–916)

## 2022-10-28 NOTE — Progress Notes (Unsigned)
[name] is a 51 y.o.  male with testosterone deficiency who presents today for instruction on delivering an IM injection of testosterone cypionate.    I instructed the patient to identify the concentration of his testosterone. Testosterone for injection is usually in the form of testosterone cypionate. These liquids come in multiple concentrations, so before giving an injection, it's very important to make sure that his intended dosage takes into account the concentration of the testosterone serum. Usually, testosterone comes in a concentration of either 100 mg/ml or 200 mg/ml.  We typically use the 200 mg/mL in this office.    Using a sterile, 18 G needle and 3 cc syringe, the testosterone cypionate was drawn up for a ***  cc injection.  To draw up the dose, I demonstrated how to first draw air into the syringe equal to the volume of the dosage. Then, wipe the top of the medication bottle with an alcohol wipe, insert the needle through the lid and into the medication, and push the air from your syringe into the bottle. Turn the bottle upside down and draw out the exact dosage of testosterone.  I demonstrated how to aspirate the syringe by hinge the syringe with its needle uncapped and pointing up in front of him.  Looking for air bubbles in the syringe. Flick the side of the syringe to get these bubbles to rise to the top.    When the dosage is bubble-free, I slowly depressed the plunger to force the air at the top of the syringe out stopping when a tiny drop of medication comes out of the tip of the syringe.  I advised him to be certain no air remained in the syringe as injecting air is very dangerous.  Being careful not to squirt or spray a significant portion of the dosage onto the floor.  Preparing the injection site, outer middle third of the vastus lateralis muscle of the thigh, I took a sterile alcohol pad and wipe the immediate area around where I intended him to inject.   I then  demonstrated how to change the needle from the 18 G to the 21 G needle.  I then gave him the syringe for injecting.  He correctly identified the injection location.  He held the syringe like a dart at a 90-degree angle above the sterile injection site. Quickly plunged it into the flesh. Before depressing the plunger, he drew back on it slightly and no blood was seen.  I advised him that if blood flashed in the syringe, he would need to remove the needle and then select a different location as he was in the vein.  Inject the medication at a steady, controlled pace.  He fully depressed the plunger, slowly pull the needle out. Pressing around the injection site with a sterile cotton swab as he did so - this preventing the emerging needle from pulling on the skin and causing extra pain.  We assessed the needle entry point for bleeding, and applied a sterile Band-Aid and/or cotton swab if needed. Disposed of the used needle and syringe in a proper sharps container.  I advised him to acquire a sharps container for his personal use at home.  I advised him that If, after injection, he experienced redness, swelling, or discomfort beyond that of normal soreness at the site of injection, call our office for an appointment and instructions.  He is to always store his medication at the recommended temperature, and always check the expiration date  on the bottle. If it's expired, don't use it.  Of course, keep all of med's out of reach of children.  Do not change his dose without consulting your provider.  His starting dose will be 1 cc every 2 weeks.  He will return 1 week after his fourth injection for a testosterone level  William Caileen Veracruz, PA-C

## 2022-10-29 ENCOUNTER — Other Ambulatory Visit: Payer: Self-pay | Admitting: Family Medicine

## 2022-10-29 ENCOUNTER — Ambulatory Visit (INDEPENDENT_AMBULATORY_CARE_PROVIDER_SITE_OTHER): Payer: Medicare Other | Admitting: Urology

## 2022-10-29 ENCOUNTER — Encounter: Payer: Self-pay | Admitting: Family Medicine

## 2022-10-29 ENCOUNTER — Encounter: Payer: Self-pay | Admitting: Urology

## 2022-10-29 VITALS — BP 113/74 | HR 88 | Ht 69.0 in | Wt 319.0 lb

## 2022-10-29 DIAGNOSIS — E291 Testicular hypofunction: Secondary | ICD-10-CM

## 2022-10-29 DIAGNOSIS — E349 Endocrine disorder, unspecified: Secondary | ICD-10-CM

## 2022-11-12 ENCOUNTER — Encounter: Payer: Self-pay | Admitting: Family Medicine

## 2022-11-12 ENCOUNTER — Ambulatory Visit (INDEPENDENT_AMBULATORY_CARE_PROVIDER_SITE_OTHER): Payer: Medicare Other | Admitting: Family Medicine

## 2022-11-12 VITALS — BP 128/72 | HR 78 | Temp 98.0°F | Resp 16 | Ht 69.0 in | Wt 319.0 lb

## 2022-11-12 DIAGNOSIS — Z9889 Other specified postprocedural states: Secondary | ICD-10-CM | POA: Diagnosis not present

## 2022-11-12 DIAGNOSIS — M7542 Impingement syndrome of left shoulder: Secondary | ICD-10-CM

## 2022-11-12 DIAGNOSIS — Z9181 History of falling: Secondary | ICD-10-CM | POA: Diagnosis not present

## 2022-11-12 DIAGNOSIS — J3089 Other allergic rhinitis: Secondary | ICD-10-CM

## 2022-11-12 DIAGNOSIS — E538 Deficiency of other specified B group vitamins: Secondary | ICD-10-CM | POA: Diagnosis not present

## 2022-11-12 DIAGNOSIS — R1902 Left upper quadrant abdominal swelling, mass and lump: Secondary | ICD-10-CM

## 2022-11-12 DIAGNOSIS — Z8719 Personal history of other diseases of the digestive system: Secondary | ICD-10-CM

## 2022-11-12 DIAGNOSIS — R051 Acute cough: Secondary | ICD-10-CM

## 2022-11-12 DIAGNOSIS — J302 Other seasonal allergic rhinitis: Secondary | ICD-10-CM

## 2022-11-12 MED ORDER — FLUTICASONE PROPIONATE 50 MCG/ACT NA SUSP: 2.0000 | Freq: Every day | NASAL | 6 refills | Status: DC

## 2022-11-12 MED ORDER — CYANOCOBALAMIN 1000 MCG/ML IJ SOLN
1000.0000 ug | Freq: Once | INTRAMUSCULAR | Status: AC
  Administered 2022-11-12: 1000 ug via INTRAMUSCULAR

## 2022-11-12 MED ORDER — BENZONATATE 100 MG PO CAPS: 100.0000 mg | ORAL_CAPSULE | Freq: Two times a day (BID) | ORAL | 0 refills | Status: DC | PRN

## 2022-11-12 NOTE — Progress Notes (Signed)
Name: [name]   MRN: [id number]    DOB: [birth year]   Date:11/12/2022       Progress Note  Subjective  Chief Complaint  Cough  HPI  Recent fall: he fell a few weeks ago and hit his left shoulder on the ground. He went to  Urgent care on 11/13 and had negative x-rays of shoulder and clavicle and was sent home on hydrocodone and prednisone . He states pain is still present and has difficulty abducting his left shoulder, since the fall he has also noticed a bulging on Left upper quadrant, he states same area he had a hernia repair years ago. 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   Carpal tunnel syndrome 06/29/2015   Osteoarthritis 06/29/2015   Claustrophobia 06/29/2015   Foot drop, left 06/29/2015   Hiatal hernia 06/29/2015   H/O diabetes mellitus 06/29/2015   H/O testicular cancer 06/29/2015   Acquired hypothyroidism 03/54/6568   Dysmetabolic syndrome 12/75/1700   Morbid obesity ([place]) 06/29/2015   Allergic rhinitis 06/29/2015   Neuropathy 06/29/2015   Seborrheic keratoses 06/29/2015   Vitamin D deficiency 06/29/2015   Bariatric surgery status 02/20/2012    Past Surgical History:  Procedure Laterality Date   BACK SURGERY     X 2   HERNIA REPAIR     left side and umbilical hernia repair   ROUX-EN-Y GASTRIC BYPASS  2.28.13   URETEROTOMY Left 2000    Family History  Problem Relation Age of Onset   Coronary artery disease Mother    Cancer Father        Melanoma and Testicular    Diabetes Father     Social History   Tobacco Use   Smoking status: Former    Packs/day: 3.00    Years: 8.00    Total pack years: 24.00    Types: Cigarettes, Cigars, Pipe    Start date: 06/28/1984    Quit date: 12/24/1991    Years since quitting: 30.9   Smokeless tobacco: Former    Types: Chew    Quit date: 12/24/1991  Substance Use Topics   Alcohol use: No    Alcohol/week: 0.0 standard drinks of alcohol     Current Outpatient Medications:    ALLERGY RELIEF 180 MG tablet, TAKE 1 TABLET(180 MG) BY MOUTH DAILY, Disp: 90 tablet, Rfl: 0   allopurinol (ZYLOPRIM) 100 MG tablet, Take 1 tablet (100 mg total) by mouth 2 (two) times daily., Disp: 180 tablet, Rfl: 1   blood glucose meter kit and supplies, Dispense based on patient and insurance preference. Use up to four times daily as directed. (FOR ICD-10 E10.9, E11.9)., Disp: 1 each, Rfl: 0   busPIRone (BUSPAR) 5 MG tablet, TAKE 1 TO 2 TABLETS BY MOUTH THREE TIMES DAILY, Disp: 180 tablet, Rfl: 0   cyclobenzaprine (FLEXERIL) 10 MG tablet, Take 1 tablet (10 mg total) by mouth at bedtime. TAKE 1 TABLET BY MOUTH AT BEDTIME, Disp: 90 tablet, Rfl: 1   DULoxetine (CYMBALTA) 60 MG capsule, Take 1 capsule (60 mg  total) by mouth daily., Disp: 90 capsule, Rfl: 1   Magnesium Oxide 500 MG TABS, Take 1 tablet by mouth once daily, Disp: 90 tablet, Rfl: 0   metFORMIN (GLUCOPHAGE-XR) 750 MG 24 hr tablet, Take 1 tablet (750 mg total) by mouth daily with breakfast., Disp: 90 tablet, Rfl: 1   pregabalin (LYRICA) 225 MG capsule, Take 1 capsule (225 mg total) by mouth 2 (two) times daily., Disp: 180 capsule, Rfl: 1   QUEtiapine (SEROQUEL) 25 MG tablet, Take 1 tablet (25 mg total) by mouth at bedtime., Disp: 90 tablet, Rfl: 1   rosuvastatin (CRESTOR) 5 MG tablet, Take 1 tablet (5 mg total) by mouth daily., Disp: 90 tablet, Rfl: 1   Semaglutide (RYBELSUS) 14 MG TABS, Take 1 tablet (14 mg total) by mouth daily., Disp: 90 tablet, Rfl: 1   SYNTHROID 137 MCG tablet, TAKE 1  TABLET BY MOUTH EVERY DAY AND 1 AND 1/2 ON SUNDAYS, Disp: 90 tablet, Rfl: 1   testosterone cypionate (DEPOTESTOSTERONE CYPIONATE) 200 MG/ML injection, Inject 1 mL (200 mg total) into the muscle every 14 (fourteen) days., Disp: 4 mL, Rfl: 0   traZODone (DESYREL) 100 MG tablet, TAKE 1 & 1/2 (ONE & ONE-HALF) TABLETS BY MOUTH AT BEDTIME, Disp: 135 tablet, Rfl: 1   valsartan (DIOVAN) 320 MG tablet, Take 1 tablet (320 mg total) by mouth daily., Disp: 90 tablet, Rfl: 1  Allergies  Allergen Reactions   Morphine Itching    I personally reviewed active problem list, medication list, allergies, family history, social history, health maintenance with the patient/caregiver today.   ROS  Ten systems reviewed and is negative except as mentioned in HPI   Objective  Vitals:   11/12/22 1448  BP: 128/72  Pulse: 78  Resp: 16  Temp: 98 F (36.7 C)  TempSrc: Oral  SpO2: 96%  Weight: (!) 319 lb (144.7 kg)  Height: _0  (1.753 m)    Body mass index is 47.11 kg/m.  Physical Exam  Constitutional: Patient appears well-developed and well-nourished. Obese  No distress.  HEENT: head atraumatic, normocephalic, pupils equal and reactive to ligh, neck suppl Cardiovascular: Normal rate, regular rhythm and normal heart sounds.  No murmur heard. No BLE edema. Pulmonary/Chest: Effort normal and breath sounds normal. No respiratory distress. Abdominal: Soft.  There is left upper quadrant discomfort, worse on the area of oval mass, seems more fatty mass than hernia, no redness or increase in warmth, normal bowel sounds Muscular skeletal: pain with abduction of left shoulder and internal rotation, positive impingement sign  Psychiatric: Patient has a normal mood and affect. behavior is normal. Judgment and thought content normal.    PHQ2/9:    11/12/2022    2:48 PM 09/12/2022    8:22 AM 06/11/2022    2:51 PM 03/04/2022    2:24 PM 12/03/2021    3:04 PM  Depression screen PHQ 2/9  Decreased Interest 0 0 0  0 0  Down, Depressed, Hopeless 0 0 0 0 0  PHQ - 2 Score 0 0 0 0 0  Altered sleeping 0 0 3 0 0  Tired, decreased energy 0 0 0 0 0  Change in appetite 0 0 0 0 0  Feeling bad or failure about yourself  0 0 0 0 0  Trouble concentrating 0 0 0 0 0  Moving slowly or fidgety/restless 0 0 0 0 0  Suicidal thoughts 0 0 0 0 0  PHQ-9 Score 0 0 3 0 0  Difficult doing work/chores  Not difficult at all    phq 9 is negative   Fall Risk:    11/12/2022    2:48 PM 09/12/2022    8:22 AM 06/11/2022    2:51 PM 03/04/2022    2:24 PM 12/03/2021    3:03 PM  Fall Risk   Falls in the past year? 1 0 1 0 1  Number falls in past yr: 0 0 0 0 0  Injury with Fall? 1 0 0 0 0  Risk for fall due to : No Fall Risks No Fall Risks No Fall Risks No Fall Risks Impaired balance/gait  Follow up _0       Functional Status Survey: Is the patient deaf or have difficulty hearing?: No Does the patient have difficulty seeing, even when wearing glasses/contacts?: Yes Does the patient have difficulty concentrating, remembering, or making decisions?: No Does the patient have difficulty walking or climbing stairs?: Yes Does the patient have difficulty dressing or bathing?: No Does the patient have difficulty doing errands alone such as visiting a doctor's office or shopping?: No    Assessment & Plan  1. Left upper quadrant abdominal mass  - US Abdomen Limited; Future  2. History of recent fall   3. History of hernia repair  - US Abdomen Limited; Future  4. Shoulder impingement syndrome, left  - Ambulatory referral to Orthopedic Surgery  5. Acute cough  - benzonatate (TESSALON) 100 MG capsule; Take 1-2 capsules (100-200 mg total) by mouth 2 (two) times daily as needed.  Dispense: 40 capsule; Refill: 0  6. Perennial allergic rhinitis with seasonal variation  - fluticasone  (FLONASE) 50 MCG/ACT nasal spray; Place 2 sprays into both nostrils daily.  Dispense: 16 g; Refill: 6  7. B12 deficiency  - cyanocobalamin (VITAMIN B12) injection 1,000 mcg .

## 2022-11-13 ENCOUNTER — Encounter: Payer: Self-pay | Admitting: Family Medicine

## 2022-11-17 ENCOUNTER — Other Ambulatory Visit: Payer: Self-pay | Admitting: Family Medicine

## 2022-11-17 DIAGNOSIS — M7542 Impingement syndrome of left shoulder: Secondary | ICD-10-CM

## 2022-11-18 ENCOUNTER — Ambulatory Visit
Admission: RE | Admit: 2022-11-18 | Discharge: 2022-11-18 | Disposition: A | Discharge status: Home / Self Care | Payer: Medicare Other | Source: Ambulatory Visit | Attending: Family Medicine | Admitting: Family Medicine

## 2022-11-18 DIAGNOSIS — R1902 Left upper quadrant abdominal swelling, mass and lump: Secondary | ICD-10-CM | POA: Insufficient documentation

## 2022-11-18 DIAGNOSIS — Z9889 Other specified postprocedural states: Secondary | ICD-10-CM | POA: Insufficient documentation

## 2022-11-18 DIAGNOSIS — Z8719 Personal history of other diseases of the digestive system: Secondary | ICD-10-CM | POA: Insufficient documentation

## 2022-11-22 ENCOUNTER — Ambulatory Visit (INDEPENDENT_AMBULATORY_CARE_PROVIDER_SITE_OTHER): Payer: Medicare Other | Admitting: Family Medicine

## 2022-11-22 ENCOUNTER — Ambulatory Visit: Payer: Self-pay

## 2022-11-22 VITALS — BP 120/73 | Ht 69.0 in | Wt 315.0 lb

## 2022-11-22 DIAGNOSIS — M25512 Pain in left shoulder: Secondary | ICD-10-CM | POA: Diagnosis not present

## 2022-11-22 MED ORDER — METHYLPREDNISOLONE ACETATE 40 MG/ML IJ SUSP
40.0000 mg | Freq: Once | INTRAMUSCULAR | Status: AC
  Administered 2022-11-22: 40 mg via INTRA_ARTICULAR

## 2022-11-22 NOTE — Progress Notes (Unsigned)
[name] - 51 y.o. male MRN [id number]  Date of birth: 09-Apr-1971    CHIEF COMPLAINT:   Left shoulder pain    SUBJECTIVE:   HPI:  51 year old male comes to clinic to be evaluated for left shoulder pain.  About 3 weeks ago he tripped while carrying a ladder and fell and landed on his left shoulder.  Subsequently developed pain on the lateral and posterior aspect of the shoulder.  He was seen by a doctor at [facility].  He had x-rays are negative for fracture.  He was told he had a pulled muscle and told to follow-up here for sports medicine.  Today he says the shoulder still feels very stiff.  He is made worse by trying to sleep on it and doing overhead movements.  He has not been taking any thing for pain other than Tylenol.  Due to his other medical problems he cannot take NSAIDs.  Denies any numbness or tingling of the arm.  The shoulder off and on has been cracking and popping and stiff for many years but it has never given him issues as acutely like this.  ROS:     See HPI  PERTINENT  PMH / PSH FH / / SH:  Past Medical, Surgical, Social, and Family History Reviewed & Updated in the EMR.  Pertinent findings include:  Type 2 diabetes  OBJECTIVE: BP 120/73   Ht '5\' 9"'$  (1.753 m)   Wt (!) 315 lb (142.9 kg)   BMI 46.52 kg/m   Physical Exam:  Vital signs are reviewed.  GEN: Alert and oriented, NAD Pulm: Breathing unlabored PSY: normal mood, congruent affect  MSK: Left shoulder -no obvious deformity.  Nontender to palpation at over the clavicle.  Mildly tender palpation over the [facility] joint.  Nontender to palpation over the biceps tendon in the bicipital groove.  Active range of motion abduction to 90 degrees, forward flexion to 90 degrees, external rotation to 45 degrees, internal rotation to level of lower lumbar spine.  Passive assisted abduction I can get him to about 130 degrees.  4/5 strength with resisted internal and external rotation.  4/5 strength with supraspinatus testing.  Has  a positive Hawkins.  Positive Neer test.  Positive empty can test.  Negative Speed test.  Neurovascularly intact distally  ULTRASOUND: ULTRASOUND: Shoulder, Left  Diagnostic limited ultrasound imaging obtained of patient's left shoulder.  - No obvious evidence of bony deformity or osteophyte development appreciated.  - Long head of the biceps tendon: No evidence of tendon thickening, calcification, subluxation, or tearing in short or long axis views. No edema or bullseye sign.  - Subscapularis tendon: complete visualization across the width of the insertion point yielded evidence of small calcifications with no obvious tears in the long axis view.  - Supraspinatus tendon: complete visualization across the width of the insertion point yielded evidence of hypoechoic changes with small articular sided tear with some increased vascularization in the long axis view. No evidence of bursal inflammation appreciated.  - Infraspinatus and teres minor tendons: visualization across the width of the insertion points yielded small mid substance tear in the long axis view.  [facility] Joint: No evidence of joint separation, collapse, or osteophyte development appreciated. No effusion present.  Impression: Small articular sided supraspinatus tear without retraction.  Small bursal sided infraspinatus tear without retraction.  Chronic calcification of the subscap.    ASSESSMENT & PLAN:  1.  Left shoulder rotator cuff tendinopathy  -No large tears of the rotator  cuff.  Suspect he has some degenerative changes that were irritated by his fall.  Since he cannot take NSAIDs, will do a subacromial bursa corticosteroid injection today.  Will also give him some rotator cuff rehab exercises to work on strengthening and range of motion. Follow-up in about 6 weeks.  If no improvement, we could discuss further imaging.  All questions answered and agrees to plan.  Procedure performed:  Left subacromial corticosteroid injection;  palpation guided  Consent obtained and verified. Time-out conducted. Noted no overlying erythema, induration, or other signs of local infection. The left posterior subacromial space was palpated and marked. The overlying skin was prepped in a sterile fashion. Topical analgesic spray: Ethyl chloride. Needle: 21 gauge, 1.5 inch Completed without difficulty. Meds: 4 cc 1% lidocaine without epinephrine, 40 mg depo-medrol   Advised to call if fevers/chills, erythema, induration, drainage, or persistent bleeding.   Dortha Kern, MD PGY-4, Sports Medicine Fellow [place]

## 2022-11-25 ENCOUNTER — Encounter: Payer: Self-pay | Admitting: Family Medicine

## 2022-11-25 NOTE — Progress Notes (Signed)
[facility]: Attending Note: I have examined the patient, reviewed the chart, discussed the assessment and plan with the Sports Medicine Fellow. I agree with assessment and treatment plan as detailed in the Fellow's note.  

## 2022-11-29 ENCOUNTER — Other Ambulatory Visit: Payer: Self-pay | Admitting: Orthopedic Surgery

## 2022-11-29 ENCOUNTER — Ambulatory Visit (INDEPENDENT_AMBULATORY_CARE_PROVIDER_SITE_OTHER): Payer: Medicare Other | Admitting: Family Medicine

## 2022-11-29 VITALS — BP 128/80 | Ht 69.0 in | Wt 315.0 lb

## 2022-11-29 DIAGNOSIS — M25512 Pain in left shoulder: Secondary | ICD-10-CM | POA: Diagnosis present

## 2022-11-29 DIAGNOSIS — M75102 Unspecified rotator cuff tear or rupture of left shoulder, not specified as traumatic: Secondary | ICD-10-CM

## 2022-11-29 NOTE — Assessment & Plan Note (Signed)
Do not think he is going to get much relief from a glenohumeral joint injection we discussed gust that today.  I think to get him the quickest recovery would be to send him to orthopedics.  They will actually be able to see him today so we have scheduled him with them.  He can follow-up with [place] as needed.

## 2022-11-29 NOTE — Patient Instructions (Addendum)
Dr Carter Kitten [address] [place] [phone] today 11/29/22 [place]

## 2022-11-29 NOTE — Progress Notes (Signed)
  [name] - 51 y.o. 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  We tried a subacromial injection which did not seem to help at all.  In fact, he says it actually made it worse.  He has twin 35-year-old at home when really wants to get his shoulder working so he can help out more around the house.  His shoulder is really bothering him keeping him from doing things with the children that he feels he needs to.   OBJECTIVE: BP 128/80   Ht '5\' 9"'$  (1.753 m)   Wt (!) 315 lb (142.9 kg)   BMI 46.52 kg/m   Physical Exam:  Vital signs are reviewed. GENERAL: Well-developed male no acute distress SHOULDER: Left shoulder pain with abduction and forward flexion above 90 degrees.  No tenderness to palpation of the bicep tendon.   [place] shoulder last week:Supraspinatus tendon: complete visualization across the width of the insertion point yielded evidence of hypoechoic changes with small articular sided tear with some increased vascularization in the long axis view. No evidence of bursal inflammation appreciated.  - Infraspinatus and teres minor tendons: visualization across the width of the insertion points yielded small mid substance tear in the long axis view.  ASSESSMENT & PLAN:  See problem based charting & AVS for pt instructions. Pain in joint of left shoulder Do not think he is going to get much relief from a glenohumeral joint injection we discussed gust that today.  I think to get him the quickest recovery would be to send him to orthopedics.  They will actually be able to see him today so we have scheduled him with them.  He can follow-up with [place] as needed.

## 2022-12-02 ENCOUNTER — Other Ambulatory Visit: Payer: Self-pay

## 2022-12-02 DIAGNOSIS — E291 Testicular hypofunction: Secondary | ICD-10-CM

## 2022-12-02 DIAGNOSIS — E349 Endocrine disorder, unspecified: Secondary | ICD-10-CM

## 2022-12-03 ENCOUNTER — Other Ambulatory Visit: Payer: Medicare Other

## 2022-12-03 ENCOUNTER — Other Ambulatory Visit: Payer: Self-pay

## 2022-12-03 DIAGNOSIS — E291 Testicular hypofunction: Secondary | ICD-10-CM

## 2022-12-03 DIAGNOSIS — E039 Hypothyroidism, unspecified: Secondary | ICD-10-CM

## 2022-12-03 DIAGNOSIS — E349 Endocrine disorder, unspecified: Secondary | ICD-10-CM

## 2022-12-04 ENCOUNTER — Telehealth: Payer: Self-pay

## 2022-12-04 LAB — HEMOGLOBIN AND HEMATOCRIT, BLOOD
Hematocrit: 43.6 % (ref 37.5–51.0)
Hemoglobin: 13.8 g/dL (ref 13.0–17.7)

## 2022-12-04 LAB — TESTOSTERONE: Testosterone: 478 ng/dL (ref 264–916)

## 2022-12-04 LAB — TSH: TSH: 10.21 mIU/L — ABNORMAL HIGH (ref 0.40–4.50)

## 2022-12-04 NOTE — Telephone Encounter (Signed)
Notified pts wife (ok per DPR) of results. 3 mo lab appt and OV booked according to pts injection schedule.

## 2022-12-04 NOTE — Telephone Encounter (Signed)
-----   Message from Nori Riis, PA-C sent at 12/04/2022  8:15 AM EST ----- Please let Mr. Cheatwood know that his blood work looks good.  I recommend that he continue his present dose of testosterone cypionate and I need to see him in three months with testosterone (one week after injection), PSA, H&H, SHIM, I PSS and exam.

## 2022-12-06 ENCOUNTER — Other Ambulatory Visit: Payer: Self-pay | Admitting: Urology

## 2022-12-06 ENCOUNTER — Other Ambulatory Visit: Payer: Self-pay | Admitting: Family Medicine

## 2022-12-06 DIAGNOSIS — F419 Anxiety disorder, unspecified: Secondary | ICD-10-CM

## 2022-12-06 DIAGNOSIS — R252 Cramp and spasm: Secondary | ICD-10-CM

## 2022-12-06 DIAGNOSIS — F341 Dysthymic disorder: Secondary | ICD-10-CM

## 2022-12-06 DIAGNOSIS — E785 Hyperlipidemia, unspecified: Secondary | ICD-10-CM

## 2022-12-06 DIAGNOSIS — E669 Obesity, unspecified: Secondary | ICD-10-CM

## 2022-12-09 ENCOUNTER — Other Ambulatory Visit: Payer: Self-pay

## 2022-12-09 ENCOUNTER — Other Ambulatory Visit: Payer: Self-pay | Admitting: Family Medicine

## 2022-12-09 DIAGNOSIS — E669 Obesity, unspecified: Secondary | ICD-10-CM

## 2022-12-09 DIAGNOSIS — E039 Hypothyroidism, unspecified: Secondary | ICD-10-CM

## 2022-12-09 DIAGNOSIS — E785 Hyperlipidemia, unspecified: Secondary | ICD-10-CM

## 2022-12-09 MED ORDER — METFORMIN HCL ER 750 MG PO TB24: 750.0000 mg | ORAL_TABLET | Freq: Every day | ORAL | 1 refills | Status: DC

## 2022-12-09 MED ORDER — ROSUVASTATIN CALCIUM 5 MG PO TABS: 5.0000 mg | ORAL_TABLET | Freq: Every day | ORAL | 1 refills | Status: DC

## 2022-12-09 MED ORDER — LEVOTHYROXINE SODIUM 75 MCG PO TABS: 75.0000 ug | ORAL_TABLET | Freq: Every day | ORAL | 1 refills | Status: DC

## 2022-12-11 NOTE — Progress Notes (Unsigned)
Name: [name]   MRN: [id number]    DOB: Apr 30, 1971   Date:12/12/2022       Progress Note  Subjective  Chief Complaint  Follow Up  HPI  Anxiety/Depression :doing better since last visit, he states as long as both of them ( wife and himself) are compliant with medications they get along well. 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 He denies polyphagia, polydipsia or polyuria.  Eye exam is up to date. BP is borderline today at 132/84 but usually at goal. . He is on Crestor daily and tolerating it well , LDL slightly above goal, but TSH was also high therefore we will continue current dose    Muscle Cramps: he was doing well, he is now on crestor and seems to tolerate it better. Taking flexeril as needed   Hypothyroidism: taking same dose of Synthroid for many months,however it went very low after that very high, we adjusted the dose and we will recheck levels in 4-6 weeks. He has chronic dry skin, no hair loss , no change in bowel movement or palpitation     Chronic low back pain : he was Norco 7.5/325mg twice daily for years however now has medicare and insurance no covering medication, he has been out for the past month. Pain is about 5 /10 , stable on Lyrica and Flexeril, discussed trying otc Tylenol 500 mg max of 3 g per day to keep below toxic levels.    Neuropathy: he has bilateral daily pain on both legs, secondary to chronic back problems, aching like, he denies burning or prickly sensation, Lyrica helps. Pain level is up on left  foot and is back on disability, Pain on foot also 3/10.   History of bariatric surgery/Morbid obesity : surgery 09/2012 was he is morbidly obese his weight at the time was 396 lbs - lowest weight after surgery was 275 lbs, he gradually gained weight back, it stabilized at the 340's, it was in the mid 330's for a while, he is now on Rybelsus , started Dec 2022 and had lost down to 306 lbs but today is up to 327 lbs, we will switch from Rybelsus to [facility]    HTN: he is taking Diovan 320 mg daily, no chest pain, dizziness  or palpitation . BP is borderline   Controlled gout; taking allopurinol , no recent episodes.  Left shoulder pain: seeing Ortho, and will have MRI today, pain level is very high 9/10 . It started after he fell this Fall    Patient Active Problem List   Diagnosis Date Noted   Pain in joint of left shoulder 11/29/2022   Anemia, unspecified 06/12/2016   B12 deficiency 06/11/2016   Allergic conjunctivitis 06/11/2016   Muscle cramps 06/11/2016   Hypertension, benign 11/03/2015   Dyslipidemia 11/02/2015   Acquired nystagmus 08/01/2015   Chronic bilateral low back pain with bilateral sciatica 06/29/2015   Carpal tunnel syndrome 06/29/2015   Osteoarthritis 06/29/2015   Claustrophobia 06/29/2015  Foot drop, left 06/29/2015   Hiatal hernia 06/29/2015   H/O diabetes mellitus 06/29/2015   H/O testicular cancer 06/29/2015   Acquired hypothyroidism 40/07/6760   Dysmetabolic syndrome 95/08/3266   Morbid obesity ([place]) 06/29/2015   Allergic rhinitis 06/29/2015   Neuropathy 06/29/2015   Seborrheic keratoses 06/29/2015   Vitamin D deficiency 06/29/2015   Bariatric surgery status 02/20/2012    Past Surgical History:  Procedure Laterality Date   BACK SURGERY     X 2   HERNIA REPAIR     left side and umbilical hernia repair   ROUX-EN-Y GASTRIC BYPASS  2.28.13   URETEROTOMY Left 2000    Family History  Problem Relation Age of Onset   Coronary artery disease Mother     Cancer Father        Melanoma and Testicular   Diabetes Father     Social History   Tobacco Use   Smoking status: Former    Packs/day: 3.00    Years: 8.00    Total pack years: 24.00    Types: Cigarettes, Cigars, Pipe    Start date: 06/28/1984    Quit date: 12/24/1991    Years since quitting: 30.9   Smokeless tobacco: Former    Types: Chew    Quit date: 12/24/1991  Substance Use Topics   Alcohol use: No    Alcohol/week: 0.0 standard drinks of alcohol     Current Outpatient Medications:    ALLERGY RELIEF 180 MG tablet, TAKE 1 TABLET(180 MG) BY MOUTH DAILY, Disp: 90 tablet, Rfl: 0   allopurinol (ZYLOPRIM) 100 MG tablet, Take 1 tablet (100 mg total) by mouth 2 (two) times daily., Disp: 180 tablet, Rfl: 1   benzonatate (TESSALON) 100 MG capsule, Take 1-2 capsules (100-200 mg total) by mouth 2 (two) times daily as needed., Disp: 40 capsule, Rfl: 0   blood glucose meter kit and supplies, Dispense based on patient and insurance preference. Use up to four times daily as directed. (FOR ICD-10 E10.9, E11.9)., Disp: 1 each, Rfl: 0   busPIRone (BUSPAR) 5 MG tablet, TAKE 1 TO 2 TABLETS BY MOUTH THREE TIMES DAILY, Disp: 180 tablet, Rfl: 0   cyclobenzaprine (FLEXERIL) 10 MG tablet, Take 1 tablet (10 mg total) by mouth at bedtime. TAKE 1 TABLET BY MOUTH AT BEDTIME, Disp: 90 tablet, Rfl: 1   DULoxetine (CYMBALTA) 60 MG capsule, Take 1 capsule (60 mg total) by mouth daily., Disp: 90 capsule, Rfl: 1   fluticasone (FLONASE) 50 MCG/ACT nasal spray, Place 2 sprays into both nostrils daily., Disp: 16 g, Rfl: 6   levothyroxine (SYNTHROID) 75 MCG tablet, Take 1 tablet (75 mcg total) by mouth daily., Disp: 30 tablet, Rfl: 1   Magnesium Oxide 500 MG TABS, Take 1 tablet by mouth once daily, Disp: 90 tablet, Rfl: 0   metFORMIN (GLUCOPHAGE-XR) 750 MG 24 hr tablet, Take 1 tablet (750 mg total) by mouth daily with breakfast., Disp: 90 tablet, Rfl: 1   pregabalin (LYRICA) 225 MG capsule, Take 1 capsule (225 mg total)  by mouth 2 (two) times daily., Disp: 180 capsule, Rfl: 1   QUEtiapine (SEROQUEL) 25 MG tablet, Take 1 tablet (25 mg total) by mouth at bedtime., Disp: 90 tablet, Rfl: 1   rosuvastatin (CRESTOR) 5 MG tablet, Take 1 tablet (5 mg total) by mouth daily., Disp: 90 tablet, Rfl: 1   testosterone cypionate (DEPOTESTOSTERONE CYPIONATE) 200 MG/ML injection, ADMINISTER 1 ML(200 MG) IN THE MUSCLE EVERY 14 DAYS, Disp: 6 mL, Rfl: 0   tirzepatide [facility])  10 MG/0.5ML Pen, Inject 10 mg into the skin once a week., Disp: 6 mL, Rfl: 0   valsartan (DIOVAN) 320 MG tablet, Take 1 tablet (320 mg total) by mouth daily., Disp: 90 tablet, Rfl: 1   traZODone (DESYREL) 100 MG tablet, TAKE 1 & 1/2 (ONE & ONE-HALF) TABLETS BY MOUTH AT BEDTIME, Disp: 135 tablet, Rfl: 1  Current Facility-Administered Medications:    cyanocobalamin (VITAMIN B12) injection 1,000 mcg, 1,000 mcg, Intramuscular, Once, [name], MD  Allergies  Allergen Reactions   Morphine Itching    I personally reviewed active problem list, medication list, allergies, family history, social history, health maintenance with the patient/caregiver today.   ROS  Ten systems reviewed and is negative except as mentioned in HPI   Objective  Vitals:   12/12/22 0748  BP: 132/84  Pulse: 64  Resp: 16  SpO2: 96%  Weight: (!) 327 lb (148.3 kg)  Height: _0  (1.803 m)    Body mass index is 45.61 kg/m.  Physical Exam  Constitutional: Patient appears well-developed and well-nourished. Obese  No distress.  HEENT: head atraumatic, normocephalic, pupils equal and reactive to light, neck supple Cardiovascular: Normal rate, regular rhythm and normal heart sounds.  No murmur heard. No BLE edema. Pulmonary/Chest: Effort normal and breath sounds normal. No respiratory distress. Abdominal: Soft.  There is no tenderness. Muscular skeletal: decrease rom of left shoulder, no pain during palpation of lumbar spine , negative straight leg raise  Psychiatric:  Patient has a normal mood and affect. behavior is normal. Judgment and thought content normal.   Recent Results (from the past 2160 hour(s))  HM DIABETES EYE EXAM     Status: None   Collection Time: 10/08/22 12:00 AM  Result Value Ref Range   HM Diabetic Eye Exam No Retinopathy No Retinopathy  Urinalysis, Complete     Status: None   Collection Time: 10/10/22  2:23 PM  Result Value Ref Range   Specific Gravity, UA 1.015 1.005 - 1.030   pH, UA 5.0 5.0 - 7.5   Color, UA Yellow Yellow   Appearance Ur Clear Clear   Leukocytes,UA Negative Negative   Protein,UA Negative Negative/Trace   Glucose, UA Negative Negative   Ketones, UA Negative Negative   RBC, UA Negative Negative   Bilirubin, UA Negative Negative   Urobilinogen, Ur 0.2 0.2 - 1.0 mg/dL   Nitrite, UA Negative Negative   Microscopic Examination See below:   Microscopic Examination     Status: None   Collection Time: 10/10/22  2:23 PM   Urine  Result Value Ref Range   WBC, UA 0-5 0 - 5 /hpf   RBC, Urine 0-2 0 - 2 /hpf   Epithelial Cells (non renal) None seen 0 - 10 /hpf   Bacteria, UA None seen None seen/Few  PSA     Status: None   Collection Time: 10/15/22  8:50 AM  Result Value Ref Range   Prostate Specific Ag, Serum 0.5 0.0 - 4.0 ng/mL    Comment: Roche ECLIA methodology. According to the American Urological Association, Serum PSA should decrease and remain at undetectable levels after radical prostatectomy. The AUA defines biochemical recurrence as an initial PSA value 0.2 ng/mL or greater followed by a subsequent confirmatory PSA value 0.2 ng/mL or greater. Values obtained with different assay methods or kits cannot be used interchangeably. Results cannot be interpreted as absolute evidence of the presence or absence of malignant disease.   Luteinizing hormone     Status: Abnormal  Collection Time: 10/15/22  8:50 AM  Result Value Ref Range   LH 32.1 (H) 1.7 - 8.6 mIU/mL  Testosterone     Status: Abnormal    Collection Time: 10/15/22  8:50 AM  Result Value Ref Range   Testosterone 152 (L) 264 - 916 ng/dL    Comment: Adult male reference interval is based on a population of healthy nonobese males (BMI <30) between 23 and 73 years old. [place], [place] [phone]. PMID: 35701779.   Testosterone     Status: None   Collection Time: 10/22/22  8:48 AM  Result Value Ref Range   Testosterone 443 264 - 916 ng/dL    Comment: Adult male reference interval is based on a population of healthy nonobese males (BMI <30) between 90 and 27 years old. [place], [place] [phone]. PMID: [id number].   Hemoglobin and hematocrit, blood     Status: None   Collection Time: 12/03/22  8:52 AM  Result Value Ref Range   Hemoglobin 13.8 13.0 - 17.7 g/dL   Hematocrit 43.6 37.5 - 51.0 %  Testosterone     Status: None   Collection Time: 12/03/22  8:52 AM  Result Value Ref Range   Testosterone 478 264 - 916 ng/dL    Comment: Adult male reference interval is based on a population of healthy nonobese males (BMI <30) between 81 and 22 years old. [place], [place] [phone]. PMID: [id number].   TSH     Status: Abnormal   Collection Time: 12/03/22  9:14 AM  Result Value Ref Range   TSH 10.21 (H) 0.40 - 4.50 mIU/L  POCT HgB A1C     Status: Abnormal   Collection Time: 12/12/22  7:54 AM  Result Value Ref Range   Hemoglobin A1C 7.2 (A) 4.0 - 5.6 %   HbA1c POC (<> result, manual entry)     HbA1c, POC (prediabetic range)     HbA1c, POC (controlled diabetic range)      PHQ2/9:    12/12/2022    7:49 AM 11/12/2022    2:48 PM 09/12/2022    8:22 AM 06/11/2022    2:51 PM 03/04/2022    2:24 PM  Depression screen PHQ 2/9  Decreased Interest 0 0 0 0 0  Down, Depressed, Hopeless 0 0 0 0 0  PHQ - 2 Score 0 0 0 0 0  Altered sleeping 0 0 0 3 0  Tired, decreased energy 0 0 0 0 0  Change in appetite 0 0 0 0 0  Feeling bad or failure about yourself  0 0 0 0 0  Trouble concentrating 0 0 0 0 0   Moving slowly or fidgety/restless 0 0 0 0 0  Suicidal thoughts 0 0 0 0 0  PHQ-9 Score 0 0 0 3 0    phq 9 is negative   Fall Risk:    12/12/2022    7:49 AM 11/12/2022    2:48 PM 09/12/2022    8:22 AM 06/11/2022    2:51 PM 03/04/2022    2:24 PM  Fall Risk   Falls in the past year? 1 1 0 1 0  Number falls in past yr: 0 0 0 0 0  Injury with Fall? 1 1 0 0 0  Risk for fall due to : _0   Follow up _1   Functional Status Survey: Is the patient deaf or have difficulty hearing?: No Does the patient have difficulty seeing, even when wearing glasses/contacts?: No Does the patient have difficulty concentrating, remembering, or making decisions?: No Does the patient have difficulty walking or climbing stairs?: Yes Does the patient have difficulty dressing or bathing?: No Does the patient have difficulty doing errands alone such as visiting a doctor's office or shopping?: No    Assessment & Plan  1. Hypertension associated with type 2 diabetes mellitus (HCC)  - POCT HgB A1C - tirzepatide (MOUNJARO) 10 MG/0.5ML Pen; Inject 10 mg into the skin once a week.  Dispense: 6 mL; Refill: 0  2. Other insomnia  - traZODone (DESYREL) 100 MG tablet; TAKE 1 & 1/2 (ONE & ONE-HALF) TABLETS BY MOUTH AT BEDTIME  Dispense: 135 tablet; Refill: 1  3. B12 deficiency  - cyanocobalamin (VITAMIN B12) injection 1,000 mcg  4. Morbid obesity ([place])  Discussed with the patient the risk posed by an increased BMI. Discussed importance of portion control, calorie counting and at least 150 minutes of physical activity weekly. Avoid sweet beverages and drink more water. Eat at least 6 servings of fruit and vegetables daily    5. Acquired hypothyroidism  Recheck level in one month   6. Chronic bilateral low back  pain with bilateral sciatica  Stable  7. Vitamin D deficiency  Continue supplementation   8. Controlled gout   9. Bariatric surgery status

## 2022-12-12 ENCOUNTER — Encounter: Payer: Self-pay | Admitting: Family Medicine

## 2022-12-12 ENCOUNTER — Ambulatory Visit
Admission: RE | Admit: 2022-12-12 | Discharge: 2022-12-12 | Disposition: A | Discharge status: Home / Self Care | Payer: Medicare Other | Source: Ambulatory Visit | Attending: Orthopedic Surgery | Admitting: Orthopedic Surgery

## 2022-12-12 ENCOUNTER — Ambulatory Visit (INDEPENDENT_AMBULATORY_CARE_PROVIDER_SITE_OTHER): Payer: Medicare Other | Admitting: Family Medicine

## 2022-12-12 VITALS — BP 132/84 | HR 64 | Resp 16 | Ht 71.0 in | Wt 327.0 lb

## 2022-12-12 DIAGNOSIS — M109 Gout, unspecified: Secondary | ICD-10-CM

## 2022-12-12 DIAGNOSIS — M5441 Lumbago with sciatica, right side: Secondary | ICD-10-CM

## 2022-12-12 DIAGNOSIS — I152 Hypertension secondary to endocrine disorders: Secondary | ICD-10-CM

## 2022-12-12 DIAGNOSIS — E538 Deficiency of other specified B group vitamins: Secondary | ICD-10-CM

## 2022-12-12 DIAGNOSIS — E1159 Type 2 diabetes mellitus with other circulatory complications: Secondary | ICD-10-CM

## 2022-12-12 DIAGNOSIS — E039 Hypothyroidism, unspecified: Secondary | ICD-10-CM

## 2022-12-12 DIAGNOSIS — G4709 Other insomnia: Secondary | ICD-10-CM

## 2022-12-12 DIAGNOSIS — E559 Vitamin D deficiency, unspecified: Secondary | ICD-10-CM

## 2022-12-12 DIAGNOSIS — M75102 Unspecified rotator cuff tear or rupture of left shoulder, not specified as traumatic: Secondary | ICD-10-CM

## 2022-12-12 DIAGNOSIS — M5442 Lumbago with sciatica, left side: Secondary | ICD-10-CM

## 2022-12-12 DIAGNOSIS — Z9884 Bariatric surgery status: Secondary | ICD-10-CM

## 2022-12-12 DIAGNOSIS — G8929 Other chronic pain: Secondary | ICD-10-CM

## 2022-12-12 LAB — POCT GLYCOSYLATED HEMOGLOBIN (HGB A1C): Hemoglobin A1C: 7.2 % — AB (ref 4.0–5.6)

## 2022-12-12 MED ORDER — CYANOCOBALAMIN 1000 MCG/ML IJ SOLN
1000.0000 ug | Freq: Once | INTRAMUSCULAR | Status: AC
  Administered 2022-12-12: 1000 ug via INTRAMUSCULAR

## 2022-12-12 MED ORDER — TRAZODONE HCL 100 MG PO TABS: ORAL_TABLET | ORAL | 1 refills | Status: DC

## 2022-12-12 MED ORDER — TIRZEPATIDE 10 MG/0.5ML ~~LOC~~ SOAJ: 10.0000 mg | SUBCUTANEOUS | 0 refills | Status: DC

## 2022-12-27 ENCOUNTER — Other Ambulatory Visit: Payer: Self-pay | Admitting: Family Medicine

## 2023-01-02 HISTORY — PX: SHOULDER ARTHROSCOPY W/ ROTATOR CUFF REPAIR: SHX2400

## 2023-01-04 ENCOUNTER — Other Ambulatory Visit: Payer: Self-pay

## 2023-01-04 ENCOUNTER — Emergency Department: Payer: Medicare Other

## 2023-01-04 ENCOUNTER — Encounter: Payer: Self-pay | Admitting: Family Medicine

## 2023-01-04 ENCOUNTER — Inpatient Hospital Stay
Admission: EM | Admit: 2023-01-04 | Discharge: 2023-01-06 | DRG: 177 | Disposition: A | Discharge status: Home / Self Care | Payer: Medicare Other | Attending: Internal Medicine | Admitting: Internal Medicine

## 2023-01-04 DIAGNOSIS — J449 Chronic obstructive pulmonary disease, unspecified: Secondary | ICD-10-CM | POA: Diagnosis present

## 2023-01-04 DIAGNOSIS — Z885 Allergy status to narcotic agent status: Secondary | ICD-10-CM

## 2023-01-04 DIAGNOSIS — F17200 Nicotine dependence, unspecified, uncomplicated: Secondary | ICD-10-CM | POA: Diagnosis present

## 2023-01-04 DIAGNOSIS — M109 Gout, unspecified: Secondary | ICD-10-CM | POA: Diagnosis present

## 2023-01-04 DIAGNOSIS — F17210 Nicotine dependence, cigarettes, uncomplicated: Secondary | ICD-10-CM | POA: Diagnosis present

## 2023-01-04 DIAGNOSIS — Z79899 Other long term (current) drug therapy: Secondary | ICD-10-CM | POA: Diagnosis not present

## 2023-01-04 DIAGNOSIS — Z9884 Bariatric surgery status: Secondary | ICD-10-CM | POA: Diagnosis not present

## 2023-01-04 DIAGNOSIS — J189 Pneumonia, unspecified organism: Secondary | ICD-10-CM

## 2023-01-04 DIAGNOSIS — E785 Hyperlipidemia, unspecified: Secondary | ICD-10-CM | POA: Diagnosis present

## 2023-01-04 DIAGNOSIS — G4733 Obstructive sleep apnea (adult) (pediatric): Secondary | ICD-10-CM | POA: Diagnosis present

## 2023-01-04 DIAGNOSIS — E669 Obesity, unspecified: Secondary | ICD-10-CM | POA: Diagnosis present

## 2023-01-04 DIAGNOSIS — F32A Depression, unspecified: Secondary | ICD-10-CM | POA: Diagnosis present

## 2023-01-04 DIAGNOSIS — J9601 Acute respiratory failure with hypoxia: Secondary | ICD-10-CM | POA: Diagnosis not present

## 2023-01-04 DIAGNOSIS — Z8249 Family history of ischemic heart disease and other diseases of the circulatory system: Secondary | ICD-10-CM | POA: Diagnosis not present

## 2023-01-04 DIAGNOSIS — Z833 Family history of diabetes mellitus: Secondary | ICD-10-CM

## 2023-01-04 DIAGNOSIS — E039 Hypothyroidism, unspecified: Secondary | ICD-10-CM | POA: Diagnosis present

## 2023-01-04 DIAGNOSIS — E1142 Type 2 diabetes mellitus with diabetic polyneuropathy: Secondary | ICD-10-CM | POA: Insufficient documentation

## 2023-01-04 DIAGNOSIS — J690 Pneumonitis due to inhalation of food and vomit: Secondary | ICD-10-CM | POA: Diagnosis present

## 2023-01-04 DIAGNOSIS — I10 Essential (primary) hypertension: Secondary | ICD-10-CM | POA: Insufficient documentation

## 2023-01-04 DIAGNOSIS — Z808 Family history of malignant neoplasm of other organs or systems: Secondary | ICD-10-CM | POA: Diagnosis not present

## 2023-01-04 DIAGNOSIS — Z1152 Encounter for screening for COVID-19: Secondary | ICD-10-CM | POA: Diagnosis not present

## 2023-01-04 DIAGNOSIS — Z79890 Hormone replacement therapy (postmenopausal): Secondary | ICD-10-CM

## 2023-01-04 DIAGNOSIS — F419 Anxiety disorder, unspecified: Secondary | ICD-10-CM | POA: Insufficient documentation

## 2023-01-04 DIAGNOSIS — R42 Dizziness and giddiness: Secondary | ICD-10-CM | POA: Diagnosis present

## 2023-01-04 DIAGNOSIS — T4145XA Adverse effect of unspecified anesthetic, initial encounter: Secondary | ICD-10-CM | POA: Diagnosis present

## 2023-01-04 DIAGNOSIS — Z6841 Body Mass Index (BMI) 40.0 and over, adult: Secondary | ICD-10-CM

## 2023-01-04 DIAGNOSIS — R0602 Shortness of breath: Principal | ICD-10-CM

## 2023-01-04 HISTORY — DX: Type 2 diabetes mellitus without complications: E11.9

## 2023-01-04 HISTORY — DX: Malignant neoplasm of unspecified testis, unspecified whether descended or undescended: C62.90

## 2023-01-04 HISTORY — DX: Hyperlipidemia, unspecified: E78.5

## 2023-01-04 HISTORY — DX: Essential (primary) hypertension: I10

## 2023-01-04 LAB — HEPATIC FUNCTION PANEL
ALT: 10 U/L (ref 0–44)
AST: 19 U/L (ref 15–41)
Albumin: 3.6 g/dL (ref 3.5–5.0)
Alkaline Phosphatase: 76 U/L (ref 38–126)
Bilirubin, Direct: 0.1 mg/dL (ref 0.0–0.2)
Indirect Bilirubin: 0.6 mg/dL (ref 0.3–0.9)
Total Bilirubin: 0.7 mg/dL (ref 0.3–1.2)
Total Protein: 6.2 g/dL — ABNORMAL LOW (ref 6.5–8.1)

## 2023-01-04 LAB — BASIC METABOLIC PANEL
Anion gap: 8 (ref 5–15)
BUN: 14 mg/dL (ref 6–20)
CO2: 25 mmol/L (ref 22–32)
Calcium: 8.4 mg/dL — ABNORMAL LOW (ref 8.9–10.3)
Chloride: 105 mmol/L (ref 98–111)
Creatinine, Ser: 1.33 mg/dL — ABNORMAL HIGH (ref 0.61–1.24)
GFR, Estimated: >60 mL/min (ref >60)
Glucose, Bld: 269 mg/dL — ABNORMAL HIGH (ref 70–99)
Potassium: 4.4 mmol/L (ref 3.5–5.1)
Sodium: 138 mmol/L (ref 135–145)

## 2023-01-04 LAB — TROPONIN I (HIGH SENSITIVITY)
Troponin I (High Sensitivity): 8 ng/L (ref <18)
Troponin I (High Sensitivity): 8 ng/L (ref <18)

## 2023-01-04 LAB — CBC WITH DIFFERENTIAL/PLATELET
Abs Immature Granulocytes: 0.02 10*3/uL (ref 0.00–0.07)
Basophils Absolute: 0.1 10*3/uL (ref 0.0–0.1)
Basophils Relative: 1 %
Eosinophils Absolute: 0.2 10*3/uL (ref 0.0–0.5)
Eosinophils Relative: 2 %
HCT: 42.6 % (ref 39.0–52.0)
Hemoglobin: 13.4 g/dL (ref 13.0–17.0)
Immature Granulocytes: 0 %
Lymphocytes Relative: 8 %
Lymphs Abs: 0.8 10*3/uL (ref 0.7–4.0)
MCH: 28.8 pg (ref 26.0–34.0)
MCHC: 31.5 g/dL (ref 30.0–36.0)
MCV: 91.6 fL (ref 80.0–100.0)
Monocytes Absolute: 0.7 10*3/uL (ref 0.1–1.0)
Monocytes Relative: 7 %
Neutro Abs: 8.4 10*3/uL — ABNORMAL HIGH (ref 1.7–7.7)
Neutrophils Relative %: 82 %
Platelets: 203 10*3/uL (ref 150–400)
RBC: 4.65 MIL/uL (ref 4.22–5.81)
RDW: 13.9 % (ref 11.5–15.5)
WBC: 10.1 10*3/uL (ref 4.0–10.5)
nRBC: 0 % (ref 0.0–0.2)

## 2023-01-04 LAB — RESP PANEL BY RT-PCR (RSV, FLU A&B, COVID)  RVPGX2
Influenza A by PCR: NEGATIVE
Influenza B by PCR: NEGATIVE
Resp Syncytial Virus by PCR: NEGATIVE
SARS Coronavirus 2 by RT PCR: NEGATIVE

## 2023-01-04 LAB — BRAIN NATRIURETIC PEPTIDE: B Natriuretic Peptide: 17.3 pg/mL (ref 0.0–100.0)

## 2023-01-04 MED ORDER — QUETIAPINE FUMARATE 25 MG PO TABS
25.0000 mg | ORAL_TABLET | Freq: Every day | ORAL | Status: DC
  Administered 2023-01-04 – 2023-01-05 (×2): 25 mg via ORAL
  Filled 2023-01-04 (×2): qty 1

## 2023-01-04 MED ORDER — ENOXAPARIN SODIUM 80 MG/0.8ML IJ SOSY
0.5000 mg/kg | PREFILLED_SYRINGE | INTRAMUSCULAR | Status: DC
  Administered 2023-01-05: 75 mg via SUBCUTANEOUS
  Filled 2023-01-04 (×2): qty 0.75

## 2023-01-04 MED ORDER — ALLOPURINOL 100 MG PO TABS
100.0000 mg | ORAL_TABLET | Freq: Two times a day (BID) | ORAL | Status: DC
  Administered 2023-01-05 – 2023-01-06 (×3): 100 mg via ORAL
  Filled 2023-01-04 (×3): qty 1

## 2023-01-04 MED ORDER — SODIUM CHLORIDE 0.9 % IV SOLN
500.0000 mg | INTRAVENOUS | Status: DC
  Administered 2023-01-04: 500 mg via INTRAVENOUS
  Filled 2023-01-04: qty 5

## 2023-01-04 MED ORDER — ONDANSETRON HCL 4 MG PO TABS: 4.0000 mg | ORAL_TABLET | Freq: Four times a day (QID) | ORAL | Status: DC | PRN

## 2023-01-04 MED ORDER — BUSPIRONE HCL 10 MG PO TABS
5.0000 mg | ORAL_TABLET | Freq: Three times a day (TID) | ORAL | Status: DC
  Administered 2023-01-04 – 2023-01-06 (×5): 5 mg via ORAL
  Filled 2023-01-04 (×5): qty 1

## 2023-01-04 MED ORDER — BACLOFEN 10 MG PO TABS
10.0000 mg | ORAL_TABLET | Freq: Three times a day (TID) | ORAL | Status: DC | PRN
  Administered 2023-01-04 – 2023-01-06 (×2): 10 mg via ORAL
  Filled 2023-01-04 (×2): qty 1

## 2023-01-04 MED ORDER — TRAZODONE HCL 50 MG PO TABS: 25.0000 mg | ORAL_TABLET | Freq: Every evening | ORAL | Status: DC | PRN

## 2023-01-04 MED ORDER — SODIUM CHLORIDE 0.9 % IV SOLN: INTRAVENOUS | Status: DC

## 2023-01-04 MED ORDER — LEVOTHYROXINE SODIUM 50 MCG PO TABS
75.0000 ug | ORAL_TABLET | Freq: Every day | ORAL | Status: DC
  Administered 2023-01-05 – 2023-01-06 (×2): 75 ug via ORAL
  Filled 2023-01-04 (×2): qty 2

## 2023-01-04 MED ORDER — ONDANSETRON HCL 4 MG/2ML IJ SOLN
4.0000 mg | Freq: Four times a day (QID) | INTRAMUSCULAR | Status: DC | PRN
  Administered 2023-01-04: 4 mg via INTRAVENOUS
  Filled 2023-01-04: qty 2

## 2023-01-04 MED ORDER — OXYCODONE HCL 5 MG PO TABS
5.0000 mg | ORAL_TABLET | ORAL | Status: DC | PRN
  Administered 2023-01-04 – 2023-01-06 (×5): 5 mg via ORAL
  Filled 2023-01-04 (×5): qty 1

## 2023-01-04 MED ORDER — ROSUVASTATIN CALCIUM 10 MG PO TABS
5.0000 mg | ORAL_TABLET | Freq: Every day | ORAL | Status: DC
  Administered 2023-01-05 – 2023-01-06 (×2): 5 mg via ORAL
  Filled 2023-01-04 (×2): qty 1

## 2023-01-04 MED ORDER — ACETAMINOPHEN 325 MG PO TABS
650.0000 mg | ORAL_TABLET | Freq: Four times a day (QID) | ORAL | Status: DC | PRN
  Administered 2023-01-05: 650 mg via ORAL
  Filled 2023-01-04: qty 2

## 2023-01-04 MED ORDER — FLUTICASONE PROPIONATE 50 MCG/ACT NA SUSP: 2.0000 | Freq: Every day | NASAL | Status: DC

## 2023-01-04 MED ORDER — BENZONATATE 100 MG PO CAPS: 100.0000 mg | ORAL_CAPSULE | Freq: Two times a day (BID) | ORAL | Status: DC | PRN

## 2023-01-04 MED ORDER — PREGABALIN 75 MG PO CAPS
225.0000 mg | ORAL_CAPSULE | Freq: Two times a day (BID) | ORAL | Status: DC
  Administered 2023-01-04 – 2023-01-06 (×4): 225 mg via ORAL
  Filled 2023-01-04 (×4): qty 3

## 2023-01-04 MED ORDER — ACETAMINOPHEN 650 MG RE SUPP: 650.0000 mg | Freq: Four times a day (QID) | RECTAL | Status: DC | PRN

## 2023-01-04 MED ORDER — MAGNESIUM HYDROXIDE 400 MG/5ML PO SUSP: 30.0000 mL | Freq: Every day | ORAL | Status: DC | PRN

## 2023-01-04 MED ORDER — CYCLOBENZAPRINE HCL 10 MG PO TABS
10.0000 mg | ORAL_TABLET | Freq: Every day | ORAL | Status: DC
  Administered 2023-01-04 – 2023-01-05 (×2): 10 mg via ORAL
  Filled 2023-01-04 (×2): qty 1

## 2023-01-04 MED ORDER — IPRATROPIUM-ALBUTEROL 0.5-2.5 (3) MG/3ML IN SOLN
3.0000 mL | Freq: Four times a day (QID) | RESPIRATORY_TRACT | Status: DC
  Administered 2023-01-05: 3 mL via RESPIRATORY_TRACT
  Filled 2023-01-04: qty 3

## 2023-01-04 MED ORDER — SODIUM CHLORIDE 0.9 % IV SOLN
2.0000 g | INTRAVENOUS | Status: AC
  Administered 2023-01-04 – 2023-01-05 (×2): 2 g via INTRAVENOUS
  Filled 2023-01-04: qty 20
  Filled 2023-01-04: qty 2

## 2023-01-04 MED ORDER — TRAZODONE HCL 50 MG PO TABS
150.0000 mg | ORAL_TABLET | Freq: Every day | ORAL | Status: DC
  Administered 2023-01-04 – 2023-01-05 (×2): 150 mg via ORAL
  Filled 2023-01-04 (×2): qty 1

## 2023-01-04 MED ORDER — HYDROCOD POLI-CHLORPHE POLI ER 10-8 MG/5ML PO SUER: 5.0000 mL | Freq: Two times a day (BID) | ORAL | Status: DC | PRN

## 2023-01-04 MED ORDER — IPRATROPIUM-ALBUTEROL 0.5-2.5 (3) MG/3ML IN SOLN
3.0000 mL | Freq: Once | RESPIRATORY_TRACT | Status: AC
  Administered 2023-01-04: 3 mL via RESPIRATORY_TRACT
  Filled 2023-01-04: qty 3

## 2023-01-04 MED ORDER — IRBESARTAN 150 MG PO TABS
300.0000 mg | ORAL_TABLET | Freq: Every day | ORAL | Status: DC
  Administered 2023-01-05 – 2023-01-06 (×2): 300 mg via ORAL
  Filled 2023-01-04 (×2): qty 2

## 2023-01-04 MED ORDER — IOHEXOL 350 MG/ML SOLN
80.0000 mL | Freq: Once | INTRAVENOUS | Status: AC | PRN
  Administered 2023-01-04: 80 mL via INTRAVENOUS

## 2023-01-04 MED ORDER — DULOXETINE HCL 30 MG PO CPEP
60.0000 mg | ORAL_CAPSULE | Freq: Every day | ORAL | Status: DC
  Administered 2023-01-05 – 2023-01-06 (×2): 60 mg via ORAL
  Filled 2023-01-04 (×2): qty 2

## 2023-01-04 MED ORDER — GUAIFENESIN ER 600 MG PO TB12
600.0000 mg | ORAL_TABLET | Freq: Two times a day (BID) | ORAL | Status: DC
  Administered 2023-01-04 – 2023-01-06 (×4): 600 mg via ORAL
  Filled 2023-01-04 (×4): qty 1

## 2023-01-04 MED ORDER — SODIUM CHLORIDE 0.9 % IV SOLN: 2.0000 g | INTRAVENOUS | Status: DC

## 2023-01-04 MED ORDER — MAGNESIUM OXIDE -MG SUPPLEMENT 400 (240 MG) MG PO TABS
400.0000 mg | ORAL_TABLET | Freq: Every day | ORAL | Status: DC
  Administered 2023-01-05 – 2023-01-06 (×2): 400 mg via ORAL
  Filled 2023-01-04 (×2): qty 1

## 2023-01-04 MED ORDER — ONDANSETRON HCL 4 MG PO TABS: 4.0000 mg | ORAL_TABLET | Freq: Three times a day (TID) | ORAL | Status: DC | PRN

## 2023-01-04 MED ORDER — SODIUM CHLORIDE 0.9 % IV SOLN: 500.0000 mg | INTRAVENOUS | Status: DC

## 2023-01-04 MED ORDER — SENNOSIDES-DOCUSATE SODIUM 8.6-50 MG PO TABS
2.0000 | ORAL_TABLET | Freq: Every day | ORAL | Status: DC
  Administered 2023-01-06: 2 via ORAL
  Filled 2023-01-04 (×2): qty 2

## 2023-01-04 NOTE — Assessment & Plan Note (Addendum)
-  The patient be admitted to a medical telemetry bed. - This is left lower lobe aspiration pneumonia likely related to anesthesia. - We will continue antibiotic therapy with IV Rocephin and Zithromax. - We will add IV Flagyl to cover anaerobes. - We we will follow blood cultures. - Mucolytic therapy and bronchodilator therapy will be provided.

## 2023-01-04 NOTE — Consult Note (Signed)
CODE SEPSIS - PHARMACY COMMUNICATION  **Broad Spectrum Antibiotics should be administered within 1 hour of Sepsis diagnosis**  Time Code Sepsis Called/Page Received: 2042  Antibiotics Ordered: 2042  Time of 1st antibiotic administration: 2054  Additional action taken by pharmacy: n/a  If necessary, Name of Provider/Nurse Contacted: [place] ,PharmD Clinical Pharmacist  01/04/2023  9:00 PM

## 2023-01-04 NOTE — ED Notes (Signed)
NT [place] assisted pt on ambulatory O2 & pulse stat, as directed by Dr. Jari Pigg. Pt O2 was 97 at rest in bed lying down. NT assisted pt about with about 30-40 steps down the hallway and back to his room. Pt O2 was 94, pulse was 86 during the first 20 steps. On the last half of the walk back to the pt room, o2 dropped to 84, pt was visibly sob, NT asked if pt felt okay, pt stated that he felt the normal shortness of breath that he usually always feels. Pt returned to bed o2 went back up to 97. NT reported results to Dr. Jari Pigg.

## 2023-01-04 NOTE — Progress Notes (Signed)
PHARMACIST - PHYSICIAN COMMUNICATION  CONCERNING:  Enoxaparin (Lovenox) for DVT Prophylaxis    RECOMMENDATION: Patient was prescribed enoxaprin '40mg'$  q24 hours for VTE prophylaxis.   Filed Weights   01/04/23 2200  Weight: (!) 148.3 kg (327 lb)    Body mass index is 45.61 kg/m.  Estimated Creatinine Clearance: 98.2 mL/min (A) (by C-G formula based on SCr of 1.33 mg/dL (H)).   Based on [place] patient is candidate for enoxaparin 0.'5mg'$ /kg TBW SQ every 24 hours based on BMI being >30.  DESCRIPTION: Pharmacy has adjusted enoxaparin dose per [facility] policy.  Patient is now receiving enoxaparin 0.5 mg/kg every 24 hours   Renda Rolls, PharmD, [facility] 01/04/2023 10:06 PM

## 2023-01-04 NOTE — Assessment & Plan Note (Signed)
-  We will continue BuSpar and Cymbalta as well as Seroquel.

## 2023-01-04 NOTE — Assessment & Plan Note (Signed)
-  O2 protocol will be followed. - This is likely secondary to #1.

## 2023-01-04 NOTE — ED Triage Notes (Signed)
Patient presents with shortness of breath (no chest pain) that began yesterday; He also reports a single episode of dizziness last night; He recently had a rotator cuff repair on his LEFT shoulder; Denies cardiac / pulmonary medical history

## 2023-01-04 NOTE — Assessment & Plan Note (Signed)
-  We will continue his antihypertensives. 

## 2023-01-04 NOTE — H&P (Addendum)
[place]   PATIENT NAME: Yesenia Fontenette    MR#:  [id number]  DATE OF BIRTH:  1971-07-04  DATE OF ADMISSION:  01/04/2023  PRIMARY CARE PHYSICIAN: [name], MD   Patient is coming from: Home  REQUESTING/REFERRING PHYSICIAN: Marjean Donna, MD  CHIEF COMPLAINT:   Chief Complaint  Patient presents with   Shortness of Breath    Patient presents with shortness of breath (no chest pain) that began yesterday; He also reports a single episode of dizziness last night; He recently had a rotator cuff repair on his LEFT shoulder; Denies cardiac / pulmonary medical history    HISTORY OF PRESENT ILLNESS:  RENDER MARLEY is a 52 y.o. Caucasian male with medical history significant for hypothyroidism gout, OSA on CPAP, who recently underwent left rotator cuff surgery, who presents to the emergency room with acute onset of dyspnea since yesterday with dizziness that started last night.  He admitted to associated mild dry cough and wheezing.  He had tactile fever and chills this morning per his wife.  He reported dizziness yesterday without blurred vision or diplopia.  He denies any fever or chills or cough or wheezing.  No nausea or vomiting or abdominal pain.  No dysuria, urinary frequency or urgency hematuria or flank pain.  No nausea or vomiting or abdominal pain.  ED Course: When the patient went to the ER, BP was 148/105 with otherwise normal vital signs.  Pulse symmetry dropped to 85% on room air upon ambulation.  Labs revealed blood glucose of 269 and creatinine 1.33 with a calcium of 8.4.  CMP otherwise was within normal.  High-sensitivity troponin I was 8 and BNP 17.3.  CBC was within normal. EKG as reviewed by me : EKG showed normal sinus rhythm with a rate of 80. Imaging: Two-view chest x-ray showed probable small left pleural effusion with hazy left lung opacity that could represent pneumonia, aspiration or atelectasis.  Chest CTA PE revealed no evidence for PE and showed  heterogenously enhancing consolidative opacity within the left lower lobe and lingula that could reflect atelectasis or infection and aortic atherosclerosis.  Noncontrasted CT scan revealed no acute intracranial abnormality.  The patient was given DuoNebs, 2 g of IV Rocephin 500 mg of IV Zithromax.  He will be admitted to a medical telemetry bed for further evaluation and management.  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.13   URETEROTOMY Left 2000    SOCIAL HISTORY:   Social History   Tobacco Use   Smoking status: Former    Packs/day: 3.00    Years: 8.00    Total pack years: 24.00    Types: Cigarettes, Cigars, Pipe    Start date: 06/28/1984    Quit date: 12/24/1991    Years since quitting: 31.0   Smokeless tobacco: Former    Types: Loss adjuster, chartered  Quit date: 12/24/1991  Substance Use Topics   Alcohol use: No    Alcohol/week: 0.0 standard drinks of alcohol    FAMILY HISTORY:   Family History  Problem Relation Age of Onset   Coronary artery disease Mother    Cancer Father        Melanoma and Testicular   Diabetes Father     DRUG ALLERGIES:   Allergies  Allergen Reactions   Morphine Itching    REVIEW OF SYSTEMS:   ROS As per history of present illness. All pertinent systems were reviewed above. Constitutional, HEENT, cardiovascular, respiratory, GI, GU, musculoskeletal, neuro, psychiatric, endocrine, integumentary and hematologic systems were reviewed and are otherwise negative/unremarkable except for positive findings mentioned above  in the HPI.   MEDICATIONS AT HOME:   Prior to Admission medications   Medication Sig Start Date End Date Taking? Authorizing Provider  baclofen (LIORESAL) 10 MG tablet Take 10 mg by mouth every 8 (eight) hours as needed. 01/02/23  Yes [provider]  ondansetron (ZOFRAN) 4 MG tablet Take 4 mg by mouth every 8 (eight) hours as needed. 01/02/23  Yes [provider]  oxyCODONE (OXY IR/ROXICODONE) 5 MG immediate release tablet Take 5 mg by mouth every 4 (four) hours as needed. 01/02/23  Yes [provider]  SENOKOT S 8.6-50 MG tablet Take 2 tablets by mouth daily. 01/02/23  Yes [provider]  ALLERGY RELIEF 180 MG tablet TAKE 1 TABLET(180 MG) BY MOUTH DAILY 11/14/21   [name], MD  allopurinol (ZYLOPRIM) 100 MG tablet Take 1 tablet (100 mg total) by mouth 2 (two) times daily. 06/12/22   [name], MD  benzonatate (TESSALON) 100 MG capsule Take 1-2 capsules (100-200 mg total) by mouth 2 (two) times daily as needed. 11/12/22   [name], MD  blood glucose meter kit and supplies Dispense based on patient and insurance preference. Use up to four times daily as directed. (FOR ICD-10 E10.9, E11.9). 01/09/21   [name], MD  busPIRone (BUSPAR) 5 MG tablet TAKE 1 TO 2 TABLETS BY MOUTH THREE TIMES DAILY 08/01/22   [name], MD  cyclobenzaprine (FLEXERIL) 10 MG tablet Take 1 tablet (10 mg total) by mouth at bedtime. TAKE 1 TABLET BY MOUTH AT BEDTIME 06/11/22   [name], MD  DULoxetine (CYMBALTA) 60 MG capsule Take 1 capsule (60 mg total) by mouth daily. 06/11/22   [name], MD  fluticasone (FLONASE) 50 MCG/ACT nasal spray Place 2 sprays into both nostrils daily. 11/12/22   [name], MD  levothyroxine (SYNTHROID) 75 MCG tablet Take 1 tablet (75 mcg total) by mouth daily. 12/09/22   [name], MD  Magnesium Oxide 500 MG TABS Take 1 tablet by mouth once daily 06/03/22   [name], MD  metFORMIN (GLUCOPHAGE-XR) 750 MG 24  hr tablet Take 1 tablet (750 mg total) by mouth daily with breakfast. 12/09/22   [name], MD  pregabalin (LYRICA) 225 MG capsule Take 1 capsule (225 mg total) by mouth 2 (two) times daily. 09/12/22   [name], MD  QUEtiapine (SEROQUEL) 25 MG tablet Take 1 tablet (25 mg total) by mouth at bedtime. 09/12/22   [name], MD  rosuvastatin (CRESTOR) 5 MG tablet Take 1 tablet (5 mg total) by mouth daily. 12/09/22   [name], MD  testosterone cypionate (DEPOTESTOSTERONE CYPIONATE) 200 MG/ML injection ADMINISTER 1 ML(200 MG) IN THE MUSCLE EVERY 14 DAYS 12/09/22   Stoioff, Ronda Fairly, MD  tirzepatide [facility]) 10 MG/0.5ML Pen Inject 10 mg  into the skin once a week. 12/12/22   [name], MD  traZODone (DESYREL) 100 MG tablet TAKE 1 & 1/2 (ONE & ONE-HALF) TABLETS BY MOUTH AT BEDTIME 12/12/22   [name], MD  valsartan (DIOVAN) 320 MG tablet Take 1 tablet (320 mg total) by mouth daily. 09/12/22   [name], MD      VITAL SIGNS:  Blood pressure 133/80, pulse 71, temperature 98.6 F (37 C), temperature source Oral, resp. rate 20, weight (!) 148.3 kg, SpO2 94 %.  PHYSICAL EXAMINATION:  Physical Exam  GENERAL:  52 y.o.-year-old Caucasian male patient lying in the bed with no acute distress.  EYES: Pupils equal, round, reactive to light and accommodation. No scleral icterus. Extraocular muscles intact.  HEENT: Head atraumatic, normocephalic. Oropharynx and nasopharynx clear.  NECK:  Supple, no jugular venous distention. No thyroid enlargement, no tenderness.  LUNGS: Diminished left basal breath sounds with left basal crackles.  No use of accessory muscles of respiration.  CARDIOVASCULAR: Regular rate and rhythm, S1, S2 normal. No murmurs, rubs, or gallops.  ABDOMEN: Soft, nondistended, nontender. Bowel sounds present. No organomegaly or mass.  EXTREMITIES: No pedal edema, cyanosis, or clubbing.  NEUROLOGIC: Cranial nerves II through XII are intact. Muscle strength  5/5 in all extremities. Sensation intact. Gait not checked.  PSYCHIATRIC: The patient is alert and oriented x 3.  Normal affect and good eye contact. SKIN: No obvious rash, lesion, or ulcer.   LABORATORY PANEL:   CBC Recent Labs  Lab 01/04/23 1714  WBC 10.1  HGB 13.4  HCT 42.6  PLT 203   ------------------------------------------------------------------------------------------------------------------  Chemistries  Recent Labs  Lab 01/04/23 1714 01/04/23 1850  NA 138  --   K 4.4  --   CL 105  --   CO2 25  --   GLUCOSE 269*  --   BUN 14  --   CREATININE 1.33*  --   CALCIUM 8.4*  --   AST  --  19  ALT  --  10  ALKPHOS  --  76  BILITOT  --  0.7   ------------------------------------------------------------------------------------------------------------------  Cardiac Enzymes No results for input(s): "TROPONINI" in the last 168 hours. ------------------------------------------------------------------------------------------------------------------  RADIOLOGY:  US Venous Img Lower Bilateral  Result Date: 01/04/2023 CLINICAL DATA:  Bilateral leg swelling and shortness of breath with suboptimal CTA of the chest. EXAM: BILATERAL LOWER EXTREMITY VENOUS DOPPLER ULTRASOUND TECHNIQUE: Gray-scale sonography with graded compression, as well as color Doppler and duplex ultrasound were performed to evaluate the lower extremity deep venous systems from the level of the common femoral vein and including the common femoral, femoral, profunda femoral, popliteal and calf veins including the posterior tibial, peroneal and gastrocnemius veins when visible. The superficial great saphenous vein was also interrogated. Spectral Doppler was utilized to evaluate flow at rest and with distal augmentation maneuvers in the common femoral, femoral and popliteal veins. COMPARISON:  None Available. FINDINGS: RIGHT LOWER EXTREMITY Common Femoral Vein: No evidence of thrombus. Normal compressibility,  respiratory phasicity and response to augmentation. Saphenofemoral Junction: No evidence of thrombus. Normal compressibility and flow on color Doppler imaging. Profunda Femoral Vein: No evidence of thrombus. Normal compressibility and flow on color Doppler imaging. Femoral Vein: No evidence of thrombus. Normal compressibility, respiratory phasicity and response to augmentation. Popliteal Vein: No evidence of thrombus. Normal compressibility, respiratory phasicity and response to augmentation. Calf Veins: No evidence of thrombus. Normal compressibility and flow on color Doppler imaging. Superficial Great Saphenous Vein: No evidence of thrombus. Normal compressibility. Venous Reflux:  None. Other Findings:  None. LEFT LOWER EXTREMITY Common Femoral Vein: No evidence of thrombus. Normal compressibility, respiratory phasicity and response to augmentation. Saphenofemoral Junction: No evidence of thrombus. Normal compressibility and flow on color Doppler imaging. Profunda Femoral Vein: No evidence of thrombus. Normal compressibility and flow on color Doppler imaging. Femoral Vein: No evidence of thrombus. Normal compressibility, respiratory phasicity and response to augmentation. Popliteal Vein: No evidence of thrombus. Normal compressibility, respiratory phasicity and response to augmentation. Calf Veins: No evidence of thrombus. Normal compressibility and flow on color Doppler imaging. Superficial Great Saphenous Vein: No evidence of thrombus. Normal compressibility. Venous Reflux:  None. Other Findings:  None. IMPRESSION: No evidence of deep venous thrombosis in either lower extremity. Electronically Signed   By: Inez Catalina M.D.   On: 01/04/2023 22:12   CT HEAD WO CONTRAST (5MM)  Result Date: 01/04/2023 CLINICAL DATA:  Head trauma, abnormal mental status (Age 66-64y) EXAM: CT HEAD WITHOUT CONTRAST TECHNIQUE: Contiguous axial images were obtained from the base of the skull through the vertex without intravenous  contrast. RADIATION DOSE REDUCTION: This exam was performed according to the departmental dose-optimization program which includes automated exposure control, adjustment of the mA and/or kV according to patient size and/or use of iterative reconstruction technique. COMPARISON:  None Available. FINDINGS: Brain: No evidence of acute infarction, hemorrhage, hydrocephalus, extra-axial collection or mass lesion/mass effect. Vascular: No hyperdense vessel or unexpected calcification. Skull: Normal. Negative for fracture or focal lesion. Sinuses/Orbits: No acute finding. Other: None. IMPRESSION: No acute intracranial abnormality. Electronically Signed   By: Valentino Saxon M.D.   On: 01/04/2023 19:47   CT Angio Chest PE W and/or Wo Contrast  Result Date: 01/04/2023 CLINICAL DATA:  Pulmonary embolism (PE) suspected, high prob EXAM: CT ANGIOGRAPHY CHEST WITH CONTRAST TECHNIQUE: Multidetector CT imaging of the chest was performed using the standard protocol during bolus administration of intravenous contrast. Multiplanar CT image reconstructions and MIPs were obtained to evaluate the vascular anatomy. RADIATION DOSE REDUCTION: This exam was performed according to the departmental dose-optimization program which includes automated exposure control, adjustment of the mA and/or kV according to patient size and/or use of iterative reconstruction technique. CONTRAST:  28m OMNIPAQUE IOHEXOL 350 MG/ML SOLN COMPARISON:  None Available. FINDINGS: Cardiovascular: Evaluation is limited by patient positioning, extensive motion and suboptimal contrast timing. Evaluation is particularly limited at the bases. Within these limitations, no central pulmonary embolism. Cardiomegaly. Mild atherosclerotic calcifications of the thoracic aorta. No pericardial effusion. Mediastinum/Nodes: Visualized thyroid is unremarkable. No axillary or mediastinal adenopathy. Lungs/Pleura: No pleural effusion or pneumothorax. LEFT lower lobe and lingular  heterogeneously enhancing consolidative opacity. Evaluation of the pulmonary parenchyma is limited secondary to extensive respiratory motion. Upper Abdomen: Small hiatal hernia. Status post gastric bypass surgery. Spleen measures the upper limits of normal in size Musculoskeletal: No acute osseous abnormality. Review of the MIP images confirms the above findings. IMPRESSION: 1. Limited examination secondary to respiratory motion. No central pulmonary embolism. If persistent clinical concern, recommend dedicated lower extremity ultrasound. 2. Heterogeneously enhancing consolidative opacity within the LEFT lower lobe and lingula. This could reflect atelectasis or infection. Aortic Atherosclerosis (ICD10-I70.0). Electronically Signed   By: SValentino SaxonM.D.   On: 01/04/2023 19:45   DG Chest 2 View  Result Date: 01/04/2023 CLINICAL DATA:  Dyspnea EXAM: CHEST - 2 VIEW COMPARISON:  02/09/2009 chest radiograph. FINDINGS: Stable cardiomediastinal silhouette with normal heart size. No pneumothorax. No right pleural effusion. Probable small left pleural effusion. No overt pulmonary edema. Hazy left lung base opacity.  IMPRESSION: Probable small left pleural effusion. Hazy left lung base opacity, which could represent pneumonia, aspiration or atelectasis. Follow-up chest radiographs advised to document resolution. Electronically Signed   By: Ilona Sorrel M.D.   On: 01/04/2023 17:48      IMPRESSION AND PLAN:  Assessment and Plan: * Aspiration pneumonia ([place]) - The patient be admitted to a medical telemetry bed. - This is left lower lobe aspiration pneumonia likely related to anesthesia. - We will continue antibiotic therapy with IV Rocephin and Zithromax. - We will add IV Flagyl to cover anaerobes. - We we will follow blood cultures. - Mucolytic therapy and bronchodilator therapy will be provided.  Acute respiratory failure with hypoxia (HCC) - O2 protocol will be followed. - This is likely secondary to  #1.  Essential hypertension - We will continue his antihypertensives.  Type 2 diabetes mellitus with peripheral neuropathy (HCC) - It is currently mildly uncontrolled. - We will place him on supplemental coverage with NovoLog and hold off metformin given recent contrast. - We will continue Lyrica.  Anxiety and depression - We will continue BuSpar and Cymbalta as well as Seroquel.  Dyslipidemia - We will continue statin therapy.   DVT prophylaxis: Lovenox.  Advanced Care Planning:  Code Status: full code.  Family Communication:  The plan of care was discussed in details with the patient (and family). I answered all questions. The patient agreed to proceed with the above mentioned plan. Further management will depend upon hospital course. Disposition Plan: Back to previous home environment Consults called: none.  All the records are reviewed and case discussed with ED provider.  Status is: Inpatient   At the time of the admission, it appears that the appropriate admission status for this patient is inpatient.  This is judged to be reasonable and necessary in order to provide the required intensity of service to ensure the patient's safety given the presenting symptoms, physical exam findings and initial radiographic and laboratory data in the context of comorbid conditions.  The patient requires inpatient status due to high intensity of service, high risk of further deterioration and high frequency of surveillance required.  I certify that at the time of admission, it is my clinical judgment that the patient will require inpatient hospital care extending more than 2 midnights.                            Dispo: The patient is from: Home              Anticipated d/c is to: Home              Patient currently is not medically stable to d/c.              Difficult to place patient: No  Christel Mormon M.D on 01/04/2023 at 10:57 PM  [facility]   From 7 PM-7 AM, contact  night-coverage [email or web address]  CC: Primary care physician; [name], MD

## 2023-01-04 NOTE — ED Provider Notes (Signed)
[facility] Provider Note    Event Date/Time   First MD Initiated Contact with Patient 01/04/23 1828     (approximate)   History   Shortness of Breath (Patient presents with shortness of breath (no chest pain) that began yesterday; He also reports a single episode of dizziness last night; He recently had a rotator cuff repair on his LEFT shoulder; Denies cardiac / pulmonary medical history)   HPI  [name] is a 52 y.o. male who comes in with shortness of breath.  He reports that the shortness of breath began yesterday.  He did have a little episode of dizziness last night.  Did have recent Rotary cuff surgery.  Patient reports that shortness of breath started yesterday been constant no cough fevers or other symptoms with it.  He does report having some dizziness yesterday but did not fall did not hit his head.  He reports the dizziness now has mostly resolved.  He denies any abdominal pain.  Denies any history of blood clots occasionally smokes no history of COPD.  Physical Exam   Triage Vital Signs: ED Triage Vitals  Enc Vitals Group     BP 01/04/23 1712 (!) 148/105     Pulse Rate 01/04/23 1712 88     Resp 01/04/23 1712 20     Temp 01/04/23 1712 98 F (36.7 C)     Temp Source 01/04/23 1712 Oral     SpO2 01/04/23 1712 96 %     Weight --      Height --      Head Circumference --      Peak Flow --      Pain Score 01/04/23 1709 0     Pain Loc --      Pain Edu? --      Excl. in [place]? --     Most recent vital signs: Vitals:   01/04/23 1712  BP: (!) 148/105  Pulse: 88  Resp: 20  Temp: 98 F (36.7 C)  SpO2: 96%     General: Awake, no distress.  CV:  Good peripheral perfusion.  Resp:  Normal effort.  Clear lungs Abd:  No distention.  Soft nontender Other:  No swelling in legs.  No calf tenderness   ED Results / Procedures / Treatments   Labs (all labs ordered are listed, but only abnormal results are displayed) Labs Reviewed  CBC  WITH DIFFERENTIAL/PLATELET - Abnormal; Notable for the following components:      Result Value   Neutro Abs 8.4 (*)    All other components within normal limits  BASIC METABOLIC PANEL - Abnormal; Notable for the following components:   Glucose, Bld 269 (*)    Creatinine, Ser 1.33 (*)    Calcium 8.4 (*)    All other components within normal limits  TROPONIN I (HIGH SENSITIVITY)     EKG  My interpretation of EKG:  Normal sinus rate of 80 without any ST elevation or T wave inversions, normal intervals  RADIOLOGY I have reviewed the xray personally and interpreted and there is a small left pleural effusion with some haziness in the left lung base.   PROCEDURES:  Critical Care performed: No  Procedures   MEDICATIONS ORDERED IN ED: Medications  cefTRIAXone (ROCEPHIN) 2 g in sodium chloride 0.9 % 100 mL IVPB (2 g Intravenous New Bag/Given 01/04/23 2054)  azithromycin (ZITHROMAX) 500 mg in sodium chloride 0.9 % 250 mL IVPB (has no administration in time range)  iohexol (OMNIPAQUE) 350 MG/ML injection 80 mL (80 mLs Intravenous Contrast Given 01/04/23 1909)  ipratropium-albuterol (DUONEB) 0.5-2.5 (3) MG/3ML nebulizer solution 3 mL (3 mLs Nebulization Given 01/04/23 2052)     IMPRESSION / MDM / ASSESSMENT AND PLAN / ED COURSE  I reviewed the triage vital signs and the nursing notes.   Patient's presentation is most consistent with acute presentation with potential threat to life or bodily function.   Differential includes PE, ACS, COVID, flu, pneumonia. Patient is not hypoxic no wheezing on examination.  BMP shows elevated creatinine of 1.33 but stable from 3 months ago.  Sugar slightly elevated but no signs of a of DKA CBC reassuring Troponin negative  I did order a CT scan to evaluate for PE given x-ray did not know if there was potentially atelectasis or pneumonia based not really having any symptoms of pneumonia.  CT scan was somewhat limited secondary to respiratory  motion but there is no signs of central PE but they did recommend a dedicated lower extremity ultrasound.  There was concern for possible left lower lobe pneumonia versus atelectasis. CT head was negative.  Reevaluated patient similar shortness of breath.  With a little bit of belly breathing.  Will trial a DuoNeb and see if this helps.  Will give ambulatory sat in the ultrasounds   Patient desatted down to 85% with ambulation.  Discussed with patient will admit for hypoxia with ambulation possible pneumonia as well as finishing workup for DVTs.  Patient does not meet sepsis criteria we will hold off on blood cultures.  The patient is on the cardiac monitor to evaluate for evidence of arrhythmia and/or significant heart rate changes.      FINAL CLINICAL IMPRESSION(S) / ED DIAGNOSES   Final diagnoses:  SOB (shortness of breath)  Pneumonia of left lower lobe due to infectious organism     Rx / DC Orders   ED Discharge Orders     None        Note:  This document was prepared using Dragon voice recognition software and may include unintentional dictation errors.   Vanessa [place], MD 01/04/23 2056

## 2023-01-04 NOTE — Assessment & Plan Note (Signed)
-  We will continue statin therapy. 

## 2023-01-04 NOTE — Assessment & Plan Note (Addendum)
-  It is currently mildly uncontrolled. - We will place him on supplemental coverage with NovoLog and hold off metformin given recent contrast. - We will continue Lyrica.

## 2023-01-05 ENCOUNTER — Encounter: Payer: Self-pay | Admitting: Family Medicine

## 2023-01-05 DIAGNOSIS — J690 Pneumonitis due to inhalation of food and vomit: Secondary | ICD-10-CM | POA: Diagnosis not present

## 2023-01-05 LAB — BASIC METABOLIC PANEL
Anion gap: 5 (ref 5–15)
BUN: 14 mg/dL (ref 6–20)
CO2: 25 mmol/L (ref 22–32)
Calcium: 7.6 mg/dL — ABNORMAL LOW (ref 8.9–10.3)
Chloride: 107 mmol/L (ref 98–111)
Creatinine, Ser: 1.12 mg/dL (ref 0.61–1.24)
GFR, Estimated: >60 mL/min (ref >60)
Glucose, Bld: 171 mg/dL — ABNORMAL HIGH (ref 70–99)
Potassium: 4 mmol/L (ref 3.5–5.1)
Sodium: 137 mmol/L (ref 135–145)

## 2023-01-05 LAB — CBC
HCT: 36.9 % — ABNORMAL LOW (ref 39.0–52.0)
Hemoglobin: 11.6 g/dL — ABNORMAL LOW (ref 13.0–17.0)
MCH: 28.4 pg (ref 26.0–34.0)
MCHC: 31.4 g/dL (ref 30.0–36.0)
MCV: 90.4 fL (ref 80.0–100.0)
Platelets: 165 10*3/uL (ref 150–400)
RBC: 4.08 MIL/uL — ABNORMAL LOW (ref 4.22–5.81)
RDW: 13.8 % (ref 11.5–15.5)
WBC: 7.3 10*3/uL (ref 4.0–10.5)
nRBC: 0 % (ref 0.0–0.2)

## 2023-01-05 LAB — GLUCOSE, CAPILLARY
Glucose-Capillary: 163 mg/dL — ABNORMAL HIGH (ref 70–99)
Glucose-Capillary: 153 mg/dL — ABNORMAL HIGH (ref 70–99)
Glucose-Capillary: 239 mg/dL — ABNORMAL HIGH (ref 70–99)
Glucose-Capillary: 191 mg/dL — ABNORMAL HIGH (ref 70–99)

## 2023-01-05 LAB — PROCALCITONIN: Procalcitonin: <0.1 ng/mL

## 2023-01-05 MED ORDER — INSULIN ASPART 100 UNIT/ML IJ SOLN
0.0000 [IU] | Freq: Three times a day (TID) | INTRAMUSCULAR | Status: DC
  Administered 2023-01-05: 2 [IU] via SUBCUTANEOUS
  Administered 2023-01-05: 3 [IU] via SUBCUTANEOUS
  Filled 2023-01-05 (×2): qty 1

## 2023-01-05 MED ORDER — AMOXICILLIN-POT CLAVULANATE 875-125 MG PO TABS: 1.0000 | ORAL_TABLET | Freq: Two times a day (BID) | ORAL | Status: DC

## 2023-01-05 MED ORDER — AZITHROMYCIN 250 MG PO TABS
250.0000 mg | ORAL_TABLET | Freq: Every day | ORAL | Status: DC
  Administered 2023-01-05: 250 mg via ORAL
  Filled 2023-01-05: qty 1

## 2023-01-05 MED ORDER — IPRATROPIUM-ALBUTEROL 0.5-2.5 (3) MG/3ML IN SOLN
3.0000 mL | Freq: Three times a day (TID) | RESPIRATORY_TRACT | Status: DC
  Administered 2023-01-05 – 2023-01-06 (×3): 3 mL via RESPIRATORY_TRACT
  Filled 2023-01-05 (×3): qty 3

## 2023-01-05 MED ORDER — INSULIN ASPART 100 UNIT/ML IJ SOLN: 0.0000 [IU] | Freq: Three times a day (TID) | INTRAMUSCULAR | Status: DC

## 2023-01-05 MED ORDER — METFORMIN HCL ER 750 MG PO TB24: 750.0000 mg | ORAL_TABLET | Freq: Every day | ORAL | Status: DC

## 2023-01-05 MED ORDER — ALBUTEROL SULFATE (2.5 MG/3ML) 0.083% IN NEBU: 2.5000 mg | INHALATION_SOLUTION | RESPIRATORY_TRACT | Status: DC | PRN

## 2023-01-05 MED ORDER — INSULIN ASPART 100 UNIT/ML IJ SOLN: 0.0000 [IU] | Freq: Every day | INTRAMUSCULAR | Status: DC

## 2023-01-05 MED ORDER — SODIUM CHLORIDE 0.9 % IV SOLN: INTRAVENOUS | Status: DC | PRN

## 2023-01-05 NOTE — Progress Notes (Signed)
[facility] [place] NAME: [name]    MR#:  [id number]  DATE OF BIRTH:  1971/05/25  SUBJECTIVE:  life at bedside. Patient came in after he started noticing shortness of breath. He had rotator cuff surgery on Thursday January 11th. Patient started noticing increasing shortness of breath on Friday. He smokes regularly. No formal diagnosis of COPD. Feels a bit better. Notice to have decreased saturation on exertion. Admitted with possible aspiration pneumonia.  VITALS:  Blood pressure 95/60, pulse 70, temperature 97.7 F (36.5 C), temperature source Oral, resp. rate 18, height '5\' 11"'$  (1.803 m), weight (!) 148.3 kg, SpO2 93 %.  PHYSICAL EXAMINATION:   GENERAL:  52 y.o.-year-old patient lying in the bed with no acute distress. Obese Poor dentition LUNGS: decreased breath sounds bilaterally, no wheezing CARDIOVASCULAR: S1, S2 normal. No murmur   ABDOMEN: Soft, nontender, nondistended. Bowel sounds present.  EXTREMITIES: No  edema b/l.    NEUROLOGIC: nonfocal  patient is alert and awake SKIN: No obvious rash, lesion, or ulcer.   LABORATORY PANEL:  CBC Recent Labs  Lab 01/05/23 0612  WBC 7.3  HGB 11.6*  HCT 36.9*  PLT 165    Chemistries  Recent Labs  Lab 01/04/23 1850 01/05/23 0612  NA  --  137  K  --  4.0  CL  --  107  CO2  --  25  GLUCOSE  --  171*  BUN  --  14  CREATININE  --  1.12  CALCIUM  --  7.6*  AST 19  --   ALT 10  --   ALKPHOS 76  --   BILITOT 0.7  --    Cardiac Enzymes No results for input(s): "TROPONINI" in the last 168 hours. RADIOLOGY:  US Venous Img Lower Bilateral  Result Date: 01/04/2023 CLINICAL DATA:  Bilateral leg swelling and shortness of breath with suboptimal CTA of the chest. EXAM: BILATERAL LOWER EXTREMITY VENOUS DOPPLER ULTRASOUND TECHNIQUE: Gray-scale sonography with graded compression, as well as color Doppler and duplex ultrasound were performed to evaluate the lower extremity deep  venous systems from the level of the common femoral vein and including the common femoral, femoral, profunda femoral, popliteal and calf veins including the posterior tibial, peroneal and gastrocnemius veins when visible. The superficial great saphenous vein was also interrogated. Spectral Doppler was utilized to evaluate flow at rest and with distal augmentation maneuvers in the common femoral, femoral and popliteal veins. COMPARISON:  None Available. FINDINGS: RIGHT LOWER EXTREMITY Common Femoral Vein: No evidence of thrombus. Normal compressibility, respiratory phasicity and response to augmentation. Saphenofemoral Junction: No evidence of thrombus. Normal compressibility and flow on color Doppler imaging. Profunda Femoral Vein: No evidence of thrombus. Normal compressibility and flow on color Doppler imaging. Femoral Vein: No evidence of thrombus. Normal compressibility, respiratory phasicity and response to augmentation. Popliteal Vein: No evidence of thrombus. Normal compressibility, respiratory phasicity and response to augmentation. Calf Veins: No evidence of thrombus. Normal compressibility and flow on color Doppler imaging. Superficial Great Saphenous Vein: No evidence of thrombus. Normal compressibility. Venous Reflux:  None. Other Findings:  None. LEFT LOWER EXTREMITY Common Femoral Vein: No evidence of thrombus. Normal compressibility, respiratory phasicity and response to augmentation. Saphenofemoral Junction: No evidence of thrombus. Normal compressibility and flow on color Doppler imaging. Profunda Femoral Vein: No evidence of thrombus. Normal compressibility and flow on color Doppler imaging. Femoral Vein: No evidence of thrombus. Normal compressibility, respiratory phasicity and response to augmentation. Popliteal Vein:  No evidence of thrombus. Normal compressibility, respiratory phasicity and response to augmentation. Calf Veins: No evidence of thrombus. Normal compressibility and flow on color  Doppler imaging. Superficial Great Saphenous Vein: No evidence of thrombus. Normal compressibility. Venous Reflux:  None. Other Findings:  None. IMPRESSION: No evidence of deep venous thrombosis in either lower extremity. Electronically Signed   By: Inez Catalina M.D.   On: 01/04/2023 22:12   CT HEAD WO CONTRAST (5MM)  Result Date: 01/04/2023 CLINICAL DATA:  Head trauma, abnormal mental status (Age 32-64y) EXAM: CT HEAD WITHOUT CONTRAST TECHNIQUE: Contiguous axial images were obtained from the base of the skull through the vertex without intravenous contrast. RADIATION DOSE REDUCTION: This exam was performed according to the departmental dose-optimization program which includes automated exposure control, adjustment of the mA and/or kV according to patient size and/or use of iterative reconstruction technique. COMPARISON:  None Available. FINDINGS: Brain: No evidence of acute infarction, hemorrhage, hydrocephalus, extra-axial collection or mass lesion/mass effect. Vascular: No hyperdense vessel or unexpected calcification. Skull: Normal. Negative for fracture or focal lesion. Sinuses/Orbits: No acute finding. Other: None. IMPRESSION: No acute intracranial abnormality. Electronically Signed   By: Valentino Saxon M.D.   On: 01/04/2023 19:47   CT Angio Chest PE W and/or Wo Contrast  Result Date: 01/04/2023 CLINICAL DATA:  Pulmonary embolism (PE) suspected, high prob EXAM: CT ANGIOGRAPHY CHEST WITH CONTRAST TECHNIQUE: Multidetector CT imaging of the chest was performed using the standard protocol during bolus administration of intravenous contrast. Multiplanar CT image reconstructions and MIPs were obtained to evaluate the vascular anatomy. RADIATION DOSE REDUCTION: This exam was performed according to the departmental dose-optimization program which includes automated exposure control, adjustment of the mA and/or kV according to patient size and/or use of iterative reconstruction technique. CONTRAST:  71m  OMNIPAQUE IOHEXOL 350 MG/ML SOLN COMPARISON:  None Available. FINDINGS: Cardiovascular: Evaluation is limited by patient positioning, extensive motion and suboptimal contrast timing. Evaluation is particularly limited at the bases. Within these limitations, no central pulmonary embolism. Cardiomegaly. Mild atherosclerotic calcifications of the thoracic aorta. No pericardial effusion. Mediastinum/Nodes: Visualized thyroid is unremarkable. No axillary or mediastinal adenopathy. Lungs/Pleura: No pleural effusion or pneumothorax. LEFT lower lobe and lingular heterogeneously enhancing consolidative opacity. Evaluation of the pulmonary parenchyma is limited secondary to extensive respiratory motion. Upper Abdomen: Small hiatal hernia. Status post gastric bypass surgery. Spleen measures the upper limits of normal in size Musculoskeletal: No acute osseous abnormality. Review of the MIP images confirms the above findings. IMPRESSION: 1. Limited examination secondary to respiratory motion. No central pulmonary embolism. If persistent clinical concern, recommend dedicated lower extremity ultrasound. 2. Heterogeneously enhancing consolidative opacity within the LEFT lower lobe and lingula. This could reflect atelectasis or infection. Aortic Atherosclerosis (ICD10-I70.0). Electronically Signed   By: SValentino SaxonM.D.   On: 01/04/2023 19:45   DG Chest 2 View  Result Date: 01/04/2023 CLINICAL DATA:  Dyspnea EXAM: CHEST - 2 VIEW COMPARISON:  02/09/2009 chest radiograph. FINDINGS: Stable cardiomediastinal silhouette with normal heart size. No pneumothorax. No right pleural effusion. Probable small left pleural effusion. No overt pulmonary edema. Hazy left lung base opacity. IMPRESSION: Probable small left pleural effusion. Hazy left lung base opacity, which could represent pneumonia, aspiration or atelectasis. Follow-up chest radiographs advised to document resolution. Electronically Signed   By: JIlona SorrelM.D.   On:  01/04/2023 17:48    Assessment and Plan  WDIEZEL MAZURis a 52y.o. Caucasian male with medical history significant for hypothyroidism gout, OSA on CPAP, who recently  underwent left rotator cuff surgery, who presents to the emergency room with acute onset of dyspnea since yesterday with dizziness that started last nigh  In the ER, BP was 148/105 with otherwise normal vital signs. Pulse ox dropped to 85% on room air upon ambulation.  Chest CTA PE revealed no evidence for PE and showed heterogenously enhancing consolidative opacity within the left lower lobe and lingula that could reflect atelectasis or infection and aortic atherosclerosis.  Non-contrasted CT scan revealed no acute intracranial abnormality.   Aspiration pneumonia ([place]) - left lower lobe aspiration pneumonia likely related to anesthesia. -  IV Rocephin and Zithromax--change to po augmentin from tomorrow --sats stable on RA --pt likely has underlying COPD with h/o smoking --IS, advised smoking cessation - COVID and Influenza neg - Mucolytic therapy and bronchodilator therapy prn   Acute respiratory failure with hypoxia (HCC) - O2 protocol will be followed. - This is likely secondary to #1. --currently on RA --will assess oxygen for home   Essential hypertension - continue his antihypertensives.   Type 2 diabetes mellitus with peripheral neuropathy (HCC) -  on supplemental coverage with NovoLog and hold off metformin till 1/16 given recent contrast. - continue Lyrica.   Anxiety and depression - continue BuSpar, Cymbalta  and Seroquel.   Dyslipidemia -  continue statin therapy.     DVT prophylaxis: Lovenox.  Advanced Care Planning:  Code Status: full code.   Family communication :wife at bedside Consults :none DVT Prophylaxis :lovenox Status is: Inpatient Remains inpatient appropriate because: mnx of pneumonia    TOTAL TIME TAKING CARE OF THIS PATIENT: 35 minutes.  >50% time spent on counselling and  coordination of care  Note: This dictation was prepared with Dragon dictation along with smaller phrase technology. Any transcriptional errors that result from this process are unintentional.  [name] M.D    [facility]   CC: Primary care physician; [name], MD

## 2023-01-06 ENCOUNTER — Ambulatory Visit: Payer: Medicare Other | Admitting: Family Medicine

## 2023-01-06 ENCOUNTER — Other Ambulatory Visit: Payer: Self-pay | Admitting: Family Medicine

## 2023-01-06 DIAGNOSIS — F341 Dysthymic disorder: Secondary | ICD-10-CM

## 2023-01-06 DIAGNOSIS — J690 Pneumonitis due to inhalation of food and vomit: Secondary | ICD-10-CM | POA: Diagnosis not present

## 2023-01-06 LAB — HIV ANTIBODY (ROUTINE TESTING W REFLEX): HIV Screen 4th Generation wRfx: NONREACTIVE

## 2023-01-06 LAB — GLUCOSE, CAPILLARY: Glucose-Capillary: 193 mg/dL — ABNORMAL HIGH (ref 70–99)

## 2023-01-06 MED ORDER — AMOXICILLIN-POT CLAVULANATE 875-125 MG PO TABS: 1.0000 | ORAL_TABLET | Freq: Two times a day (BID) | ORAL | 0 refills | Status: AC

## 2023-01-06 MED ORDER — GUAIFENESIN ER 600 MG PO TB12: 600.0000 mg | ORAL_TABLET | Freq: Two times a day (BID) | ORAL | 0 refills | Status: AC

## 2023-01-06 MED ORDER — DULOXETINE HCL 60 MG PO CPEP: 60.0000 mg | ORAL_CAPSULE | Freq: Every day | ORAL | 1 refills | Status: DC

## 2023-01-06 MED ORDER — ALBUTEROL SULFATE HFA 108 (90 BASE) MCG/ACT IN AERS: 2.0000 | INHALATION_SPRAY | Freq: Four times a day (QID) | RESPIRATORY_TRACT | 2 refills | Status: AC | PRN

## 2023-01-06 NOTE — Discharge Summary (Signed)
Physician Discharge Summary   Patient: [name] MRN: [id number] DOB: May 31, 1971  Admit date:     01/04/2023  Discharge date: 01/06/23  Discharge Physician: [name]   PCP: [name], MD   Recommendations at discharge:    Smoking cessation advised Keep log of your sugars at home. Take you Diabetes meds and discuss with your PCP F/u PCP in 1-2 weeks  Discharge Diagnoses: Principal Problem:   Aspiration pneumonia ([place]) Active Problems:   Acute respiratory failure with hypoxia ([place])   Dyslipidemia   Anxiety and depression   Type 2 diabetes mellitus with peripheral neuropathy ([place])   Essential hypertension  Assessment and Plan   [name] is a 52 y.o. Caucasian male with medical history significant for hypothyroidism gout, OSA on CPAP, who recently underwent left rotator cuff surgery, who presents to the emergency room with acute onset of dyspnea since yesterday with dizziness that started last nigh  In the ER, BP was 148/105 with otherwise normal vital signs. Pulse ox dropped to 85% on room air upon ambulation.  Chest CTA PE revealed no evidence for PE and showed heterogenously enhancing consolidative opacity within the left lower lobe and lingula that could reflect atelectasis or infection and aortic atherosclerosis.  Non-contrasted CT scan revealed no acute intracranial abnormality.    Aspiration pneumonia ([name])  left lower lobe aspiration pneumonia likely related to anesthesia. -  IV Rocephin and Zithromax--change to po augmentin from today --prn Proventil --sats stable on RA --pt likely has underlying COPD with h/o smoking --IS, advised smoking cessation - COVID and Influenza neg - Mucolytic  and bronchodilator therapy prn   Acute respiratory failure with hypoxia ([name]) - This is likely secondary to #1. --currently on RA --will assess oxygen for home   Essential hypertension - continue his antihypertensives.   Type 2 diabetes mellitus with  peripheral neuropathy ([facility]) -  on supplemental coverage with NovoLog and hold off metformin till 1/16 given recent contrast. - continue Lyrica.   Anxiety and depression - continue BuSpar, Cymbalta  and Seroquel.   Dyslipidemia -  continue statin therapy.  Recent left shoulder Rotator cuff repair --cont sling. F/u ortho on your appt     DVT prophylaxis: Lovenox.  Advanced Care Planning:  Code Status: full code.    Family communication :wife at bedside Consults :none DVT Prophylaxis :lovenox  Overall stable. D/c home.pt and family agreeable    Diet recommendation:  Discharge Diet Orders (From admission, onward)     Start     Ordered   01/06/23 0000  Diet - low sodium heart healthy        01/06/23 0823           Cardiac and Carb modified diet DISCHARGE MEDICATION: Allergies as of 01/06/2023       Reactions   Morphine Itching        Medication List     STOP taking these medications    benzonatate 100 MG capsule Commonly known as: TESSALON       TAKE these medications    albuterol 108 (90 Base) MCG/ACT inhaler Commonly known as: VENTOLIN HFA Inhale 2 puffs into the lungs every 6 (six) hours as needed for wheezing or shortness of breath.   Allergy Relief 180 MG tablet Generic drug: fexofenadine TAKE 1 TABLET(180 MG) BY MOUTH DAILY   allopurinol 100 MG tablet Commonly known as: ZYLOPRIM Take 1 tablet (100 mg total) by mouth 2 (two) times daily.   amoxicillin-clavulanate 875-125 MG tablet  Commonly known as: AUGMENTIN Take 1 tablet by mouth every 12 (twelve) hours for 10 doses.   baclofen 10 MG tablet Commonly known as: LIORESAL Take 10 mg by mouth every 8 (eight) hours as needed.   blood glucose meter kit and supplies Dispense based on patient and insurance preference. Use up to four times daily as directed. (FOR ICD-10 E10.9, E11.9).   busPIRone 5 MG tablet Commonly known as: BUSPAR TAKE 1 TO 2 TABLETS BY MOUTH THREE TIMES DAILY    cyclobenzaprine 10 MG tablet Commonly known as: FLEXERIL Take 1 tablet (10 mg total) by mouth at bedtime. TAKE 1 TABLET BY MOUTH AT BEDTIME   DULoxetine 60 MG capsule Commonly known as: CYMBALTA Take 1 capsule (60 mg total) by mouth daily.   fluticasone 50 MCG/ACT nasal spray Commonly known as: FLONASE Place 2 sprays into both nostrils daily.   guaiFENesin 600 MG 12 hr tablet Commonly known as: MUCINEX Take 1 tablet (600 mg total) by mouth 2 (two) times daily for 6 days.   levothyroxine 75 MCG tablet Commonly known as: SYNTHROID Take 1 tablet (75 mcg total) by mouth daily.   Magnesium Oxide -Mg Supplement 500 MG Tabs Take 1 tablet by mouth once daily   metFORMIN 750 MG 24 hr tablet Commonly known as: GLUCOPHAGE-XR Take 1 tablet (750 mg total) by mouth daily with breakfast.   ondansetron 4 MG tablet Commonly known as: ZOFRAN Take 4 mg by mouth every 8 (eight) hours as needed.   oxyCODONE 5 MG immediate release tablet Commonly known as: Oxy IR/ROXICODONE Take 5 mg by mouth every 4 (four) hours as needed.   pregabalin 225 MG capsule Commonly known as: LYRICA Take 1 capsule (225 mg total) by mouth 2 (two) times daily.   QUEtiapine 25 MG tablet Commonly known as: SEROQUEL Take 1 tablet (25 mg total) by mouth at bedtime.   rosuvastatin 5 MG tablet Commonly known as: CRESTOR Take 1 tablet (5 mg total) by mouth daily.   Senokot S 8.6-50 MG tablet Generic drug: senna-docusate Take 2 tablets by mouth daily.   testosterone cypionate 200 MG/ML injection Commonly known as: DEPOTESTOSTERONE CYPIONATE ADMINISTER 1 ML(200 MG) IN THE MUSCLE EVERY 14 DAYS   tirzepatide 10 MG/0.5ML Pen Commonly known as: MOUNJARO Inject 10 mg into the skin once a week.   traZODone 100 MG tablet Commonly known as: DESYREL TAKE 1 & 1/2 (ONE & ONE-HALF) TABLETS BY MOUTH AT BEDTIME   valsartan 320 MG tablet Commonly known as: DIOVAN Take 1 tablet (320 mg total) by mouth daily.         Follow-up Information     [name], MD. Schedule an appointment as soon as possible for a visit in 1 week(s).   Specialty: Family Medicine Why: hospital f/u Contact information: [address] [place] [address] [phone]                 [facility] [employer]   01/04/23 2200  Weight: (!) 148.3 kg     Condition at discharge: fair  The results of significant diagnostics from this hospitalization (including imaging, microbiology, ancillary and laboratory) are listed below for reference.   Imaging Studies: US Venous Img Lower Bilateral  Result Date: 01/04/2023 CLINICAL DATA:  Bilateral leg swelling and shortness of breath with suboptimal CTA of the chest. EXAM: BILATERAL LOWER EXTREMITY VENOUS DOPPLER ULTRASOUND TECHNIQUE: Gray-scale sonography with graded compression, as well as color Doppler and duplex ultrasound were performed to evaluate the lower extremity deep  venous systems from the level of the common femoral vein and including the common femoral, femoral, profunda femoral, popliteal and calf veins including the posterior tibial, peroneal and gastrocnemius veins when visible. The superficial great saphenous vein was also interrogated. Spectral Doppler was utilized to evaluate flow at rest and with distal augmentation maneuvers in the common femoral, femoral and popliteal veins. COMPARISON:  None Available. FINDINGS: RIGHT LOWER EXTREMITY Common Femoral Vein: No evidence of thrombus. Normal compressibility, respiratory phasicity and response to augmentation. Saphenofemoral Junction: No evidence of thrombus. Normal compressibility and flow on color Doppler imaging. Profunda Femoral Vein: No evidence of thrombus. Normal compressibility and flow on color Doppler imaging. Femoral Vein: No evidence of thrombus. Normal compressibility, respiratory phasicity and response to augmentation. Popliteal Vein: No evidence of thrombus. Normal compressibility, respiratory  phasicity and response to augmentation. Calf Veins: No evidence of thrombus. Normal compressibility and flow on color Doppler imaging. Superficial Great Saphenous Vein: No evidence of thrombus. Normal compressibility. Venous Reflux:  None. Other Findings:  None. LEFT LOWER EXTREMITY Common Femoral Vein: No evidence of thrombus. Normal compressibility, respiratory phasicity and response to augmentation. Saphenofemoral Junction: No evidence of thrombus. Normal compressibility and flow on color Doppler imaging. Profunda Femoral Vein: No evidence of thrombus. Normal compressibility and flow on color Doppler imaging. Femoral Vein: No evidence of thrombus. Normal compressibility, respiratory phasicity and response to augmentation. Popliteal Vein: No evidence of thrombus. Normal compressibility, respiratory phasicity and response to augmentation. Calf Veins: No evidence of thrombus. Normal compressibility and flow on color Doppler imaging. Superficial Great Saphenous Vein: No evidence of thrombus. Normal compressibility. Venous Reflux:  None. Other Findings:  None. IMPRESSION: No evidence of deep venous thrombosis in either lower extremity. Electronically Signed   By: Inez Catalina M.D.   On: 01/04/2023 22:12   CT HEAD WO CONTRAST (5MM)  Result Date: 01/04/2023 CLINICAL DATA:  Head trauma, abnormal mental status (Age 72-64y) EXAM: CT HEAD WITHOUT CONTRAST TECHNIQUE: Contiguous axial images were obtained from the base of the skull through the vertex without intravenous contrast. RADIATION DOSE REDUCTION: This exam was performed according to the departmental dose-optimization program which includes automated exposure control, adjustment of the mA and/or kV according to patient size and/or use of iterative reconstruction technique. COMPARISON:  None Available. FINDINGS: Brain: No evidence of acute infarction, hemorrhage, hydrocephalus, extra-axial collection or mass lesion/mass effect. Vascular: No hyperdense vessel or  unexpected calcification. Skull: Normal. Negative for fracture or focal lesion. Sinuses/Orbits: No acute finding. Other: None. IMPRESSION: No acute intracranial abnormality. Electronically Signed   By: Valentino Saxon M.D.   On: 01/04/2023 19:47   CT Angio Chest PE W and/or Wo Contrast  Result Date: 01/04/2023 CLINICAL DATA:  Pulmonary embolism (PE) suspected, high prob EXAM: CT ANGIOGRAPHY CHEST WITH CONTRAST TECHNIQUE: Multidetector CT imaging of the chest was performed using the standard protocol during bolus administration of intravenous contrast. Multiplanar CT image reconstructions and MIPs were obtained to evaluate the vascular anatomy. RADIATION DOSE REDUCTION: This exam was performed according to the departmental dose-optimization program which includes automated exposure control, adjustment of the mA and/or kV according to patient size and/or use of iterative reconstruction technique. CONTRAST:  72m OMNIPAQUE IOHEXOL 350 MG/ML SOLN COMPARISON:  None Available. FINDINGS: Cardiovascular: Evaluation is limited by patient positioning, extensive motion and suboptimal contrast timing. Evaluation is particularly limited at the bases. Within these limitations, no central pulmonary embolism. Cardiomegaly. Mild atherosclerotic calcifications of the thoracic aorta. No pericardial effusion. Mediastinum/Nodes: Visualized thyroid is unremarkable. No axillary or mediastinal  adenopathy. Lungs/Pleura: No pleural effusion or pneumothorax. LEFT lower lobe and lingular heterogeneously enhancing consolidative opacity. Evaluation of the pulmonary parenchyma is limited secondary to extensive respiratory motion. Upper Abdomen: Small hiatal hernia. Status post gastric bypass surgery. Spleen measures the upper limits of normal in size Musculoskeletal: No acute osseous abnormality. Review of the MIP images confirms the above findings. IMPRESSION: 1. Limited examination secondary to respiratory motion. No central pulmonary  embolism. If persistent clinical concern, recommend dedicated lower extremity ultrasound. 2. Heterogeneously enhancing consolidative opacity within the LEFT lower lobe and lingula. This could reflect atelectasis or infection. Aortic Atherosclerosis (ICD10-I70.0). Electronically Signed   By: Valentino Saxon M.D.   On: 01/04/2023 19:45   DG Chest 2 View  Result Date: 01/04/2023 CLINICAL DATA:  Dyspnea EXAM: CHEST - 2 VIEW COMPARISON:  02/09/2009 chest radiograph. FINDINGS: Stable cardiomediastinal silhouette with normal heart size. No pneumothorax. No right pleural effusion. Probable small left pleural effusion. No overt pulmonary edema. Hazy left lung base opacity. IMPRESSION: Probable small left pleural effusion. Hazy left lung base opacity, which could represent pneumonia, aspiration or atelectasis. Follow-up chest radiographs advised to document resolution. Electronically Signed   By: Ilona Sorrel M.D.   On: 01/04/2023 17:48   MR SHOULDER LEFT WO CONTRAST  Result Date: 12/15/2022 CLINICAL DATA:  Left shoulder popping and pain EXAM: MRI OF THE LEFT SHOULDER WITHOUT CONTRAST TECHNIQUE: Multiplanar, multisequence MR imaging of the shoulder was performed. No intravenous contrast was administered. COMPARISON:  None available. FINDINGS: Rotator cuff: There is moderate distal supraspinatus and infraspinatus tendinosis. There is intermediate to high-grade rim rent type tear of the far anterior supraspinatus tendon footprint which likely reaches the anterior bursal surface (coronal T2 image 15, sagittal T2 image 16), tear measuring 1.2 cm in width. No infraspinatus tear. Moderate tendinosis of the distal subscapularis tendon. Teres minor is intact. Muscles: No significant muscle atrophy. Biceps Long Head: Moderate intra-articular long head biceps tendinosis. Acromioclavicular Joint: Moderate arthropathy of the acromioclavicular joint with joint effusion and pericapsular edema. There is a small adjacent ganglion  cyst measuring 6 mm (sagittal T2 image 10). Moderate subacromial/subdeltoid bursal fluid . Glenohumeral Joint: Trace joint fluid.  Mild chondrosis. Labrum: No evidence of labral tear. Bones: No evidence of acute fracture or dislocation. No aggressive osseous lesion. Other: No focal fluid collection. IMPRESSION: Moderate distal supraspinatus and infraspinatus tendinosis, with intermediate to high-grade rim rent type insertional tear of the far anterior supraspinatus tendon at the footprint, which likely extends to the anterior bursal surface. Tear measures 1.2 cm in width. Moderate distal subscapularis tendinosis. Moderate intra-articular long head biceps tendinosis. Mild glenohumeral and moderate AC joint osteoarthritis. AC joint effusion with pericapsular edema and small adjacent ganglion cyst measuring 6 mm. Moderate subacromial-subdeltoid bursitis. Electronically Signed   By: Maurine Simmering M.D.   On: 12/15/2022 16:40    Microbiology: Results for orders placed or performed during the hospital encounter of 01/04/23  Resp panel by RT-PCR (RSV, Flu A&B, Covid) Anterior Nasal Swab     Status: None   Collection Time: 01/04/23  6:50 PM   Specimen: Anterior Nasal Swab  Result Value Ref Range Status   SARS Coronavirus 2 by RT PCR NEGATIVE NEGATIVE Final    Comment: (NOTE) SARS-CoV-2 target nucleic acids are NOT DETECTED.  The SARS-CoV-2 RNA is generally detectable in upper respiratory specimens during the acute phase of infection. The lowest concentration of SARS-CoV-2 viral copies this assay can detect is 138 copies/mL. A negative result does not preclude SARS-Cov-2 infection and  should not be used as the sole basis for treatment or other patient management decisions. A negative result may occur with  improper specimen collection/handling, submission of specimen other than nasopharyngeal swab, presence of viral mutation(s) within the areas targeted by this assay, and inadequate number of  viral copies(<138 copies/mL). A negative result must be combined with clinical observations, patient history, and epidemiological information. The expected result is Negative.  Fact Sheet for Patients:  EntrepreneurPulse.com.au  Fact Sheet for Healthcare Providers:  IncredibleEmployment.be  This test is no t yet approved or cleared by the [place] FDA and  has been authorized for detection and/or diagnosis of SARS-CoV-2 by FDA under an Emergency Use Authorization (EUA). This EUA will remain  in effect (meaning this test can be used) for the duration of the COVID-19 declaration under Section 564(b)(1) of the Act, 21 U.S.C.section 360bbb-3(b)(1), unless the authorization is terminated  or revoked sooner.       Influenza A by PCR NEGATIVE NEGATIVE Final   Influenza B by PCR NEGATIVE NEGATIVE Final    Comment: (NOTE) The Xpert Xpress SARS-CoV-2/FLU/RSV plus assay is intended as an aid in the diagnosis of influenza from Nasopharyngeal swab specimens and should not be used as a sole basis for treatment. Nasal washings and aspirates are unacceptable for Xpert Xpress SARS-CoV-2/FLU/RSV testing.  Fact Sheet for Patients: EntrepreneurPulse.com.au  Fact Sheet for Healthcare Providers: IncredibleEmployment.be  This test is not yet approved or cleared by the [place] FDA and has been authorized for detection and/or diagnosis of SARS-CoV-2 by FDA under an Emergency Use Authorization (EUA). This EUA will remain in effect (meaning this test can be used) for the duration of the COVID-19 declaration under Section 564(b)(1) of the Act, 21 U.S.C. section 360bbb-3(b)(1), unless the authorization is terminated or revoked.     Resp Syncytial Virus by PCR NEGATIVE NEGATIVE Final    Comment: (NOTE) Fact Sheet for Patients: EntrepreneurPulse.com.au  Fact Sheet for Healthcare  Providers: IncredibleEmployment.be  This test is not yet approved or cleared by the [place] FDA and has been authorized for detection and/or diagnosis of SARS-CoV-2 by FDA under an Emergency Use Authorization (EUA). This EUA will remain in effect (meaning this test can be used) for the duration of the COVID-19 declaration under Section 564(b)(1) of the Act, 21 U.S.C. section 360bbb-3(b)(1), unless the authorization is terminated or revoked.  Performed at [facility], [address] [place], [place] [address]     Labs: CBC: Recent Labs  Lab 01/04/23 1714 01/05/23 0612  WBC 10.1 7.3  NEUTROABS 8.4*  --   HGB 13.4 11.6*  HCT 42.6 36.9*  MCV 91.6 90.4  PLT 203 562   Basic Metabolic Panel: Recent Labs  Lab 01/04/23 1714 01/05/23 0612  NA 138 137  K 4.4 4.0  CL 105 107  CO2 25 25  GLUCOSE 269* 171*  BUN 14 14  CREATININE 1.33* 1.12  CALCIUM 8.4* 7.6*   Liver Function Tests: Recent Labs  Lab 01/04/23 1850  AST 19  ALT 10  ALKPHOS 76  BILITOT 0.7  PROT 6.2*  ALBUMIN 3.6   CBG: Recent Labs  Lab 01/05/23 0805 01/05/23 1133 01/05/23 1620 01/05/23 2143 01/06/23 0820  GLUCAP 163* 153* 239* 191* 193*    Discharge time spent: greater than 30 minutes.  Signed: [name], MD [facility] 01/06/2023

## 2023-01-06 NOTE — Progress Notes (Deleted)
  SUBJECTIVE:   CHIEF COMPLAINT / HPI:   ***  PERTINENT  PMH / PSH: ***  Past Medical History:  Diagnosis Date   Allergy    Diabetes ([place])    Gout    Hiatal hernia    Hyperlipidemia    Hypertension    Hypothyroidism    Metabolic syndrome    Obesity    OSA on CPAP    11 mmhg CPAP   Radiculopathy    Seborrhea    Testicular cancer ([place])    Testicular hypofunction    Urine ketone    Vitamin D deficiency     OBJECTIVE:  There were no vitals taken for this visit. Physical Exam   ASSESSMENT/PLAN:  There are no diagnoses linked to this encounter. No follow-ups on file. Erskine Emery, MD 01/06/2023, 9:07 AM PGY-2, [place] {    This will disappear when note is signed, click to select method of visit    :1}

## 2023-01-06 NOTE — Discharge Instructions (Signed)
Smoking cessation advised Keep log of your sugars at home. Take you Diabetes meds and discuss with your PCP

## 2023-01-06 NOTE — Addendum Note (Signed)
Addended by: Matilde Sprang on: 01/06/2023 10:16 AM   Modules accepted: Orders

## 2023-01-06 NOTE — Telephone Encounter (Signed)
Medication Refill - Medication: DULoxetine (CYMBALTA) DR capsule 60 mg . Caller was advised by pharmacy medication was not there.   Has the patient contacted their pharmacy? Yes.    (Agent: If yes, when and what did the pharmacy advise?) Contact PCP office   Preferred Pharmacy (with phone number or street name):   [facility] [place]  [place], [place] [address]-  Has the patient been seen for an appointment in the last year OR does the patient have an upcoming appointment? Yes.    Agent: Please be advised that RX refills may take up to 3 business days. We ask that you follow-up with your pharmacy.

## 2023-01-06 NOTE — TOC Initial Note (Signed)
Transition of Care [facility]) - Initial/Assessment Note    Patient Details  Name: [name] MRN: [id number] Date of Birth: Jul 11, 1971  [facility]) CM/SW Contact:    Beverly Sessions, RN Phone Number: 01/06/2023, 9:09 AM  Clinical Narrative:                  Transition of Care [facility]) Screening Note   Patient Details  Name: [name] Date of Birth: 29-Feb-1971   Transition of Care [facility]) CM/SW Contact:    Beverly Sessions, RN Phone Number: 01/06/2023, 9:09 AM    Transition of Care Department [facility]) has reviewed patient and no TOC needs have been identified at this time. We will continue to monitor patient advancement through interdisciplinary progression rounds. If new patient transition needs arise, please place a TOC consult.          Patient Goals and CMS Choice            Expected Discharge Plan and Services         Expected Discharge Date: 01/06/23                                    Prior Living Arrangements/Services                       Activities of Daily Living Home Assistive Devices/Equipment: None ADL Screening (condition at time of admission) Patient's cognitive ability adequate to safely complete daily activities?: Yes Is the patient deaf or have difficulty hearing?: No Does the patient have difficulty seeing, even when wearing glasses/contacts?: No Does the patient have difficulty concentrating, remembering, or making decisions?: No Patient able to express need for assistance with ADLs?: Yes Does the patient have difficulty dressing or bathing?: Yes Independently performs ADLs?: No Communication: Independent Dressing (OT): Needs assistance Grooming: Independent Feeding: Independent Bathing: Needs assistance Toileting: Independent In/Out Bed: Independent Walks in Home: Independent Does the patient have difficulty walking or climbing stairs?: No Weakness of Legs: None Weakness of Arms/Hands:  Left  Permission Sought/Granted                  Emotional Assessment              Admission diagnosis:  Aspiration pneumonia ([place]) [J69.0] SOB (shortness of breath) [R06.02] Pneumonia of left lower lobe due to infectious organism [J18.9] Patient Active Problem List   Diagnosis Date Noted   Aspiration pneumonia ([place]) 01/04/2023   Acute respiratory failure with hypoxia ([place]) 01/04/2023   Anxiety and depression 01/04/2023   Type 2 diabetes mellitus with peripheral neuropathy ([place]) 01/04/2023   Essential hypertension 01/04/2023   Pain in joint of left shoulder 11/29/2022   Anemia, unspecified 06/12/2016   B12 deficiency 06/11/2016   Allergic conjunctivitis 06/11/2016   Muscle cramps 06/11/2016   Hypertension, benign 11/03/2015   Dyslipidemia 11/02/2015   Acquired nystagmus 08/01/2015   Chronic bilateral low back pain with bilateral sciatica 06/29/2015   Carpal tunnel syndrome 06/29/2015   Osteoarthritis 06/29/2015   Claustrophobia 06/29/2015   Foot drop, left 06/29/2015   Hiatal hernia 06/29/2015   H/O diabetes mellitus 06/29/2015   H/O testicular cancer 06/29/2015   Acquired hypothyroidism 03/50/0938   Dysmetabolic syndrome 18/29/9371   Morbid obesity ([place]) 06/29/2015   Allergic rhinitis 06/29/2015   Neuropathy 06/29/2015   Seborrheic keratoses 06/29/2015   Vitamin D deficiency 06/29/2015   Bariatric surgery  status 02/20/2012   PCP:  [name], MD Pharmacy:   [facility] [phone] - [address], [address] [place] [address] Phone: [phone] Fax: [phone]  [facility] [place], [place] - [address] [facility] [place] [address] Phone: [phone] Fax: [phone]  [place] [address]), [place] [address] Phone: [phone] Fax:  [phone]     Social Determinants of Health (SDOH) Social History: [place]: No Food Insecurity (01/04/2023)  Housing: Low Risk  (01/04/2023)  Transportation Needs: No Transportation Needs (01/04/2023)  Utilities: Not At Risk (01/04/2023)  Alcohol Screen: Low Risk  (12/03/2021)  Depression (PHQ2-9): Low Risk  (12/12/2022)  Financial Resource Strain: Low Risk  (07/24/2021)  Physical Activity: Sufficiently Active (09/16/2019)  Social Connections: Moderately Isolated (07/24/2021)  Stress: Stress Concern Present (07/24/2021)  Tobacco Use: Medium Risk (01/05/2023)   SDOH Interventions:     Readmission Risk Interventions     No data to display

## 2023-01-06 NOTE — Telephone Encounter (Signed)
Requested Prescriptions  Pending Prescriptions Disp Refills   DULoxetine (CYMBALTA) 60 MG capsule 90 capsule 1    Sig: Take 1 capsule (60 mg total) by mouth daily.     Psychiatry: Antidepressants - SNRI - duloxetine Passed - 01/06/2023 10:16 AM      Passed - Cr in normal range and within 360 days    Creat  Date Value Ref Range Status  09/12/2022 1.40 (H) 0.70 - 1.30 mg/dL Final   Creatinine, Ser  Date Value Ref Range Status  01/05/2023 1.12 0.61 - 1.24 mg/dL Final   Creatinine, Urine  Date Value Ref Range Status  09/12/2022 190 20 - 320 mg/dL Final         Passed - eGFR is 30 or above and within 360 days    GFR, Est African American  Date Value Ref Range Status  03/28/2021 75 > OR = 60 mL/min/1.28m Final   GFR, Est Non African American  Date Value Ref Range Status  03/28/2021 65 > OR = 60 mL/min/1.727mFinal   GFR, Estimated  Date Value Ref Range Status  01/05/2023 >60 >60 mL/min Final    Comment:    (NOTE) Calculated using the CKD-EPI Creatinine Equation (2021)    eGFR  Date Value Ref Range Status  09/12/2022 61 > OR = 60 mL/min/1.7387minal         Passed - Completed PHQ-2 or PHQ-9 in the last 360 days      Passed - Last BP in normal range    BP Readings from Last 1 Encounters:  01/06/23 128/87         Passed - Valid encounter within last 6 months    Recent Outpatient Visits           3 weeks ago Hypertension associated with type 2 diabetes mellitus ([facility]   1 month ago Left upper quadrant abdominal mass   [facility]   3 months ago Hypertension associated with type 2 diabetes mellitus ([facility]   6 months ago Hypertension associated with type 2 diabetes mellitus ([facility]   10 months ago Hypertension associated with type 2 diabetes mellitus ([facility]izer[facility]       Future Appointments             In 3 days SowSteele SizerD [facility] 2 months SowSteele SizerD [facility] CharleroiIn 2 months McGowan, ShaHunt OrisA-C [facility] [place]            Refused Prescriptions Disp Refills   DULoxetine (CYMBALTA) 30 MG capsule [Pharmacy Med Name: DULOXETINE DR '30MG'$  CAPSULES] 180 capsule 0    Sig: TAKE 1 CAPSULE BY MOUTH DAILY FOR 7 DAYS(1 WEEK) THEN TAKE 2 CAPSULES BY MOUTH DAILY     Psychiatry: Antidepressants - SNRI - duloxetine Passed - 01/06/2023 10:16 AM      Passed - Cr in normal range and within 360 days    Creat  Date Value Ref Range Status  09/12/2022 1.40 (H) 0.70 - 1.30 mg/dL Final   Creatinine, Ser  Date Value Ref Range Status  01/05/2023 1.12 0.61 - 1.24 mg/dL Final   Creatinine, Urine  Date Value Ref Range Status  09/12/2022 190 20 -  320 mg/dL Final         Passed - eGFR is 30 or above and within 360 days    GFR, Est African American  Date Value Ref Range Status  03/28/2021 75 > OR = 60 mL/min/1.86m Final   GFR, Est Non African American  Date Value Ref Range Status  03/28/2021 65 > OR = 60 mL/min/1.742mFinal   GFR, Estimated  Date Value Ref Range Status  01/05/2023 >60 >60 mL/min Final    Comment:    (NOTE) Calculated using the CKD-EPI Creatinine Equation (2021)    eGFR  Date Value Ref Range Status  09/12/2022 61 > OR = 60 mL/min/1.7349minal         Passed - Completed PHQ-2 or PHQ-9 in the last 360 days      Passed - Last BP in normal range    BP Readings from Last 1 Encounters:  01/06/23 128/87         Passed - Valid encounter within last 6 months    Recent Outpatient Visits           3 weeks ago Hypertension associated with type 2 diabetes mellitus ([facility]   1 month ago Left upper quadrant abdominal mass   [facility] St[facility]   3 months ago Hypertension associated with type 2 diabetes mellitus ([facility] River[facility]Drue StagerD   6 months ago Hypertension associated with type 2 diabetes mellitus ([facility] CenterwLoraineriDrue StagerD   10 months ago Hypertension associated with type 2 diabetes mellitus ([facility] SizerD       Future Appointments             In 3 days SowSteele SizerD [facility]wichIn 2 months SowSteele SizerD [facility] PartnershipECWhitesboroIn 2 months McGowan, Gladu A, PA-[place]

## 2023-01-07 ENCOUNTER — Telehealth: Payer: Self-pay

## 2023-01-07 NOTE — Telephone Encounter (Signed)
Transition Care Management Unsuccessful Follow-up Telephone Call  Date of discharge and from where:  [place] 01/06/2023  Attempts:  1st Attempt  Reason for unsuccessful TCM follow-up call:  Left voice message Juanda Crumble, [place] Direct Dial [phone]

## 2023-01-08 NOTE — Telephone Encounter (Signed)
Transition Care Management Follow-up Telephone Call Date of discharge and from where: [place] 01/06/2023 How have you been since you were released from the hospital? Still wheezing Any questions or concerns? Yes  Items Reviewed: Did the pt receive and understand the discharge instructions provided? Yes  Medications obtained and verified? Yes  Other? No  Any new allergies since your discharge? No  Dietary orders reviewed? Yes Do you have support at home? Yes   Home Care and Equipment/Supplies: Were home health services ordered? no If so, what is the name of the agency? N/a  Has the agency set up a time to come to the patient's home? no Were any new equipment or medical supplies ordered?  No What is the name of the medical supply agency? N/a Were you able to get the supplies/equipment? no Do you have any questions related to the use of the equipment or supplies? No  Functional Questionnaire: (I = Independent and D = Dependent) ADLs: I  Bathing/Dressing- I  Meal Prep- I  Eating- I  Maintaining continence- I  Transferring/Ambulation- I  Managing Meds- I  Follow up appointments reviewed:  PCP Hospital f/u appt confirmed? Yes  Scheduled to see dR Sowles on 01/09/2023 @ 9:45. [facility] f/u appt confirmed? No   Are transportation arrangements needed? No  If their condition worsens, is the pt aware to call PCP or go to the Emergency Dept.? Yes Was the patient provided with contact information for the PCP's office or ED? Yes Was to pt encouraged to call back with questions or concerns? Yes Juanda Crumble, LPN [place] Direct Dial [phone]

## 2023-01-08 NOTE — Progress Notes (Signed)
Name: [name]   MRN: [id number]    DOB: 08-14-1971   Date:01/09/2023       Progress Note  Subjective  Chief Complaint  Hospital Follow-Up  HPI  Admitted: 01/04/23 Discharged: 01/06/23  He has left shoulder rotator cuff repair with general anesthesia on January 11 th, 2024 , and one day later he developed SOB and dizziness and went to [facility] on 01/04/2023   he was found to be hypertensive and hypoxic - pulse ox 85 % upon ambulation at the [facility]. He had CTA chest that showed consolidative opacity within the left lower lobe and lingula that could reflect atelectasis or pneumonia . He was admitted given Rocephin and Azithromycin, mucolytic and nebulizer treatments. Doppler US legs negative for DVT. He was sent home on Augmentin . He states he is feeling better, no cough, he still having SOB with activity but not as severe now.   IMPRESSION:01/04/2023  1. Limited examination secondary to respiratory motion. No central pulmonary embolism. If persistent clinical concern, recommend dedicated lower extremity ultrasound. 2. Heterogeneously enhancing consolidative opacity within the LEFT lower lobe and lingula. This could reflect atelectasis or infection.   Aortic Atherosclerosis (ICD10-I70.0).   Medication reconciliation was done  DMII: glucose was not controlled during hospital stay, he never started [facility] due to upcoming surgery but he took Rybelsus up to one week ago, advised to start [facility] today   Patient Active Problem List   Diagnosis Date Noted   Aspiration pneumonia ([place]) 01/04/2023   Acute respiratory failure with hypoxia ([place]) 01/04/2023   Anxiety and depression 01/04/2023   Type 2 diabetes mellitus with peripheral neuropathy ([place]) 01/04/2023   Essential hypertension 01/04/2023   Pain in joint of left shoulder 11/29/2022   Anemia, unspecified 06/12/2016   B12 deficiency 06/11/2016   Allergic conjunctivitis 06/11/2016   Muscle cramps 06/11/2016   Hypertension, benign  11/03/2015   Dyslipidemia 11/02/2015   Acquired nystagmus 08/01/2015   Chronic bilateral low back pain with bilateral sciatica 06/29/2015   Carpal tunnel syndrome 06/29/2015   Osteoarthritis 06/29/2015   Claustrophobia 06/29/2015   Foot drop, left 06/29/2015   Hiatal hernia 06/29/2015   H/O diabetes mellitus 06/29/2015   H/O testicular cancer 06/29/2015   Acquired hypothyroidism 95/18/8416   Dysmetabolic syndrome 60/63/0160   Morbid obesity ([place]) 06/29/2015   Allergic rhinitis 06/29/2015   Neuropathy 06/29/2015   Seborrheic keratoses 06/29/2015   Vitamin D deficiency 06/29/2015   Bariatric surgery status 02/20/2012    Past Surgical History:  Procedure Laterality Date   BACK SURGERY     X 2   HERNIA REPAIR     left side and umbilical hernia repair   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   SHOULDER ARTHROSCOPY W/ ROTATOR CUFF REPAIR Left 01/02/2023   URETEROTOMY Left 12/23/1998    Family History  Problem Relation Age of Onset   Coronary artery disease Mother    Cancer Father        Melanoma and Testicular   Diabetes Father     Social History   Tobacco Use   Smoking status: Former    Packs/day: 3.00    Years: 8.00    Total pack years: 24.00    Types: Cigarettes, Cigars, Pipe    Start date: 06/28/1984    Quit date: 12/24/1991    Years since quitting: 31.0   Smokeless tobacco: Former    Types: Chew    Quit date: 12/24/1991  Substance Use Topics   Alcohol use: No  Alcohol/week: 0.0 standard drinks of alcohol     Current Outpatient Medications:    albuterol (VENTOLIN HFA) 108 (90 Base) MCG/ACT inhaler, Inhale 2 puffs into the lungs every 6 (six) hours as needed for wheezing or shortness of breath., Disp: 8 g, Rfl: 2   ALLERGY RELIEF 180 MG tablet, TAKE 1 TABLET(180 MG) BY MOUTH DAILY, Disp: 90 tablet, Rfl: 0   allopurinol (ZYLOPRIM) 100 MG tablet, Take 1 tablet (100 mg total) by mouth 2 (two) times daily., Disp: 180 tablet, Rfl: 1   amoxicillin-clavulanate  (AUGMENTIN) 875-125 MG tablet, Take 1 tablet by mouth every 12 (twelve) hours for 10 doses., Disp: 10 tablet, Rfl: 0   baclofen (LIORESAL) 10 MG tablet, Take 10 mg by mouth every 8 (eight) hours as needed., Disp: , Rfl:    busPIRone (BUSPAR) 5 MG tablet, TAKE 1 TO 2 TABLETS BY MOUTH THREE TIMES DAILY, Disp: 180 tablet, Rfl: 0   cyclobenzaprine (FLEXERIL) 10 MG tablet, Take 1 tablet (10 mg total) by mouth at bedtime. TAKE 1 TABLET BY MOUTH AT BEDTIME, Disp: 90 tablet, Rfl: 1   DULoxetine (CYMBALTA) 60 MG capsule, Take 1 capsule (60 mg total) by mouth daily., Disp: 90 capsule, Rfl: 1   fluticasone (FLONASE) 50 MCG/ACT nasal spray, Place 2 sprays into both nostrils daily., Disp: 16 g, Rfl: 6   guaiFENesin (MUCINEX) 600 MG 12 hr tablet, Take 1 tablet (600 mg total) by mouth 2 (two) times daily for 6 days., Disp: 12 tablet, Rfl: 0   levothyroxine (SYNTHROID) 75 MCG tablet, Take 1 tablet (75 mcg total) by mouth daily., Disp: 30 tablet, Rfl: 1   metFORMIN (GLUCOPHAGE-XR) 750 MG 24 hr tablet, Take 1 tablet (750 mg total) by mouth daily with breakfast., Disp: 90 tablet, Rfl: 1   ondansetron (ZOFRAN) 4 MG tablet, Take 4 mg by mouth every 8 (eight) hours as needed., Disp: , Rfl:    oxyCODONE (OXY IR/ROXICODONE) 5 MG immediate release tablet, Take 5 mg by mouth every 4 (four) hours as needed., Disp: , Rfl:    pregabalin (LYRICA) 225 MG capsule, Take 1 capsule (225 mg total) by mouth 2 (two) times daily., Disp: 180 capsule, Rfl: 1   QUEtiapine (SEROQUEL) 25 MG tablet, Take 1 tablet (25 mg total) by mouth at bedtime., Disp: 90 tablet, Rfl: 1   rosuvastatin (CRESTOR) 5 MG tablet, Take 1 tablet (5 mg total) by mouth daily., Disp: 90 tablet, Rfl: 1   testosterone cypionate (DEPOTESTOSTERONE CYPIONATE) 200 MG/ML injection, ADMINISTER 1 ML(200 MG) IN THE MUSCLE EVERY 14 DAYS, Disp: 6 mL, Rfl: 0   tirzepatide (MOUNJARO) 10 MG/0.5ML Pen, Inject 10 mg into the skin once a week., Disp: 6 mL, Rfl: 0   traZODone (DESYREL)  100 MG tablet, TAKE 1 & 1/2 (ONE & ONE-HALF) TABLETS BY MOUTH AT BEDTIME, Disp: 135 tablet, Rfl: 1   valsartan (DIOVAN) 320 MG tablet, Take 1 tablet (320 mg total) by mouth daily., Disp: 90 tablet, Rfl: 1   Magnesium Oxide 500 MG TABS, Take 1 tablet by mouth once daily (Patient not taking: Reported on 01/09/2023), Disp: 90 tablet, Rfl: 0  Allergies  Allergen Reactions   Morphine Itching    I personally reviewed active problem list, medication list, allergies, family history, social history, health maintenance with the patient/caregiver today.   ROS  Ten systems reviewed and is negative except as mentioned in HPI  Objective  Vitals:   01/09/23 0937  BP: 132/80  Pulse: 77  Resp: 16  SpO2: 98%  Weight: (!) 330 lb (149.7 kg)  Height: '5\' 11"'$  (1.803 m)    Body mass index is 46.03 kg/m.  Physical Exam  Constitutional: Patient appears well-developed and well-nourished. Obese  No distress.  HEENT: head atraumatic, normocephalic, pupils equal and reactive to light, ears normal TM, neck supple Cardiovascular: Normal rate, regular rhythm and normal heart sounds.  No murmur heard. No BLE edema. Pulmonary/Chest: Effort normal and breath sounds normal. No respiratory distress. Abdominal: Soft.  There is no tenderness. Psychiatric: Patient has a normal mood and affect. behavior is normal. Judgment and thought content normal.    PHQ2/9:    01/09/2023    9:36 AM 12/12/2022    7:49 AM 11/12/2022    2:48 PM 09/12/2022    8:22 AM 06/11/2022    2:51 PM  Depression screen PHQ 2/9  Decreased Interest 0 0 0 0 0  Down, Depressed, Hopeless 0 0 0 0 0  PHQ - 2 Score 0 0 0 0 0  Altered sleeping 3 0 0 0 3  Tired, decreased energy 0 0 0 0 0  Change in appetite 0 0 0 0 0  Feeling bad or failure about yourself  0 0 0 0 0  Trouble concentrating 0 0 0 0 0  Moving slowly or fidgety/restless 0 0 0 0 0  Suicidal thoughts 0 0 0 0 0  PHQ-9 Score 3 0 0 0 3    phq 9 is negative   Fall Risk:     01/09/2023    9:42 AM 12/12/2022    7:49 AM 11/12/2022    2:48 PM 09/12/2022    8:22 AM 06/11/2022    2:51 PM  Fall Risk   Falls in the past year? '1 1 1 '$ 0 1  Number falls in past yr: 0 0 0 0 0  Injury with Fall? '1 1 1 '$ 0 0  Risk for fall due to : No Fall Risks No Fall Risks No Fall Risks No Fall Risks No Fall Risks  Follow up Falls prevention discussed Falls prevention discussed Falls prevention discussed Falls prevention discussed Falls prevention discussed      Functional Status Survey: Is the patient deaf or have difficulty hearing?: No Does the patient have difficulty seeing, even when wearing glasses/contacts?: Yes Does the patient have difficulty concentrating, remembering, or making decisions?: Yes Does the patient have difficulty walking or climbing stairs?: Yes Does the patient have difficulty dressing or bathing?: Yes Does the patient have difficulty doing errands alone such as visiting a doctor's office or shopping?: Yes    Assessment & Plan   1. Hospital discharge follow-up  Improving clinically, normal exam   2. Aspiration pneumonia of left lower lobe due to gastric secretions ([place])  - DG Chest 2 View; Future  3. Hypertension associated with type 2 diabetes mellitus (HCC)   Gave sample of monjarno 2.5 mg to take 5 mg this week and next and bump it up to 10 mg ( rx next week) follow  diabetic diet

## 2023-01-09 ENCOUNTER — Encounter: Payer: Self-pay | Admitting: Family Medicine

## 2023-01-09 ENCOUNTER — Ambulatory Visit (INDEPENDENT_AMBULATORY_CARE_PROVIDER_SITE_OTHER): Payer: Medicare Other | Admitting: Family Medicine

## 2023-01-09 VITALS — BP 132/80 | HR 77 | Resp 16 | Ht 71.0 in | Wt 330.0 lb

## 2023-01-09 DIAGNOSIS — Z09 Encounter for follow-up examination after completed treatment for conditions other than malignant neoplasm: Secondary | ICD-10-CM | POA: Diagnosis not present

## 2023-01-09 DIAGNOSIS — E1159 Type 2 diabetes mellitus with other circulatory complications: Secondary | ICD-10-CM

## 2023-01-09 DIAGNOSIS — I152 Hypertension secondary to endocrine disorders: Secondary | ICD-10-CM | POA: Diagnosis not present

## 2023-01-09 DIAGNOSIS — J690 Pneumonitis due to inhalation of food and vomit: Secondary | ICD-10-CM

## 2023-02-25 ENCOUNTER — Other Ambulatory Visit: Payer: Self-pay | Admitting: Family Medicine

## 2023-02-25 DIAGNOSIS — E039 Hypothyroidism, unspecified: Secondary | ICD-10-CM

## 2023-03-07 ENCOUNTER — Other Ambulatory Visit: Payer: Self-pay

## 2023-03-07 DIAGNOSIS — Z8547 Personal history of malignant neoplasm of testis: Secondary | ICD-10-CM

## 2023-03-07 DIAGNOSIS — E291 Testicular hypofunction: Secondary | ICD-10-CM

## 2023-03-07 DIAGNOSIS — Z125 Encounter for screening for malignant neoplasm of prostate: Secondary | ICD-10-CM

## 2023-03-11 ENCOUNTER — Other Ambulatory Visit: Payer: Medicare Other

## 2023-03-11 DIAGNOSIS — Z8547 Personal history of malignant neoplasm of testis: Secondary | ICD-10-CM

## 2023-03-11 DIAGNOSIS — E291 Testicular hypofunction: Secondary | ICD-10-CM

## 2023-03-11 DIAGNOSIS — Z125 Encounter for screening for malignant neoplasm of prostate: Secondary | ICD-10-CM

## 2023-03-11 NOTE — Progress Notes (Unsigned)
Name: [name]   MRN: [id number]    DOB: 09/07/1971   Date:03/12/2023       Progress Note  Subjective  Chief Complaint  Follow Up  HPI  MDD recurrent/anxiety :he states as long as both of them ( wife and himself) are compliant with medications they get along well. He is no longer working, back on disability, occasionally does odds and ends type of jobs, He has been taking Seroquel, trazodone and Duloxetine and current regiment is working well for him    DMII: it was diet controlled after bariatric surgery 2013, however levels gradually went up again and we resumed Metformin followed by Rybelsus, last A1C went from  6.6 %  to 6.4 % to 6.5%, we switched from Rybelsus to [facility] in dec 2023 , his weight is down 29 lbs. . He denies polyphagia, polydipsia or polyuria.  Eye exam is up to date. BP is at goal today  . He is on Crestor daily and tolerating it well , LDL slightly above goal and we will recheck labs today    Muscle Cramps: he was doing well, he is now on crestor and seems to tolerate it better. Taking flexeril as needed and is stable on medication    Hypothyroidism: taking same dose of Synthroid for many months ,however it went very low after that very high, we adjusted the dose and last TSH was still up at 10.210, he has lost weight since last visit, and we will recheck it today. No palpitation or dysphagia. No change in bowel movement   Chronic low back pain :  Pain is about 6 /10 , stable on Lyrica and Flexeril, discussed trying otc Tylenol 500 mg max of 3 g per day to keep below toxic levels. He was on hydrocodone in 2023 when medicare stopped covering medication and pain level has been about the same    Neuropathy: he has bilateral daily pain on both legs, secondary to chronic back problems, aching like, he denies burning or prickly sensation, Lyrica helps. Pain level is up on left foot and is back on disability, Pain on foot at this time is zero / 10    History of bariatric  surgery/Morbid obesity : surgery 09/2012 was he is morbidly obese his weight at the time was 396 lbs - lowest weight after surgery was 275 lbs, he gradually gained weight back, it stabilized at the 340's, it was in the mid 330's for a while, he is now on Rybelsus , started Dec 2022 and had lost down to 306 lbs but it went up to 330 lbs, we switched on Mounjarno Dec 2023 and weight today is down to 301 lbs. He is tolerating medication well    HTN: he is taking Diovan 320 mg daily, no chest pain, palpitation, occasionally gets dizzy when he leans over for a long period of time . BP is at goal   Controlled gout; taking allopurinol , no recent episodes.  Left shoulder rotator cuff repair: his pain is gone since surgery, he missed PT but rom is good    Patient Active Problem List   Diagnosis Date Noted   Aspiration pneumonia ([place]) 01/04/2023   Acute respiratory failure with hypoxia ([place]) 01/04/2023   Anxiety and depression 01/04/2023   Type 2 diabetes mellitus with peripheral neuropathy ([place]) 01/04/2023   Essential hypertension 01/04/2023   Pain in joint of left shoulder 11/29/2022   Anemia, unspecified 06/12/2016   B12 deficiency 06/11/2016   Allergic conjunctivitis  06/11/2016   Muscle cramps 06/11/2016   Hypertension, benign 11/03/2015   Dyslipidemia 11/02/2015   Acquired nystagmus 08/01/2015   Chronic bilateral low back pain with bilateral sciatica 06/29/2015   Carpal tunnel syndrome 06/29/2015   Osteoarthritis 06/29/2015   Claustrophobia 06/29/2015   Foot drop, left 06/29/2015   Hiatal hernia 06/29/2015   H/O diabetes mellitus 06/29/2015   H/O testicular cancer 06/29/2015   Acquired hypothyroidism 123XX123   Dysmetabolic syndrome 123XX123   Morbid obesity ([place]) 06/29/2015   Allergic rhinitis 06/29/2015   Neuropathy 06/29/2015   Seborrheic keratoses 06/29/2015   Vitamin D deficiency 06/29/2015   Bariatric surgery status 02/20/2012    Past Surgical History:  Procedure  Laterality Date   BACK SURGERY     X 2   HERNIA REPAIR     left side and umbilical hernia repair   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   SHOULDER ARTHROSCOPY W/ ROTATOR CUFF REPAIR Left 01/02/2023   URETEROTOMY Left 12/23/1998    Family History  Problem Relation Age of Onset   Coronary artery disease Mother    Cancer Father        Melanoma and Testicular   Diabetes Father     Social History   Tobacco Use   Smoking status: Former    Packs/day: 3.00    Years: 8.00    Additional pack years: 0.00    Total pack years: 24.00    Types: Cigarettes, Cigars, Pipe    Start date: 06/28/1984    Quit date: 12/24/1991    Years since quitting: 31.2   Smokeless tobacco: Former    Types: Chew    Quit date: 12/24/1991  Substance Use Topics   Alcohol use: No    Alcohol/week: 0.0 standard drinks of alcohol     Current Outpatient Medications:    albuterol (VENTOLIN HFA) 108 (90 Base) MCG/ACT inhaler, Inhale 2 puffs into the lungs every 6 (six) hours as needed for wheezing or shortness of breath., Disp: 8 g, Rfl: 2   ALLERGY RELIEF 180 MG tablet, TAKE 1 TABLET(180 MG) BY MOUTH DAILY, Disp: 90 tablet, Rfl: 0   allopurinol (ZYLOPRIM) 100 MG tablet, Take 1 tablet (100 mg total) by mouth 2 (two) times daily., Disp: 180 tablet, Rfl: 1   baclofen (LIORESAL) 10 MG tablet, Take 10 mg by mouth every 8 (eight) hours as needed., Disp: , Rfl:    busPIRone (BUSPAR) 5 MG tablet, TAKE 1 TO 2 TABLETS BY MOUTH THREE TIMES DAILY, Disp: 180 tablet, Rfl: 0   cyclobenzaprine (FLEXERIL) 10 MG tablet, Take 1 tablet (10 mg total) by mouth at bedtime. TAKE 1 TABLET BY MOUTH AT BEDTIME, Disp: 90 tablet, Rfl: 1   DULoxetine (CYMBALTA) 60 MG capsule, Take 1 capsule (60 mg total) by mouth daily., Disp: 90 capsule, Rfl: 1   fluticasone (FLONASE) 50 MCG/ACT nasal spray, Place 2 sprays into both nostrils daily., Disp: 16 g, Rfl: 6   levothyroxine (SYNTHROID) 75 MCG tablet, TAKE 1 TABLET(75 MCG) BY MOUTH DAILY, Disp: 30 tablet,  Rfl: 0   Magnesium Oxide 500 MG TABS, Take 1 tablet by mouth once daily, Disp: 90 tablet, Rfl: 0   metFORMIN (GLUCOPHAGE-XR) 750 MG 24 hr tablet, Take 1 tablet (750 mg total) by mouth daily with breakfast., Disp: 90 tablet, Rfl: 1   ondansetron (ZOFRAN) 4 MG tablet, Take 4 mg by mouth every 8 (eight) hours as needed., Disp: , Rfl:    oxyCODONE (OXY IR/ROXICODONE) 5 MG immediate release tablet, Take 5 mg by  mouth every 4 (four) hours as needed., Disp: , Rfl:    pregabalin (LYRICA) 225 MG capsule, Take 1 capsule (225 mg total) by mouth 2 (two) times daily., Disp: 180 capsule, Rfl: 1   QUEtiapine (SEROQUEL) 25 MG tablet, Take 1 tablet (25 mg total) by mouth at bedtime., Disp: 90 tablet, Rfl: 1   rosuvastatin (CRESTOR) 5 MG tablet, Take 1 tablet (5 mg total) by mouth daily., Disp: 90 tablet, Rfl: 1   testosterone cypionate (DEPOTESTOSTERONE CYPIONATE) 200 MG/ML injection, ADMINISTER 1 ML(200 MG) IN THE MUSCLE EVERY 14 DAYS, Disp: 6 mL, Rfl: 0   tirzepatide (MOUNJARO) 10 MG/0.5ML Pen, Inject 10 mg into the skin once a week., Disp: 6 mL, Rfl: 0   traZODone (DESYREL) 100 MG tablet, TAKE 1 & 1/2 (ONE & ONE-HALF) TABLETS BY MOUTH AT BEDTIME, Disp: 135 tablet, Rfl: 1   valsartan (DIOVAN) 320 MG tablet, Take 1 tablet (320 mg total) by mouth daily., Disp: 90 tablet, Rfl: 1  Allergies  Allergen Reactions   Morphine Itching    I personally reviewed active problem list, medication list, allergies, family history, social history, health maintenance with the patient/caregiver today.   ROS  Constitutional: Negative for fever , positive for  weight change.  Respiratory: Negative for cough and shortness of breath.   Cardiovascular: Negative for chest pain or palpitations.  Gastrointestinal: Negative for abdominal pain, no bowel changes.  Musculoskeletal: Negative for gait problem or joint swelling.  Skin: Negative for rash.  Neurological: Negative for dizziness or headache.  No other specific complaints in  a complete review of systems (except as listed in HPI above).   Objective  Vitals:   03/12/23 0834  BP: 124/70  Pulse: 95  Resp: 16  SpO2: 95%  Weight: (!) 301 lb (136.5 kg)  Height: 6' (1.829 m)    Body mass index is 40.82 kg/m.  Physical Exam  Constitutional: Patient appears well-developed and well-nourished. Obese  No distress.  HEENT: head atraumatic, normocephalic, pupils equal and reactive to light, neck supple, Cardiovascular: Normal rate, regular rhythm and normal heart sounds.  No murmur heard. No BLE edema. Pulmonary/Chest: Effort normal and breath sounds normal. No respiratory distress. Abdominal: Soft.  There is no tenderness. Psychiatric: Patient has a normal mood and affect. behavior is normal. Judgment and thought content normal.    PHQ2/9:    03/12/2023    8:35 AM 01/09/2023    9:36 AM 12/12/2022    7:49 AM 11/12/2022    2:48 PM 09/12/2022    8:22 AM  Depression screen PHQ 2/9  Decreased Interest 0 0 0 0 0  Down, Depressed, Hopeless 0 0 0 0 0  PHQ - 2 Score 0 0 0 0 0  Altered sleeping 0 3 0 0 0  Tired, decreased energy 0 0 0 0 0  Change in appetite 0 0 0 0 0  Feeling bad or failure about yourself  0 0 0 0 0  Trouble concentrating 0 0 0 0 0  Moving slowly or fidgety/restless 0 0 0 0 0  Suicidal thoughts 0 0 0 0 0  PHQ-9 Score 0 3 0 0 0    phq 9 is negative   Fall Risk:    03/12/2023    8:35 AM 01/09/2023    9:42 AM 12/12/2022    7:49 AM 11/12/2022    2:48 PM 09/12/2022    8:22 AM  Fall Risk   Falls in the past year? 1 1 1 1  0  Number  falls in past yr: 1 0 0 0 0  Injury with Fall? 1 1 1 1  0  Risk for fall due to : No Fall Risks No Fall Risks No Fall Risks No Fall Risks No Fall Risks  Follow up Falls prevention discussed Falls prevention discussed Falls prevention discussed Falls prevention discussed Falls prevention discussed      Functional Status Survey: Is the patient deaf or have difficulty hearing?: No Does the patient have  difficulty seeing, even when wearing glasses/contacts?: No Does the patient have difficulty concentrating, remembering, or making decisions?: No Does the patient have difficulty walking or climbing stairs?: Yes Does the patient have difficulty dressing or bathing?: No Does the patient have difficulty doing errands alone such as visiting a doctor's office or shopping?: No    Assessment & Plan  1. Hypertension associated with type 2 diabetes mellitus (HCC)  - POCT HgB A1C - Lipid panel - COMPLETE METABOLIC PANEL WITH GFR - tirzepatide (MOUNJARO) 12.5 MG/0.5ML Pen; Inject 12.5 mg into the skin once a week.  Dispense: 6 mL; Refill: 0  2. B12 deficiency  - B12 and Folate Panel  3. Morbid obesity ([place])  Discussed with the patient the risk posed by an increased BMI. Discussed importance of portion control, calorie counting and at least 150 minutes of physical activity weekly. Avoid sweet beverages and drink more water. Eat at least 6 servings of fruit and vegetables daily    4. Chronic bilateral low back pain with bilateral sciatica  Stable  5. Bariatric surgery status  Losing weight again  6. Acquired hypothyroidism  - TSH  7. Dyslipidemia  - Lipid panel  8. Controlled gout  - allopurinol (ZYLOPRIM) 100 MG tablet; Take 1 tablet (100 mg total) by mouth 2 (two) times daily.  Dispense: 180 tablet; Refill: 1  9. Muscle cramps  - cyclobenzaprine (FLEXERIL) 10 MG tablet; Take 1 tablet (10 mg total) by mouth at bedtime. TAKE 1 TABLET BY MOUTH AT BEDTIME  Dispense: 90 tablet; Refill: 1  10. Anxiety  - busPIRone (BUSPAR) 5 MG tablet; TAKE 1 TO 2 TABLETS BY MOUTH THREE TIMES DAILY  Dispense: 180 tablet; Refill: 0  11. Chronic nonmalignant pain  - pregabalin (LYRICA) 225 MG capsule; Take 1 capsule (225 mg total) by mouth 2 (two) times daily.  Dispense: 180 capsule; Refill: 1  12. Hypertension, benign  - valsartan (DIOVAN) 320 MG tablet; Take 1 tablet (320 mg total) by mouth  daily.  Dispense: 90 tablet; Refill: 1  13. Major depression, recurrent, chronic (HCC)  - QUEtiapine (SEROQUEL) 25 MG tablet; Take 1 tablet (25 mg total) by mouth at bedtime.  Dispense: 90 tablet; Refill: 1

## 2023-03-12 ENCOUNTER — Encounter: Payer: Self-pay | Admitting: Family Medicine

## 2023-03-12 ENCOUNTER — Ambulatory Visit (INDEPENDENT_AMBULATORY_CARE_PROVIDER_SITE_OTHER): Payer: Medicare Other | Admitting: Family Medicine

## 2023-03-12 VITALS — BP 124/70 | HR 95 | Resp 16 | Ht 72.0 in | Wt 301.0 lb

## 2023-03-12 DIAGNOSIS — R252 Cramp and spasm: Secondary | ICD-10-CM

## 2023-03-12 DIAGNOSIS — I10 Essential (primary) hypertension: Secondary | ICD-10-CM

## 2023-03-12 DIAGNOSIS — E1159 Type 2 diabetes mellitus with other circulatory complications: Secondary | ICD-10-CM

## 2023-03-12 DIAGNOSIS — M5442 Lumbago with sciatica, left side: Secondary | ICD-10-CM | POA: Diagnosis not present

## 2023-03-12 DIAGNOSIS — I152 Hypertension secondary to endocrine disorders: Secondary | ICD-10-CM

## 2023-03-12 DIAGNOSIS — E538 Deficiency of other specified B group vitamins: Secondary | ICD-10-CM

## 2023-03-12 DIAGNOSIS — Z9884 Bariatric surgery status: Secondary | ICD-10-CM

## 2023-03-12 DIAGNOSIS — E039 Hypothyroidism, unspecified: Secondary | ICD-10-CM

## 2023-03-12 DIAGNOSIS — E785 Hyperlipidemia, unspecified: Secondary | ICD-10-CM

## 2023-03-12 DIAGNOSIS — M5441 Lumbago with sciatica, right side: Secondary | ICD-10-CM

## 2023-03-12 DIAGNOSIS — F339 Major depressive disorder, recurrent, unspecified: Secondary | ICD-10-CM

## 2023-03-12 DIAGNOSIS — M109 Gout, unspecified: Secondary | ICD-10-CM

## 2023-03-12 DIAGNOSIS — G8929 Other chronic pain: Secondary | ICD-10-CM

## 2023-03-12 DIAGNOSIS — F419 Anxiety disorder, unspecified: Secondary | ICD-10-CM

## 2023-03-12 LAB — PSA: Prostate Specific Ag, Serum: 0.5 ng/mL (ref 0.0–4.0)

## 2023-03-12 LAB — HEMOGLOBIN AND HEMATOCRIT, BLOOD
Hematocrit: 45.9 % (ref 37.5–51.0)
Hemoglobin: 14.3 g/dL (ref 13.0–17.7)

## 2023-03-12 LAB — POCT GLYCOSYLATED HEMOGLOBIN (HGB A1C): Hemoglobin A1C: 6.6 % — AB (ref 4.0–5.6)

## 2023-03-12 LAB — TESTOSTERONE: Testosterone: 410 ng/dL (ref 264–916)

## 2023-03-12 MED ORDER — BUSPIRONE HCL 5 MG PO TABS: ORAL_TABLET | ORAL | 0 refills | Status: DC

## 2023-03-12 MED ORDER — ALLOPURINOL 100 MG PO TABS: 100.0000 mg | ORAL_TABLET | Freq: Two times a day (BID) | ORAL | 1 refills | Status: DC

## 2023-03-12 MED ORDER — VALSARTAN 320 MG PO TABS: 320.0000 mg | ORAL_TABLET | Freq: Every day | ORAL | 1 refills | Status: DC

## 2023-03-12 MED ORDER — QUETIAPINE FUMARATE 25 MG PO TABS: 25.0000 mg | ORAL_TABLET | Freq: Every day | ORAL | 1 refills | Status: DC

## 2023-03-12 MED ORDER — CYCLOBENZAPRINE HCL 10 MG PO TABS: 10.0000 mg | ORAL_TABLET | Freq: Every day | ORAL | 1 refills | Status: DC

## 2023-03-12 MED ORDER — PREGABALIN 225 MG PO CAPS: 225.0000 mg | ORAL_CAPSULE | Freq: Two times a day (BID) | ORAL | 1 refills | Status: DC

## 2023-03-12 MED ORDER — TIRZEPATIDE 12.5 MG/0.5ML ~~LOC~~ SOAJ: 12.5000 mg | SUBCUTANEOUS | 0 refills | Status: DC

## 2023-03-12 NOTE — Progress Notes (Signed)
03/14/2023 9:31 AM   [name] [birth year] [id number]  Referring provider: [name], MD [address] [place],  [place] [address]  Urological history: 1. Testosterone deficiency -contributing factors of age, diabetes, sleep apnea, chronic opioid therapy and testicular cancer -testosterone level (02/2023) 410 -hemoglobin/hematocrit (02/2023) 14.3/45.9 -testosterone cypionate 200 mg/mL, 1 cc every 14 days   2. ED -contributing factors of age, BPH, testosterone deficiency, HLD, hypothyroidism, obesity, sleep apnea, chronic opioid therapy, anxiety, depression and smoking  3. BPH with LU TS -PSA (02/2023) 0.5  HPI: [name] is a 52 y.o. male who presents today for follow up.   I PSS 2/0  He has nocturia x 2.  Patient denies any modifying or aggravating factors.  Patient denies any gross hematuria, dysuria or suprapubic/flank pain.  Patient denies any fevers, chills, nausea or vomiting.     IPSS     Row Name 03/14/23 0800         International Prostate Symptom Score   How often have you had the sensation of not emptying your bladder? Not at All     How often have you had to urinate less than every two hours? Not at All     How often have you found you stopped and started again several times when you urinated? Not at All     How often have you found it difficult to postpone urination? Not at All     How often have you had a weak urinary stream? Not at All     How often have you had to strain to start urination? Not at All     How many times did you typically get up at night to urinate? 2 Times     Total IPSS Score 2       Quality of Life due to urinary symptoms   If you were to spend the rest of your life with your urinary condition just the way it is now how would you feel about that? Delighted              Score:  1-7 Mild 8-19 Moderate 20-35 Severe    SHIM 19   Patient is not having spontaneous erections.  He denies any  pain or curvature with erections.  He is not tried any treatments for ED.  He has issues with getting an erections.    SHIM     Row Name 03/14/23 0857         SHIM: Over the last 6 months:   How do you rate your confidence that you could get and keep an erection? Moderate     When you had erections with sexual stimulation, how often were your erections hard enough for penetration (entering your partner)? Sometimes (about half the time)     During sexual intercourse, how often were you able to maintain your erection after you had penetrated (entered) your partner? Sometimes (about half the time)     During sexual intercourse, how difficult was it to maintain your erection to completion of intercourse? Not Difficult     When you attempted sexual intercourse, how often was it satisfactory for you? Almost Always or Always       SHIM Total Score   SHIM 19              Score: 1-7 Severe ED 8-11 Moderate ED 12-16 Mild-Moderate ED 17-21 Mild ED 22-25 No ED    PMH: Past Medical History:  Diagnosis Date   Allergy    Diabetes ([place])    Gout    Hiatal hernia    Hyperlipidemia    Hypertension    Hypothyroidism    Metabolic syndrome    Obesity    OSA on CPAP    11 mmhg CPAP   Radiculopathy    Seborrhea    Testicular cancer (HCC)    Testicular hypofunction    Urine ketone    Vitamin D deficiency     Surgical History: Past Surgical History:  Procedure Laterality Date   BACK SURGERY     X 2   HERNIA REPAIR     left side and umbilical hernia repair   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   SHOULDER ARTHROSCOPY W/ ROTATOR CUFF REPAIR Left 01/02/2023   URETEROTOMY Left 12/23/1998    Home Medications:  Allergies as of 03/14/2023       Reactions   Morphine Itching        Medication List        Accurate as of March 14, 2023  9:31 AM. If you have any questions, ask your nurse or doctor.          albuterol 108 (90 Base) MCG/ACT inhaler Commonly known as:  VENTOLIN HFA Inhale 2 puffs into the lungs every 6 (six) hours as needed for wheezing or shortness of breath.   Allergy Relief 180 MG tablet Generic drug: fexofenadine TAKE 1 TABLET(180 MG) BY MOUTH DAILY   allopurinol 100 MG tablet Commonly known as: ZYLOPRIM Take 1 tablet (100 mg total) by mouth 2 (two) times daily.   busPIRone 5 MG tablet Commonly known as: BUSPAR TAKE 1 TO 2 TABLETS BY MOUTH THREE TIMES DAILY   cyclobenzaprine 10 MG tablet Commonly known as: FLEXERIL Take 1 tablet (10 mg total) by mouth at bedtime. TAKE 1 TABLET BY MOUTH AT BEDTIME   DULoxetine 60 MG capsule Commonly known as: CYMBALTA Take 1 capsule (60 mg total) by mouth daily.   fluticasone 50 MCG/ACT nasal spray Commonly known as: FLONASE Place 2 sprays into both nostrils daily.   levothyroxine 75 MCG tablet Commonly known as: SYNTHROID Take 1 tablet (75 mcg total) by mouth daily before breakfast.   Magnesium Oxide -Mg Supplement 500 MG Tabs Take 1 tablet by mouth once daily   metFORMIN 750 MG 24 hr tablet Commonly known as: GLUCOPHAGE-XR Take 1 tablet (750 mg total) by mouth daily with breakfast.   pregabalin 225 MG capsule Commonly known as: LYRICA Take 1 capsule (225 mg total) by mouth 2 (two) times daily.   QUEtiapine 25 MG tablet Commonly known as: SEROQUEL Take 1 tablet (25 mg total) by mouth at bedtime.   rosuvastatin 5 MG tablet Commonly known as: CRESTOR Take 1 tablet (5 mg total) by mouth daily.   sildenafil 100 MG tablet Commonly known as: VIAGRA Take 1 tablet (100 mg total) by mouth daily as needed for erectile dysfunction.   testosterone cypionate 200 MG/ML injection Commonly known as: DEPOTESTOSTERONE CYPIONATE ADMINISTER 1 ML(200 MG) IN THE MUSCLE EVERY 14 DAYS   tirzepatide 12.5 MG/0.5ML Pen Commonly known as: MOUNJARO Inject 12.5 mg into the skin once a week.   traZODone 100 MG tablet Commonly known as: DESYREL TAKE 1 & 1/2 (ONE & ONE-HALF) TABLETS BY MOUTH AT  BEDTIME   valsartan 320 MG tablet Commonly known as: DIOVAN Take 1 tablet (320 mg total) by mouth daily.        Allergies:  Allergies  Allergen Reactions  Morphine Itching    Family History: Family History  Problem Relation Age of Onset   Coronary artery disease Mother    Cancer Father        Melanoma and Testicular   Diabetes Father     Social History:  reports that he quit smoking about 31 years ago. His smoking use included cigarettes, cigars, and pipe. He started smoking about 38 years ago. He has a 24.00 pack-year smoking history. He quit smokeless tobacco use about 31 years ago.  His smokeless tobacco use included chew. He reports that he does not drink alcohol and does not use drugs.  ROS: Pertinent ROS in HPI  Physical Exam: BP 122/86   Pulse (!) 56   Ht 6' (1.829 m)   Wt 295 lb (133.8 kg)   BMI 40.01 kg/m   Constitutional:  Well nourished. Alert and oriented, No acute distress. HEENT: [place] AT, moist mucus membranes.  Trachea midline Cardiovascular: No clubbing, cyanosis, or edema. Respiratory: Normal respiratory effort, no increased work of breathing. Neurologic: Grossly intact, no focal deficits, moving all 4 extremities. Psychiatric: Normal mood and affect.  Laboratory Data: Lab Results  Component Value Date   WBC 7.3 01/05/2023   HGB 14.3 03/11/2023   HCT 45.9 03/11/2023   MCV 90.4 01/05/2023   PLT 165 01/05/2023    Lab Results  Component Value Date   CREATININE 1.13 03/12/2023    Lab Results  Component Value Date   TESTOSTERONE 410 03/11/2023    Lab Results  Component Value Date   HGBA1C 6.6 (A) 03/12/2023    Lab Results  Component Value Date   TSH 1.40 03/12/2023       Component Value Date/Time   CHOL 81 03/12/2023 0924   CHOL 103 01/11/2015 0342   HDL 30 (L) 03/12/2023 0924   HDL 26 (L) 01/11/2015 0342   CHOLHDL 2.7 03/12/2023 0924   VLDL 17 05/14/2017 1101   VLDL 19 01/11/2015 0342   LDLCALC 32 03/12/2023 0924    LDLCALC 58 01/11/2015 0342    Lab Results  Component Value Date   AST 17 03/12/2023   Lab Results  Component Value Date   ALT 12 03/12/2023  I have reviewed the labs.   Pertinent Imaging: N/A  Assessment & Plan:    1. Testosterone deficiency  -testosterone levels are therapeutic -H & H WNL -continue testosterone cypionate 200 mg/mL, 1 cc every 14 days  2. BPH with LUTS -PSA stable -symptoms - nocturia x 2  3. Erectile dysfunction:    - I explained that conditions like diabetes, hypertension, coronary artery disease, peripheral vascular disease, smoking, alcohol consumption, age, sleep apnea and BPH can diminish the ability to have an erection - We discussed trying a PDE5 inhibitor -He is agreeable confirms he does not take any nitroglycerin products -Sent in a prescription for sildenafil 100 mg on-demand dosing-advised him to take it 2 hours prior to intercourse on an empty stomach -Gave good Rx card    Return in about 1 month (around 04/14/2023) for SHIM and office visit .  These notes generated with voice recognition software. I apologize for typographical errors.  [place], [place] [address]  [place], [place] [address] [phone]

## 2023-03-13 ENCOUNTER — Other Ambulatory Visit: Payer: Self-pay | Admitting: Family Medicine

## 2023-03-13 DIAGNOSIS — E039 Hypothyroidism, unspecified: Secondary | ICD-10-CM

## 2023-03-13 LAB — COMPLETE METABOLIC PANEL WITH GFR
AG Ratio: 1.7 (calc) (ref 1.0–2.5)
ALT: 12 U/L (ref 9–46)
AST: 17 U/L (ref 10–35)
Albumin: 3.8 g/dL (ref 3.6–5.1)
Alkaline phosphatase (APISO): 114 U/L (ref 35–144)
BUN: 17 mg/dL (ref 7–25)
CO2: 28 mmol/L (ref 20–32)
Calcium: 9.1 mg/dL (ref 8.6–10.3)
Chloride: 108 mmol/L (ref 98–110)
Creat: 1.13 mg/dL (ref 0.70–1.30)
Globulin: 2.2 g/dL (calc) (ref 1.9–3.7)
Glucose, Bld: 164 mg/dL — ABNORMAL HIGH (ref 65–99)
Potassium: 5.2 mmol/L (ref 3.5–5.3)
Sodium: 143 mmol/L (ref 135–146)
Total Bilirubin: 0.7 mg/dL (ref 0.2–1.2)
Total Protein: 6 g/dL — ABNORMAL LOW (ref 6.1–8.1)
eGFR: 79 mL/min/{1.73_m2} (ref >=60)

## 2023-03-13 LAB — LIPID PANEL
Cholesterol: 81 mg/dL (ref <200)
HDL: 30 mg/dL — ABNORMAL LOW (ref >=40)
LDL Cholesterol (Calc): 32 mg/dL (calc)
Non-HDL Cholesterol (Calc): 51 mg/dL (calc) (ref <130)
Total CHOL/HDL Ratio: 2.7 (calc) (ref <5.0)
Triglycerides: 106 mg/dL (ref <150)

## 2023-03-13 LAB — B12 AND FOLATE PANEL
Folate: 8.8 ng/mL
Vitamin B-12: 221 pg/mL (ref 200–1100)

## 2023-03-13 LAB — TSH: TSH: 1.4 mIU/L (ref 0.40–4.50)

## 2023-03-13 MED ORDER — LEVOTHYROXINE SODIUM 75 MCG PO TABS: 75.0000 ug | ORAL_TABLET | Freq: Every day | ORAL | 0 refills | Status: DC

## 2023-03-14 ENCOUNTER — Ambulatory Visit (INDEPENDENT_AMBULATORY_CARE_PROVIDER_SITE_OTHER): Payer: Medicare Other | Admitting: Urology

## 2023-03-14 ENCOUNTER — Other Ambulatory Visit: Payer: Self-pay | Admitting: Urology

## 2023-03-14 ENCOUNTER — Encounter: Payer: Self-pay | Admitting: Urology

## 2023-03-14 VITALS — BP 122/86 | HR 56 | Ht 72.0 in | Wt 295.0 lb

## 2023-03-14 DIAGNOSIS — N401 Enlarged prostate with lower urinary tract symptoms: Secondary | ICD-10-CM

## 2023-03-14 DIAGNOSIS — E291 Testicular hypofunction: Secondary | ICD-10-CM

## 2023-03-14 DIAGNOSIS — N138 Other obstructive and reflux uropathy: Secondary | ICD-10-CM

## 2023-03-14 DIAGNOSIS — N529 Male erectile dysfunction, unspecified: Secondary | ICD-10-CM

## 2023-03-14 MED ORDER — SILDENAFIL CITRATE 100 MG PO TABS: 100.0000 mg | ORAL_TABLET | Freq: Every day | ORAL | 0 refills | Status: DC | PRN

## 2023-04-01 ENCOUNTER — Other Ambulatory Visit: Payer: Self-pay | Admitting: Orthopedic Surgery

## 2023-04-01 ENCOUNTER — Encounter: Payer: Self-pay | Admitting: Orthopedic Surgery

## 2023-04-01 DIAGNOSIS — S73192A Other sprain of left hip, initial encounter: Secondary | ICD-10-CM

## 2023-04-07 ENCOUNTER — Encounter: Payer: Self-pay | Admitting: Family Medicine

## 2023-04-11 ENCOUNTER — Ambulatory Visit: Payer: Medicare Other | Admitting: Urology

## 2023-04-22 ENCOUNTER — Other Ambulatory Visit: Payer: Self-pay | Admitting: Family Medicine

## 2023-04-22 DIAGNOSIS — I152 Hypertension secondary to endocrine disorders: Secondary | ICD-10-CM

## 2023-04-22 MED ORDER — TIRZEPATIDE 10 MG/0.5ML ~~LOC~~ SOAJ: 10.0000 mg | SUBCUTANEOUS | 0 refills | Status: DC

## 2023-04-29 ENCOUNTER — Inpatient Hospital Stay: Admission: RE | Admit: 2023-04-29 | Payer: Medicare Other | Source: Ambulatory Visit

## 2023-04-29 ENCOUNTER — Other Ambulatory Visit: Payer: Medicare Other

## 2023-05-01 NOTE — Progress Notes (Unsigned)
05/02/2023 8:57 PM   [name] Oct 01, 1971 [id number]  Referring provider: Alba Cory, MD [address] [place],  [place] [address]  Urological history: 1. Testosterone deficiency -contributing factors of age, diabetes, sleep apnea, chronic opioid therapy and testicular cancer -testosterone level (02/2023) 410 -hemoglobin/hematocrit (02/2023) 14.3/45.9 -testosterone cypionate 200 mg/mL, 1 cc every 14 days   2. ED -contributing factors of age, BPH, testosterone deficiency, HLD, hypothyroidism, obesity, sleep apnea, chronic opioid therapy, anxiety, depression and smoking  3. BPH with LU TS -PSA (02/2023) 0.5  HPI: [name] is a 52 y.o. male who presents today for a one month follow up after a trial of sildenafil 100 mg, on-demand-dosing with his wife, [name].    He and his wife did not feel the sildenafil gave him adequate erections for intercourse.  Patient is not having spontaneous erections.  He denies any pain or curvature with erections.    He has a friend with a IPP and he and his wife are interested.    SHIM 21   SHIM     Row Name 05/02/23 1103         SHIM: Over the last 6 months:   How do you rate your confidence that you could get and keep an erection? Low     When you had erections with sexual stimulation, how often were your erections hard enough for penetration (entering your partner)? Most Times (much more than half the time)     During sexual intercourse, how often were you able to maintain your erection after you had penetrated (entered) your partner? Almost Always or Always     During sexual intercourse, how difficult was it to maintain your erection to completion of intercourse? Not Difficult     When you attempted sexual intercourse, how often was it satisfactory for you? Almost Always or Always       SHIM Total Score   SHIM 21               Score: 1-7 Severe ED 8-11 Moderate ED 12-16 Mild-Moderate ED 17-21  Mild ED 22-25 No ED    PMH: Past Medical History:  Diagnosis Date   Allergy    Diabetes (HCC)    Gout    Hiatal hernia    Hyperlipidemia    Hypertension    Hypothyroidism    Metabolic syndrome    Obesity    OSA on CPAP    11 mmhg CPAP   Radiculopathy    Seborrhea    Testicular cancer (HCC)    Testicular hypofunction    Urine ketone    Vitamin D deficiency     Surgical History: Past Surgical History:  Procedure Laterality Date   BACK SURGERY     X 2   HERNIA REPAIR     left side and umbilical hernia repair   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 If you have any questions, ask your nurse or doctor.          albuterol 108 (90 Base) MCG/ACT inhaler Commonly known as: VENTOLIN HFA Inhale 2 puffs into the lungs every 6 (six) hours as  needed for wheezing or shortness of breath.   Allergy Relief 180 MG tablet Generic drug: fexofenadine TAKE 1 TABLET(180 MG) BY MOUTH DAILY   allopurinol 100 MG tablet Commonly known as: ZYLOPRIM Take 1 tablet (100 mg total) by mouth 2 (two) times daily.   busPIRone 5 MG tablet Commonly known as: BUSPAR TAKE 1 TO 2 TABLETS BY MOUTH THREE TIMES DAILY   cyclobenzaprine 10 MG tablet Commonly known as: FLEXERIL Take 1 tablet (10 mg total) by mouth at bedtime. TAKE 1 TABLET BY MOUTH AT BEDTIME   DULoxetine 60 MG capsule Commonly known as: CYMBALTA Take 1 capsule (60 mg total) by mouth daily.   fluticasone 50 MCG/ACT nasal spray Commonly known as: FLONASE Place 2 sprays into both nostrils daily.   levothyroxine 75 MCG tablet Commonly known as: SYNTHROID Take 1 tablet (75 mcg total) by mouth daily before breakfast.   Magnesium Oxide -Mg Supplement 500 MG Tabs Take 1 tablet  by mouth once daily   metFORMIN 750 MG 24 hr tablet Commonly known as: GLUCOPHAGE-XR Take 1 tablet (750 mg total) by mouth daily with breakfast.   pregabalin 225 MG capsule Commonly known as: LYRICA Take 1 capsule (225 mg total) by mouth 2 (two) times daily.   QUEtiapine 25 MG tablet Commonly known as: SEROQUEL Take 1 tablet (25 mg total) by mouth at bedtime.   rosuvastatin 5 MG tablet Commonly known as: CRESTOR Take 1 tablet (5 mg total) by mouth daily.   sildenafil 100 MG tablet Commonly known as: VIAGRA Take 1 tablet (100 mg total) by mouth daily as needed for erectile dysfunction.   testosterone cypionate 200 MG/ML injection Commonly known as: DEPOTESTOSTERONE CYPIONATE INJECT 1 ML INTO MUSCLE EVERY 14 DAYS   tirzepatide 10 MG/0.5ML Pen Commonly known as: MOUNJARO Inject 10 mg into the skin once a week.   traZODone 100 MG tablet Commonly known as: DESYREL TAKE 1 & 1/2 (ONE & ONE-HALF) TABLETS BY MOUTH AT BEDTIME   valsartan 320 MG tablet Commonly known as: DIOVAN Take 1 tablet (320 mg total) by mouth daily.        Allergies:  Allergies  Allergen Reactions   Morphine Itching    Family History: Family History  Problem Relation Age of Onset   Coronary artery disease Mother    Cancer Father        Melanoma and Testicular   Diabetes Father     Social History:  reports that he quit smoking about 31 years ago. His smoking use included cigarettes, cigars, and pipe. He started smoking about 38 years ago. He has a 24.00 pack-year smoking history. He quit smokeless tobacco use about 31 years ago.  His smokeless tobacco use included chew. He reports that he does not drink alcohol and does not use drugs.  ROS: Pertinent ROS in HPI  Physical Exam: BP 114/84   Pulse 93   Ht 6' (1.829 m)   Wt 294 lb (133.4 kg)   BMI 39.87 kg/m   Constitutional:  Well nourished. Alert and oriented, No acute distress. HEENT: [place] AT, moist mucus membranes.  Trachea  midline Cardiovascular: No clubbing, cyanosis, or edema. Respiratory: Normal respiratory effort, no increased work of breathing. Neurologic: Grossly intact, no focal deficits, moving all 4 extremities. Psychiatric: Normal mood and affect.   Laboratory Data: N/A  Pertinent Imaging: N/A  Assessment & Plan:    1. Testosterone deficiency  -testosterone levels and H & H  in September  2. BPH with LUTS -PSA stable -symptoms -  nocturia x 2  3. Erectile dysfunction:    - We discussed trying a  different PDE5 inhibitor, intra-urethral suppositories, intracavernous vasoactive drug injection therapy, vacuum erection devices, LI-ESWT and penile prosthesis implantation   - I demonstrated to Mr. Kerin and his wife IPP model we have in our office - he would like a referral to a high volume implanter for further information and consideration for an I PP   Return for refer to [facility] .  These notes generated with voice recognition software. I apologize for typographical errors.  Cloretta Ned  [facility] [address]  [address] [place], [place] [address] [phone]

## 2023-05-02 ENCOUNTER — Encounter: Payer: Self-pay | Admitting: Urology

## 2023-05-02 ENCOUNTER — Encounter: Payer: Self-pay | Admitting: Family Medicine

## 2023-05-02 ENCOUNTER — Ambulatory Visit (INDEPENDENT_AMBULATORY_CARE_PROVIDER_SITE_OTHER): Payer: Medicare Other | Admitting: Urology

## 2023-05-02 VITALS — BP 114/84 | HR 93 | Ht 72.0 in | Wt 294.0 lb

## 2023-05-02 DIAGNOSIS — N401 Enlarged prostate with lower urinary tract symptoms: Secondary | ICD-10-CM | POA: Diagnosis not present

## 2023-05-02 DIAGNOSIS — N529 Male erectile dysfunction, unspecified: Secondary | ICD-10-CM

## 2023-05-02 DIAGNOSIS — E291 Testicular hypofunction: Secondary | ICD-10-CM | POA: Diagnosis not present

## 2023-05-02 DIAGNOSIS — N138 Other obstructive and reflux uropathy: Secondary | ICD-10-CM

## 2023-05-04 ENCOUNTER — Telehealth: Payer: Self-pay | Admitting: Urology

## 2023-05-04 NOTE — Telephone Encounter (Signed)
[name] will need an appointment in September for PSA, testosterone, hemoglobin, hematocrit and I PSS.

## 2023-05-05 NOTE — Telephone Encounter (Signed)
Patient notified and appointments made. °

## 2023-05-21 ENCOUNTER — Encounter: Payer: Self-pay | Admitting: Family Medicine

## 2023-06-13 ENCOUNTER — Ambulatory Visit: Payer: Medicare Other | Admitting: Family Medicine

## 2023-06-17 ENCOUNTER — Observation Stay: Payer: Medicare Other

## 2023-06-17 ENCOUNTER — Inpatient Hospital Stay
Admission: EM | Admit: 2023-06-17 | Discharge: 2023-06-19 | DRG: 683 | Disposition: A | Discharge status: Home / Self Care | Payer: Medicare Other | Attending: Internal Medicine | Admitting: Internal Medicine

## 2023-06-17 ENCOUNTER — Emergency Department: Payer: Medicare Other

## 2023-06-17 ENCOUNTER — Other Ambulatory Visit: Payer: Self-pay

## 2023-06-17 ENCOUNTER — Encounter: Payer: Self-pay | Admitting: Emergency Medicine

## 2023-06-17 DIAGNOSIS — E66813 Obesity, class 3: Secondary | ICD-10-CM | POA: Diagnosis present

## 2023-06-17 DIAGNOSIS — W19XXXA Unspecified fall, initial encounter: Secondary | ICD-10-CM | POA: Diagnosis present

## 2023-06-17 DIAGNOSIS — M25562 Pain in left knee: Secondary | ICD-10-CM | POA: Diagnosis present

## 2023-06-17 DIAGNOSIS — I951 Orthostatic hypotension: Secondary | ICD-10-CM | POA: Diagnosis present

## 2023-06-17 DIAGNOSIS — Z79899 Other long term (current) drug therapy: Secondary | ICD-10-CM

## 2023-06-17 DIAGNOSIS — N179 Acute kidney failure, unspecified: Principal | ICD-10-CM | POA: Diagnosis present

## 2023-06-17 DIAGNOSIS — G8929 Other chronic pain: Secondary | ICD-10-CM | POA: Diagnosis present

## 2023-06-17 DIAGNOSIS — Z833 Family history of diabetes mellitus: Secondary | ICD-10-CM

## 2023-06-17 DIAGNOSIS — E785 Hyperlipidemia, unspecified: Secondary | ICD-10-CM | POA: Diagnosis present

## 2023-06-17 DIAGNOSIS — Z9884 Bariatric surgery status: Secondary | ICD-10-CM

## 2023-06-17 DIAGNOSIS — E860 Dehydration: Secondary | ICD-10-CM | POA: Diagnosis present

## 2023-06-17 DIAGNOSIS — R55 Syncope and collapse: Secondary | ICD-10-CM | POA: Diagnosis not present

## 2023-06-17 DIAGNOSIS — G4733 Obstructive sleep apnea (adult) (pediatric): Secondary | ICD-10-CM

## 2023-06-17 DIAGNOSIS — F32A Depression, unspecified: Secondary | ICD-10-CM | POA: Diagnosis present

## 2023-06-17 DIAGNOSIS — M549 Dorsalgia, unspecified: Secondary | ICD-10-CM | POA: Diagnosis present

## 2023-06-17 DIAGNOSIS — Z7985 Long-term (current) use of injectable non-insulin antidiabetic drugs: Secondary | ICD-10-CM

## 2023-06-17 DIAGNOSIS — E039 Hypothyroidism, unspecified: Secondary | ICD-10-CM | POA: Diagnosis present

## 2023-06-17 DIAGNOSIS — Z79890 Hormone replacement therapy (postmenopausal): Secondary | ICD-10-CM

## 2023-06-17 DIAGNOSIS — I10 Essential (primary) hypertension: Secondary | ICD-10-CM | POA: Diagnosis present

## 2023-06-17 DIAGNOSIS — Z87891 Personal history of nicotine dependence: Secondary | ICD-10-CM

## 2023-06-17 DIAGNOSIS — I959 Hypotension, unspecified: Secondary | ICD-10-CM

## 2023-06-17 DIAGNOSIS — R0902 Hypoxemia: Secondary | ICD-10-CM | POA: Diagnosis present

## 2023-06-17 DIAGNOSIS — Z808 Family history of malignant neoplasm of other organs or systems: Secondary | ICD-10-CM

## 2023-06-17 DIAGNOSIS — Z6841 Body Mass Index (BMI) 40.0 and over, adult: Secondary | ICD-10-CM

## 2023-06-17 DIAGNOSIS — Z8547 Personal history of malignant neoplasm of testis: Secondary | ICD-10-CM

## 2023-06-17 DIAGNOSIS — E1142 Type 2 diabetes mellitus with diabetic polyneuropathy: Secondary | ICD-10-CM | POA: Diagnosis present

## 2023-06-17 DIAGNOSIS — Z7984 Long term (current) use of oral hypoglycemic drugs: Secondary | ICD-10-CM

## 2023-06-17 DIAGNOSIS — Z8249 Family history of ischemic heart disease and other diseases of the circulatory system: Secondary | ICD-10-CM

## 2023-06-17 DIAGNOSIS — F419 Anxiety disorder, unspecified: Secondary | ICD-10-CM | POA: Diagnosis present

## 2023-06-17 DIAGNOSIS — M109 Gout, unspecified: Secondary | ICD-10-CM | POA: Diagnosis present

## 2023-06-17 LAB — CBC
HCT: 52.6 % — ABNORMAL HIGH (ref 39.0–52.0)
Hemoglobin: 16.8 g/dL (ref 13.0–17.0)
MCH: 28.8 pg (ref 26.0–34.0)
MCHC: 31.9 g/dL (ref 30.0–36.0)
MCV: 90.1 fL (ref 80.0–100.0)
Platelets: 196 10*3/uL (ref 150–400)
RBC: 5.84 MIL/uL — ABNORMAL HIGH (ref 4.22–5.81)
RDW: 14.6 % (ref 11.5–15.5)
WBC: 10.6 10*3/uL — ABNORMAL HIGH (ref 4.0–10.5)
nRBC: 0 % (ref 0.0–0.2)

## 2023-06-17 LAB — TSH: TSH: 8.432 u[IU]/mL — ABNORMAL HIGH (ref 0.350–4.500)

## 2023-06-17 LAB — BASIC METABOLIC PANEL
Anion gap: 11 (ref 5–15)
BUN: 44 mg/dL — ABNORMAL HIGH (ref 6–20)
CO2: 25 mmol/L (ref 22–32)
Calcium: 8.5 mg/dL — ABNORMAL LOW (ref 8.9–10.3)
Chloride: 98 mmol/L (ref 98–111)
Creatinine, Ser: 3.41 mg/dL — ABNORMAL HIGH (ref 0.61–1.24)
GFR, Estimated: 21 mL/min — ABNORMAL LOW (ref >60)
Glucose, Bld: 129 mg/dL — ABNORMAL HIGH (ref 70–99)
Potassium: 4.7 mmol/L (ref 3.5–5.1)
Sodium: 134 mmol/L — ABNORMAL LOW (ref 135–145)

## 2023-06-17 LAB — HEPATIC FUNCTION PANEL
ALT: 15 U/L (ref 0–44)
AST: 23 U/L (ref 15–41)
Albumin: 3.8 g/dL (ref 3.5–5.0)
Alkaline Phosphatase: 101 U/L (ref 38–126)
Bilirubin, Direct: 0.2 mg/dL (ref 0.0–0.2)
Indirect Bilirubin: 1.1 mg/dL — ABNORMAL HIGH (ref 0.3–0.9)
Total Bilirubin: 1.3 mg/dL — ABNORMAL HIGH (ref 0.3–1.2)
Total Protein: 6.4 g/dL — ABNORMAL LOW (ref 6.5–8.1)

## 2023-06-17 LAB — URINALYSIS, ROUTINE W REFLEX MICROSCOPIC
Bilirubin Urine: NEGATIVE
Glucose, UA: NEGATIVE mg/dL
Hgb urine dipstick: NEGATIVE
Ketones, ur: 5 mg/dL — AB
Leukocytes,Ua: NEGATIVE
Nitrite: NEGATIVE
Protein, ur: NEGATIVE mg/dL
Specific Gravity, Urine: 1.018 (ref 1.005–1.030)
pH: 5 (ref 5.0–8.0)

## 2023-06-17 LAB — T4, FREE: Free T4: 0.65 ng/dL (ref 0.61–1.12)

## 2023-06-17 LAB — CBG MONITORING, ED: Glucose-Capillary: 209 mg/dL — ABNORMAL HIGH (ref 70–99)

## 2023-06-17 LAB — TROPONIN I (HIGH SENSITIVITY): Troponin I (High Sensitivity): 9 ng/L (ref <18)

## 2023-06-17 LAB — D-DIMER, QUANTITATIVE: D-Dimer, Quant: <0.27 ug/mL-FEU (ref 0.00–0.50)

## 2023-06-17 LAB — MAGNESIUM: Magnesium: 2.4 mg/dL (ref 1.7–2.4)

## 2023-06-17 MED ORDER — ACETAMINOPHEN 325 MG PO TABS
650.0000 mg | ORAL_TABLET | Freq: Four times a day (QID) | ORAL | Status: DC | PRN
  Administered 2023-06-18: 650 mg via ORAL
  Filled 2023-06-17: qty 2

## 2023-06-17 MED ORDER — SODIUM CHLORIDE 0.9 % IV BOLUS: 1000.0000 mL | Freq: Once | INTRAVENOUS | Status: DC

## 2023-06-17 MED ORDER — SODIUM CHLORIDE 0.9 % IV SOLN: INTRAVENOUS | Status: AC

## 2023-06-17 MED ORDER — MIDODRINE HCL 5 MG PO TABS
10.0000 mg | ORAL_TABLET | Freq: Once | ORAL | Status: AC
  Administered 2023-06-17: 10 mg via ORAL
  Filled 2023-06-17: qty 2

## 2023-06-17 MED ORDER — ENOXAPARIN SODIUM 80 MG/0.8ML IJ SOSY
65.0000 mg | PREFILLED_SYRINGE | INTRAMUSCULAR | Status: DC
  Administered 2023-06-17 – 2023-06-18 (×2): 65 mg via SUBCUTANEOUS
  Filled 2023-06-17 (×2): qty 0.65

## 2023-06-17 MED ORDER — ROSUVASTATIN CALCIUM 10 MG PO TABS
5.0000 mg | ORAL_TABLET | Freq: Every day | ORAL | Status: DC
  Administered 2023-06-18: 5 mg via ORAL
  Filled 2023-06-17: qty 1

## 2023-06-17 MED ORDER — SODIUM CHLORIDE 0.9 % IV BOLUS: 500.0000 mL | Freq: Once | INTRAVENOUS | Status: DC

## 2023-06-17 MED ORDER — ONDANSETRON HCL 4 MG/2ML IJ SOLN: 4.0000 mg | Freq: Four times a day (QID) | INTRAMUSCULAR | Status: DC | PRN

## 2023-06-17 MED ORDER — SODIUM CHLORIDE 0.9 % IV BOLUS
500.0000 mL | Freq: Once | INTRAVENOUS | Status: AC
  Administered 2023-06-17: 500 mL via INTRAVENOUS

## 2023-06-17 MED ORDER — SODIUM CHLORIDE 0.9 % IV BOLUS
1000.0000 mL | Freq: Once | INTRAVENOUS | Status: AC
  Administered 2023-06-17: 1000 mL via INTRAVENOUS

## 2023-06-17 MED ORDER — SODIUM CHLORIDE 0.9% FLUSH
3.0000 mL | Freq: Two times a day (BID) | INTRAVENOUS | Status: DC
  Administered 2023-06-17 – 2023-06-18 (×3): 3 mL via INTRAVENOUS

## 2023-06-17 MED ORDER — ENOXAPARIN SODIUM 40 MG/0.4ML IJ SOSY
40.0000 mg | PREFILLED_SYRINGE | INTRAMUSCULAR | Status: DC
Start: 2023-06-17 — End: 2023-06-17

## 2023-06-17 MED ORDER — INSULIN ASPART 100 UNIT/ML IJ SOLN
0.0000 [IU] | Freq: Every day | INTRAMUSCULAR | Status: DC
  Administered 2023-06-17: 2 [IU] via SUBCUTANEOUS
  Filled 2023-06-17: qty 1

## 2023-06-17 MED ORDER — QUETIAPINE FUMARATE 25 MG PO TABS
25.0000 mg | ORAL_TABLET | Freq: Every day | ORAL | Status: DC
  Administered 2023-06-17 – 2023-06-18 (×2): 25 mg via ORAL
  Filled 2023-06-17 (×2): qty 1

## 2023-06-17 MED ORDER — ACETAMINOPHEN 650 MG RE SUPP: 650.0000 mg | Freq: Four times a day (QID) | RECTAL | Status: DC | PRN

## 2023-06-17 MED ORDER — SODIUM CHLORIDE 0.9 % IV SOLN: Freq: Once | INTRAVENOUS | Status: AC

## 2023-06-17 MED ORDER — ONDANSETRON HCL 4 MG PO TABS: 4.0000 mg | ORAL_TABLET | Freq: Four times a day (QID) | ORAL | Status: DC | PRN

## 2023-06-17 MED ORDER — INSULIN ASPART 100 UNIT/ML IJ SOLN
0.0000 [IU] | Freq: Three times a day (TID) | INTRAMUSCULAR | Status: DC
  Administered 2023-06-18: 7 [IU] via SUBCUTANEOUS
  Filled 2023-06-17: qty 1

## 2023-06-17 MED ORDER — DULOXETINE HCL 30 MG PO CPEP
60.0000 mg | ORAL_CAPSULE | Freq: Every day | ORAL | Status: DC
  Administered 2023-06-18: 60 mg via ORAL
  Filled 2023-06-17: qty 1

## 2023-06-17 MED ORDER — MIDODRINE HCL 5 MG PO TABS
10.0000 mg | ORAL_TABLET | Freq: Three times a day (TID) | ORAL | Status: DC
  Administered 2023-06-18 (×2): 10 mg via ORAL
  Filled 2023-06-17 (×2): qty 2

## 2023-06-17 MED ORDER — LEVOTHYROXINE SODIUM 50 MCG PO TABS
75.0000 ug | ORAL_TABLET | Freq: Every day | ORAL | Status: DC
  Administered 2023-06-17 – 2023-06-18 (×2): 75 ug via ORAL
  Filled 2023-06-17: qty 2
  Filled 2023-06-17: qty 1

## 2023-06-17 NOTE — ED Notes (Signed)
Assumed care of patient , placed in gown

## 2023-06-17 NOTE — Assessment & Plan Note (Signed)
CPAP nightly

## 2023-06-17 NOTE — Assessment & Plan Note (Addendum)
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

## 2023-06-17 NOTE — ED Provider Notes (Signed)
[facility] Provider Note    Event Date/Time   First MD Initiated Contact with Patient 06/17/23 1755     (approximate)   History   Chief Complaint Loss of Consciousness   HPI  [name] is a 52 y.o. male with past medical history of hypertension, diabetes, hypothyroidism, and gout who presents to the ED complaining of syncope.  Patient reports that he has been feeling dizzy and lightheaded for the past couple of days, especially with position changes and when he goes to bend over.  Symptoms seem worse today and he reports passing out 2 separate times, each when he went to stand up from a sitting position.  He states he fell forward onto his back, denies hitting his head and denies any headache or neck pain.  He has not had any associated chest pain or shortness of breath.  He states he is otherwise felt well with no fevers, cough, nausea, vomiting, diarrhea, or dysuria.     Physical Exam   Triage Vital Signs: ED Triage Vitals  Enc Vitals Group     BP 06/17/23 1708 (!) 63/56     Pulse Rate 06/17/23 1702 85     Resp 06/17/23 1702 19     Temp 06/17/23 1702 98.3 F (36.8 C)     Temp Source 06/17/23 1702 Oral     SpO2 06/17/23 1702 97 %     Weight 06/17/23 1709 289 lb (131.1 kg)     Height 06/17/23 1709 5\' 9"  (1.753 m)     Head Circumference --      Peak Flow --      Pain Score 06/17/23 1709 4     Pain Loc --      Pain Edu? --      Excl. in GC? --     Most recent vital signs: Vitals:   06/17/23 1851 06/17/23 1900  BP: 92/71 107/74  Pulse:  73  Resp:  19  Temp:    SpO2:  98%    Constitutional: Alert and oriented. Eyes: Conjunctivae are normal. Head: Atraumatic. Nose: No congestion/rhinnorhea. Mouth/Throat: Mucous membranes are moist.  Cardiovascular: Normal rate, regular rhythm. Grossly normal heart sounds.  2+ radial pulses bilaterally. Respiratory: Normal respiratory effort.  No retractions. Lungs CTAB. Gastrointestinal: Soft  and nontender. No distention. Musculoskeletal: No lower extremity tenderness nor edema.  Neurologic:  Normal speech and language. No gross focal neurologic deficits are appreciated.    ED Results / Procedures / Treatments   Labs (all labs ordered are listed, but only abnormal results are displayed) Labs Reviewed  BASIC METABOLIC PANEL - Abnormal; Notable for the following components:      Result Value   Sodium 134 (*)    Glucose, Bld 129 (*)    BUN 44 (*)    Creatinine, Ser 3.41 (*)    Calcium 8.5 (*)    GFR, Estimated 21 (*)    All other components within normal limits  CBC - Abnormal; Notable for the following components:   WBC 10.6 (*)    RBC 5.84 (*)    HCT 52.6 (*)    All other components within normal limits  HEPATIC FUNCTION PANEL - Abnormal; Notable for the following components:   Total Protein 6.4 (*)    Total Bilirubin 1.3 (*)    Indirect Bilirubin 1.1 (*)    All other components within normal limits  TSH - Abnormal; Notable for the following components:   TSH 8.432 (*)  All other components within normal limits  MAGNESIUM  T4, FREE  URINALYSIS, ROUTINE W REFLEX MICROSCOPIC  CBG MONITORING, ED  TROPONIN I (HIGH SENSITIVITY)     EKG  ED ECG REPORT I, Chesley Noon, the attending physician, personally viewed and interpreted this ECG.   Date: 06/17/2023  EKG Time: 17:07  Rate: 86  Rhythm: normal sinus rhythm  Axis: Normal  Intervals:none  ST&T Change: None  RADIOLOGY Chest x-ray reviewed and interpreted by me with no infiltrate, edema, or effusion.  PROCEDURES:  Critical Care performed: Yes, see critical care procedure note(s)  .Critical Care  Performed by: Chesley Noon, MD Authorized by: Chesley Noon, MD   Critical care provider statement:    Critical care time (minutes):  30   Critical care time was exclusive of:  Separately billable procedures and treating other patients and teaching time   Critical care was necessary to treat or  prevent imminent or life-threatening deterioration of the following conditions:  Shock and renal failure   Critical care was time spent personally by me on the following activities:  Development of treatment plan with patient or surrogate, discussions with consultants, evaluation of patient's response to treatment, examination of patient, ordering and review of laboratory studies, ordering and review of radiographic studies, ordering and performing treatments and interventions, pulse oximetry, re-evaluation of patient's condition and review of old charts   I assumed direction of critical care for this patient from another provider in my specialty: no     Care discussed with: admitting provider      MEDICATIONS ORDERED IN ED: Medications  sodium chloride 0.9 % bolus 1,000 mL (0 mLs Intravenous Stopped 06/17/23 1800)  0.9 %  sodium chloride infusion (0 mLs Intravenous Stopped 06/17/23 1850)     IMPRESSION / MDM / ASSESSMENT AND PLAN / ED COURSE  I reviewed the triage vital signs and the nursing notes.                              52 y.o. male with past medical history of hypertension, diabetes, hypothyroidism, gout, and OSA who presents to the ED complaining of multiple syncopal episodes today when rising to a standing position.  Patient's presentation is most consistent with acute presentation with potential threat to life or bodily function.  Differential diagnosis includes, but is not limited to, arrhythmia, ACS, dehydration, vasovagal episode, orthostatic hypotension, AKI, electrolyte abnormality, sepsis.  Patient nontoxic-appearing and in no acute distress, vital signs remarkable for hypotension but otherwise reassuring.  Patient denies any infectious symptoms and with otherwise reassuring vital signs, I have low suspicion for sepsis.  We will aggressively hydrate with IV fluids with 2 L of normal saline, blood pressure already appears to be improving with MAP greater than 65.  EKG without  evidence of arrhythmia or ischemia.  Labs thus far show significant AKI without acute electrolyte abnormality, no significant anemia or leukocytosis noted.  We will screen for infectious process with chest x-ray and urinalysis.  We will add on LFTs, thyroid studies, and magnesium level.  Additional labs are reassuring, troponin and magnesium within normal limits, LFTs are unremarkable.  Blood pressure improving following IV fluid hydration, case discussed with hospitalist for admission.      FINAL CLINICAL IMPRESSION(S) / ED DIAGNOSES   Final diagnoses:  Syncope and collapse  Hypotension, unspecified hypotension type  AKI (acute kidney injury) (HCC)     Rx / DC Orders   ED Discharge  Orders     None        Note:  This document was prepared using Dragon voice recognition software and may include unintentional dictation errors.   Chesley Noon, MD 06/17/23 [phone]

## 2023-06-17 NOTE — Assessment & Plan Note (Addendum)
TSH elevated but T4 within normal limits Continue levothyroxine

## 2023-06-17 NOTE — Assessment & Plan Note (Signed)
Creatinine about 3.  Baseline 1.13 Possibly related to high-dose Diovan 320 in combination with metformin  No reported vomiting or diarrhea to suspect fluid losses Hold Diovan and metformin IV hydration and monitor renal function and avoid nephrotoxins

## 2023-06-17 NOTE — Assessment & Plan Note (Signed)
Currently on pregabalin, duloxetine and cyclobenzaprine Will hold on minimize dose as able given syncopal events

## 2023-06-17 NOTE — H&P (Signed)
History and Physical    Patient: [name] ZOX:[id number] DOB: [birth year] DOA: 06/17/2023 DOS: the patient was seen and examined on 06/17/2023 PCP: Alba Cory, MD  Patient coming from: Home  Chief Complaint:  Chief Complaint  Patient presents with   Loss of Consciousness    HPI: [name] is a 52 y.o. male with medical history significant for  hypothyroidism, HTN, gout, DM morbid obesity on Mounjaro with history of bariatric surgery 2013, OSA on CPAP, anxiety and depression as well as chronic back pain and neuropathy, on multiple meds, hospitalized in January with aspiration pneumonia with hypoxia following left rotator cuff surgery who presents to the ED following a syncopal episode x 2.  Patient reports a several day history of dizziness and lightheadedness mostly when he bends forward or changes position from a sitting to standing up position but otherwise feels in his usual state of health.  Denies recent illness, no recent nausea, vomiting or diarrhea.  No cough fever chills, chest pain, shortness of breath, palpitations.  Patient has prior history of dizziness with standing but has never been checked out. ED course and data review: BP 63/56 on arrival, fluid responsive to 107/74 with otherwise normal vitalsLabs most notable for creatinine of 3.41 up from a baseline of 1.13 about 3 months ago.  BUN 44 WBC 10,600 hemoglobin 16.8.  Troponin 9, TSH 8.43 with normal T4. Urinalysis pending. EKG, personally viewed and interpreted shows NSR at 86 with no concerning ST-T wave changes. Chest x-ray with no active cardiopulmonary disease Patient was treated with a 2 L NS bolus with good BP response.  Patient remained asymptomatic in the ED.  Hospitalist consulted for admission.   Review of Systems: As mentioned in the history of present illness. All other systems reviewed and are negative.  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 His smoking use included cigarettes, cigars, and pipe. He started smoking about 38 years ago. He has a 24.00 pack-year smoking history. He quit smokeless tobacco use about 31 years ago.  His smokeless tobacco use included chew. He reports that he does not drink alcohol and does not use drugs.  Allergies  Allergen Reactions   Morphine Itching    Family History  Problem Relation Age of Onset   Coronary artery disease Mother    Cancer Father        Melanoma and Testicular   Diabetes Father     Prior to Admission medications   Medication Sig Start Date End Date Taking? Authorizing Provider  albuterol (VENTOLIN HFA) 108 (90 Base) MCG/ACT inhaler Inhale 2 puffs into the lungs every 6 (six) hours  as needed for wheezing or shortness of breath. 01/06/23   Enedina Finner, MD  ALLERGY RELIEF 180 MG tablet TAKE 1 TABLET(180 MG) BY MOUTH DAILY 11/14/21   Alba Cory, MD  allopurinol (ZYLOPRIM) 100 MG tablet Take 1 tablet (100 mg total) by mouth 2 (two) times daily. 03/12/23   Alba Cory, MD  busPIRone (BUSPAR) 5 MG tablet TAKE 1 TO 2 TABLETS BY MOUTH THREE TIMES DAILY 03/12/23   Alba Cory, MD  cyclobenzaprine (FLEXERIL) 10 MG tablet Take 1 tablet (10 mg total) by mouth at bedtime. TAKE 1 TABLET BY MOUTH AT BEDTIME 03/12/23   Alba Cory, MD  DULoxetine (CYMBALTA) 60 MG capsule Take 1 capsule (60 mg  total) by mouth daily. 01/06/23   Alba Cory, MD  fluticasone (FLONASE) 50 MCG/ACT nasal spray Place 2 sprays into both nostrils daily. 11/12/22   Alba Cory, MD  levothyroxine (SYNTHROID) 75 MCG tablet Take 1 tablet (75 mcg total) by mouth daily before breakfast. 03/13/23   Alba Cory, MD  Magnesium Oxide 500 MG TABS Take 1 tablet by mouth once daily 06/03/22   Alba Cory, MD  metFORMIN (GLUCOPHAGE-XR) 750 MG 24 hr tablet Take 1 tablet (750 mg total) by mouth daily with breakfast. 12/09/22   Alba Cory, MD  pregabalin (LYRICA) 225 MG capsule Take 1 capsule (225 mg total) by mouth 2 (two) times daily. 03/12/23   Alba Cory, MD  QUEtiapine (SEROQUEL) 25 MG tablet Take 1 tablet (25 mg total) by mouth at bedtime. 03/12/23   Alba Cory, MD  rosuvastatin (CRESTOR) 5 MG tablet Take 1 tablet (5 mg total) by mouth daily. 12/09/22   Alba Cory, MD  sildenafil (VIAGRA) 100 MG tablet Take 1 tablet (100 mg total) by mouth daily as needed for erectile dysfunction. 03/14/23   Michiel Cowboy A, PA-C  testosterone cypionate (DEPOTESTOSTERONE CYPIONATE) 200 MG/ML injection INJECT 1 ML INTO MUSCLE EVERY 14 DAYS 03/14/23   McGowan, Carollee Herter A, PA-C  tirzepatide [facility]) 10 MG/0.5ML Pen Inject 10 mg into the skin once a week. 04/22/23   Alba Cory, MD  traZODone (DESYREL) 100 MG tablet TAKE 1 & 1/2 (ONE & ONE-HALF) TABLETS BY MOUTH AT BEDTIME 12/12/22   Alba Cory, MD  valsartan (DIOVAN) 320 MG tablet Take 1 tablet (320 mg total) by mouth daily. 03/12/23   Alba Cory, MD    Physical Exam: Vitals:   06/17/23 1815 06/17/23 1830 06/17/23 1851 06/17/23 1900  BP: (!) 77/56 (!) 77/64 92/71 107/74  Pulse: 71 69  73  Resp: 15 16  19   Temp:      TempSrc:      SpO2: 100% 94%  98%  Weight:      Height:       Physical Exam Vitals and nursing note reviewed.  Constitutional:      General: He is not in acute distress. HENT:     Head: Normocephalic and atraumatic.   Cardiovascular:     Rate and Rhythm: Normal rate and regular rhythm.     Heart sounds: Normal heart sounds.  Pulmonary:     Effort: Pulmonary effort is normal.     Breath sounds: Normal breath sounds.  Abdominal:     Palpations: Abdomen is soft.     Tenderness: There is no abdominal tenderness.  Neurological:     Mental Status: Mental status is at baseline.     Labs on Admission: I have personally reviewed following labs and imaging studies  CBC: Recent Labs  Lab 06/17/23 1718  WBC 10.6*  HGB 16.8  HCT 52.6*  MCV 90.1  PLT 196   Basic Metabolic Panel: Recent Labs  Lab 06/17/23 1718  NA 134*  K 4.7  CL 98  CO2 25  GLUCOSE 129*  BUN 44*  CREATININE 3.41*  CALCIUM 8.5*  MG 2.4   GFR: Estimated Creatinine Clearance: 34.4 mL/min (A) (by C-G formula based on SCr of 3.41 mg/dL (H)). Liver Function Tests: Recent Labs  Lab 06/17/23 1718  AST 23  ALT 15  ALKPHOS 101  BILITOT 1.3*  PROT 6.4*  ALBUMIN 3.8   No results for input(s): "LIPASE", "AMYLASE" in the last 168 hours. No results for input(s): "AMMONIA" in the last 168 hours. Coagulation Profile: No results for input(s): "INR", "PROTIME" in the last 168 hours. Cardiac Enzymes: No results for input(s): "CKTOTAL", "CKMB", "CKMBINDEX", "TROPONINI" in the last 168 hours. BNP (last 3 results) No results for input(s): "PROBNP" in the last 8760 hours. HbA1C: No results for input(s): "HGBA1C" in the last 72 hours. CBG: No results for input(s): "GLUCAP" in the last 168 hours. Lipid Profile: No results for input(s): "CHOL", "HDL", "LDLCALC", "TRIG", "CHOLHDL", "LDLDIRECT" in the last 72 hours. Thyroid Function Tests: Recent Labs    06/17/23 1718  TSH 8.432*  FREET4 0.65   Anemia Panel: No results for input(s): "VITAMINB12", "FOLATE", "FERRITIN", "TIBC", "IRON", "RETICCTPCT" in the last 72 hours. Urine analysis:    Component Value Date/Time   COLORURINE YELLOW 12/25/2009 1639   APPEARANCEUR Clear  10/10/2022 1423   LABSPEC 1.030 12/25/2009 1639   PHURINE 5.0 12/25/2009 1639   GLUCOSEU Negative 10/10/2022 1423   HGBUR NEGATIVE 12/25/2009 1639   BILIRUBINUR Negative 10/10/2022 1423   KETONESUR NEGATIVE 12/25/2009 1639   PROTEINUR Negative 10/10/2022 1423   PROTEINUR NEGATIVE 12/25/2009 1639   UROBILINOGEN 0.2 12/25/2009 1639   NITRITE Negative 10/10/2022 1423   NITRITE NEGATIVE 12/25/2009 1639   LEUKOCYTESUR Negative 10/10/2022 1423    Radiological Exams on Admission: DG Chest 2 View  Result Date: 06/17/2023 CLINICAL DATA:  Weakness. EXAM: CHEST - 2 VIEW COMPARISON:  January 04, 2023. FINDINGS: Stable cardiomediastinal silhouette. Both lungs are clear. The visualized skeletal structures are unremarkable. IMPRESSION: No active cardiopulmonary disease. Electronically Signed   By: Lupita Raider M.D.   On: 06/17/2023 18:54     Data Reviewed: Relevant notes from primary care and specialist visits, past discharge summaries as available in EHR, including Care Everywhere. Prior diagnostic testing as pertinent to current admission diagnoses Updated medications and problem lists for reconciliation ED course, including vitals, labs, imaging, treatment and response to treatment Triage notes, nursing and pharmacy notes and ED provider's notes Notable results as noted in HPI   Assessment and Plan: * Syncope and collapse Hypotension Polypharmacy Patient presents with recurrent dizziness with position change and 2 syncopal events today No stigmata of infection or acute neurologic condition.  No vomiting or diarrhea Suspecting orthostatic hypotension in part related to medication as patient is on multiple psychoactive meds including  buspirone, cyclobenzaprine, quetiapine, high-dose pregabalin and trazodone Possible dysautonomia related to diabetes Nonetheless we will get syncope workup in view of multiple risk factors History of surgery several months ago so will get D-dimer and pursue  CTA chest if concerning (though might be affected by renal function) Continuous cardiac monitoring,  Follow echo and carotid Doppler Follow D-dimer and urinalysis IV hydration  Addendum: Patient remained very hypotensive in spite of fluid boluses although he was mentating well.  D-dimer returned negative.  Urinalysis unremarkable.  Carotid Doppler with less than 49% stenosis.  CT head ordered and pending ICU consult was requested for possible circulatory shock of unknown etiology.  Discussed with Cheryll Cockayne , NP however patient's BP started to recover and ICU consult canceled.  Workup and findings discussed with patient and wife at bedside.   AKI (acute kidney injury) (HCC) Creatinine about 3.  Baseline 1.13 Possibly related to high-dose Diovan 320 in combination with metformin  No reported vomiting or diarrhea to suspect fluid losses Hold Diovan and metformin IV hydration and monitor renal function and avoid nephrotoxins  Hypotension Essential hypertension BP was 63/56 on arrival, fluid responsive to 107/74 Possibly orthostatic hypotension versus medication related or combination of both Hold antihypertensives, hydrate and resume as appropriate  Chronic pain Currently on pregabalin, duloxetine and cyclobenzaprine Will hold on minimize dose as able given syncopal events  Type 2 diabetes mellitus with peripheral neuropathy (HCC) Hold metformin due to AKI Sliding scale insulin coverage  Anxiety and depression Continue Cymbalta, BuSpar.  Hold trazodone if able  OSA on CPAP CPAP nightly  Obesity, Class III, BMI 40-49.9 (morbid obesity) (HCC) S/p bariatric surgery 2013 Chronic Mounjaro use Complicating factor to overall prognosis and care  Acquired hypothyroidism TSH elevated but T4 within normal limits Continue levothyroxine    DVT prophylaxis: Lovenox  Consults: none  Advance Care Planning:   Code Status: Prior   Family Communication: wife at bedside  Disposition  Plan: Back to previous home environment  Severity of Illness: The appropriate patient status for this patient is OBSERVATION. Observation status is judged to be reasonable and necessary in order to provide the required intensity of service to ensure the patient's safety. The patient's presenting symptoms, physical exam findings, and initial radiographic and laboratory data in the context of their medical condition is felt to place them at decreased risk for further clinical deterioration. Furthermore, it is anticipated that the patient will be medically stable for discharge from the hospital within 2 midnights of admission.   Marland KitchenCRITICAL CARE Performed by: Andris Baumann   Total critical care time: 120 minutes  Critical care time was exclusive of separately billable procedures and treating other patients.  Critical care was necessary to treat or prevent imminent or life-threatening deterioration.  Critical care was time spent personally by me on the following activities: development of treatment plan with patient and/or surrogate as well as nursing, discussions with consultants, evaluation of patient's response to treatment, examination of patient, obtaining history from patient or surrogate, ordering and performing treatments and interventions, ordering and review of laboratory studies, ordering and review of radiographic studies, pulse oximetry and re-evaluation of patient's condition.    Author: Andris Baumann, MD 06/17/2023 7:32 PM  For on call review [email or web address].

## 2023-06-17 NOTE — Assessment & Plan Note (Signed)
Continue home meds: Cymbalta, BuSpar, Seroquel, trazodone

## 2023-06-17 NOTE — Progress Notes (Signed)
PHARMACIST - PHYSICIAN COMMUNICATION  CONCERNING:  Enoxaparin (Lovenox) for DVT Prophylaxis    RECOMMENDATION: Patient was prescribed enoxaprin 40mg  q24 hours for VTE prophylaxis.   Filed Weights   06/17/23 1709  Weight: 131.1 kg (289 lb)    Body mass index is 42.68 kg/m.  Estimated Creatinine Clearance: 34.4 mL/min (A) (by C-G formula based on SCr of 3.41 mg/dL (H)).   Based on [facility] policy patient is candidate for enoxaparin 0.5mg /kg TBW SQ every 24 hours based on BMI being >30.  DESCRIPTION: Pharmacy has adjusted enoxaparin dose per [facility] policy.  Patient is now receiving enoxaparin 0.5 mg/kg every 24 hours    Lowella Bandy, PharmD Clinical Pharmacist  06/17/2023 8:11 PM

## 2023-06-17 NOTE — ED Notes (Signed)
Pt states he had another "passing out spell." Pt vitals taken and relayed to Dr. Para March.

## 2023-06-17 NOTE — ED Notes (Signed)
Pt had hit call bell. Writer went into the room and asked what is wrong.  Pt was up right and alert and oriented when entering the room. Visitor had stated "he had passed out againPatent attorney asked for more details of this incident. Visitor stated " he just started twitching a little bit and then a big twitch, he then woke up and stated he had passed out again." MD and RN notified

## 2023-06-17 NOTE — Assessment & Plan Note (Signed)
Hold metformin due to AKI Sliding scale insulin coverage

## 2023-06-17 NOTE — ED Triage Notes (Signed)
Pt to ED via POV. Pt states that he has passed out twice today. Pt states that he is passing out when he is going from sitting to standing. Pt denies being sick recently. Pt states that he has had episode like this in the past but has never been evaluated. Pt denies cardiac history. Pt states that his vision is blurry. Pt is hypotensive in triage at 63/48

## 2023-06-17 NOTE — ED Notes (Signed)
Patient aware of need for urine unable to provide at this time

## 2023-06-17 NOTE — Assessment & Plan Note (Addendum)
Hypotension Polypharmacy Patient presents with recurrent dizziness with position change and 2 syncopal events today.  He had prodromal symptoms of feeling dizzy, lightheaded and vision darkening before episodes. No stigmata of infection or acute neurologic condition.   No vomiting or diarrhea Suspecting orthostatic hypotension / dehydration, possibly psychoactive meds contributing, cannot rule out possible dysautonomia related to diabetes. History of surgery several months ago with negative D-dimer - rules out VTE U/S carotids no significant stenosis. No events on telemetry monitoring Echo with ED 50-55%, grade I diastolic dysfunction --Orthostatic vitals normal --Hold valsartan --Treated and improved with IV fluids

## 2023-06-17 NOTE — Assessment & Plan Note (Signed)
Hx of Essential hypertension BP was 63/56 on arrival, fluid responsive to 107/74.  Possibly orthostatic hypotension versus medication related or combination of both --Pt was continued on IV fluids & BP's improved. --Hold Diovan -- BP's normal / controlled with it held --Home BP monitoring --Close PCP follow up

## 2023-06-18 ENCOUNTER — Observation Stay (HOSPITAL_COMMUNITY)
Admit: 2023-06-18 | Discharge: 2023-06-18 | Disposition: A | Discharge status: Home / Self Care | Payer: Medicare Other | Attending: Internal Medicine | Admitting: Internal Medicine

## 2023-06-18 DIAGNOSIS — E860 Dehydration: Secondary | ICD-10-CM | POA: Diagnosis present

## 2023-06-18 DIAGNOSIS — M549 Dorsalgia, unspecified: Secondary | ICD-10-CM | POA: Diagnosis present

## 2023-06-18 DIAGNOSIS — R55 Syncope and collapse: Secondary | ICD-10-CM | POA: Diagnosis present

## 2023-06-18 DIAGNOSIS — G8929 Other chronic pain: Secondary | ICD-10-CM | POA: Diagnosis present

## 2023-06-18 DIAGNOSIS — E1142 Type 2 diabetes mellitus with diabetic polyneuropathy: Secondary | ICD-10-CM | POA: Diagnosis present

## 2023-06-18 DIAGNOSIS — M109 Gout, unspecified: Secondary | ICD-10-CM | POA: Diagnosis present

## 2023-06-18 DIAGNOSIS — E039 Hypothyroidism, unspecified: Secondary | ICD-10-CM | POA: Diagnosis present

## 2023-06-18 DIAGNOSIS — I10 Essential (primary) hypertension: Secondary | ICD-10-CM | POA: Diagnosis present

## 2023-06-18 DIAGNOSIS — N179 Acute kidney failure, unspecified: Secondary | ICD-10-CM | POA: Diagnosis present

## 2023-06-18 DIAGNOSIS — W19XXXA Unspecified fall, initial encounter: Secondary | ICD-10-CM | POA: Diagnosis present

## 2023-06-18 DIAGNOSIS — Z8547 Personal history of malignant neoplasm of testis: Secondary | ICD-10-CM | POA: Diagnosis not present

## 2023-06-18 DIAGNOSIS — E785 Hyperlipidemia, unspecified: Secondary | ICD-10-CM | POA: Diagnosis present

## 2023-06-18 DIAGNOSIS — I951 Orthostatic hypotension: Secondary | ICD-10-CM | POA: Diagnosis present

## 2023-06-18 DIAGNOSIS — R0902 Hypoxemia: Secondary | ICD-10-CM | POA: Diagnosis present

## 2023-06-18 DIAGNOSIS — F419 Anxiety disorder, unspecified: Secondary | ICD-10-CM | POA: Diagnosis present

## 2023-06-18 DIAGNOSIS — Z79899 Other long term (current) drug therapy: Secondary | ICD-10-CM | POA: Diagnosis not present

## 2023-06-18 DIAGNOSIS — F32A Depression, unspecified: Secondary | ICD-10-CM | POA: Diagnosis present

## 2023-06-18 DIAGNOSIS — M25562 Pain in left knee: Secondary | ICD-10-CM | POA: Diagnosis present

## 2023-06-18 DIAGNOSIS — Z79890 Hormone replacement therapy (postmenopausal): Secondary | ICD-10-CM | POA: Diagnosis not present

## 2023-06-18 DIAGNOSIS — Z6841 Body Mass Index (BMI) 40.0 and over, adult: Secondary | ICD-10-CM | POA: Diagnosis not present

## 2023-06-18 DIAGNOSIS — G4733 Obstructive sleep apnea (adult) (pediatric): Secondary | ICD-10-CM | POA: Diagnosis present

## 2023-06-18 DIAGNOSIS — Z87891 Personal history of nicotine dependence: Secondary | ICD-10-CM | POA: Diagnosis not present

## 2023-06-18 DIAGNOSIS — Z9884 Bariatric surgery status: Secondary | ICD-10-CM | POA: Diagnosis not present

## 2023-06-18 DIAGNOSIS — Z7985 Long-term (current) use of injectable non-insulin antidiabetic drugs: Secondary | ICD-10-CM | POA: Diagnosis not present

## 2023-06-18 DIAGNOSIS — Z7984 Long term (current) use of oral hypoglycemic drugs: Secondary | ICD-10-CM | POA: Diagnosis not present

## 2023-06-18 LAB — CBC
HCT: 42.7 % (ref 39.0–52.0)
Hemoglobin: 13.8 g/dL (ref 13.0–17.0)
MCH: 29.4 pg (ref 26.0–34.0)
MCHC: 32.3 g/dL (ref 30.0–36.0)
MCV: 90.9 fL (ref 80.0–100.0)
Platelets: 134 10*3/uL — ABNORMAL LOW (ref 150–400)
RBC: 4.7 MIL/uL (ref 4.22–5.81)
RDW: 14.6 % (ref 11.5–15.5)
WBC: 7.8 10*3/uL (ref 4.0–10.5)
nRBC: 0 % (ref 0.0–0.2)

## 2023-06-18 LAB — ECHOCARDIOGRAM COMPLETE
AR max vel: 2.66 cm2
AV Area VTI: 2.97 cm2
AV Area mean vel: 2.58 cm2
AV Mean grad: 4 mmHg
AV Peak grad: 7.6 mmHg
Ao pk vel: 1.38 m/s
Area-P 1/2: 2.62 cm2
Height: 69 in
MV VTI: 3.57 cm2
S' Lateral: 3.6 cm
Weight: 4624 oz

## 2023-06-18 LAB — GLUCOSE, CAPILLARY: Glucose-Capillary: 106 mg/dL — ABNORMAL HIGH (ref 70–99)

## 2023-06-18 LAB — BASIC METABOLIC PANEL
Anion gap: 6 (ref 5–15)
BUN: 40 mg/dL — ABNORMAL HIGH (ref 6–20)
CO2: 25 mmol/L (ref 22–32)
Calcium: 7.4 mg/dL — ABNORMAL LOW (ref 8.9–10.3)
Chloride: 108 mmol/L (ref 98–111)
Creatinine, Ser: 2.01 mg/dL — ABNORMAL HIGH (ref 0.61–1.24)
GFR, Estimated: 39 mL/min — ABNORMAL LOW (ref >60)
Glucose, Bld: 100 mg/dL — ABNORMAL HIGH (ref 70–99)
Potassium: 4 mmol/L (ref 3.5–5.1)
Sodium: 139 mmol/L (ref 135–145)

## 2023-06-18 LAB — CBG MONITORING, ED
Glucose-Capillary: 117 mg/dL — ABNORMAL HIGH (ref 70–99)
Glucose-Capillary: 142 mg/dL — ABNORMAL HIGH (ref 70–99)
Glucose-Capillary: 107 mg/dL — ABNORMAL HIGH (ref 70–99)
Glucose-Capillary: 211 mg/dL — ABNORMAL HIGH (ref 70–99)

## 2023-06-18 MED ORDER — TRAZODONE HCL 50 MG PO TABS
150.0000 mg | ORAL_TABLET | Freq: Every evening | ORAL | Status: DC | PRN
  Administered 2023-06-18: 150 mg via ORAL
  Filled 2023-06-18: qty 3

## 2023-06-18 MED ORDER — BUSPIRONE HCL 10 MG PO TABS
5.0000 mg | ORAL_TABLET | Freq: Three times a day (TID) | ORAL | Status: DC
  Administered 2023-06-18 (×2): 5 mg via ORAL
  Filled 2023-06-18 (×2): qty 1

## 2023-06-18 MED ORDER — SODIUM CHLORIDE 0.9 % IV SOLN: INTRAVENOUS | Status: DC

## 2023-06-18 MED ORDER — PREGABALIN 75 MG PO CAPS
225.0000 mg | ORAL_CAPSULE | Freq: Two times a day (BID) | ORAL | Status: DC
  Administered 2023-06-18 (×2): 225 mg via ORAL
  Filled 2023-06-18: qty 1
  Filled 2023-06-18: qty 3

## 2023-06-18 MED ORDER — CYCLOBENZAPRINE HCL 10 MG PO TABS
10.0000 mg | ORAL_TABLET | Freq: Every day | ORAL | Status: DC
  Administered 2023-06-18: 10 mg via ORAL
  Filled 2023-06-18: qty 1

## 2023-06-18 NOTE — Progress Notes (Addendum)
Progress Note   Patient: [name]:[id number] DOB: [birth year] DOA: 06/17/2023     0 DOS: the patient was seen and examined on 06/18/2023   Brief hospital course: HPI on admission 06/17/23 by Dr. Para March: "[name] is a 52 y.o. male with medical history significant for  hypothyroidism, HTN, gout, DM morbid obesity on Mounjaro with history of bariatric surgery 2013, OSA on CPAP, anxiety and depression as well as chronic back pain and neuropathy, on multiple meds, hospitalized in January with aspiration pneumonia with hypoxia following left rotator cuff surgery who presents to the ED following a syncopal episode x 2.  Patient reports a several day history of dizziness and lightheadedness mostly when he bends forward or changes position from a sitting to standing up position but otherwise feels in his usual state of health.  Denies recent illness, no recent nausea, vomiting or diarrhea.  No cough fever chills, chest pain, shortness of breath, palpitations.  Patient has prior history of dizziness with standing but has never been checked out. ED course and data review: BP 63/56 on arrival, fluid responsive to 107/74 with otherwise normal vitalsLabs most notable for creatinine of 3.41 up from a baseline of 1.13 about 3 months ago.  BUN 44 WBC 10,600 hemoglobin 16.8.  Troponin 9, TSH 8.43 with normal T4. Urinalysis pending. EKG, personally viewed and interpreted shows NSR at 86 with no concerning ST-T wave changes. Chest x-ray with no active cardiopulmonary disease Patient was treated with a 2 L NS bolus with good BP response.  Patient remained asymptomatic in the ED.  Hospitalist consulted for admission. "    Assessment and Plan: * Syncope and collapse Hypotension Polypharmacy Patient presents with recurrent dizziness with position change and 2 syncopal events today.  He had prodromal symptoms of feeling dizzy, lightheaded and vision darkening before episodes. No stigmata of infection  or acute neurologic condition.   No vomiting or diarrhea Suspecting orthostatic hypotension / dehydration, possibly psychoactive meds contributing, cannot rule out possible dysautonomia related to diabetes. History of surgery several months ago with negative D-dimer - rules out VTE U/S carotids no significant stenosis. --Continuous cardiac monitoring,  --Follow echo - pending --Continue IV hydration --Orthostatic vitals qshift --Hold valsartan   AKI (acute kidney injury) (HCC) Cr 3.41 on admission, baseline 1.13 Due to pre-renal azotemia from hypotension, high-dose Diovan 320 in combination with metformin, dehydration from working out in the heat, excessive sweating.  No reported vomiting or diarrhea.  --Hold Diovan and metformin --IV hydration  --monitor renal function  --avoid nephrotoxins  Hypotension Hx of Essential hypertension BP was 63/56 on arrival, fluid responsive to 107/74.  Possibly orthostatic hypotension versus medication related or combination of both --Hold antihypertensives --IV fluids to hydrate  --resume antihypertensives when indicated  Essential hypertension Holding Diovan due to hypotension on admission and AKI. Will add PRN agent if SBP's > 150  Polypharmacy Noted.  Monitor for signs of over-sedation and adjust meds accordingly.  Chronic pain Continue home pregabalin, duloxetine and cyclobenzaprine  Type 2 diabetes mellitus with peripheral neuropathy (HCC) Hold metformin due to AKI Sliding scale insulin coverage  Anxiety and depression Continue home meds: Cymbalta, BuSpar, Seroquel, trazodone  OSA on CPAP CPAP nightly  Obesity, Class III, BMI 40-49.9 (morbid obesity) (HCC) S/p bariatric surgery 2013 Chronic Mounjaro use Complicates overall care and prognosis.  Recommend lifestyle modifications including physical activity and diet for weight loss and overall long-term health.   Acquired hypothyroidism TSH elevated but T4 within normal  limits --  Continue levothyroxine  Bariatric surgery status Noted. No acute issues.        Subjective: Pt seen in the ED holding for a bed this AM, wife/so at bedside.  Pt feeling a bit better today. Was able to ambulate to the bathroom.  He describes getting dizzy and lightheaded, not room spinning. No chest pain or palpitations.  Has been working in the heat, feels like he was pushing fluids okay.  No other acute complaints except knee pain from when he passed out & landed on it.  Physical Exam: Vitals:   06/18/23 1126 06/18/23 1130 06/18/23 1145 06/18/23 1200  BP: 118/88 123/86  (!) 122/95  Pulse: 62 64  62  Resp: 15 18 15 14   Temp:      TempSrc:      SpO2: 94%  95% 96%  Weight:      Height:       General exam: awake, alert, no acute distress, obese HEENT: atraumatic, clear conjunctiva, anicteric sclera, moist mucus membranes, hearing grossly normal  Respiratory system: CTAB, no wheezes, rales or rhonchi, normal respiratory effort. Cardiovascular system: normal S1/S2, RRR, no JVD, murmurs, rubs, gallops, no pedal edema.   Gastrointestinal system: soft, NT, ND, no HSM felt, +bowel sounds. Central nervous system: A&O x4. no gross focal neurologic deficits, normal speech Extremities: moves all, no edema, normal tone Skin: dry, intact, normal temperature, normal color, No rashes, lesions or ulcers Psychiatry: normal mood, congruent affect, judgement and insight appear normal  Data Reviewed:  Notable labs --- Cr improved 3.41 >> 2.01, glucose 100, BUN 40, Ca 7.4, platelets 134k D-dimer negative < 0.27  Family Communication: at bedside on rounds  Disposition: Status is: Inpatient Remains inpatient appropriate because: Remains admitted due to persistent AKI requiring further IV fluids.   Ongoing syncope evaluation and monitoring    Planned Discharge Destination: Home    Time spent: 44 minutes  Author: Pennie Banter, DO 06/18/2023 1:45 PM  For on call review  [email or web address].

## 2023-06-18 NOTE — Assessment & Plan Note (Signed)
Holding Diovan due to hypotension on admission and AKI. Will add PRN agent if SBP's > 150

## 2023-06-18 NOTE — ED Notes (Signed)
Pt asked for soda; given diet soda. Visitor at bedside.

## 2023-06-18 NOTE — Assessment & Plan Note (Signed)
Noted.  No acute issues. 

## 2023-06-18 NOTE — ED Notes (Signed)
Pt received lunch tray before BG taken. Pt didn't eat anything from tray except a few pieces of pineapple stating "I don't like spaghetti".

## 2023-06-18 NOTE — Hospital Course (Signed)
HPI on admission 06/17/23 by Dr. Para March: "[name] is a 52 y.o. male with medical history significant for  hypothyroidism, HTN, gout, DM morbid obesity on Mounjaro with history of bariatric surgery 2013, OSA on CPAP, anxiety and depression as well as chronic back pain and neuropathy, on multiple meds, hospitalized in January with aspiration pneumonia with hypoxia following left rotator cuff surgery who presents to the ED following a syncopal episode x 2.  Patient reports a several day history of dizziness and lightheadedness mostly when he bends forward or changes position from a sitting to standing up position but otherwise feels in his usual state of health.  Denies recent illness, no recent nausea, vomiting or diarrhea.  No cough fever chills, chest pain, shortness of breath, palpitations.  Patient has prior history of dizziness with standing but has never been checked out. ED course and data review: BP 63/56 on arrival, fluid responsive to 107/74 with otherwise normal vitalsLabs most notable for creatinine of 3.41 up from a baseline of 1.13 about 3 months ago.  BUN 44 WBC 10,600 hemoglobin 16.8.  Troponin 9, TSH 8.43 with normal T4. Urinalysis pending. EKG, personally viewed and interpreted shows NSR at 86 with no concerning ST-T wave changes. Chest x-ray with no active cardiopulmonary disease Patient was treated with a 2 L NS bolus with good BP response.  Patient remained asymptomatic in the ED.  Hospitalist consulted for admission. "

## 2023-06-18 NOTE — ED Notes (Signed)
This RN to bedside to inform pt of PRN tylenol, pt asleep at this time. WCTM.

## 2023-06-18 NOTE — ED Notes (Signed)
BG 107

## 2023-06-18 NOTE — ED Notes (Signed)
Pt sitting calmly on bed, skin dry and resp reg/unlabored. Pt using personal phone and is in NAD. Visitor at bedside.

## 2023-06-18 NOTE — ED Notes (Signed)
Pt on call light, this RN to bedside. Pt requesting blankets, blankets provided. Pt endorsing knee pain and requesting pain medication, will check MAR.

## 2023-06-18 NOTE — ED Notes (Signed)
Dietary states they're backed up and that they haven't fixed this pt's breakfast tray yet but are getting to it as quickly as they can.  

## 2023-06-18 NOTE — ED Notes (Signed)
ECHO staff at bedside. Will complete orthostatic vitals once they're done.

## 2023-06-18 NOTE — Progress Notes (Signed)
Pt does not want to wear CPAP as ordered. Machine is not in room. Pt encouraged to call if needed.

## 2023-06-18 NOTE — Progress Notes (Signed)
*  PRELIMINARY RESULTS* Echocardiogram 2D Echocardiogram has been performed.  Carolyne Fiscal 06/18/2023, 12:39 PM

## 2023-06-18 NOTE — Assessment & Plan Note (Signed)
Noted.  Monitor for signs of over-sedation and adjust meds accordingly.

## 2023-06-18 NOTE — ED Notes (Signed)
Pt denies dizziness, nausea or pain during orthostatic vital sign process.

## 2023-06-19 DIAGNOSIS — R55 Syncope and collapse: Secondary | ICD-10-CM | POA: Diagnosis not present

## 2023-06-19 LAB — CBC
HCT: 42.9 % (ref 39.0–52.0)
Hemoglobin: 13.6 g/dL (ref 13.0–17.0)
MCH: 28.9 pg (ref 26.0–34.0)
MCHC: 31.7 g/dL (ref 30.0–36.0)
MCV: 91.3 fL (ref 80.0–100.0)
Platelets: 133 10*3/uL — ABNORMAL LOW (ref 150–400)
RBC: 4.7 MIL/uL (ref 4.22–5.81)
RDW: 14.6 % (ref 11.5–15.5)
WBC: 5.7 10*3/uL (ref 4.0–10.5)
nRBC: 0 % (ref 0.0–0.2)

## 2023-06-19 LAB — COMPREHENSIVE METABOLIC PANEL
ALT: 12 U/L (ref 0–44)
AST: 15 U/L (ref 15–41)
Albumin: 2.8 g/dL — ABNORMAL LOW (ref 3.5–5.0)
Alkaline Phosphatase: 84 U/L (ref 38–126)
Anion gap: 3 — ABNORMAL LOW (ref 5–15)
BUN: 25 mg/dL — ABNORMAL HIGH (ref 6–20)
CO2: 26 mmol/L (ref 22–32)
Calcium: 7.8 mg/dL — ABNORMAL LOW (ref 8.9–10.3)
Chloride: 110 mmol/L (ref 98–111)
Creatinine, Ser: 1.25 mg/dL — ABNORMAL HIGH (ref 0.61–1.24)
GFR, Estimated: >60 mL/min (ref >60)
Glucose, Bld: 112 mg/dL — ABNORMAL HIGH (ref 70–99)
Potassium: 4.7 mmol/L (ref 3.5–5.1)
Sodium: 139 mmol/L (ref 135–145)
Total Bilirubin: 0.9 mg/dL (ref 0.3–1.2)
Total Protein: 4.9 g/dL — ABNORMAL LOW (ref 6.5–8.1)

## 2023-06-19 LAB — MAGNESIUM: Magnesium: 2.2 mg/dL (ref 1.7–2.4)

## 2023-06-19 LAB — GLUCOSE, CAPILLARY: Glucose-Capillary: 114 mg/dL — ABNORMAL HIGH (ref 70–99)

## 2023-06-19 NOTE — Progress Notes (Signed)
Mobility Specialist - Progress Note   06/19/23 1001  Mobility  Activity Ambulated independently in hallway;Stood at bedside;Dangled on edge of bed  Level of Assistance Independent  Assistive Device None  Distance Ambulated (ft) 120 ft  Activity Response Tolerated well  Mobility Referral Yes  $Mobility charge 1 Mobility  Mobility Specialist Start Time (ACUTE ONLY) 0933  Mobility Specialist Stop Time (ACUTE ONLY) 0941  Mobility Specialist Time Calculation (min) (ACUTE ONLY) 8 min   Pt supine in bed on RA upon arrival. Pt completes bed mobility, STS, and ambulates in hallway indep. Pt left in bed with needs in reach.   Terrilyn Saver  Mobility Specialist  06/19/23 10:02 AM

## 2023-06-19 NOTE — Discharge Summary (Signed)
Physician Discharge Summary   Patient: [name] MRN: [id number] DOB: 08-30-1971  Admit date:     06/17/2023  Discharge date: 06/19/2023  Discharge Physician: [name]   PCP: William Cory, MD   Recommendations at discharge:   Follow up with Primary Care in 1-2 weeks Repeat CBC, BMP in 1-2 weeks Follow up on Blood Pressure.  Pt was hypotensive and dehydrated on admission.  Valsartan was held.  BP improved with IV fluids.  BP's are normal at time of d/c.  Pt instructed to hold valsartan, monitor BP's 2-3 times daily at home, resume the medication if BP is > 140/90.    Discharge Diagnoses: Principal Problem:   Syncope and collapse Active Problems:   AKI (acute kidney injury) (HCC)   Hypotension   Essential hypertension   Bariatric surgery status   Acquired hypothyroidism   Obesity, Class III, BMI 40-49.9 (morbid obesity) (HCC)   OSA on CPAP   Anxiety and depression   Type 2 diabetes mellitus with peripheral neuropathy (HCC)   Chronic pain   Polypharmacy  Resolved Problems:   * No resolved hospital problems. [facility] Course: HPI on admission 06/17/23 by Dr. Para March: "[name] is a 52 y.o. male with medical history significant for  hypothyroidism, HTN, gout, DM morbid obesity on Mounjaro with history of bariatric surgery 2013, OSA on CPAP, anxiety and depression as well as chronic back pain and neuropathy, on multiple meds, hospitalized in January with aspiration pneumonia with hypoxia following left rotator cuff surgery who presents to the ED following a syncopal episode x 2.  Patient reports a several day history of dizziness and lightheadedness mostly when he bends forward or changes position from a sitting to standing up position but otherwise feels in his usual state of health.  Denies recent illness, no recent nausea, vomiting or diarrhea.  No cough fever chills, chest pain, shortness of breath, palpitations.  Patient has prior history of dizziness with  standing but has never been checked out. ED course and data review: BP 63/56 on arrival, fluid responsive to 107/74 with otherwise normal vitalsLabs most notable for creatinine of 3.41 up from a baseline of 1.13 about 3 months ago.  BUN 44 WBC 10,600 hemoglobin 16.8.  Troponin 9, TSH 8.43 with normal T4. Urinalysis pending. EKG, personally viewed and interpreted shows NSR at 86 with no concerning ST-T wave changes. Chest x-ray with no active cardiopulmonary disease Patient was treated with a 2 L NS bolus with good BP response.  Patient remained asymptomatic in the ED.  Hospitalist consulted for admission. "   06/19/23 --- pt doing well today.  Has been up ambulating and denies any dizziness or lightheadedness.  BP's stabilized and normal.  Medically stable and pt agreeable to discharge home today with close PCP follow up.  He is able and agreeable to check BP's at home given his BP med will be on hold to prevent recurrent hypotension and AKI.   Assessment and Plan: * Syncope and collapse Hypotension Polypharmacy Patient presents with recurrent dizziness with position change and 2 syncopal events today.  He had prodromal symptoms of feeling dizzy, lightheaded and vision darkening before episodes. No stigmata of infection or acute neurologic condition.   No vomiting or diarrhea Suspecting orthostatic hypotension / dehydration, possibly psychoactive meds contributing, cannot rule out possible dysautonomia related to diabetes. History of surgery several months ago with negative D-dimer - rules out VTE U/S carotids no significant stenosis. No events on telemetry monitoring  Echo with ED 50-55%, grade I diastolic dysfunction --Orthostatic vitals normal --Hold valsartan --Treated and improved with IV fluids  AKI (acute kidney injury) (HCC) Cr 3.41 on admission, baseline 1.13 Due to pre-renal azotemia from hypotension, high-dose Diovan 320 in combination with metformin, dehydration from working  out in the heat, excessive sweating.  No reported vomiting or diarrhea.  --Diovan and metformin were held --Treated with IV hydration  --Renal function significantly improved, near baseline (Cr 1.25 today) --Close PCP follow up and repeat labs in 1-2 weeks  Hypotension Hx of Essential hypertension BP was 63/56 on arrival, fluid responsive to 107/74.  Possibly orthostatic hypotension versus medication related or combination of both --Pt was continued on IV fluids & BP's improved. --Hold Diovan -- BP's normal / controlled with it held --Home BP monitoring --Close PCP follow up  Essential hypertension Holding Diovan due to hypotension on admission and AKI.  Renal function improved.  BP's are stable with Diovan on hold. --Hold Diovan at d/c --Home BP monitoring 2-3 times daily, pt to record readings and bring to PCP follow up --Pt advised resume Diovan if BP's above 140/90 or call PCP office for instructions  Polypharmacy Noted.  Monitor for signs of over-sedation and adjust meds accordingly.  Chronic pain Continue home pregabalin, duloxetine and cyclobenzaprine  Type 2 diabetes mellitus with peripheral neuropathy (HCC) Held metformin due to AKI - resume at d/c. Treated with sliding scale insulin   Anxiety and depression Continue home meds: Cymbalta, BuSpar, Seroquel, trazodone  OSA on CPAP CPAP nightly  Obesity, Class III, BMI 40-49.9 (morbid obesity) (HCC) S/p bariatric surgery 2013 Chronic Mounjaro use Complicates overall care and prognosis.  Recommend lifestyle modifications including physical activity and diet for weight loss and overall long-term health.   Acquired hypothyroidism TSH elevated but T4 within normal limits --Continue levothyroxine  Bariatric surgery status Noted. No acute issues.         Consultants: None Procedures performed: None  Disposition: Home Diet recommendation:  Cardiac and Carb modified diet DISCHARGE MEDICATION: Allergies as of  06/19/2023       Reactions   Morphine Itching        Medication List     STOP taking these medications    valsartan 320 MG tablet Commonly known as: DIOVAN       TAKE these medications    albuterol 108 (90 Base) MCG/ACT inhaler Commonly known as: VENTOLIN HFA Inhale 2 puffs into the lungs every 6 (six) hours as needed for wheezing or shortness of breath.   Allergy Relief 180 MG tablet Generic drug: fexofenadine TAKE 1 TABLET(180 MG) BY MOUTH DAILY   allopurinol 100 MG tablet Commonly known as: ZYLOPRIM Take 1 tablet (100 mg total) by mouth 2 (two) times daily.   busPIRone 5 MG tablet Commonly known as: BUSPAR TAKE 1 TO 2 TABLETS BY MOUTH THREE TIMES DAILY   cyclobenzaprine 10 MG tablet Commonly known as: FLEXERIL Take 1 tablet (10 mg total) by mouth at bedtime. TAKE 1 TABLET BY MOUTH AT BEDTIME   DULoxetine 60 MG capsule Commonly known as: CYMBALTA Take 1 capsule (60 mg total) by mouth daily.   fluticasone 50 MCG/ACT nasal spray Commonly known as: FLONASE Place 2 sprays into both nostrils daily.   levothyroxine 75 MCG tablet Commonly known as: SYNTHROID Take 1 tablet (75 mcg total) by mouth daily before breakfast. What changed: when to take this   Magnesium Oxide -Mg Supplement 500 MG Tabs Take 1 tablet by mouth once daily  metFORMIN 750 MG 24 hr tablet Commonly known as: GLUCOPHAGE-XR Take 1 tablet (750 mg total) by mouth daily with breakfast.   pregabalin 225 MG capsule Commonly known as: LYRICA Take 1 capsule (225 mg total) by mouth 2 (two) times daily.   QUEtiapine 25 MG tablet Commonly known as: SEROQUEL Take 1 tablet (25 mg total) by mouth at bedtime.   rosuvastatin 5 MG tablet Commonly known as: CRESTOR Take 1 tablet (5 mg total) by mouth daily.   sildenafil 100 MG tablet Commonly known as: VIAGRA Take 1 tablet (100 mg total) by mouth daily as needed for erectile dysfunction.   testosterone cypionate 200 MG/ML injection Commonly  known as: DEPOTESTOSTERONE CYPIONATE INJECT 1 ML INTO MUSCLE EVERY 14 DAYS   tirzepatide 10 MG/0.5ML Pen Commonly known as: MOUNJARO Inject 10 mg into the skin once a week.   traZODone 100 MG tablet Commonly known as: DESYREL TAKE 1 & 1/2 (ONE & ONE-HALF) TABLETS BY MOUTH AT BEDTIME        Discharge Exam: Filed Weights   06/17/23 1709 06/19/23 0310  Weight: 131.1 kg 130.7 kg   General exam: awake, alert, no acute distress, obese HEENT: atraumatic, clear conjunctiva, anicteric sclera, moist mucus membranes, hearing grossly normal  Respiratory system: CTAB, no wheezes, rales or rhonchi, normal respiratory effort. Cardiovascular system: normal S1/S2, RRR, no JVD, murmurs, rubs, gallops, no pedal edema.   Gastrointestinal system: soft, NT, ND, no HSM felt, +bowel sounds. Central nervous system: A&O x 4. no gross focal neurologic deficits, normal speech Extremities: moves all, no edema, normal tone Skin: dry, intact, normal temperature, normal color, No rashes, lesions or ulcers Psychiatry: normal mood, congruent affect, judgement and insight appear normal   Condition at discharge: stable  The results of significant diagnostics from this hospitalization (including imaging, microbiology, ancillary and laboratory) are listed below for reference.   Imaging Studies: ECHOCARDIOGRAM COMPLETE  Result Date: 06/18/2023    ECHOCARDIOGRAM REPORT   Patient Name:   BORIS ENGELMANN Date of Exam: 06/18/2023 Medical Rec #:  [id number]         Height:       69.0 in Accession #:    [id number]        Weight:       289.0 lb Date of Birth:  July 17, 1971          BSA:          2.416 m Patient Age:    52 years          BP:           119/79 mmHg Patient Gender: M                 HR:           58 bpm. Exam Location:  [facility] Procedure: 2D Echo, Cardiac Doppler and Color Doppler Indications:     Syncope  History:         Patient has no prior history of Echocardiogram examinations.                   Signs/Symptoms:Syncope; Risk Factors:Hypertension, Diabetes and                  Dyslipidemia.  Sonographer:     Mikki Harbor Referring Phys:  [id number] Andris Baumann Diagnosing Phys: Debbe Odea MD  Sonographer Comments: Patient is obese. IMPRESSIONS  1. Left ventricular ejection fraction, by estimation, is 50 to 55%. The left ventricle has low normal function.  The left ventricle has no regional wall motion abnormalities. There is mild left ventricular hypertrophy. Left ventricular diastolic parameters are consistent with Grade I diastolic dysfunction (impaired relaxation).  2. Right ventricular systolic function is normal. The right ventricular size is normal.  3. The mitral valve is normal in structure. No evidence of mitral valve regurgitation.  4. The aortic valve is tricuspid. Aortic valve regurgitation is not visualized.  5. Aortic dilatation noted. 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 There is mild dilatation of the ascending aorta, measuring 41 mm. FINDINGS  Left Ventricle: Left ventricular ejection fraction, by estimation, is 50 to 55%. The left ventricle has low normal function. The left ventricle has no regional wall motion abnormalities. The left ventricular internal cavity size was normal in size. There is mild left ventricular hypertrophy. Left ventricular diastolic parameters are consistent with Grade I diastolic dysfunction (impaired relaxation). Right Ventricle: The right ventricular size is normal. No increase in right ventricular wall thickness. Right ventricular systolic function is normal. Left Atrium: Left atrial size was normal in size. Right Atrium: Right atrial size was normal in size. Pericardium: There is no evidence of pericardial effusion. Mitral Valve: The mitral valve is normal in structure. No evidence of mitral valve regurgitation. MV peak gradient, 3.0 mmHg. The mean mitral valve gradient is 1.0 mmHg. Tricuspid  Valve: The tricuspid valve is normal in structure. Tricuspid valve regurgitation is mild. Aortic Valve: The aortic valve is tricuspid. Aortic valve regurgitation is not visualized. Aortic valve mean gradient measures 4.0 mmHg. Aortic valve peak gradient measures 7.6 mmHg. Aortic valve area, by VTI measures 2.97 cm. Pulmonic Valve: The pulmonic valve was normal in structure. Pulmonic valve regurgitation is trivial. Aorta: Aortic dilatation noted. 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 There is mild dilatation of the ascending aorta, measuring 41 mm. Venous: The inferior vena cava was not well visualized. IAS/Shunts: No atrial level shunt detected by color flow Doppler.  LEFT VENTRICLE PLAX 2D LVIDd:         5.10 cm   Diastology LVIDs:         3.60 cm   LV e' medial:    7.18 cm/s LV PW:         1.20 cm   LV E/e' medial:  7.5 LV IVS:        1.20 cm   LV e' lateral:   11.00 cm/s LVOT diam:     2.00 cm   LV E/e' lateral: 4.9 LV SV:         80 LV SV Index:   33 LVOT Area:     3.14 cm  RIGHT VENTRICLE RV Basal diam:  3.80 cm RV Mid diam:    4.30 cm RV S prime:     14.00 cm/s TAPSE (M-mode): 2.5 cm LEFT ATRIUM             Index        RIGHT ATRIUM           Index LA diam:        3.90 cm 1.61 cm/m   RA Area:     20.20 cm LA Vol (A2C):   64.9 ml 26.86 ml/m  RA Volume:   61.00 ml  25.25 ml/m LA Vol (A4C):   38.7 ml 16.02 ml/m LA Biplane Vol: 52.0 ml 21.52 ml/m  AORTIC VALVE  PULMONIC VALVE AV Area (Vmax):    2.66 cm     PV Vmax:       0.94 m/s AV Area (Vmean):   2.58 cm     PV Peak grad:  3.5 mmHg AV Area (VTI):     2.97 cm AV Vmax:           138.00 cm/s AV Vmean:          88.000 cm/s AV VTI:            0.271 m AV Peak Grad:      7.6 mmHg AV Mean Grad:      4.0 mmHg LVOT Vmax:         117.00 cm/s LVOT Vmean:        72.300 cm/s LVOT VTI:          0.256 m LVOT/AV VTI ratio: 0.94  AORTA Ao Root diam: 4.40 cm Ao Asc diam:  4.10 cm MITRAL VALVE MV  Area (PHT): 2.62 cm    SHUNTS MV Area VTI:   3.57 cm    Systemic VTI:  0.26 m MV Peak grad:  3.0 mmHg    Systemic Diam: 2.00 cm MV Mean grad:  1.0 mmHg MV Vmax:       0.87 m/s MV Vmean:      36.3 cm/s MV Decel Time: 289 msec MV E velocity: 53.50 cm/s MV A velocity: 68.70 cm/s MV E/A ratio:  0.78 Debbe Odea MD Electronically signed by Debbe Odea MD Signature Date/Time: 06/18/2023/1:01:34 PM    Final    US Carotid Bilateral  Result Date: 06/17/2023 CLINICAL DATA:  Syncope, collapse, blurred vision, hyperlipidemia, hypertension, diabetes EXAM: BILATERAL CAROTID DUPLEX ULTRASOUND TECHNIQUE: Wallace Cullens scale imaging, color Doppler and duplex ultrasound were performed of bilateral carotid and vertebral arteries in the neck. COMPARISON:  None Available. FINDINGS: Criteria: Quantification of carotid stenosis is based on velocity parameters that correlate the residual internal carotid diameter with NASCET-based stenosis levels, using the diameter of the distal internal carotid lumen as the denominator for stenosis measurement. The following velocity measurements were obtained: RIGHT ICA: 68/21 cm/sec CCA: 80/22 cm/sec SYSTOLIC ICA/CCA RATIO:  0.9 ECA: 49 cm/sec LEFT ICA: 79/15 cm/sec CCA: 94/17 cm/sec SYSTOLIC ICA/CCA RATIO:  0.8 ECA: 73 cm/sec RIGHT CAROTID ARTERY: No significant atheromatous plaque. Normal Doppler waveforms. RIGHT VERTEBRAL ARTERY:  Antegrade LEFT CAROTID ARTERY: No significant atheromatous plaque. Normal Doppler waveforms. LEFT VERTEBRAL ARTERY:  Antegrade. IMPRESSION: 1. Unremarkable exam. Estimated 0-49% stenosis within the bilateral internal carotid arteries. Electronically Signed   By: Sharlet Salina M.D.   On: 06/17/2023 23:41   DG Knee 3 Views Left  Result Date: 06/17/2023 CLINICAL DATA:  Status post fall. EXAM: LEFT KNEE - 3 VIEW COMPARISON:  None Available. FINDINGS: No evidence of fracture, dislocation, or joint effusion. Medial and lateral marginal osteophyte formation is seen.  There is marked severity patellofemoral and medial tibiofemoral compartment space narrowing. Soft tissues are unremarkable. IMPRESSION: Marked severity degenerative changes without an acute osseous abnormality. Electronically Signed   By: Aram Candela M.D.   On: 06/17/2023 22:17   DG Chest 2 View  Result Date: 06/17/2023 CLINICAL DATA:  Weakness. EXAM: CHEST - 2 VIEW COMPARISON:  January 04, 2023. FINDINGS: Stable cardiomediastinal silhouette. Both lungs are clear. The visualized skeletal structures are unremarkable. IMPRESSION: No active cardiopulmonary disease. Electronically Signed   By: Lupita Raider M.D.   On: 06/17/2023 18:54    Microbiology: Results for orders placed or performed during the  hospital encounter of 01/04/23  Resp panel by RT-PCR (RSV, Flu A&B, Covid) Anterior Nasal Swab     Status: None   Collection Time: 01/04/23  6:50 PM   Specimen: Anterior Nasal Swab  Result Value Ref Range Status   SARS Coronavirus 2 by RT PCR NEGATIVE NEGATIVE Final    Comment: (NOTE) SARS-CoV-2 target nucleic acids are NOT DETECTED.  The SARS-CoV-2 RNA is generally detectable in upper respiratory specimens during the acute phase of infection. The lowest concentration of SARS-CoV-2 viral copies this assay can detect is 138 copies/mL. A negative result does not preclude SARS-Cov-2 infection and should not be used as the sole basis for treatment or other patient management decisions. A negative result may occur with  improper specimen collection/handling, submission of specimen other than nasopharyngeal swab, presence of viral mutation(s) within the areas targeted by this assay, and inadequate number of viral copies(<138 copies/mL). A negative result must be combined with clinical observations, patient history, and epidemiological information. The expected result is Negative.  Fact Sheet for Patients:  BloggerCourse.com  Fact Sheet for Healthcare Providers:   SeriousBroker.it  This test is no t yet approved or cleared by the [place] FDA and  has been authorized for detection and/or diagnosis of SARS-CoV-2 by FDA under an Emergency Use Authorization (EUA). This EUA will remain  in effect (meaning this test can be used) for the duration of the COVID-19 declaration under Section 564(b)(1) of the Act, 21 U.S.C.section 360bbb-3(b)(1), unless the authorization is terminated  or revoked sooner.       Influenza A by PCR NEGATIVE NEGATIVE Final   Influenza B by PCR NEGATIVE NEGATIVE Final    Comment: (NOTE) The Xpert Xpress SARS-CoV-2/FLU/RSV plus assay is intended as an aid in the diagnosis of influenza from Nasopharyngeal swab specimens and should not be used as a sole basis for treatment. Nasal washings and aspirates are unacceptable for Xpert Xpress SARS-CoV-2/FLU/RSV testing.  Fact Sheet for Patients: BloggerCourse.com  Fact Sheet for Healthcare Providers: SeriousBroker.it  This test is not yet approved or cleared by the [place] FDA and has been authorized for detection and/or diagnosis of SARS-CoV-2 by FDA under an Emergency Use Authorization (EUA). This EUA will remain in effect (meaning this test can be used) for the duration of the COVID-19 declaration under Section 564(b)(1) of the Act, 21 U.S.C. section 360bbb-3(b)(1), unless the authorization is terminated or revoked.     Resp Syncytial Virus by PCR NEGATIVE NEGATIVE Final    Comment: (NOTE) Fact Sheet for Patients: BloggerCourse.com  Fact Sheet for Healthcare Providers: SeriousBroker.it  This test is not yet approved or cleared by the [place] FDA and has been authorized for detection and/or diagnosis of SARS-CoV-2 by FDA under an Emergency Use Authorization (EUA). This EUA will remain in effect (meaning this test can be used) for  the duration of the COVID-19 declaration under Section 564(b)(1) of the Act, 21 U.S.C. section 360bbb-3(b)(1), unless the authorization is terminated or revoked.  Performed at [facility], [address] [place], [place] [address]     Labs: CBC: Recent Labs  Lab 06/17/23 1718 06/18/23 0544 06/19/23 0326  WBC 10.6* 7.8 5.7  HGB 16.8 13.8 13.6  HCT 52.6* 42.7 42.9  MCV 90.1 90.9 91.3  PLT 196 134* 133*   Basic Metabolic Panel: Recent Labs  Lab 06/17/23 1718 06/18/23 0544 06/19/23 0326  NA 134* 139 139  K 4.7 4.0 4.7  CL 98 108 110  CO2 25 25 26  GLUCOSE 129* 100* 112*  BUN 44* 40* 25*  CREATININE 3.41* 2.01* 1.25*  CALCIUM 8.5* 7.4* 7.8*  MG 2.4  --  2.2   Liver Function Tests: Recent Labs  Lab 06/17/23 1718 06/19/23 0326  AST 23 15  ALT 15 12  ALKPHOS 101 84  BILITOT 1.3* 0.9  PROT 6.4* 4.9*  ALBUMIN 3.8 2.8*   CBG: Recent Labs  Lab 06/18/23 1112 06/18/23 1309 06/18/23 1620 06/18/23 2047 06/19/23 0853  GLUCAP 142* 107* 211* 106* 114*    Discharge time spent: less than 30 minutes.  Signed: Pennie Banter, DO [facility] 06/19/2023

## 2023-06-23 ENCOUNTER — Telehealth: Payer: Self-pay

## 2023-06-23 NOTE — Transitions of Care (Post Inpatient/ED Visit) (Unsigned)
   06/23/2023  Name: [name] MRN: [id number] DOB: December 05, 1971  Today's TOC FU Call Status: Today's TOC FU Call Status:: Unsuccessul Call (1st Attempt) Unsuccessful Call (1st Attempt) Date: 06/23/23  Attempted to reach the patient regarding the most recent Inpatient/ED visit.  Follow Up Plan: Additional outreach attempts will be made to reach the patient to complete the Transitions of Care (Post Inpatient/ED visit) call.   Signature Karena Addison, LPN [facility] Nurse Health Advisor Direct Dial [phone]

## 2023-06-24 NOTE — Transitions of Care (Post Inpatient/ED Visit) (Signed)
06/24/2023  Name: [name] MRN: [id number] DOB: [birth year]  Today's TOC FU Call Status: Today's TOC FU Call Status:: Successful TOC FU Call Competed Unsuccessful Call (1st Attempt) Date: 06/23/23 [facility] FU Call Complete Date: 06/24/23  Transition Care Management Follow-up Telephone Call Date of Discharge: 06/19/23 Discharge Facility: [facility]) Type of Discharge: Inpatient Admission Primary Inpatient Discharge Diagnosis:: syncope How have you been since you were released from the hospital?: Better Any questions or concerns?: No  Items Reviewed: Did you receive and understand the discharge instructions provided?: Yes Medications obtained,verified, and reconciled?: Yes (Medications Reviewed) Any new allergies since your discharge?: No Dietary orders reviewed?: Yes Do you have support at home?: Yes People in Home: significant other, spouse  Medications Reviewed Today: Medications Reviewed Today     Reviewed by Karena Addison, LPN (Licensed Practical Nurse) on 06/24/23 at 1554  Med List Status: <None>   Medication Order Taking? Sig Documenting Provider Last Dose Status Informant  albuterol (VENTOLIN HFA) 108 (90 Base) MCG/ACT inhaler [id number] No Inhale 2 puffs into the lungs every 6 (six) hours as needed for wheezing or shortness of breath. Enedina Finner, MD prn unk Active Spouse/Significant Other  ALLERGY RELIEF 180 MG tablet [id number] No TAKE 1 TABLET(180 MG) BY MOUTH DAILY Carlynn Purl, Danna Hefty, MD prn unk Active Spouse/Significant Other  allopurinol (ZYLOPRIM) 100 MG tablet [id number] No Take 1 tablet (100 mg total) by mouth 2 (two) times daily. Alba Cory, MD 06/17/2023 Active Spouse/Significant Other  busPIRone (BUSPAR) 5 MG tablet [id number] No TAKE 1 TO 2 TABLETS BY MOUTH THREE TIMES DAILY Alba Cory, MD 06/17/2023 Active Spouse/Significant Other  cyclobenzaprine (FLEXERIL) 10 MG tablet [id number] No Take 1 tablet (10 mg total) by mouth at  bedtime. TAKE 1 TABLET BY MOUTH AT BEDTIME Alba Cory, MD 06/16/2023 Active Spouse/Significant Other  DULoxetine (CYMBALTA) 60 MG capsule [id number] No Take 1 capsule (60 mg total) by mouth daily. Alba Cory, MD 06/17/2023 Active Spouse/Significant Other  fluticasone (FLONASE) 50 MCG/ACT nasal spray [id number] No Place 2 sprays into both nostrils daily. Alba Cory, MD 06/17/2023 Active Spouse/Significant Other  levothyroxine (SYNTHROID) 75 MCG tablet [id number] No Take 1 tablet (75 mcg total) by mouth daily before breakfast.  Patient taking differently: Take 75 mcg by mouth at bedtime.   Alba Cory, MD 06/16/2023 Active Spouse/Significant Other  Magnesium Oxide 500 MG TABS [id number] No Take 1 tablet by mouth once daily Alba Cory, MD 06/16/2023 Active Spouse/Significant Other  metFORMIN (GLUCOPHAGE-XR) 750 MG 24 hr tablet [id number] No Take 1 tablet (750 mg total) by mouth daily with breakfast. Alba Cory, MD 06/17/2023 Active Spouse/Significant Other  pregabalin (LYRICA) 225 MG capsule [id number] No Take 1 capsule (225 mg total) by mouth 2 (two) times daily. Alba Cory, MD 06/17/2023 Active Spouse/Significant Other  QUEtiapine (SEROQUEL) 25 MG tablet [id number] No Take 1 tablet (25 mg total) by mouth at bedtime. Alba Cory, MD 06/16/2023 Active Spouse/Significant Other  rosuvastatin (CRESTOR) 5 MG tablet [id number] No Take 1 tablet (5 mg total) by mouth daily. Alba Cory, MD 06/17/2023 Active Spouse/Significant Other  sildenafil (VIAGRA) 100 MG tablet [id number] No Take 1 tablet (100 mg total) by mouth daily as needed for erectile dysfunction. Michiel Cowboy A, PA-C prn unk Active Spouse/Significant Other  testosterone cypionate (DEPOTESTOSTERONE CYPIONATE) 200 MG/ML injection [id number] No INJECT 1 ML INTO MUSCLE EVERY 14 DAYS McGowan, Wimer A, PA-C 06/15/2023 Active Spouse/Significant Other  tirzepatide (MOUNJARO) 10 MG/0.5ML Pen [id number] No Inject 10 mg into the skin  once a week.  Alba Cory, MD 06/15/2023 Active Spouse/Significant Other  traZODone (DESYREL) 100 MG tablet [id number] No TAKE 1 & 1/2 (ONE & ONE-HALF) TABLETS BY MOUTH AT BEDTIME Alba Cory, MD 06/16/2023 Active Spouse/Significant Other            Home Care and Equipment/Supplies: Were Home Health Services Ordered?: NA Any new equipment or medical supplies ordered?: NA  Functional Questionnaire: Do you need assistance with bathing/showering or dressing?: No Do you need assistance with meal preparation?: No Do you need assistance with eating?: No Do you have difficulty maintaining continence: No Do you need assistance with getting out of bed/getting out of a chair/moving?: No Do you have difficulty managing or taking your medications?: No  Follow up appointments reviewed: PCP Follow-up appointment confirmed?: Yes Date of PCP follow-up appointment?: 07/02/23 Follow-up Provider: [facility] Follow-up appointment confirmed?: NA Do you need transportation to your follow-up appointment?: No Do you understand care options if your condition(s) worsen?: Yes-patient verbalized understanding    SIGNATURE Karena Addison, LPN [facility] Nurse Health Advisor Direct Dial [phone]

## 2023-06-27 ENCOUNTER — Ambulatory Visit: Payer: Medicare Other | Admitting: Family Medicine

## 2023-06-27 NOTE — Progress Notes (Deleted)
    SUBJECTIVE:   CHIEF COMPLAINT / HPI:   ***med concerns   OBJECTIVE:   There were no vitals taken for this visit.  ***  ASSESSMENT/PLAN:   No problem-specific Assessment & Plan notes found for this encounter.     Caro Laroche, DO

## 2023-07-02 ENCOUNTER — Other Ambulatory Visit: Payer: Self-pay | Admitting: Family Medicine

## 2023-07-02 ENCOUNTER — Other Ambulatory Visit: Payer: Self-pay | Admitting: Urology

## 2023-07-02 DIAGNOSIS — F341 Dysthymic disorder: Secondary | ICD-10-CM

## 2023-07-02 DIAGNOSIS — E785 Hyperlipidemia, unspecified: Secondary | ICD-10-CM

## 2023-07-02 DIAGNOSIS — E669 Obesity, unspecified: Secondary | ICD-10-CM

## 2023-07-02 DIAGNOSIS — N529 Male erectile dysfunction, unspecified: Secondary | ICD-10-CM

## 2023-07-02 DIAGNOSIS — R051 Acute cough: Secondary | ICD-10-CM

## 2023-07-02 DIAGNOSIS — F419 Anxiety disorder, unspecified: Secondary | ICD-10-CM

## 2023-07-03 ENCOUNTER — Other Ambulatory Visit: Payer: Self-pay | Admitting: Family Medicine

## 2023-07-03 DIAGNOSIS — G4709 Other insomnia: Secondary | ICD-10-CM

## 2023-07-24 ENCOUNTER — Ambulatory Visit (INDEPENDENT_AMBULATORY_CARE_PROVIDER_SITE_OTHER): Payer: Medicare Other

## 2023-07-24 VITALS — Ht 72.0 in | Wt 288.0 lb

## 2023-07-24 DIAGNOSIS — Z0000 Encounter for general adult medical examination without abnormal findings: Secondary | ICD-10-CM

## 2023-07-24 NOTE — Patient Instructions (Signed)
[name] , Thank you for taking time to come for your Medicare Wellness Visit. I appreciate your ongoing commitment to your health goals. Please review the following plan we discussed and let me know if I can assist you in the future.   Referrals/Orders/Follow-Ups/Clinician Recommendations: none  This is a list of the screening recommended for you and due dates:  Health Maintenance  Topic Date Due   Zoster (Shingles) Vaccine (1 of 2) Never done   COVID-19 Vaccine (3 - Pfizer risk series) 05/08/2020   Complete foot exam   06/12/2023   Flu Shot  07/24/2023   Hemoglobin A1C  09/12/2023   Yearly kidney health urinalysis for diabetes  09/13/2023   Eye exam for diabetics  10/09/2023   Yearly kidney function blood test for diabetes  06/18/2024   Medicare Annual Wellness Visit  07/23/2024   Cologuard (Stool DNA test)  07/02/2025   DTaP/Tdap/Td vaccine (4 - Td or Tdap) 03/06/2033   Hepatitis C Screening  Completed   HIV Screening  Completed   HPV Vaccine  Aged Out    Advanced directives: (Declined) Advance directive discussed with you today. Even though you declined this today, please call our office should you change your mind, and we can give you the proper paperwork for you to fill out.  Next Medicare Annual Wellness Visit scheduled for next year: Yes 07/29/24 @ 1:30pm telephone  Preventive Care 40-64 Years, Male Preventive care refers to lifestyle choices and visits with your health care provider that can promote health and wellness. What does preventive care include? A yearly physical exam. This is also called an annual well check. Dental exams once or twice a year. Routine eye exams. Ask your health care provider how often you should have your eyes checked. Personal lifestyle choices, including: Daily care of your teeth and gums. Regular physical activity. Eating a healthy diet. Avoiding tobacco and drug use. Limiting alcohol use. Practicing safe sex. Taking low-dose aspirin  every day starting at age 21. What happens during an annual well check? The services and screenings done by your health care provider during your annual well check will depend on your age, overall health, lifestyle risk factors, and family history of disease. Counseling  Your health care provider may ask you questions about your: Alcohol use. Tobacco use. Drug use. Emotional well-being. Home and relationship well-being. Sexual activity. Eating habits. Work and work Astronomer. Screening  You may have the following tests or measurements: Height, weight, and BMI. Blood pressure. Lipid and cholesterol levels. These may be checked every 5 years, or more frequently if you are over 28 years old. Skin check. Lung cancer screening. You may have this screening every year starting at age 57 if you have a 30-pack-year history of smoking and currently smoke or have quit within the past 15 years. Fecal occult blood test (FOBT) of the stool. You may have this test every year starting at age 11. Flexible sigmoidoscopy or colonoscopy. You may have a sigmoidoscopy every 5 years or a colonoscopy every 10 years starting at age 34. Prostate cancer screening. Recommendations will vary depending on your family history and other risks. Hepatitis C blood test. Hepatitis B blood test. Sexually transmitted disease (STD) testing. Diabetes screening. This is done by checking your blood sugar (glucose) after you have not eaten for a while (fasting). You may have this done every 1-3 years. Discuss your test results, treatment options, and if necessary, the need for more tests with your health care provider. Vaccines  Your health care provider may recommend certain vaccines, such as: Influenza vaccine. This is recommended every year. Tetanus, diphtheria, and acellular pertussis (Tdap, Td) vaccine. You may need a Td booster every 10 years. Zoster vaccine. You may need this after age 32. Pneumococcal 13-valent  conjugate (PCV13) vaccine. You may need this if you have certain conditions and have not been vaccinated. Pneumococcal polysaccharide (PPSV23) vaccine. You may need one or two doses if you smoke cigarettes or if you have certain conditions. Talk to your health care provider about which screenings and vaccines you need and how often you need them. This information is not intended to replace advice given to you by your health care provider. Make sure you discuss any questions you have with your health care provider. Document Released: 01/05/2016 Document Revised: 08/28/2016 Document Reviewed: 10/10/2015 Elsevier Interactive Patient Education  2017 [employer].  Fall Prevention in the Home Falls can cause injuries. They can happen to people of all ages. There are many things you can do to make your home safe and to help prevent falls. What can I do on the outside of my home? Regularly fix the edges of walkways and driveways and fix any cracks. Remove anything that might make you trip as you walk through a door, such as a raised step or threshold. Trim any bushes or trees on the path to your home. Use bright outdoor lighting. Clear any walking paths of anything that might make someone trip, such as rocks or tools. Regularly check to see if handrails are loose or broken. Make sure that both sides of any steps have handrails. Any raised decks and porches should have guardrails on the edges. Have any leaves, snow, or ice cleared regularly. Use sand or salt on walking paths during winter. Clean up any spills in your garage right away. This includes oil or grease spills. What can I do in the bathroom? Use night lights. Install grab bars by the toilet and in the tub and shower. Do not use towel bars as grab bars. Use non-skid mats or decals in the tub or shower. If you need to sit down in the shower, use a plastic, non-slip stool. Keep the floor dry. Clean up any water that spills on the floor as  soon as it happens. Remove soap buildup in the tub or shower regularly. Attach bath mats securely with double-sided non-slip rug tape. Do not have throw rugs and other things on the floor that can make you trip. What can I do in the bedroom? Use night lights. Make sure that you have a light by your bed that is easy to reach. Do not use any sheets or blankets that are too big for your bed. They should not hang down onto the floor. Have a firm chair that has side arms. You can use this for support while you get dressed. Do not have throw rugs and other things on the floor that can make you trip. What can I do in the kitchen? Clean up any spills right away. Avoid walking on wet floors. Keep items that you use a lot in easy-to-reach places. If you need to reach something above you, use a strong step stool that has a grab bar. Keep electrical cords out of the way. Do not use floor polish or wax that makes floors slippery. If you must use wax, use non-skid floor wax. Do not have throw rugs and other things on the floor that can make you trip. What can I  do with my stairs? Do not leave any items on the stairs. Make sure that there are handrails on both sides of the stairs and use them. Fix handrails that are broken or loose. Make sure that handrails are as long as the stairways. Check any carpeting to make sure that it is firmly attached to the stairs. Fix any carpet that is loose or worn. Avoid having throw rugs at the top or bottom of the stairs. If you do have throw rugs, attach them to the floor with carpet tape. Make sure that you have a light switch at the top of the stairs and the bottom of the stairs. If you do not have them, ask someone to add them for you. What else can I do to help prevent falls? Wear shoes that: Do not have high heels. Have rubber bottoms. Are comfortable and fit you well. Are closed at the toe. Do not wear sandals. If you use a stepladder: Make sure that it is  fully opened. Do not climb a closed stepladder. Make sure that both sides of the stepladder are locked into place. Ask someone to hold it for you, if possible. Clearly mark and make sure that you can see: Any grab bars or handrails. First and last steps. Where the edge of each step is. Use tools that help you move around (mobility aids) if they are needed. These include: Canes. Walkers. Scooters. Crutches. Turn on the lights when you go into a dark area. Replace any light bulbs as soon as they burn out. Set up your furniture so you have a clear path. Avoid moving your furniture around. If any of your floors are uneven, fix them. If there are any pets around you, be aware of where they are. Review your medicines with your doctor. Some medicines can make you feel dizzy. This can increase your chance of falling. Ask your doctor what other things that you can do to help prevent falls. This information is not intended to replace advice given to you by your health care provider. Make sure you discuss any questions you have with your health care provider. Document Released: 10/05/2009 Document Revised: 05/16/2016 Document Reviewed: 01/13/2015 Elsevier Interactive Patient Education  2017 [employer].

## 2023-07-24 NOTE — Progress Notes (Signed)
Subjective:   [name] is a 52 y.o. male who presents for Medicare Annual/Subsequent preventive examination.  Visit Complete: Virtual  I connected with  [name] and his wife Efraim Kaufmann (on speaker phone) on 07/24/23 by a audio enabled telemedicine application and verified that I am speaking with the correct person using two identifiers.  Patient Location: Home  Provider Location: Office/Clinic  I discussed the limitations of evaluation and management by telemedicine. The patient expressed understanding and agreed to proceed.  Vital Signs: Unable to obtain new vitals due to this being a telehealth visit.  Patient Medicare AWV questionnaire was completed by the patient on (Not done); I have confirmed that all information answered by patient is correct and no changes since this date.  Review of Systems    Cardiac Risk Factors include: advanced age (>49men, >63 women);dyslipidemia;hypertension;male gender;sedentary lifestyle;obesity (BMI >30kg/m2);smoking/ tobacco exposure    Objective:    Today's Vitals   07/24/23 1428  Weight: 288 lb (130.6 kg)  Height: 6' (1.829 m)  PainSc: 8    Body mass index is 39.06 kg/m.     07/24/2023    2:49 PM 06/18/2023    6:41 PM 06/17/2023    5:11 PM 01/04/2023   11:00 PM 01/04/2023    5:12 PM 11/21/2017   10:57 AM 08/01/2017   10:12 AM  Advanced Directives  Does Patient Have a Medical Advance Directive? No No No No Yes No No  Would patient like information on creating a medical advance directive?  No - Patient declined No - Patient declined No - Patient declined  No - Patient declined     Current Medications (verified) Outpatient Encounter Medications as of 07/24/2023  Medication Sig   albuterol (VENTOLIN HFA) 108 (90 Base) MCG/ACT inhaler Inhale 2 puffs into the lungs every 6 (six) hours as needed for wheezing or shortness of breath.   ALLERGY RELIEF 180 MG tablet TAKE 1 TABLET(180 MG) BY MOUTH DAILY   allopurinol (ZYLOPRIM) 100  MG tablet Take 1 tablet (100 mg total) by mouth 2 (two) times daily.   busPIRone (BUSPAR) 5 MG tablet TAKE 1 TO 2 TABLETS BY MOUTH THREE TIMES DAILY   cyclobenzaprine (FLEXERIL) 10 MG tablet Take 1 tablet (10 mg total) by mouth at bedtime. TAKE 1 TABLET BY MOUTH AT BEDTIME   DULoxetine (CYMBALTA) 60 MG capsule Take 1 capsule (60 mg total) by mouth daily.   fluticasone (FLONASE) 50 MCG/ACT nasal spray Place 2 sprays into both nostrils daily.   levothyroxine (SYNTHROID) 75 MCG tablet Take 1 tablet (75 mcg total) by mouth daily before breakfast. (Patient taking differently: Take 75 mcg by mouth at bedtime.)   Magnesium Oxide 500 MG TABS Take 1 tablet by mouth once daily   metFORMIN (GLUCOPHAGE-XR) 750 MG 24 hr tablet Take 1 tablet (750 mg total) by mouth daily with breakfast.   pregabalin (LYRICA) 225 MG capsule Take 1 capsule (225 mg total) by mouth 2 (two) times daily.   QUEtiapine (SEROQUEL) 25 MG tablet Take 1 tablet (25 mg total) by mouth at bedtime.   rosuvastatin (CRESTOR) 5 MG tablet Take 1 tablet (5 mg total) by mouth daily.   sildenafil (VIAGRA) 100 MG tablet TAKE 1 TABLET(100 MG) BY MOUTH DAILY AS NEEDED FOR ERECTILE DYSFUNCTION   testosterone cypionate (DEPOTESTOSTERONE CYPIONATE) 200 MG/ML injection ADMINISTER 1 ML IN THE MUSCLE EVERY 14 DAYS   tirzepatide (MOUNJARO) 10 MG/0.5ML Pen Inject 10 mg into the skin once a week.   traZODone (DESYREL)  100 MG tablet TAKE 1 & 1/2 (ONE & ONE-HALF) TABLETS BY MOUTH AT BEDTIME   No facility-administered encounter medications on file as of 07/24/2023.    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   SHOULDER ARTHROSCOPY W/ ROTATOR CUFF REPAIR Left 01/02/2023   URETEROTOMY Left 12/23/1998   Family History  Problem Relation Age of Onset   Coronary artery disease Mother    Cancer Father        Melanoma and Testicular   Diabetes Father    Social History   Socioeconomic History   Marital status: Married    Spouse name: Not on file   Number of children: 2   Years of education: Not on file   Highest education level: Some college, no degree  Occupational History   Occupation: grave Furniture conservator/restorer     Comment: [employer]   Tobacco Use   Smoking status: Former    Current packs/day: 0.00    Average packs/day: 3.0 packs/day for 8.0 years (24.0 ttl pk-yrs)    Types: Cigarettes, Cigars, Pipe    Start date: 06/28/1984    Quit date: 12/24/1991    Years since quitting: 31.6   Smokeless tobacco: Former    Types: Chew    Quit date: 12/24/1991  Substance and Sexual Activity   Alcohol use: No    Alcohol/week: 0.0 standard drinks of alcohol   Drug use: No   Sexual activity: Yes    Partners: Female  Other Topics Concern   Not on file  Social History Narrative   Married, lives with wife, both children and grandchildren   Social Determinants of Health   Financial Resource Strain: Low Risk  (07/24/2023)   Overall Financial Resource Strain (CARDIA)    Difficulty of Paying Living Expenses: Not very hard  Food Insecurity: No Food Insecurity (07/24/2023)   Hunger Vital Sign    Worried About Running Out of Food in the Last Year: Never true    Ran Out of Food in the Last Year: Never true  Transportation Needs: No Transportation Needs (07/24/2023)   PRAPARE - Administrator, Civil Service (Medical): No    Lack of Transportation (Non-Medical): No  Physical Activity: Inactive (07/24/2023)   Exercise Vital Sign     Days of Exercise per Week: 0 days    Minutes of Exercise per Session: 0 min  Stress: Stress Concern Present (07/24/2023)   [employer] of Occupational Health - Occupational Stress Questionnaire    Feeling of Stress : Very much  Social Connections: Moderately Isolated (07/24/2023)   Social Connection and Isolation Panel [NHANES]    Frequency of Communication with Friends and Family: Twice a week    Frequency of Social Gatherings with Friends and Family: Once a week  Attends Religious Services: Never    Active Member of Clubs or Organizations: No    Attends Banker Meetings: Never    Marital Status: Married    Tobacco Counseling Counseling given: Not Answered   Clinical Intake:  Pre-visit preparation completed: Yes  Pain : 0-10 Pain Score: 8  Pain Type: Chronic pain Pain Location: Back (lft shoulder and both legs) Pain Orientation: Lower Pain Descriptors / Indicators: Aching Pain Onset: More than a month ago Pain Frequency: Constant Pain Relieving Factors: medication Effect of Pain on Daily Activities: difficulty with adl's  Pain Relieving Factors: medication  BMI - recorded: 39.06 Nutritional Status: BMI > 30  Obese Nutritional Risks: None Diabetes: Yes CBG done?: No Did pt. bring in CBG monitor from home?: No  How often do you need to have someone help you when you read instructions, pamphlets, or other written materials from your doctor or pharmacy?: 1 - Never  Interpreter Needed?: No  Comments: lives with wife, mother, two daughters, two aunts:13 ppl in house Information entered by :: B.,LPN   Activities of Daily Living    07/24/2023    2:49 PM 06/18/2023    6:44 PM  In your present state of health, do you have any difficulty performing the following activities:  Hearing? 0   Vision? 0   Difficulty concentrating or making decisions? 0   Walking or climbing stairs? 1   Dressing or bathing? 1   Doing errands, shopping? 1 0   Preparing Food and eating ? N   Using the Toilet? N   In the past six months, have you accidently leaked urine? N   Do you have problems with loss of bowel control? N   Managing your Medications? N   Managing your Finances? N   Housekeeping or managing your Housekeeping? N     Patient Care Team: Alba Cory, MD as PCP - General (Family Medicine)  Indicate any recent Medical Services you may have received from other than [facility] providers in the past year (date may be approximate).     Assessment:   This is a routine wellness examination for Chrissie Noa.  Hearing/Vision screen Hearing Screening - Comments:: Adequate hearing Vision Screening - Comments:: Adequate vision w/glasses [facility]  Dietary issues and exercise activities discussed:     Goals Addressed             This Visit's Progress    Manage My Emotions   On track    Timeframe:  Long-Range Goal Priority:  Medium Start Date: 07/24/21                            Expected End Date:     09/07/21               Follow Up Date 09/07/21   - begin personal counseling when ready - talk about feelings with a friend, family or spiritual advisor - practice positive thinking and self-talk    Why is this important?   When you are stressed, down or upset, your body reacts too.  For example, your blood pressure may get higher; you may have a headache or stomachache.  When your emotions get the best of you, your body's ability to fight off cold and flu gets weak.  These steps will help you manage your emotions.     Notes:        Depression Screen    07/24/2023  2:45 PM 03/12/2023    8:35 AM 01/09/2023    9:36 AM 12/12/2022    7:49 AM 11/12/2022    2:48 PM 09/12/2022    8:22 AM 06/11/2022    2:51 PM  PHQ 2/9 Scores  PHQ - 2 Score 0 0 0 0 0 0 0  PHQ- 9 Score  0 3 0 0 0 3    Fall Risk    07/24/2023    2:39 PM 03/12/2023    8:35 AM 01/09/2023    9:42 AM 12/12/2022    7:49 AM 11/12/2022    2:48 PM  Fall Risk    Falls in the past year? 1 1 1 1 1   Number falls in past yr: 1 1 0 0 0  Injury with Fall? 1 1 1 1 1   Comment head fall but refused to go to ER;torn shoulder ligaments      Risk for fall due to : No Fall Risks No Fall Risks No Fall Risks No Fall Risks No Fall Risks  Follow up Education provided;Falls prevention discussed Falls prevention discussed Falls prevention discussed Falls prevention discussed Falls prevention discussed    MEDICARE RISK AT HOME:  Medicare Risk at Home - 07/24/23 1441     Any stairs in or around the home? Yes   ramp   If so, are there any without handrails? Yes    Home free of loose throw rugs in walkways, pet beds, electrical cords, etc? Yes    Adequate lighting in your home to reduce risk of falls? Yes    Life alert? No    Use of a cane, walker or w/c? Yes    Grab bars in the bathroom? Yes    Shower chair or bench in shower? No    Elevated toilet seat or a handicapped toilet? Yes             TIMED UP AND GO:  Was the test performed?  No    Cognitive Function:        07/24/2023    2:53 PM  6CIT Screen  What Year? 0 points  What month? 0 points  What time? 0 points  Count back from 20 0 points  Months in reverse 4 points  Repeat phrase 6 points  Total Score 10 points    Immunizations Immunization History  Administered Date(s) Administered   Influenza,inj,Quad PF,6+ Mos 08/15/2017, 08/21/2018, 09/16/2019, 10/02/2020, 09/12/2022   Influenza-Unspecified 08/19/2014   PFIZER(Purple Top)SARS-COV-2 Vaccination 03/10/2020, 04/10/2020   Pneumococcal Conjugate-13 08/15/2017   Pneumococcal Polysaccharide-23 02/20/2010, 02/18/2018   Tdap 06/29/2008, 11/18/2018, 03/07/2023    TDAP status: Up to date  Flu Vaccine status: Up to date  Pneumococcal vaccine status: Up to date  Covid-19 vaccine status: Completed vaccines  Qualifies for Shingles Vaccine? Yes   Zostavax completed No   Shingrix Completed?: No.    Education has been provided regarding  the importance of this vaccine. Patient has been advised to call insurance company to determine out of pocket expense if they have not yet received this vaccine. Advised may also receive vaccine at local pharmacy or Health Dept. Verbalized acceptance and understanding.  Screening Tests Health Maintenance  Topic Date Due   Zoster Vaccines- Shingrix (1 of 2) Never done   COVID-19 Vaccine (3 - Pfizer risk series) 05/08/2020   FOOT EXAM  06/12/2023   INFLUENZA VACCINE  07/24/2023   HEMOGLOBIN A1C  09/12/2023   Diabetic kidney evaluation - Urine ACR  09/13/2023   OPHTHALMOLOGY EXAM  10/09/2023   Diabetic kidney evaluation - eGFR measurement  06/18/2024   Medicare Annual Wellness (AWV)  07/23/2024   Fecal DNA (Cologuard)  07/02/2025   DTaP/Tdap/Td (4 - Td or Tdap) 03/06/2033   Hepatitis C Screening  Completed   HIV Screening  Completed   HPV VACCINES  Aged Out    Health Maintenance  Health Maintenance Due  Topic Date Due   Zoster Vaccines- Shingrix (1 of 2) Never done   COVID-19 Vaccine (3 - Pfizer risk series) 05/08/2020   FOOT EXAM  06/12/2023   INFLUENZA VACCINE  07/24/2023    Colorectal cancer screening: Type of screening: Cologuard. Completed yes. Repeat every 3 years  Lung Cancer Screening: (Low Dose CT Chest recommended if Age 75-80 years, 20 pack-year currently smoking OR have quit w/in 15years.) does not qualify.   Lung Cancer Screening Referral: no  Additional Screening:  Hepatitis C Screening: does not qualify; Completed yes  Vision Screening: Recommended annual ophthalmology exams for early detection of glaucoma and other disorders of the eye. Is the patient up to date with their annual eye exam?  Yes  Who is the provider or what is the name of the office in which the patient attends annual eye exams? [facility] If pt is not established with a provider, would they like to be referred to a provider to establish care? No .   Dental Screening: Recommended annual  dental exams for proper oral hygiene  Diabetic Foot Exam: n/a  Community Resource Referral / Chronic Care Management: CRR required this visit?  No   CCM required this visit?  No     Plan:     I have personally reviewed and noted the following in the patient's chart:   Medical and social history Use of alcohol, tobacco or illicit drugs  Current medications and supplements including opioid prescriptions. Patient is not currently taking opioid prescriptions. Functional ability and status Nutritional status Physical activity Advanced directives List of other physicians Hospitalizations, surgeries, and ER visits in previous 12 months Vitals Screenings to include cognitive, depression, and falls Referrals and appointments  In addition, I have reviewed and discussed with patient certain preventive protocols, quality metrics, and best practice recommendations. A written personalized care plan for preventive services as well as general preventive health recommendations were provided to patient.     Sue Lush, LPN   12/28/1094   After Visit Summary: (MyChart) Due to this being a telephonic visit, the after visit summary with patients personalized plan was offered to patient via MyChart   Nurse Notes: The patient (and wife) states they are doing alright and has no concerns or questions at this time.

## 2023-07-25 ENCOUNTER — Other Ambulatory Visit: Payer: Self-pay | Admitting: Family Medicine

## 2023-07-25 DIAGNOSIS — E785 Hyperlipidemia, unspecified: Secondary | ICD-10-CM

## 2023-07-25 DIAGNOSIS — F341 Dysthymic disorder: Secondary | ICD-10-CM

## 2023-07-25 DIAGNOSIS — F419 Anxiety disorder, unspecified: Secondary | ICD-10-CM

## 2023-07-25 DIAGNOSIS — E669 Obesity, unspecified: Secondary | ICD-10-CM

## 2023-07-28 NOTE — Progress Notes (Signed)
Name: [name]   MRN: [id number]    DOB: Dec 05, 1971   Date:07/29/2023       Progress Note  Subjective  Chief Complaint  Follow up and Surgical Clearance  HPI  MDD recurrent/anxiety : relationship with his wife has improved . He has been taking Seroquel, trazodone and Duloxetine and current regiment is working well for him . He has sleeping well. Wife notices when he skips his duloxetine , he gets irritable    DMII: it was diet controlled after bariatric surgery 2013, however levels gradually went up again and we resumed Metformin followed by Rybelsus, last A1C went from  6.6 %  to 6.4 % to 6.5%, we switched from Rybelsus to [facility] in Dec 2023 he ran out of Metformin over the past week. Weight is down again a little over 11 lbs . A1C today was 6.1 %  He denies polyphagia, polydipsia or polyuria.  Eye exam is up to date. BP is normal even though he is now off Valsartan due to low bp . He is on Crestor daily and tolerating it well   Muscle Cramps: he was doing well, he is now on crestor and seems to tolerate it better. Taking flexeril as needed and seems to work    Hypothyroidism: taking same dose of Synthroid for many months ,however it went very low after that very high, we adjusted the dose and last TSH still over 8  . He is current taking 75 mcg daily , he does not skip doses    Chronic low back pain :  Pain is about 8 /10 , stable on Lyrica and Flexeril, discussed trying otc Tylenol 500 mg max of 3 g per day to keep below toxic levels. He was on hydrocodone in 2023 when medicare stopped covering medication and pain level has been about the same He is also having left shoulder pain that will require surgery   Left foot pain: he tripped on a palate and fell backwards hitting head on the ground , it happened yesterday, he did not pass out, but left foot has been painful and swollen , 3rd , 4th and 5 th toes left foot and forefoot and mid foot also swollen and tender, he is patient of Dr.  Ether Griffins. I reached out to him today and Dr. Ether Griffins will work him in, advised ice and avoid bearing weight until seen by his podiatrist    Neuropathy: he has bilateral daily pain on both legs, secondary to chronic back problems, aching like, he denies burning or prickly sensation, Lyrica helps. Pain level is up on left foot and is back on disability, Pain on foot at this time is zero / 10    History of bariatric surgery/Morbid obesity : surgery 09/2012 was he is morbidly obese his weight at the time was 396 lbs - lowest weight after surgery was 275 lbs, he gradually gained weight back, it stabilized at the 340's, it was in the mid 330's for a while, he is now on Rybelsus , started Dec 2022 and had lost down to 306 lbs but it went up to 330 lbs, we switched on Mounjarno Dec 2023 and weight today is down to 276.8 lbs. Back to his baseline weight    History of syncope: he used to take Diovan  320 mg daily for bp but had a syncopal episode in June and while at [facility] , bp was very low, acute on chronic kidney disease and medication was suspended. BP today  still towards low end of normal without medications.   Controlled gout; taking allopurinol , no recent episodes.  Patient Active Problem List   Diagnosis Date Noted   Syncope and collapse 06/17/2023   AKI (acute kidney injury) (HCC) 06/17/2023   Chronic pain 06/17/2023   Hypotension 06/17/2023   Polypharmacy 06/17/2023   Aspiration pneumonia (HCC) 01/04/2023   Acute respiratory failure with hypoxia (HCC) 01/04/2023   Anxiety and depression 01/04/2023   Type 2 diabetes mellitus with peripheral neuropathy (HCC) 01/04/2023   Essential hypertension 01/04/2023   Pain in joint of left shoulder 11/29/2022   Anemia, unspecified 06/12/2016   B12 deficiency 06/11/2016   Allergic conjunctivitis 06/11/2016   Muscle cramps 06/11/2016   Hypertension, benign 11/03/2015   Dyslipidemia 11/02/2015   Acquired nystagmus 08/01/2015   Chronic bilateral low back  pain with bilateral sciatica 06/29/2015   Carpal tunnel syndrome 06/29/2015   Osteoarthritis 06/29/2015   Claustrophobia 06/29/2015   Foot drop, left 06/29/2015   Hiatal hernia 06/29/2015   H/O diabetes mellitus 06/29/2015   H/O testicular cancer 06/29/2015   Acquired hypothyroidism 06/29/2015   Dysmetabolic syndrome 06/29/2015   Obesity, Class III, BMI 40-49.9 (morbid obesity) (HCC) 06/29/2015   OSA on CPAP 06/29/2015   Allergic rhinitis 06/29/2015   Neuropathy 06/29/2015   Seborrheic keratoses 06/29/2015   Vitamin D deficiency 06/29/2015   Bariatric surgery status 02/20/2012    Past Surgical History:  Procedure Laterality Date   BACK SURGERY     X 2   HERNIA REPAIR     left side and umbilical hernia repair   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   SHOULDER ARTHROSCOPY W/ ROTATOR CUFF REPAIR Left 01/02/2023   URETEROTOMY Left 12/23/1998    Family History  Problem Relation Age of Onset   Coronary artery disease Mother    Cancer Father        Melanoma and Testicular   Diabetes Father     Social History   Tobacco Use   Smoking status: Former    Current packs/day: 0.00    Average packs/day: 3.0 packs/day for 8.0 years (24.0 ttl pk-yrs)    Types: Cigarettes, Cigars, Pipe    Start date: 06/28/1984    Quit date: 12/24/1991    Years since quitting: 31.6   Smokeless tobacco: Former    Types: Chew    Quit date: 12/24/1991  Substance Use Topics   Alcohol use: No    Alcohol/week: 0.0 standard drinks of alcohol     Current Outpatient Medications:    albuterol (VENTOLIN HFA) 108 (90 Base) MCG/ACT inhaler, Inhale 2 puffs into the lungs every 6 (six) hours as needed for wheezing or shortness of breath., Disp: 8 g, Rfl: 2   ALLERGY RELIEF 180 MG tablet, TAKE 1 TABLET(180 MG) BY MOUTH DAILY, Disp: 90 tablet, Rfl: 0   fluticasone (FLONASE) 50 MCG/ACT nasal spray, Place 2 sprays into both nostrils daily., Disp: 16 g, Rfl: 6   levothyroxine (SYNTHROID) 75 MCG tablet, Take 1 tablet  (75 mcg total) by mouth daily before breakfast. (Patient taking differently: Take 75 mcg by mouth at bedtime.), Disp: 90 tablet, Rfl: 0   Magnesium Oxide 500 MG TABS, Take 1 tablet by mouth once daily, Disp: 90 tablet, Rfl: 0   sildenafil (VIAGRA) 100 MG tablet, TAKE 1 TABLET(100 MG) BY MOUTH DAILY AS NEEDED FOR ERECTILE DYSFUNCTION, Disp: 30 tablet, Rfl: 0   testosterone cypionate (DEPOTESTOSTERONE CYPIONATE) 200 MG/ML injection, ADMINISTER 1 ML IN THE MUSCLE EVERY 14 DAYS, Disp: 10  mL, Rfl: 0   allopurinol (ZYLOPRIM) 100 MG tablet, Take 1 tablet (100 mg total) by mouth 2 (two) times daily., Disp: 180 tablet, Rfl: 1   busPIRone (BUSPAR) 5 MG tablet, TAKE 1 TO 2 TABLETS BY MOUTH THREE TIMES DAILY, Disp: 180 tablet, Rfl: 1   cyclobenzaprine (FLEXERIL) 10 MG tablet, Take 1 tablet (10 mg total) by mouth at bedtime. TAKE 1 TABLET BY MOUTH AT BEDTIME, Disp: 90 tablet, Rfl: 1   DULoxetine (CYMBALTA) 60 MG capsule, Take 1 capsule (60 mg total) by mouth daily., Disp: 90 capsule, Rfl: 1   metFORMIN (GLUCOPHAGE-XR) 750 MG 24 hr tablet, Take 1 tablet (750 mg total) by mouth daily with breakfast., Disp: 90 tablet, Rfl: 1   pregabalin (LYRICA) 225 MG capsule, Take 1 capsule (225 mg total) by mouth 2 (two) times daily., Disp: 180 capsule, Rfl: 1   QUEtiapine (SEROQUEL) 25 MG tablet, Take 1 tablet (25 mg total) by mouth at bedtime., Disp: 90 tablet, Rfl: 1   rosuvastatin (CRESTOR) 5 MG tablet, Take 1 tablet (5 mg total) by mouth daily., Disp: 90 tablet, Rfl: 1   tirzepatide (MOUNJARO) 10 MG/0.5ML Pen, Inject 10 mg into the skin once a week., Disp: 6 mL, Rfl: 0   traZODone (DESYREL) 100 MG tablet, TAKE 1 & 1/2 (ONE & ONE-HALF) TABLETS BY MOUTH AT BEDTIME, Disp: 135 tablet, Rfl: 1  Allergies  Allergen Reactions   Morphine Itching    I personally reviewed active problem list, medication list, allergies, family history with the patient/caregiver today.   ROS  Constitutional: Negative for fever or weight  change.  Respiratory: Negative for cough and shortness of breath.   Cardiovascular: Negative for chest pain or palpitations.  Gastrointestinal: Negative for abdominal pain, no bowel changes.  Musculoskeletal: Negative for gait problem or joint swelling.  Skin: Negative for rash.  Neurological: Negative for dizziness or headache.  No other specific complaints in a complete review of systems (except as listed in HPI above).   Objective  Vitals:   07/29/23 0810  BP: 116/82  Pulse: 83  Resp: 18  Temp: 97.7 F (36.5 C)  TempSrc: Oral  SpO2: 98%  Weight: 276 lb 12.8 oz (125.6 kg)  Height: 6' (1.829 m)    Body mass index is 37.54 kg/m.  Physical Exam  Constitutional: Patient appears well-developed and well-nourished. Obese  No distress.  HEENT: head atraumatic, normocephalic, pupils equal and reactive to light, neck supple, Cardiovascular: Normal rate, regular rhythm and normal heart sounds.  No murmur heard. No BLE edema. Pulmonary/Chest: Effort normal and breath sounds normal. No respiratory distress. Abdominal: Soft.  There is no tenderness. Muscular skeletal: left foot looks broken see diabetic foot exam  Psychiatric: Patient has a normal mood and affect. behavior is normal. Judgment and thought content normal.   Recent Results (from the past 2160 hour(s))  Basic metabolic panel     Status: Abnormal   Collection Time: 06/17/23  5:18 PM  Result Value Ref Range   Sodium 134 (L) 135 - 145 mmol/L   Potassium 4.7 3.5 - 5.1 mmol/L   Chloride 98 98 - 111 mmol/L   CO2 25 22 - 32 mmol/L   Glucose, Bld 129 (H) 70 - 99 mg/dL    Comment: Glucose reference range applies only to samples taken after fasting for at least 8 hours.   BUN 44 (H) 6 - 20 mg/dL   Creatinine, Ser 4.09 (H) 0.61 - 1.24 mg/dL   Calcium 8.5 (L) 8.9 - 10.3  mg/dL   GFR, Estimated 21 (L) >60 mL/min    Comment: (NOTE) Calculated using the CKD-EPI Creatinine Equation (2021)    Anion gap 11 5 - 15    Comment:  Performed at [facility], [address] [place], [place] [address]  CBC     Status: Abnormal   Collection Time: 06/17/23  5:18 PM  Result Value Ref Range   WBC 10.6 (H) 4.0 - 10.5 K/uL   RBC 5.84 (H) 4.22 - 5.81 MIL/uL   Hemoglobin 16.8 13.0 - 17.0 g/dL   HCT 32.4 (H) 40.1 - 02.7 %   MCV 90.1 80.0 - 100.0 fL   MCH 28.8 26.0 - 34.0 pg   MCHC 31.9 30.0 - 36.0 g/dL   RDW 25.3 66.4 - 40.3 %   Platelets 196 150 - 400 K/uL   nRBC 0.0 0.0 - 0.2 %    Comment: Performed at [facility], [address] [place], [place] [address]  Hepatic function panel     Status: Abnormal   Collection Time: 06/17/23  5:18 PM  Result Value Ref Range   Total Protein 6.4 (L) 6.5 - 8.1 g/dL   Albumin 3.8 3.5 - 5.0 g/dL   AST 23 15 - 41 U/L   ALT 15 0 - 44 U/L   Alkaline Phosphatase 101 38 - 126 U/L   Total Bilirubin 1.3 (H) 0.3 - 1.2 mg/dL   Bilirubin, Direct 0.2 0.0 - 0.2 mg/dL   Indirect Bilirubin 1.1 (H) 0.3 - 0.9 mg/dL    Comment: Performed at [facility], [address] [place], [place] [address]  Magnesium     Status: None   Collection Time: 06/17/23  5:18 PM  Result Value Ref Range   Magnesium 2.4 1.7 - 2.4 mg/dL    Comment: Performed at [facility], [address] [place], [place] [address]  Troponin I (High Sensitivity)     Status: None   Collection Time: 06/17/23  5:18 PM  Result Value Ref Range   Troponin I (High Sensitivity) 9 <18 ng/L    Comment: (NOTE) Elevated high sensitivity troponin I (hsTnI) values and significant  changes across serial measurements may suggest ACS but many other  chronic and acute conditions are known to elevate hsTnI results.  Refer to the "Links" section for chest pain algorithms and additional  guidance. Performed at [facility], [address] [place], [place] [address]   TSH     Status: Abnormal   Collection Time: 06/17/23  5:18 PM  Result Value Ref Range   TSH 8.432 (H) 0.350 - 4.500 uIU/mL     Comment: Performed by a 3rd Generation assay with a functional sensitivity of <=0.01 uIU/mL. Performed at [facility], [address] [place], [place] [address]   T4, free     Status: None   Collection Time: 06/17/23  5:18 PM  Result Value Ref Range   Free T4 0.65 0.61 - 1.12 ng/dL    Comment: (NOTE) Biotin ingestion may interfere with free T4 tests. If the results are inconsistent with the TSH level, previous test results, or the clinical presentation, then consider biotin interference. If needed, order repeat testing after stopping biotin. Performed at [facility], [address] [place], [place] [address]   Urinalysis, Routine w reflex microscopic -Urine, Clean Catch     Status: Abnormal   Collection Time: 06/17/23  6:02 PM  Result Value Ref Range   Color, Urine YELLOW (A) YELLOW  APPearance CLEAR (A) CLEAR   Specific Gravity, Urine 1.018 1.005 - 1.030   pH 5.0 5.0 - 8.0   Glucose, UA NEGATIVE NEGATIVE mg/dL   Hgb urine dipstick NEGATIVE NEGATIVE   Bilirubin Urine NEGATIVE NEGATIVE   Ketones, ur 5 (A) NEGATIVE mg/dL   Protein, ur NEGATIVE NEGATIVE mg/dL   Nitrite NEGATIVE NEGATIVE   Leukocytes,Ua NEGATIVE NEGATIVE    Comment: Performed at [facility], [address] [place], [place] [address]  CBG monitoring, ED     Status: Abnormal   Collection Time: 06/17/23  9:56 PM  Result Value Ref Range   Glucose-Capillary 209 (H) 70 - 99 mg/dL    Comment: Glucose reference range applies only to samples taken after fasting for at least 8 hours.  D-dimer, quantitative     Status: None   Collection Time: 06/17/23 10:05 PM  Result Value Ref Range   D-Dimer, Quant <0.27 0.00 - 0.50 ug/mL-FEU    Comment: (NOTE) At the manufacturer cut-off value of 0.5 g/mL FEU, this assay has a negative predictive value of 95-100%.This assay is intended for use in conjunction with a clinical pretest probability (PTP) assessment model to exclude pulmonary embolism  (PE) and deep venous thrombosis (DVT) in outpatients suspected of PE or DVT. Results should be correlated with clinical presentation. Performed at [facility], [address] [place], [place] [address]   Basic metabolic panel     Status: Abnormal   Collection Time: 06/18/23  5:44 AM  Result Value Ref Range   Sodium 139 135 - 145 mmol/L   Potassium 4.0 3.5 - 5.1 mmol/L   Chloride 108 98 - 111 mmol/L   CO2 25 22 - 32 mmol/L   Glucose, Bld 100 (H) 70 - 99 mg/dL    Comment: Glucose reference range applies only to samples taken after fasting for at least 8 hours.   BUN 40 (H) 6 - 20 mg/dL   Creatinine, Ser 1.47 (H) 0.61 - 1.24 mg/dL   Calcium 7.4 (L) 8.9 - 10.3 mg/dL   GFR, Estimated 39 (L) >60 mL/min    Comment: (NOTE) Calculated using the CKD-EPI Creatinine Equation (2021)    Anion gap 6 5 - 15    Comment: Performed at [facility], [address] [place], [place] [address]  CBC     Status: Abnormal   Collection Time: 06/18/23  5:44 AM  Result Value Ref Range   WBC 7.8 4.0 - 10.5 K/uL   RBC 4.70 4.22 - 5.81 MIL/uL   Hemoglobin 13.8 13.0 - 17.0 g/dL   HCT 21.3 08.6 - 57.8 %   MCV 90.9 80.0 - 100.0 fL   MCH 29.4 26.0 - 34.0 pg   MCHC 32.3 30.0 - 36.0 g/dL   RDW 46.9 62.9 - 52.8 %   Platelets 134 (L) 150 - 400 K/uL    Comment: REPEATED TO VERIFY   nRBC 0.0 0.0 - 0.2 %    Comment: Performed at [facility], [address] [place], [place] [address]  CBG monitoring, ED     Status: Abnormal   Collection Time: 06/18/23  9:04 AM  Result Value Ref Range   Glucose-Capillary 117 (H) 70 - 99 mg/dL    Comment: Glucose reference range applies only to samples taken after fasting for at least 8 hours.  CBG monitoring, ED     Status: Abnormal   Collection Time: 06/18/23 11:12 AM  Result Value Ref Range   Glucose-Capillary 142 (H) 70 -  99 mg/dL    Comment: Glucose reference range applies only to samples taken after fasting for at least 8 hours.   ECHOCARDIOGRAM COMPLETE     Status: None   Collection Time: 06/18/23 12:39 PM  Result Value Ref Range   Weight 4,624 oz   Height 69 in   BP 123/86 mmHg   Ao pk vel 1.38 m/s   AV Area VTI 2.97 cm2   AR max vel 2.66 cm2   AV Mean grad 4.0 mmHg   AV Peak grad 7.6 mmHg   S' Lateral 3.60 cm   AV Area mean vel 2.58 cm2   Area-P 1/2 2.62 cm2   MV VTI 3.57 cm2   Est EF 50 - 55%   CBG monitoring, ED     Status: Abnormal   Collection Time: 06/18/23  1:09 PM  Result Value Ref Range   Glucose-Capillary 107 (H) 70 - 99 mg/dL    Comment: Glucose reference range applies only to samples taken after fasting for at least 8 hours.  CBG monitoring, ED     Status: Abnormal   Collection Time: 06/18/23  4:20 PM  Result Value Ref Range   Glucose-Capillary 211 (H) 70 - 99 mg/dL    Comment: Glucose reference range applies only to samples taken after fasting for at least 8 hours.  Glucose, capillary     Status: Abnormal   Collection Time: 06/18/23  8:47 PM  Result Value Ref Range   Glucose-Capillary 106 (H) 70 - 99 mg/dL    Comment: Glucose reference range applies only to samples taken after fasting for at least 8 hours.  Comprehensive metabolic panel     Status: Abnormal   Collection Time: 06/19/23  3:26 AM  Result Value Ref Range   Sodium 139 135 - 145 mmol/L   Potassium 4.7 3.5 - 5.1 mmol/L   Chloride 110 98 - 111 mmol/L   CO2 26 22 - 32 mmol/L   Glucose, Bld 112 (H) 70 - 99 mg/dL    Comment: Glucose reference range applies only to samples taken after fasting for at least 8 hours.   BUN 25 (H) 6 - 20 mg/dL   Creatinine, Ser 8.29 (H) 0.61 - 1.24 mg/dL   Calcium 7.8 (L) 8.9 - 10.3 mg/dL   Total Protein 4.9 (L) 6.5 - 8.1 g/dL   Albumin 2.8 (L) 3.5 - 5.0 g/dL   AST 15 15 - 41 U/L   ALT 12 0 - 44 U/L   Alkaline Phosphatase 84 38 - 126 U/L   Total Bilirubin 0.9 0.3 - 1.2 mg/dL   GFR, Estimated >56 >21 mL/min    Comment: (NOTE) Calculated using the CKD-EPI Creatinine Equation (2021)    Anion  gap 3 (L) 5 - 15    Comment: Performed at [facility], [address] [place], [place] [address]  CBC     Status: Abnormal   Collection Time: 06/19/23  3:26 AM  Result Value Ref Range   WBC 5.7 4.0 - 10.5 K/uL   RBC 4.70 4.22 - 5.81 MIL/uL   Hemoglobin 13.6 13.0 - 17.0 g/dL   HCT 78.4 69.6 - 29.5 %   MCV 91.3 80.0 - 100.0 fL   MCH 28.9 26.0 - 34.0 pg   MCHC 31.7 30.0 - 36.0 g/dL   RDW 28.4 13.2 - 44.0 %   Platelets 133 (L) 150 - 400 K/uL   nRBC 0.0 0.0 - 0.2 %    Comment: Performed at [facility], [address] [place], [place] [address]  Magnesium     Status: None   Collection Time: 06/19/23  3:26 AM  Result Value Ref Range   Magnesium 2.2 1.7 - 2.4 mg/dL    Comment: Performed at [facility], [address] [place], [place] [address]  Glucose, capillary     Status: Abnormal   Collection Time: 06/19/23  8:53 AM  Result Value Ref Range   Glucose-Capillary 114 (H) 70 - 99 mg/dL    Comment: Glucose reference range applies only to samples taken after fasting for at least 8 hours.  POCT HgB A1C     Status: Abnormal   Collection Time: 07/29/23  8:14 AM  Result Value Ref Range   Hemoglobin A1C 6.1 (A) 4.0 - 5.6 %   HbA1c POC (<> result, manual entry)     HbA1c, POC (prediabetic range)     HbA1c, POC (controlled diabetic range)      Diabetic Foot Exam: Diabetic Foot Exam - Simple   Simple Foot Form Visual Inspection See comments: Yes Sensation Testing Intact to touch and monofilament testing bilaterally: Yes Pulse Check Posterior Tibialis and Dorsalis pulse intact bilaterally: Yes Comments Swollen and tender left foot, 4th and 5 th toe have ecchymosis, tender during palpation of 3rd, 4th and 5th metatarsal bones       PHQ2/9:    07/29/2023    8:13 AM 07/24/2023    2:45 PM 03/12/2023    8:35 AM 01/09/2023    9:36 AM 12/12/2022    7:49 AM  Depression screen PHQ 2/9  Decreased Interest 0 0 0 0 0  Down, Depressed, Hopeless 0 0 0 0 0  PHQ  - 2 Score 0 0 0 0 0  Altered sleeping 0  0 3 0  Tired, decreased energy 0  0 0 0  Change in appetite 0  0 0 0  Feeling bad or failure about yourself  0  0 0 0  Trouble concentrating 0  0 0 0  Moving slowly or fidgety/restless 0  0 0 0  Suicidal thoughts 0  0 0 0  PHQ-9 Score 0  0 3 0    phq 9 is negative   Fall Risk:    07/29/2023    8:13 AM 07/24/2023    2:39 PM 03/12/2023    8:35 AM 01/09/2023    9:42 AM 12/12/2022    7:49 AM  Fall Risk   Falls in the past year? 1 1 1 1 1   Number falls in past yr: 1 1 1  0 0  Injury with Fall? 1 1 1 1 1   Comment  head fall but refused to go to ER;torn shoulder ligaments     Risk for fall due to : History of fall(s);Orthopedic patient No Fall Risks No Fall Risks No Fall Risks No Fall Risks  Follow up Falls prevention discussed;Education provided;Falls evaluation completed Education provided;Falls prevention discussed Falls prevention discussed Falls prevention discussed Falls prevention discussed     Functional Status Survey: Is the patient deaf or have difficulty hearing?: No Does the patient have difficulty seeing, even when wearing glasses/contacts?: Yes Does the patient have difficulty concentrating, remembering, or making decisions?: No Does the patient have difficulty walking or climbing stairs?: Yes Does the patient have difficulty dressing or bathing?: No Does the patient have difficulty doing errands alone such as visiting a doctor's office or shopping?: No    Assessment & Plan  1. Dyslipidemia associated with type 2  diabetes mellitus (HCC)  - HM Diabetes Foot Exam - POCT HgB A1C - COMPLETE METABOLIC PANEL WITH GFR - metFORMIN (GLUCOPHAGE-XR) 750 MG 24 hr tablet; Take 1 tablet (750 mg total) by mouth daily with breakfast.  Dispense: 90 tablet; Refill: 1 - tirzepatide (MOUNJARO) 10 MG/0.5ML Pen; Inject 10 mg into the skin once a week.  Dispense: 6 mL; Refill: 0  2. Acute kidney injury superimposed on chronic kidney disease  (HCC)  - COMPLETE METABOLIC PANEL WITH GFR  3. Morbid obesity (HCC)  BMI over 35 with co-morbidities like DM, dyslipidemia, joint pains  4. Thrombocytopenia (HCC)  - CBC with Differential/Platelet  5. Acquired hypothyroidism  - TSH  6. Chronic bilateral low back pain with bilateral sciatica   7. B12 deficiency  - B12 and Folate Panel - cyanocobalamin (VITAMIN B12) injection 1,000 mcg  8. Controlled gout  - allopurinol (ZYLOPRIM) 100 MG tablet; Take 1 tablet (100 mg total) by mouth 2 (two) times daily.  Dispense: 180 tablet; Refill: 1  9. Dyslipidemia  - rosuvastatin (CRESTOR) 5 MG tablet; Take 1 tablet (5 mg total) by mouth daily.  Dispense: 90 tablet; Refill: 1  10. Major depression, recurrent, chronic (HCC)  - busPIRone (BUSPAR) 5 MG tablet; TAKE 1 TO 2 TABLETS BY MOUTH THREE TIMES DAILY  Dispense: 180 tablet; Refill: 1 - QUEtiapine (SEROQUEL) 25 MG tablet; Take 1 tablet (25 mg total) by mouth at bedtime.  Dispense: 90 tablet; Refill: 1 - pregabalin (LYRICA) 225 MG capsule; Take 1 capsule (225 mg total) by mouth 2 (two) times daily.  Dispense: 180 capsule; Refill: 1 - DULoxetine (CYMBALTA) 60 MG capsule; Take 1 capsule (60 mg total) by mouth daily.  Dispense: 90 capsule; Refill: 1  11. Muscle cramps  - cyclobenzaprine (FLEXERIL) 10 MG tablet; Take 1 tablet (10 mg total) by mouth at bedtime. TAKE 1 TABLET BY MOUTH AT BEDTIME  Dispense: 90 tablet; Refill: 1  12. Other insomnia  - traZODone (DESYREL) 100 MG tablet; TAKE 1 & 1/2 (ONE & ONE-HALF) TABLETS BY MOUTH AT BEDTIME  Dispense: 135 tablet; Refill: 1

## 2023-07-29 ENCOUNTER — Ambulatory Visit (INDEPENDENT_AMBULATORY_CARE_PROVIDER_SITE_OTHER): Payer: Medicare Other | Admitting: Family Medicine

## 2023-07-29 ENCOUNTER — Encounter: Payer: Self-pay | Admitting: Family Medicine

## 2023-07-29 VITALS — BP 116/82 | HR 83 | Temp 97.7°F | Resp 18 | Ht 72.0 in | Wt 276.8 lb

## 2023-07-29 DIAGNOSIS — E039 Hypothyroidism, unspecified: Secondary | ICD-10-CM

## 2023-07-29 DIAGNOSIS — N189 Chronic kidney disease, unspecified: Secondary | ICD-10-CM

## 2023-07-29 DIAGNOSIS — Z7984 Long term (current) use of oral hypoglycemic drugs: Secondary | ICD-10-CM

## 2023-07-29 DIAGNOSIS — N179 Acute kidney failure, unspecified: Secondary | ICD-10-CM

## 2023-07-29 DIAGNOSIS — E538 Deficiency of other specified B group vitamins: Secondary | ICD-10-CM

## 2023-07-29 DIAGNOSIS — G8929 Other chronic pain: Secondary | ICD-10-CM

## 2023-07-29 DIAGNOSIS — D696 Thrombocytopenia, unspecified: Secondary | ICD-10-CM | POA: Diagnosis not present

## 2023-07-29 DIAGNOSIS — E785 Hyperlipidemia, unspecified: Secondary | ICD-10-CM

## 2023-07-29 DIAGNOSIS — E1169 Type 2 diabetes mellitus with other specified complication: Secondary | ICD-10-CM

## 2023-07-29 DIAGNOSIS — Z7985 Long-term (current) use of injectable non-insulin antidiabetic drugs: Secondary | ICD-10-CM | POA: Diagnosis not present

## 2023-07-29 DIAGNOSIS — M5442 Lumbago with sciatica, left side: Secondary | ICD-10-CM

## 2023-07-29 DIAGNOSIS — R252 Cramp and spasm: Secondary | ICD-10-CM

## 2023-07-29 DIAGNOSIS — F339 Major depressive disorder, recurrent, unspecified: Secondary | ICD-10-CM

## 2023-07-29 DIAGNOSIS — M109 Gout, unspecified: Secondary | ICD-10-CM

## 2023-07-29 DIAGNOSIS — I152 Hypertension secondary to endocrine disorders: Secondary | ICD-10-CM

## 2023-07-29 DIAGNOSIS — G4709 Other insomnia: Secondary | ICD-10-CM

## 2023-07-29 LAB — CBC WITH DIFFERENTIAL/PLATELET
Absolute Monocytes: 525 cells/uL (ref 200–950)
Basophils Absolute: 51 cells/uL (ref 0–200)
Basophils Relative: 0.8 %
Eosinophils Absolute: 237 cells/uL (ref 15–500)
Eosinophils Relative: 3.7 %
HCT: 47.5 % (ref 38.5–50.0)
Hemoglobin: 15.5 g/dL (ref 13.2–17.1)
Lymphs Abs: 1165 cells/uL (ref 850–3900)
MCH: 29.4 pg (ref 27.0–33.0)
MCHC: 32.6 g/dL (ref 32.0–36.0)
MCV: 90 fL (ref 80.0–100.0)
MPV: 11.1 fL (ref 7.5–12.5)
Monocytes Relative: 8.2 %
Neutro Abs: 4422 cells/uL (ref 1500–7800)
Neutrophils Relative %: 69.1 %
Platelets: 209 10*3/uL (ref 140–400)
RBC: 5.28 10*6/uL (ref 4.20–5.80)
RDW: 12.5 % (ref 11.0–15.0)
Total Lymphocyte: 18.2 %
WBC: 6.4 10*3/uL (ref 3.8–10.8)

## 2023-07-29 LAB — POCT GLYCOSYLATED HEMOGLOBIN (HGB A1C): Hemoglobin A1C: 6.1 % — AB (ref 4.0–5.6)

## 2023-07-29 MED ORDER — TIRZEPATIDE 10 MG/0.5ML ~~LOC~~ SOAJ
10.0000 mg | SUBCUTANEOUS | 0 refills | Status: DC
Start: 2023-07-29 — End: 2023-11-26

## 2023-07-29 MED ORDER — CYCLOBENZAPRINE HCL 10 MG PO TABS: 10.0000 mg | ORAL_TABLET | Freq: Every day | ORAL | 1 refills | Status: DC

## 2023-07-29 MED ORDER — PREGABALIN 225 MG PO CAPS
225.0000 mg | ORAL_CAPSULE | Freq: Two times a day (BID) | ORAL | 1 refills | Status: DC
Start: 2023-07-29 — End: 2023-11-26

## 2023-07-29 MED ORDER — ALLOPURINOL 100 MG PO TABS
100.0000 mg | ORAL_TABLET | Freq: Two times a day (BID) | ORAL | 1 refills | Status: DC
Start: 2023-07-29 — End: 2023-11-26

## 2023-07-29 MED ORDER — METFORMIN HCL ER 750 MG PO TB24
750.0000 mg | ORAL_TABLET | Freq: Every day | ORAL | 1 refills | Status: DC
Start: 2023-07-29 — End: 2023-11-26

## 2023-07-29 MED ORDER — TRAZODONE HCL 100 MG PO TABS
ORAL_TABLET | ORAL | 1 refills | Status: DC
Start: 2023-07-29 — End: 2023-11-26

## 2023-07-29 MED ORDER — ROSUVASTATIN CALCIUM 5 MG PO TABS
5.0000 mg | ORAL_TABLET | Freq: Every day | ORAL | 1 refills | Status: DC
Start: 2023-07-29 — End: 2023-11-26

## 2023-07-29 MED ORDER — QUETIAPINE FUMARATE 25 MG PO TABS
25.0000 mg | ORAL_TABLET | Freq: Every day | ORAL | 1 refills | Status: DC
Start: 2023-07-29 — End: 2023-11-26

## 2023-07-29 MED ORDER — DULOXETINE HCL 60 MG PO CPEP
60.0000 mg | ORAL_CAPSULE | Freq: Every day | ORAL | 1 refills | Status: DC
Start: 2023-07-29 — End: 2023-11-26

## 2023-07-29 MED ORDER — CYANOCOBALAMIN 1000 MCG/ML IJ SOLN
1000.0000 ug | Freq: Once | INTRAMUSCULAR | Status: AC
Start: 2023-07-29 — End: 2023-07-29
  Administered 2023-07-29: 1000 ug via INTRAMUSCULAR

## 2023-07-29 MED ORDER — BUSPIRONE HCL 5 MG PO TABS: ORAL_TABLET | ORAL | 1 refills | Status: DC

## 2023-08-06 ENCOUNTER — Ambulatory Visit: Payer: Medicare Other | Admitting: Family Medicine

## 2023-08-06 NOTE — Progress Notes (Deleted)
Name: [name]   MRN: [id number]    DOB: 1971/01/04   Date:08/06/2023       Progress Note  Subjective  Chief Complaint  Pre-Op  HPI  *** Patient Active Problem List   Diagnosis Date Noted   Syncope and collapse 06/17/2023   AKI (acute kidney injury) (HCC) 06/17/2023   Chronic pain 06/17/2023   Hypotension 06/17/2023   Polypharmacy 06/17/2023   Aspiration pneumonia (HCC) 01/04/2023   Acute respiratory failure with hypoxia (HCC) 01/04/2023   Anxiety and depression 01/04/2023   Type 2 diabetes mellitus with peripheral neuropathy (HCC) 01/04/2023   Essential hypertension 01/04/2023   Pain in joint of left shoulder 11/29/2022   Anemia, unspecified 06/12/2016   B12 deficiency 06/11/2016   Allergic conjunctivitis 06/11/2016   Muscle cramps 06/11/2016   Hypertension, benign 11/03/2015   Dyslipidemia 11/02/2015   Acquired nystagmus 08/01/2015   Chronic bilateral low back pain with bilateral sciatica 06/29/2015   Carpal tunnel syndrome 06/29/2015   Osteoarthritis 06/29/2015   Claustrophobia 06/29/2015   Foot drop, left 06/29/2015   Hiatal hernia 06/29/2015   H/O diabetes mellitus 06/29/2015   H/O testicular cancer 06/29/2015   Acquired hypothyroidism 06/29/2015   Dysmetabolic syndrome 06/29/2015   Obesity, Class III, BMI 40-49.9 (morbid obesity) (HCC) 06/29/2015   OSA on CPAP 06/29/2015   Allergic rhinitis 06/29/2015   Neuropathy 06/29/2015   Seborrheic keratoses 06/29/2015   Vitamin D deficiency 06/29/2015   Bariatric surgery status 02/20/2012    Past Surgical History:  Procedure Laterality Date   BACK SURGERY     X 2   HERNIA REPAIR     left side and umbilical hernia repair   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   SHOULDER ARTHROSCOPY W/ ROTATOR CUFF REPAIR Left 01/02/2023   URETEROTOMY Left 12/23/1998    Family History  Problem Relation Age of Onset   Coronary artery disease Mother    Cancer Father        Melanoma and Testicular   Diabetes Father      Social History   Tobacco Use   Smoking status: Former    Current packs/day: 0.00    Average packs/day: 3.0 packs/day for 8.0 years (24.0 ttl pk-yrs)    Types: Cigarettes, Cigars, Pipe    Start date: 06/28/1984    Quit date: 12/24/1991    Years since quitting: 31.6   Smokeless tobacco: Former    Types: Chew    Quit date: 12/24/1991  Substance Use Topics   Alcohol use: No    Alcohol/week: 0.0 standard drinks of alcohol     Current Outpatient Medications:    albuterol (VENTOLIN HFA) 108 (90 Base) MCG/ACT inhaler, Inhale 2 puffs into the lungs every 6 (six) hours as needed for wheezing or shortness of breath., Disp: 8 g, Rfl: 2   ALLERGY RELIEF 180 MG tablet, TAKE 1 TABLET(180 MG) BY MOUTH DAILY, Disp: 90 tablet, Rfl: 0   allopurinol (ZYLOPRIM) 100 MG tablet, Take 1 tablet (100 mg total) by mouth 2 (two) times daily., Disp: 180 tablet, Rfl: 1   busPIRone (BUSPAR) 5 MG tablet, TAKE 1 TO 2 TABLETS BY MOUTH THREE TIMES DAILY, Disp: 180 tablet, Rfl: 1   cyclobenzaprine (FLEXERIL) 10 MG tablet, Take 1 tablet (10 mg total) by mouth at bedtime. TAKE 1 TABLET BY MOUTH AT BEDTIME, Disp: 90 tablet, Rfl: 1   DULoxetine (CYMBALTA) 60 MG capsule, Take 1 capsule (60 mg total) by mouth daily., Disp: 90 capsule, Rfl: 1   fluticasone (FLONASE) 50  MCG/ACT nasal spray, Place 2 sprays into both nostrils daily., Disp: 16 g, Rfl: 6   levothyroxine (SYNTHROID) 75 MCG tablet, Take 1 tablet (75 mcg total) by mouth daily before breakfast. (Patient taking differently: Take 75 mcg by mouth at bedtime.), Disp: 90 tablet, Rfl: 0   Magnesium Oxide 500 MG TABS, Take 1 tablet by mouth once daily, Disp: 90 tablet, Rfl: 0   metFORMIN (GLUCOPHAGE-XR) 750 MG 24 hr tablet, Take 1 tablet (750 mg total) by mouth daily with breakfast., Disp: 90 tablet, Rfl: 1   pregabalin (LYRICA) 225 MG capsule, Take 1 capsule (225 mg total) by mouth 2 (two) times daily., Disp: 180 capsule, Rfl: 1   QUEtiapine (SEROQUEL) 25 MG tablet, Take 1  tablet (25 mg total) by mouth at bedtime., Disp: 90 tablet, Rfl: 1   rosuvastatin (CRESTOR) 5 MG tablet, Take 1 tablet (5 mg total) by mouth daily., Disp: 90 tablet, Rfl: 1   sildenafil (VIAGRA) 100 MG tablet, TAKE 1 TABLET(100 MG) BY MOUTH DAILY AS NEEDED FOR ERECTILE DYSFUNCTION, Disp: 30 tablet, Rfl: 0   testosterone cypionate (DEPOTESTOSTERONE CYPIONATE) 200 MG/ML injection, ADMINISTER 1 ML IN THE MUSCLE EVERY 14 DAYS, Disp: 10 mL, Rfl: 0   tirzepatide (MOUNJARO) 10 MG/0.5ML Pen, Inject 10 mg into the skin once a week., Disp: 6 mL, Rfl: 0   traZODone (DESYREL) 100 MG tablet, TAKE 1 & 1/2 (ONE & ONE-HALF) TABLETS BY MOUTH AT BEDTIME, Disp: 135 tablet, Rfl: 1  Allergies  Allergen Reactions   Morphine Itching    I personally reviewed active problem list, medication list, allergies, family history, social history, health maintenance with the patient/caregiver today.   ROS  ***  Objective  There were no vitals filed for this visit.  There is no height or weight on file to calculate BMI.  Physical Exam ***   PHQ2/9:    07/29/2023    8:13 AM 07/24/2023    2:45 PM 03/12/2023    8:35 AM 01/09/2023    9:36 AM 12/12/2022    7:49 AM  Depression screen PHQ 2/9  Decreased Interest 0 0 0 0 0  Down, Depressed, Hopeless 0 0 0 0 0  PHQ - 2 Score 0 0 0 0 0  Altered sleeping 0  0 3 0  Tired, decreased energy 0  0 0 0  Change in appetite 0  0 0 0  Feeling bad or failure about yourself  0  0 0 0  Trouble concentrating 0  0 0 0  Moving slowly or fidgety/restless 0  0 0 0  Suicidal thoughts 0  0 0 0  PHQ-9 Score 0  0 3 0    phq 9 is {gen pos [id number]}   Fall Risk:    07/29/2023    8:13 AM 07/24/2023    2:39 PM 03/12/2023    8:35 AM 01/09/2023    9:42 AM 12/12/2022    7:49 AM  Fall Risk   Falls in the past year? 1 1 1 1 1   Number falls in past yr: 1 1 1  0 0  Injury with Fall? 1 1 1 1 1   Comment  head fall but refused to go to ER;torn shoulder ligaments     Risk for fall due to :  History of fall(s);Orthopedic patient No Fall Risks No Fall Risks No Fall Risks No Fall Risks  Follow up Falls prevention discussed;Education provided;Falls evaluation completed Education provided;Falls prevention discussed Falls prevention discussed Falls prevention discussed Falls prevention discussed  Functional Status Survey:      Assessment & Plan  *** There are no diagnoses linked to this encounter.

## 2023-09-03 ENCOUNTER — Other Ambulatory Visit: Payer: Self-pay

## 2023-09-03 DIAGNOSIS — E291 Testicular hypofunction: Secondary | ICD-10-CM

## 2023-09-04 ENCOUNTER — Other Ambulatory Visit: Payer: Medicare Other

## 2023-09-04 NOTE — Progress Notes (Addendum)
Name: [name]   MRN: [id number]    DOB: [birth year]   Date:09/05/2023       Progress Note  Subjective  Chief Complaint  Surgical Clearance  HPI  Pre op clearance: left shoulder surgery  Surgeon is Dr. Dion Saucier  Shoulder pain: constant dull most of the time but can be intense and sharp even without  movement. Ready to have surgery   Hypothyroidism: he is due for TSH , last level was still elevated, taking one pill daily and two on  Sundays . No change in bowel movements, dry skin or dysphagia.   DM: well controlled on Metformin and  Mounjaro, advised to hold Metformin 24 hours prior and Mounjaro one week prior to procedure. A1C was 6.1 % one month ago   He had previous surgeries without complications, no anesthesia problems, no false teeth or partial plates. He is able to perform 8 METS of activities without chest pain . Only has SOB with moderate activity. Normal EF of 50-55 %    Patient Active Problem List   Diagnosis Date Noted   Syncope and collapse 06/17/2023   AKI (acute kidney injury) (HCC) 06/17/2023   Chronic pain 06/17/2023   Hypotension 06/17/2023   Polypharmacy 06/17/2023   Aspiration pneumonia (HCC) 01/04/2023   Acute respiratory failure with hypoxia (HCC) 01/04/2023   Anxiety and depression 01/04/2023   Type 2 diabetes mellitus with peripheral neuropathy (HCC) 01/04/2023   Essential hypertension 01/04/2023   Pain in joint of left shoulder 11/29/2022   Anemia, unspecified 06/12/2016   B12 deficiency 06/11/2016   Allergic conjunctivitis 06/11/2016   Muscle cramps 06/11/2016   Hypertension, benign 11/03/2015   Dyslipidemia 11/02/2015   Acquired nystagmus 08/01/2015   Chronic bilateral low back pain with bilateral sciatica 06/29/2015   Carpal tunnel syndrome 06/29/2015   Osteoarthritis 06/29/2015   Claustrophobia 06/29/2015   Foot drop, left 06/29/2015   Hiatal hernia 06/29/2015   H/O diabetes mellitus 06/29/2015   H/O testicular cancer 06/29/2015    Acquired hypothyroidism 06/29/2015   Dysmetabolic syndrome 06/29/2015   Obesity, Class III, BMI 40-49.9 (morbid obesity) (HCC) 06/29/2015   OSA on CPAP 06/29/2015   Allergic rhinitis 06/29/2015   Neuropathy 06/29/2015   Seborrheic keratoses 06/29/2015   Vitamin D deficiency 06/29/2015   Bariatric surgery status 02/20/2012    Past Surgical History:  Procedure Laterality Date   BACK SURGERY     X 2   HERNIA REPAIR     left side and umbilical hernia repair   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   SHOULDER ARTHROSCOPY W/ ROTATOR CUFF REPAIR Left 01/02/2023   URETEROTOMY Left 12/23/1998    Family History  Problem Relation Age of Onset   Coronary artery disease Mother    Cancer Father        Melanoma and Testicular   Diabetes Father     Social History   Tobacco Use   Smoking status: Former    Current packs/day: 0.00    Average packs/day: 3.0 packs/day for 8.0 years (24.0 ttl pk-yrs)    Types: Cigarettes, Cigars, Pipe    Start date: 06/28/1984    Quit date: 12/24/1991    Years since quitting: 31.7   Smokeless tobacco: Former    Types: Chew    Quit date: 12/24/1991  Substance Use Topics   Alcohol use: No    Alcohol/week: 0.0 standard drinks of alcohol     Current Outpatient Medications:    albuterol (VENTOLIN HFA) 108 (90 Base) MCG/ACT inhaler, Inhale  2 puffs into the lungs every 6 (six) hours as needed for wheezing or shortness of breath., Disp: 8 g, Rfl: 2   ALLERGY RELIEF 180 MG tablet, TAKE 1 TABLET(180 MG) BY MOUTH DAILY, Disp: 90 tablet, Rfl: 0   allopurinol (ZYLOPRIM) 100 MG tablet, Take 1 tablet (100 mg total) by mouth 2 (two) times daily., Disp: 180 tablet, Rfl: 1   busPIRone (BUSPAR) 5 MG tablet, TAKE 1 TO 2 TABLETS BY MOUTH THREE TIMES DAILY, Disp: 180 tablet, Rfl: 1   cyclobenzaprine (FLEXERIL) 10 MG tablet, Take 1 tablet (10 mg total) by mouth at bedtime. TAKE 1 TABLET BY MOUTH AT BEDTIME, Disp: 90 tablet, Rfl: 1   DULoxetine (CYMBALTA) 60 MG capsule, Take 1  capsule (60 mg total) by mouth daily., Disp: 90 capsule, Rfl: 1   fluticasone (FLONASE) 50 MCG/ACT nasal spray, Place 2 sprays into both nostrils daily., Disp: 16 g, Rfl: 6   levothyroxine (SYNTHROID) 75 MCG tablet, Take 1 tablet (75 mcg total) by mouth daily before breakfast. (Patient taking differently: Take 75 mcg by mouth at bedtime.), Disp: 90 tablet, Rfl: 0   Magnesium Oxide 500 MG TABS, Take 1 tablet by mouth once daily, Disp: 90 tablet, Rfl: 0   metFORMIN (GLUCOPHAGE-XR) 750 MG 24 hr tablet, Take 1 tablet (750 mg total) by mouth daily with breakfast., Disp: 90 tablet, Rfl: 1   pregabalin (LYRICA) 225 MG capsule, Take 1 capsule (225 mg total) by mouth 2 (two) times daily., Disp: 180 capsule, Rfl: 1   QUEtiapine (SEROQUEL) 25 MG tablet, Take 1 tablet (25 mg total) by mouth at bedtime., Disp: 90 tablet, Rfl: 1   rosuvastatin (CRESTOR) 5 MG tablet, Take 1 tablet (5 mg total) by mouth daily., Disp: 90 tablet, Rfl: 1   sildenafil (VIAGRA) 100 MG tablet, TAKE 1 TABLET(100 MG) BY MOUTH DAILY AS NEEDED FOR ERECTILE DYSFUNCTION, Disp: 30 tablet, Rfl: 0   testosterone cypionate (DEPOTESTOSTERONE CYPIONATE) 200 MG/ML injection, ADMINISTER 1 ML IN THE MUSCLE EVERY 14 DAYS, Disp: 10 mL, Rfl: 0   tirzepatide (MOUNJARO) 10 MG/0.5ML Pen, Inject 10 mg into the skin once a week., Disp: 6 mL, Rfl: 0   traZODone (DESYREL) 100 MG tablet, TAKE 1 & 1/2 (ONE & ONE-HALF) TABLETS BY MOUTH AT BEDTIME, Disp: 135 tablet, Rfl: 1  Allergies  Allergen Reactions   Morphine Itching    I personally reviewed active problem list, medication list, allergies, family history, social history, health maintenance with the patient/caregiver today.   ROS  Ten systems reviewed and is negative except as mentioned in HPI    Objective  Vitals:   09/05/23 0820  BP: 120/76  Pulse: 89  Resp: 16  SpO2: 95%  Weight: 274 lb (124.3 kg)  Height: 5\' 11"  (1.803 m)    Body mass index is 38.22 kg/m.  Physical  Exam  Constitutional: Patient appears well-developed and well-nourished. Obese  No distress.  HEENT: head atraumatic, normocephalic, pupils equal and reactive to light,, neck supple Cardiovascular: Normal rate, regular rhythm and normal heart sounds.  No murmur heard. No BLE edema. Pulmonary/Chest: Effort normal and breath sounds normal. No respiratory distress. Abdominal: Soft.  There is no tenderness. Psychiatric: Patient has a normal mood and affect. behavior is normal. Judgment and thought content normal.  Muscular skeletal: pain with less than 30 degree of abduction of left shoulder    PHQ2/9:    09/05/2023    8:20 AM 07/29/2023    8:13 AM 07/24/2023    2:45 PM 03/12/2023  8:35 AM 01/09/2023    9:36 AM  Depression screen PHQ 2/9  Decreased Interest 0 0 0 0 0  Down, Depressed, Hopeless 0 0 0 0 0  PHQ - 2 Score 0 0 0 0 0  Altered sleeping 0 0  0 3  Tired, decreased energy 0 0  0 0  Change in appetite 0 0  0 0  Feeling bad or failure about yourself  0 0  0 0  Trouble concentrating 0 0  0 0  Moving slowly or fidgety/restless 0 0  0 0  Suicidal thoughts 0 0  0 0  PHQ-9 Score 0 0  0 3    phq 9 is negative   Fall Risk:    09/05/2023    8:20 AM 07/29/2023    8:13 AM 07/24/2023    2:39 PM 03/12/2023    8:35 AM 01/09/2023    9:42 AM  Fall Risk   Falls in the past year? 1 1 1 1 1   Number falls in past yr: 1 1 1 1  0  Injury with Fall? 1 1 1 1 1   Comment   head fall but refused to go to ER;torn shoulder ligaments    Risk for fall due to : History of fall(s) History of fall(s);Orthopedic patient No Fall Risks No Fall Risks No Fall Risks  Follow up Falls prevention discussed Falls prevention discussed;Education provided;Falls evaluation completed Education provided;Falls prevention discussed Falls prevention discussed Falls prevention discussed      Functional Status Survey: Is the patient deaf or have difficulty hearing?: No Does the patient have difficulty seeing, even when  wearing glasses/contacts?: Yes Does the patient have difficulty concentrating, remembering, or making decisions?: No Does the patient have difficulty walking or climbing stairs?: Yes Does the patient have difficulty dressing or bathing?: No Does the patient have difficulty doing errands alone such as visiting a doctor's office or shopping?: No    Assessment & Plan  1. Shoulder impingement syndrome, left  Having surgery   2. Dyslipidemia associated with type 2 diabetes mellitus (HCC)  A1C is at goal   3. B12 deficiency  - cyanocobalamin (VITAMIN B12) injection 1,000 mcg  4. Acquired hypothyroidism  - TSH  5. Pre-op exam  May proceed without further test    6. Needs flu shot  - Flu vaccine trivalent PF, 6mos and older(Flulaval,Afluria,Fluarix,Fluzone)

## 2023-09-05 ENCOUNTER — Encounter: Payer: Self-pay | Admitting: Family Medicine

## 2023-09-05 ENCOUNTER — Ambulatory Visit (INDEPENDENT_AMBULATORY_CARE_PROVIDER_SITE_OTHER): Payer: Medicare Other | Admitting: Family Medicine

## 2023-09-05 VITALS — BP 120/76 | HR 89 | Resp 16 | Ht 71.0 in | Wt 274.0 lb

## 2023-09-05 DIAGNOSIS — E785 Hyperlipidemia, unspecified: Secondary | ICD-10-CM | POA: Diagnosis not present

## 2023-09-05 DIAGNOSIS — Z01818 Encounter for other preprocedural examination: Secondary | ICD-10-CM | POA: Diagnosis not present

## 2023-09-05 DIAGNOSIS — Z7984 Long term (current) use of oral hypoglycemic drugs: Secondary | ICD-10-CM | POA: Diagnosis not present

## 2023-09-05 DIAGNOSIS — Z23 Encounter for immunization: Secondary | ICD-10-CM | POA: Diagnosis not present

## 2023-09-05 DIAGNOSIS — E039 Hypothyroidism, unspecified: Secondary | ICD-10-CM

## 2023-09-05 DIAGNOSIS — E1169 Type 2 diabetes mellitus with other specified complication: Secondary | ICD-10-CM

## 2023-09-05 DIAGNOSIS — E538 Deficiency of other specified B group vitamins: Secondary | ICD-10-CM | POA: Diagnosis not present

## 2023-09-05 DIAGNOSIS — M7542 Impingement syndrome of left shoulder: Secondary | ICD-10-CM

## 2023-09-05 MED ORDER — CYANOCOBALAMIN 1000 MCG/ML IJ SOLN
1000.0000 ug | Freq: Once | INTRAMUSCULAR | Status: AC
Start: 2023-09-05 — End: 2023-09-05
  Administered 2023-09-05: 1000 ug via INTRAMUSCULAR

## 2023-09-05 NOTE — Addendum Note (Signed)
Addended by: Alba Cory F on: 09/05/2023 09:05 AM   Modules accepted: Orders

## 2023-09-06 LAB — TSH: TSH: 6.89 m[IU]/L — ABNORMAL HIGH (ref 0.40–4.50)

## 2023-09-08 ENCOUNTER — Other Ambulatory Visit: Payer: Self-pay | Admitting: Family Medicine

## 2023-09-08 DIAGNOSIS — E039 Hypothyroidism, unspecified: Secondary | ICD-10-CM

## 2023-09-08 MED ORDER — LEVOTHYROXINE SODIUM 88 MCG PO TABS
88.0000 ug | ORAL_TABLET | Freq: Every day | ORAL | 0 refills | Status: DC
Start: 2023-09-08 — End: 2023-11-21

## 2023-09-09 NOTE — Progress Notes (Deleted)
09/10/2023 1:09 PM   [name] 11/08/1971 [id number]  Referring provider: Alba Cory, MD [address] [place],  [place] [address]  Urological history: 1. Testosterone deficiency -contributing factors of age, diabetes, sleep apnea, chronic opioid therapy and testicular cancer -testosterone level pending -hemoglobin/hematocrit (07/2023) 15.5/47.5 -testosterone cypionate 200 mg/mL, 1 cc every 14 days   2. ED -contributing factors of age, BPH, testosterone deficiency, HLD, hypothyroidism, obesity, sleep apnea, chronic opioid therapy, anxiety, depression and smoking  3. BPH with LU TS -PSA pending  HPI: [name] is a 52 y.o. male who presents today for a six month follow up.  Previous records reviewed.   Hospitalized over the summer for syncopal episodes due to polypharmacy.   SHIM ***      Score: 1-7 Severe ED 8-11 Moderate ED 12-16 Mild-Moderate ED 17-21 Mild ED 22-25 No ED   I PSS ***    Score:  1-7 Mild 8-19 Moderate 20-35 Severe   PMH: Past Medical History:  Diagnosis Date   Allergy    Diabetes (HCC)    Gout    Hiatal hernia    Hyperlipidemia    Hypertension    Hypothyroidism    Metabolic syndrome    Obesity    OSA on CPAP    11 mmhg CPAP   Radiculopathy    Seborrhea    Testicular cancer (HCC)    Testicular hypofunction    Urine ketone    Vitamin D deficiency     Surgical History: Past Surgical History:  Procedure Laterality Date   BACK SURGERY     X 2   HERNIA REPAIR     left side and umbilical hernia repair   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   SHOULDER ARTHROSCOPY W/ ROTATOR CUFF REPAIR Left 01/02/2023   URETEROTOMY Left 12/23/1998    Home Medications:  Allergies as of 09/10/2023       Reactions   Morphine Itching        Medication List        Accurate as of September 09, 2023  1:09 PM. If you have any questions, ask your nurse or doctor.          albuterol 108 (90 Base)  MCG/ACT inhaler Commonly known as: VENTOLIN HFA Inhale 2 puffs into the lungs every 6 (six) hours as needed for wheezing or shortness of breath.   Allergy Relief 180 MG tablet Generic drug: fexofenadine TAKE 1 TABLET(180 MG) BY MOUTH DAILY   allopurinol 100 MG tablet Commonly known as: ZYLOPRIM Take 1 tablet (100 mg total) by mouth 2 (two) times daily.   busPIRone 5 MG tablet Commonly known as: BUSPAR TAKE 1 TO 2 TABLETS BY MOUTH THREE TIMES DAILY   cyclobenzaprine 10 MG tablet Commonly known as: FLEXERIL Take 1 tablet (10 mg total) by mouth at bedtime. TAKE 1 TABLET BY MOUTH AT BEDTIME   DULoxetine 60 MG capsule Commonly known as: CYMBALTA Take 1 capsule (60 mg total) by mouth daily.   fluticasone 50 MCG/ACT nasal spray Commonly known as: FLONASE Place 2 sprays into both nostrils daily.   levothyroxine 88 MCG tablet Commonly known as: SYNTHROID Take 1 tablet (88 mcg total) by mouth daily before breakfast.   Magnesium Oxide -Mg Supplement 500 MG Tabs Take 1 tablet by mouth once daily   metFORMIN 750 MG 24 hr tablet Commonly known as: GLUCOPHAGE-XR Take 1 tablet (750 mg total) by mouth daily with breakfast.   pregabalin 225 MG capsule Commonly  known as: LYRICA Take 1 capsule (225 mg total) by mouth 2 (two) times daily.   QUEtiapine 25 MG tablet Commonly known as: SEROQUEL Take 1 tablet (25 mg total) by mouth at bedtime.   rosuvastatin 5 MG tablet Commonly known as: CRESTOR Take 1 tablet (5 mg total) by mouth daily.   sildenafil 100 MG tablet Commonly known as: VIAGRA TAKE 1 TABLET(100 MG) BY MOUTH DAILY AS NEEDED FOR ERECTILE DYSFUNCTION   testosterone cypionate 200 MG/ML injection Commonly known as: DEPOTESTOSTERONE CYPIONATE ADMINISTER 1 ML IN THE MUSCLE EVERY 14 DAYS   tirzepatide 10 MG/0.5ML Pen Commonly known as: MOUNJARO Inject 10 mg into the skin once a week.   traZODone 100 MG tablet Commonly known as: DESYREL TAKE 1 & 1/2 (ONE & ONE-HALF)  TABLETS BY MOUTH AT BEDTIME        Allergies:  Allergies  Allergen Reactions   Morphine Itching    Family History: Family History  Problem Relation Age of Onset   Coronary artery disease Mother    Cancer Father        Melanoma and Testicular   Diabetes Father     Social History:  reports that he quit smoking about 31 years ago. His smoking use included cigarettes, cigars, and pipe. He started smoking about 39 years ago. He has a 24 pack-year smoking history. He quit smokeless tobacco use about 31 years ago.  His smokeless tobacco use included chew. He reports that he does not drink alcohol and does not use drugs.  ROS: Pertinent ROS in HPI  Physical Exam: There were no vitals taken for this visit.  Constitutional:  Well nourished. Alert and oriented, No acute distress. HEENT: [place] AT, moist mucus membranes.  Trachea midline, no masses. Cardiovascular: No clubbing, cyanosis, or edema. Respiratory: Normal respiratory effort, no increased work of breathing. GI: Abdomen is soft, non tender, non distended, no abdominal masses. Liver and spleen not palpable.  No hernias appreciated.  Stool sample for occult testing is not indicated.   GU: No CVA tenderness.  No bladder fullness or masses.  Patient with circumcised/uncircumcised phallus. ***Foreskin easily retracted***  Urethral meatus is patent.  No penile discharge. No penile lesions or rashes. Scrotum without lesions, cysts, rashes and/or edema.  Testicles are located scrotally bilaterally. No masses are appreciated in the testicles. Left and right epididymis are normal. Rectal: Patient with  normal sphincter tone. Anus and perineum without scarring or rashes. No rectal masses are appreciated. Prostate is approximately *** grams, *** nodules are appreciated. Seminal vesicles are normal. Skin: No rashes, bruises or suspicious lesions. Lymph: No cervical or inguinal adenopathy. Neurologic: Grossly intact, no focal deficits, moving all 4  extremities. Psychiatric: Normal mood and affect.   Laboratory Data: Results for orders placed or performed in visit on 09/05/23  TSH  Result Value Ref Range   TSH 6.89 (H) 0.40 - 4.50 mIU/L    CBC    Component Value Date/Time   WBC 6.4 07/29/2023 0847   RBC 5.28 07/29/2023 0847   HGB 15.5 07/29/2023 0847   HGB 14.3 03/11/2023 1132   HCT 47.5 07/29/2023 0847   HCT 45.9 03/11/2023 1132   PLT 209 07/29/2023 0847   PLT 174 01/11/2015 0342   MCV 90.0 07/29/2023 0847   MCV 88 01/11/2015 0342   MCH 29.4 07/29/2023 0847   MCHC 32.6 07/29/2023 0847   RDW 12.5 07/29/2023 0847   RDW 13.3 01/11/2015 0342   LYMPHSABS 1,165 07/29/2023 0847   LYMPHSABS 1.3 01/11/2015  0342   MONOABS 0.7 01/04/2023 1714   MONOABS 0.7 01/11/2015 0342   EOSABS 237 07/29/2023 0847   EOSABS 0.3 01/11/2015 0342   BASOSABS 51 07/29/2023 0847   BASOSABS 0.1 01/11/2015 0342    CMP     Component Value Date/Time   NA 140 07/29/2023 0847   NA 144 01/11/2015 0342   K 5.0 07/29/2023 0847   K 4.2 01/11/2015 0342   CL 105 07/29/2023 0847   CL 107 01/11/2015 0342   CO2 29 07/29/2023 0847   CO2 33 (H) 01/11/2015 0342   GLUCOSE 120 (H) 07/29/2023 0847   GLUCOSE 93 01/11/2015 0342   BUN 21 07/29/2023 0847   BUN 15 01/11/2015 0342   CREATININE 1.28 07/29/2023 0847   CALCIUM 9.4 07/29/2023 0847   CALCIUM 8.2 (L) 01/11/2015 0342   PROT 6.4 07/29/2023 0847   ALBUMIN 2.8 (L) 06/19/2023 0326   AST 16 07/29/2023 0847   ALT 15 07/29/2023 0847   ALKPHOS 84 06/19/2023 0326   BILITOT 0.8 07/29/2023 0847   EGFR 68 07/29/2023 0847   GFRNONAA >60 06/19/2023 0326   GFRNONAA 65 03/28/2021 0855    Component     Latest Ref Rng 07/29/2023  Hemoglobin A1C     4.0 - 5.6 % 6.1 !   HbA1c, POC (controlled diabetic range)     0.0 - 7.0 %     Legend: ! Abnormal I have reviewed the labs.  See HPI.    Pertinent Imaging: N/A  Assessment & Plan:    1. Testosterone deficiency  -testosterone levels  pending -Hemoglobin/hematocrit normal -continue testosterone cypionate 200 mg/mL, 1 cc every 14 days   2. BPH with LUTS -PSA pending  3. Erectile dysfunction:    - ***   No follow-ups on file.  These notes generated with voice recognition software. I apologize for typographical errors.  Cloretta Ned  [facility] [address]  [address] [place], [place] [address] [phone]

## 2023-09-10 ENCOUNTER — Encounter: Payer: Self-pay | Admitting: Urology

## 2023-09-10 ENCOUNTER — Ambulatory Visit: Payer: Medicare Other | Admitting: Urology

## 2023-09-10 DIAGNOSIS — N138 Other obstructive and reflux uropathy: Secondary | ICD-10-CM

## 2023-09-10 DIAGNOSIS — N529 Male erectile dysfunction, unspecified: Secondary | ICD-10-CM

## 2023-09-10 DIAGNOSIS — E291 Testicular hypofunction: Secondary | ICD-10-CM

## 2023-09-18 DIAGNOSIS — M24012 Loose body in left shoulder: Secondary | ICD-10-CM | POA: Diagnosis not present

## 2023-09-18 DIAGNOSIS — G8918 Other acute postprocedural pain: Secondary | ICD-10-CM | POA: Diagnosis not present

## 2023-09-18 DIAGNOSIS — T84498A Other mechanical complication of other internal orthopedic devices, implants and grafts, initial encounter: Secondary | ICD-10-CM | POA: Diagnosis not present

## 2023-09-18 DIAGNOSIS — M7522 Bicipital tendinitis, left shoulder: Secondary | ICD-10-CM | POA: Diagnosis not present

## 2023-09-18 DIAGNOSIS — S46012A Strain of muscle(s) and tendon(s) of the rotator cuff of left shoulder, initial encounter: Secondary | ICD-10-CM | POA: Diagnosis not present

## 2023-09-22 ENCOUNTER — Emergency Department
Admission: EM | Admit: 2023-09-22 | Discharge: 2023-09-22 | Disposition: A | Discharge status: Home / Self Care | Payer: Medicare Other | Attending: Emergency Medicine | Admitting: Emergency Medicine

## 2023-09-22 ENCOUNTER — Emergency Department: Payer: Medicare Other

## 2023-09-22 ENCOUNTER — Other Ambulatory Visit: Payer: Self-pay

## 2023-09-22 DIAGNOSIS — R079 Chest pain, unspecified: Secondary | ICD-10-CM | POA: Insufficient documentation

## 2023-09-22 DIAGNOSIS — R0789 Other chest pain: Secondary | ICD-10-CM | POA: Diagnosis not present

## 2023-09-22 DIAGNOSIS — R918 Other nonspecific abnormal finding of lung field: Secondary | ICD-10-CM | POA: Diagnosis not present

## 2023-09-22 DIAGNOSIS — R0600 Dyspnea, unspecified: Secondary | ICD-10-CM | POA: Insufficient documentation

## 2023-09-22 DIAGNOSIS — E119 Type 2 diabetes mellitus without complications: Secondary | ICD-10-CM | POA: Insufficient documentation

## 2023-09-22 DIAGNOSIS — R0602 Shortness of breath: Secondary | ICD-10-CM | POA: Diagnosis not present

## 2023-09-22 DIAGNOSIS — I10 Essential (primary) hypertension: Secondary | ICD-10-CM | POA: Diagnosis not present

## 2023-09-22 LAB — CBC
HCT: 42.4 % (ref 39.0–52.0)
Hemoglobin: 13.8 g/dL (ref 13.0–17.0)
MCH: 29.4 pg (ref 26.0–34.0)
MCHC: 32.5 g/dL (ref 30.0–36.0)
MCV: 90.2 fL (ref 80.0–100.0)
Platelets: 161 10*3/uL (ref 150–400)
RBC: 4.7 MIL/uL (ref 4.22–5.81)
RDW: 13.9 % (ref 11.5–15.5)
WBC: 5.8 10*3/uL (ref 4.0–10.5)
nRBC: 0 % (ref 0.0–0.2)

## 2023-09-22 LAB — BASIC METABOLIC PANEL
Anion gap: 7 (ref 5–15)
BUN: 19 mg/dL (ref 6–20)
CO2: 28 mmol/L (ref 22–32)
Calcium: 8.6 mg/dL — ABNORMAL LOW (ref 8.9–10.3)
Chloride: 106 mmol/L (ref 98–111)
Creatinine, Ser: 1.13 mg/dL (ref 0.61–1.24)
GFR, Estimated: >60 mL/min (ref >60)
Glucose, Bld: 186 mg/dL — ABNORMAL HIGH (ref 70–99)
Potassium: 4.2 mmol/L (ref 3.5–5.1)
Sodium: 141 mmol/L (ref 135–145)

## 2023-09-22 LAB — TROPONIN I (HIGH SENSITIVITY): Troponin I (High Sensitivity): 6 ng/L (ref <18)

## 2023-09-22 MED ORDER — AZITHROMYCIN 250 MG PO TABS: ORAL_TABLET | ORAL | 0 refills | Status: AC

## 2023-09-22 NOTE — ED Triage Notes (Signed)
Pt to ED for shob since Thursday. Had shoulder surgery Thursday, received nerve block with exparel.  RR even and unlabored.

## 2023-09-22 NOTE — ED Notes (Signed)
See triage notes. Patient c/o shortness of breath since Saturday. Patient had rotator cuff surgery on Thursday.

## 2023-09-22 NOTE — Discharge Instructions (Addendum)
Please take antibiotics as prescribed for their entire course.  Return to the emergency department for any worsening shortness of breath any chest pain any fever or any other symptom personally concerning to yourself.  Otherwise please follow-up with your doctor in 2 to 3 days for recheck/reevaluation.

## 2023-09-22 NOTE — ED Provider Notes (Signed)
[facility] Provider Note    Event Date/Time   First MD Initiated Contact with Patient 09/22/23 1151     (approximate)  History   Chief Complaint: Shortness of Breath  HPI  [name] is a 52 y.o. male with a past medical history of diabetes, hypertension, hyperlipidemia, presents to the emergency department for shortness of breath.  According to the patient he had a rotator cuff surgery performed on his left shoulder this past Thursday.  He states starting Saturday he began experiencing discomfort extending into the left chest as well as a sensation of shortness of breath.  Patient denies any pleuritic pain but feels like he cannot take a full deep breath.  Patient was concerned so he came to the emergency department for evaluation.  Reassuring vital signs including 97% room air saturation.  Physical Exam   Triage Vital Signs: ED Triage Vitals  Encounter Vitals Group     BP 09/22/23 1114 (!) 150/96     Systolic BP Percentile --      Diastolic BP Percentile --      Pulse Rate 09/22/23 1119 85     Resp 09/22/23 1114 20     Temp 09/22/23 1119 97.8 F (36.6 C)     Temp src --      SpO2 09/22/23 1119 97 %     Weight 09/22/23 1115 274 lb (124.3 kg)     Height 09/22/23 1115 5\' 9"  (1.753 m)     Head Circumference --      Peak Flow --      Pain Score 09/22/23 1115 5     Pain Loc --      Pain Education --      Exclude from Growth Chart --     Most recent vital signs: Vitals:   09/22/23 1114 09/22/23 1119  BP: (!) 150/96   Pulse:  85  Resp: 20   Temp:  97.8 F (36.6 C)  SpO2:  97%    General: Awake, no distress.  CV:  Good peripheral perfusion.  Regular rate and rhythm  Resp:  Normal effort.  Equal breath sounds bilaterally.  Abd:  No distention.  Soft, nontender.  No rebound or guarding. Other:  Neuro vastly intact left upper extremities well-appearing incision.  2+ radial pulse.   ED Results / Procedures / Treatments   EKG  EKG  viewed and interpreted by myself shows a normal sinus rhythm 85 bpm with a narrow QRS, left axis deviation, largely normal intervals with nonspecific ST changes.  RADIOLOGY  I have reviewed and interpreted the chest x-ray images.  No obvious consolidation on my evaluation.   MEDICATIONS ORDERED IN ED: Medications - No data to display   IMPRESSION / MDM / ASSESSMENT AND PLAN / ED COURSE  I reviewed the triage vital signs and the nursing notes.  Patient's presentation is most consistent with acute presentation with potential threat to life or bodily function.  Patient presents to the emergency department for shortness of breath and left chest discomfort.  Patient did have an interscalene block during the surgery on Thursday.  His left shoulder into his chest pain began on Saturday which does coincide with the amount of time that we would expect the nerve block to start wearing off.  Patient denies any cough or fever.  States mild shortness of breath however this could also be related to the patient's recent surgery given the interscalene nerve block could possibly cause a mild  or partial diaphragmatic paralysis although not overly evident on chest x-ray on my evaluation.  Patient CBC is reassuring, chemistry is reassuring.  I have added on a troponin and we are awaiting radiology read of the chest x-ray.  Overall patient appears very well on my examination.  No clinical concern for pulmonary embolus.  Patient's lab work is reassuring CBC is normal, chemistry is normal, troponin is negative.  They are reading minimal left basilar opacity could be atelectasis or pneumonia.  This would also coincide with the size of the patient's shoulder surgery/interscalene nerve block was performed which could have led to a partial diaphragmatic paralysis which could account for the atelectasis.  However given the patient's history of pneumonia following surgery previously we will cover with Zithromax as a  precaution.  Patient and wife agreeable to plan of care.  FINAL CLINICAL IMPRESSION(S) / ED DIAGNOSES   Dyspnea Chest pain    Note:  This document was prepared using Dragon voice recognition software and may include unintentional dictation errors.   Minna Antis, MD 09/22/23 1420

## 2023-10-01 DIAGNOSIS — S46012D Strain of muscle(s) and tendon(s) of the rotator cuff of left shoulder, subsequent encounter: Secondary | ICD-10-CM | POA: Diagnosis not present

## 2023-10-06 ENCOUNTER — Ambulatory Visit: Payer: Medicare Other

## 2023-10-06 DIAGNOSIS — E538 Deficiency of other specified B group vitamins: Secondary | ICD-10-CM | POA: Diagnosis not present

## 2023-10-06 MED ORDER — CYANOCOBALAMIN 1000 MCG/ML IJ SOLN
1000.0000 ug | Freq: Once | INTRAMUSCULAR | Status: AC
Start: 2023-10-06 — End: 2023-10-06
  Administered 2023-10-06: 1000 ug via INTRAMUSCULAR

## 2023-11-03 ENCOUNTER — Ambulatory Visit: Payer: Medicare Other

## 2023-11-19 ENCOUNTER — Other Ambulatory Visit: Payer: Medicare Other

## 2023-11-19 NOTE — Progress Notes (Signed)
Name: [name]   MRN: [id number]    DOB: 1971/07/01   Date:11/26/2023       Progress Note  Subjective  Chief Complaint  Chief Complaint  Patient presents with   Medical Management of Chronic Issues    HPI  Discussed the use of AI scribe software for clinical note transcription with the patient, who gave verbal consent to proceed.  History of Present Illness   The patient, with a history of hypothyroidism, diabetes, and recent left shoulder rotator cuff surgery, presented for a follow-up visit. He reported no issues with his blood pressure at home. However, he had a recent hospital visit due to shortness of breath and left chest discomfort, which occurred a few days post-surgery. The patient was unable to take full breaths. He underwent a chest x-ray and EKG, revealing minimal left lower opacity, which was treated as either atelectasis or pneumonia with a Z-Pak. The patient reported resolution of symptoms post-treatment.  The patient's hypothyroidism has been a concern due to fluctuating levels despite consistent medication dosage. He reported no symptoms such as hair loss, dry skin, problems swallowing, or changes in bowel movement. The patient has been adhering to the adjusted dose of levothyroxine and is due for a thyroid level check.  His diabetes, previously controlled post-bariatric surgery in 2013, showed a spike in A1c levels, necessitating the reintroduction of metformin and a switch from Rybelsus to [facility]. The patient reported no symptoms of excessive hunger, thirst, or frequent urination. He also reported stable neuropathy symptoms on his feet, managed with Lyrica.  The patient also reported chronic low back pain, managed with Lyrica, Flexeril, and occasional Tylenol. He has a history of depression and anxiety, managed with Seroquel, trazodone, and duloxetine. The patient reported a stable mood and denied feeling down, depressed, or hopeless. He also has a history of  obstructive sleep apnea, which was managed with a CPAP machine but discontinued post-bariatric surgery. The patient reported no current issues with snoring.  The patient's left shoulder post-rotator cuff surgery was reported as still sore but significantly improved. He has not yet started physical therapy. He also reported a stable condition of his gout, managed with allopurinol. The patient has been on disability for over ten years due to left foot drop, back pain, and neuropathy. He reported no change in the condition of his left foot drop.         Patient Active Problem List   Diagnosis Date Noted   Syncope and collapse 06/17/2023   Chronic pain 06/17/2023   Hypotension 06/17/2023   Polypharmacy 06/17/2023   Anxiety and depression 01/04/2023   Type 2 diabetes mellitus with peripheral neuropathy (HCC) 01/04/2023   Essential hypertension 01/04/2023   Pain in joint of left shoulder 11/29/2022   Anemia, unspecified 06/12/2016   B12 deficiency 06/11/2016   Allergic conjunctivitis 06/11/2016   Muscle cramps 06/11/2016   Hypertension, benign 11/03/2015   Dyslipidemia 11/02/2015   Acquired nystagmus 08/01/2015   Chronic bilateral low back pain with bilateral sciatica 06/29/2015   Carpal tunnel syndrome 06/29/2015   Osteoarthritis 06/29/2015   Claustrophobia 06/29/2015   Foot drop, left 06/29/2015   Hiatal hernia 06/29/2015   H/O diabetes mellitus 06/29/2015   H/O testicular cancer 06/29/2015   Acquired hypothyroidism 06/29/2015   Dysmetabolic syndrome 06/29/2015   Obesity, Class III, BMI 40-49.9 (morbid obesity) (HCC) 06/29/2015   OSA on CPAP 06/29/2015   Allergic rhinitis 06/29/2015   Neuropathy 06/29/2015   Seborrheic keratoses 06/29/2015   Vitamin D  deficiency 06/29/2015   Bariatric surgery status 02/20/2012    Past Surgical History:  Procedure Laterality Date   BACK SURGERY     X 2   HERNIA REPAIR     left side and umbilical hernia repair   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   SHOULDER ARTHROSCOPY W/ ROTATOR CUFF REPAIR Left 01/02/2023   URETEROTOMY Left 12/23/1998    Family History  Problem Relation Age of Onset   Coronary artery disease Mother    Cancer Father        Melanoma and Testicular   Diabetes Father     Social History   Tobacco Use   Smoking status: Former    Current packs/day: 0.00    Average packs/day: 3.0 packs/day for 8.0 years (24.0 ttl pk-yrs)    Types: Cigarettes, Cigars, Pipe    Start date: 06/28/1984    Quit date: 12/24/1991    Years since quitting: 31.9   Smokeless tobacco: Former    Types: Chew    Quit date: 12/24/1991  Substance Use Topics   Alcohol use: No    Alcohol/week: 0.0 standard drinks of alcohol     Current Outpatient Medications:    albuterol (VENTOLIN HFA) 108 (90 Base) MCG/ACT inhaler, Inhale 2 puffs into the lungs every 6 (six) hours as needed for wheezing or shortness of breath., Disp: 8 g, Rfl: 2   ALLERGY RELIEF 180 MG tablet, TAKE 1 TABLET(180 MG) BY MOUTH DAILY, Disp: 90 tablet, Rfl: 0   allopurinol (ZYLOPRIM) 100 MG tablet, Take 1 tablet (100 mg total) by mouth 2 (two) times daily., Disp: 180 tablet, Rfl: 1   busPIRone (BUSPAR) 5 MG tablet, TAKE 1 TO 2 TABLETS BY MOUTH THREE TIMES DAILY, Disp: 180 tablet, Rfl: 1   cyclobenzaprine (FLEXERIL) 10 MG tablet, Take 1 tablet (10 mg total) by mouth at bedtime. TAKE 1 TABLET BY MOUTH AT BEDTIME, Disp: 90 tablet, Rfl: 1   DULoxetine (CYMBALTA) 60 MG capsule, Take 1 capsule (60 mg total) by mouth daily., Disp: 90 capsule, Rfl: 1   fluticasone (FLONASE) 50 MCG/ACT nasal spray, Place 2 sprays into both nostrils daily., Disp: 16 g, Rfl: 6   levothyroxine (SYNTHROID) 88 MCG tablet, Take 1 tablet (88 mcg total) by mouth daily before breakfast., Disp: 30 tablet, Rfl: 0   Magnesium Oxide 500 MG TABS, Take 1 tablet by mouth once daily, Disp: 90 tablet, Rfl: 0   metFORMIN (GLUCOPHAGE-XR) 750 MG 24 hr tablet, Take 1 tablet (750 mg total) by mouth daily with breakfast.,  Disp: 90 tablet, Rfl: 1   pregabalin (LYRICA) 225 MG capsule, Take 1 capsule (225 mg total) by mouth 2 (two) times daily., Disp: 180 capsule, Rfl: 1   QUEtiapine (SEROQUEL) 25 MG tablet, Take 1 tablet (25 mg total) by mouth at bedtime., Disp: 90 tablet, Rfl: 1   rosuvastatin (CRESTOR) 5 MG tablet, Take 1 tablet (5 mg total) by mouth daily., Disp: 90 tablet, Rfl: 1   sildenafil (VIAGRA) 100 MG tablet, TAKE 1 TABLET(100 MG) BY MOUTH DAILY AS NEEDED FOR ERECTILE DYSFUNCTION, Disp: 30 tablet, Rfl: 0   testosterone cypionate (DEPOTESTOSTERONE CYPIONATE) 200 MG/ML injection, ADMINISTER 1 ML IN THE MUSCLE EVERY 14 DAYS, Disp: 10 mL, Rfl: 0   tirzepatide (MOUNJARO) 10 MG/0.5ML Pen, Inject 10 mg into the skin once a week., Disp: 6 mL, Rfl: 0   traZODone (DESYREL) 100 MG tablet, TAKE 1 & 1/2 (ONE & ONE-HALF) TABLETS BY MOUTH AT BEDTIME, Disp: 135 tablet, Rfl: 1  Allergies  Allergen Reactions   Morphine Itching    I personally reviewed active problem list, medication list, allergies, family history with the patient/caregiver today.   ROS  Ten systems reviewed and is negative except as mentioned in HPI    Objective  Vitals:   11/26/23 0907 11/26/23 0927  BP: (!) 122/90 122/86  Pulse: 92   Resp: 16   Temp: 97.8 F (36.6 C)   TempSrc: Oral   SpO2: 96%   Weight: 276 lb 6.4 oz (125.4 kg)   Height: 5\' 11"  (1.803 m)     Body mass index is 38.55 kg/m.  Physical Exam  Constitutional: Patient appears well-developed and well-nourished. Obese  No distress.  HEENT: head atraumatic, normocephalic, pupils equal and reactive to light, neck supple, throat within normal limits Cardiovascular: Normal rate, regular rhythm and normal heart sounds.  No murmur heard. No BLE edema. Pulmonary/Chest: Effort normal and breath sounds normal. No respiratory distress. Abdominal: Soft.  There is no tenderness. Psychiatric: Patient has a normal mood and affect. behavior is normal. Judgment and thought content  normal.   Recent Results (from the past 2160 hour(s))  TSH     Status: Abnormal   Collection Time: 09/05/23  9:21 AM  Result Value Ref Range   TSH 6.89 (H) 0.40 - 4.50 mIU/L  CBC     Status: None   Collection Time: 09/22/23 11:15 AM  Result Value Ref Range   WBC 5.8 4.0 - 10.5 K/uL   RBC 4.70 4.22 - 5.81 MIL/uL   Hemoglobin 13.8 13.0 - 17.0 g/dL   HCT 98.1 19.1 - 47.8 %   MCV 90.2 80.0 - 100.0 fL   MCH 29.4 26.0 - 34.0 pg   MCHC 32.5 30.0 - 36.0 g/dL   RDW 29.5 62.1 - 30.8 %   Platelets 161 150 - 400 K/uL   nRBC 0.0 0.0 - 0.2 %    Comment: Performed at [facility], [address] [place], [place] [address]  Basic metabolic panel     Status: Abnormal   Collection Time: 09/22/23 11:15 AM  Result Value Ref Range   Sodium 141 135 - 145 mmol/L   Potassium 4.2 3.5 - 5.1 mmol/L   Chloride 106 98 - 111 mmol/L   CO2 28 22 - 32 mmol/L   Glucose, Bld 186 (H) 70 - 99 mg/dL    Comment: Glucose reference range applies only to samples taken after fasting for at least 8 hours.   BUN 19 6 - 20 mg/dL   Creatinine, Ser 6.96 0.61 - 1.24 mg/dL   Calcium 8.6 (L) 8.9 - 10.3 mg/dL   GFR, Estimated >29 >52 mL/min    Comment: (NOTE) Calculated using the CKD-EPI Creatinine Equation (2021)    Anion gap 7 5 - 15    Comment: Performed at [facility], [address] [place], [place] [address]  Troponin I (High Sensitivity)     Status: None   Collection Time: 09/22/23 11:15 AM  Result Value Ref Range   Troponin I (High Sensitivity) 6 <18 ng/L    Comment: (NOTE) Elevated high sensitivity troponin I (hsTnI) values and significant  changes across serial measurements may suggest ACS but many other  chronic and acute conditions are known to elevate hsTnI results.  Refer to the "Links" section for chest pain algorithms and additional  guidance. Performed at [facility], [address] [place], [place] [address]   POCT glycosylated hemoglobin (Hb A1C)     Status:  Abnormal   Collection Time: 11/26/23  9:20 AM  Result Value Ref Range   Hemoglobin A1C 5.7 (A) 4.0 - 5.6 %   HbA1c POC (<> result, manual entry)     HbA1c, POC (prediabetic range)     HbA1c, POC (controlled diabetic range)       PHQ2/9:    11/26/2023    9:06 AM 09/05/2023    8:20 AM 07/29/2023    8:13 AM 07/24/2023    2:45 PM 03/12/2023    8:35 AM  Depression screen PHQ 2/9  Decreased Interest 0 0 0 0 0  Down, Depressed, Hopeless 0 0 0 0 0  PHQ - 2 Score 0 0 0 0 0  Altered sleeping 0 0 0  0  Tired, decreased energy 0 0 0  0  Change in appetite 0 0 0  0  Feeling bad or failure about yourself  0 0 0  0  Trouble concentrating 0 0 0  0  Moving slowly or fidgety/restless 0 0 0  0  Suicidal thoughts 0 0 0  0  PHQ-9 Score 0 0 0  0  Difficult doing work/chores Not difficult at all        phq 9 is negative   Fall Risk:    11/26/2023    9:06 AM 09/05/2023    8:20 AM 07/29/2023    8:13 AM 07/24/2023    2:39 PM 03/12/2023    8:35 AM  Fall Risk   Falls in the past year? 0 1 1 1 1   Number falls in past yr: 0 1 1 1 1   Injury with Fall? 0 1 1 1 1   Comment    head fall but refused to go to ER;torn shoulder ligaments   Risk for fall due to : No Fall Risks History of fall(s) History of fall(s);Orthopedic patient No Fall Risks No Fall Risks  Follow up Falls prevention discussed;Education provided;Falls evaluation completed Falls prevention discussed Falls prevention discussed;Education provided;Falls evaluation completed Education provided;Falls prevention discussed Falls prevention discussed      Functional Status Survey: Is the patient deaf or have difficulty hearing?: No Does the patient have difficulty seeing, even when wearing glasses/contacts?: No Does the patient have difficulty concentrating, remembering, or making decisions?: No Does the patient have difficulty walking or climbing stairs?: Yes Does the patient have difficulty dressing or bathing?: No Does the patient have  difficulty doing errands alone such as visiting a doctor's office or shopping?: No    Assessment & Plan  Assessment and Plan    Hypothyroidism TSH was elevated at 6.89 on of levothyroxine. Dose increased to daily. No symptoms of hypothyroidism reported. -Check TSH today to assess response to increased levothyroxine dose.  Type 2 Diabetes Mellitus A1C improved to 5.7 on Mounjaro and metformin. No symptoms of hyperglycemia reported. -Continue Mounjaro and metformin. -Check urine protein today.  Hyperlipidemia LDL was 32 on rosuvastatin. -Continue rosuvastatin.  Chronic Low Back Pain and Left Foot Drop Stable with Lyrica, Flexeril, and occasional Tylenol. -Continue current regimen.  MDDisorder recurrent  and Anxiety Stable on Seroquel, trazodone, and duloxetine. No current depressive symptoms reported. -Continue current regimen.  Gout No recent attacks. On allopurinol. -Continue allopurinol.  Erectile Dysfunction On Viagra. -Continue Viagra.  Post-Operative Status (Left Shoulder Arthroscopy) Reports improvement but still some soreness. Has not started physical therapy yet. -Encourage adherence to physical therapy once started.  Atherosclerosis of the Aorta Identified on previous CT scan. No current symptoms. -Continue current management of  blood pressure and cholesterol.  Obstructive Sleep Apnea Reports no current symptoms. No longer using CPAP since bariatric surgery. -Advise patient to ask spouse about potential snoring.  General Health Maintenance -Request copy of recent eye exam. -Continue monitoring for symptoms of obstructive sleep apnea. -Encourage physical therapy for post-operative shoulder recovery.

## 2023-11-21 ENCOUNTER — Other Ambulatory Visit: Payer: Self-pay | Admitting: Family Medicine

## 2023-11-21 DIAGNOSIS — E039 Hypothyroidism, unspecified: Secondary | ICD-10-CM

## 2023-11-21 MED ORDER — LEVOTHYROXINE SODIUM 88 MCG PO TABS
88.0000 ug | ORAL_TABLET | Freq: Every day | ORAL | 0 refills | Status: DC
Start: 2023-11-21 — End: 2024-03-26

## 2023-11-24 NOTE — Telephone Encounter (Signed)
Spoke to pt spouse and advised pt needed to come back for recheck on lab. Pt has an appt coming up on 11/26/23.

## 2023-11-26 ENCOUNTER — Ambulatory Visit: Payer: Medicare Other | Admitting: Family Medicine

## 2023-11-26 ENCOUNTER — Encounter: Payer: Self-pay | Admitting: Family Medicine

## 2023-11-26 VITALS — BP 122/86 | HR 92 | Temp 97.8°F | Resp 16 | Ht 71.0 in | Wt 276.4 lb

## 2023-11-26 DIAGNOSIS — M5441 Lumbago with sciatica, right side: Secondary | ICD-10-CM

## 2023-11-26 DIAGNOSIS — Z9889 Other specified postprocedural states: Secondary | ICD-10-CM | POA: Insufficient documentation

## 2023-11-26 DIAGNOSIS — M109 Gout, unspecified: Secondary | ICD-10-CM | POA: Diagnosis not present

## 2023-11-26 DIAGNOSIS — I700 Atherosclerosis of aorta: Secondary | ICD-10-CM

## 2023-11-26 DIAGNOSIS — M5442 Lumbago with sciatica, left side: Secondary | ICD-10-CM | POA: Diagnosis not present

## 2023-11-26 DIAGNOSIS — Z9884 Bariatric surgery status: Secondary | ICD-10-CM

## 2023-11-26 DIAGNOSIS — F339 Major depressive disorder, recurrent, unspecified: Secondary | ICD-10-CM

## 2023-11-26 DIAGNOSIS — G8929 Other chronic pain: Secondary | ICD-10-CM | POA: Diagnosis not present

## 2023-11-26 DIAGNOSIS — E039 Hypothyroidism, unspecified: Secondary | ICD-10-CM | POA: Diagnosis not present

## 2023-11-26 DIAGNOSIS — E785 Hyperlipidemia, unspecified: Secondary | ICD-10-CM

## 2023-11-26 DIAGNOSIS — G4709 Other insomnia: Secondary | ICD-10-CM

## 2023-11-26 DIAGNOSIS — E1169 Type 2 diabetes mellitus with other specified complication: Secondary | ICD-10-CM

## 2023-11-26 LAB — POCT GLYCOSYLATED HEMOGLOBIN (HGB A1C): Hemoglobin A1C: 5.7 % — AB (ref 4.0–5.6)

## 2023-11-26 MED ORDER — QUETIAPINE FUMARATE 25 MG PO TABS
25.0000 mg | ORAL_TABLET | Freq: Every day | ORAL | 1 refills | Status: DC
Start: 2023-11-26 — End: 2024-08-30

## 2023-11-26 MED ORDER — DULOXETINE HCL 60 MG PO CPEP
60.0000 mg | ORAL_CAPSULE | Freq: Every day | ORAL | 1 refills | Status: DC
Start: 2023-11-26 — End: 2024-07-21

## 2023-11-26 MED ORDER — TRAZODONE HCL 100 MG PO TABS
200.0000 mg | ORAL_TABLET | Freq: Every day | ORAL | 1 refills | Status: DC
Start: 2023-11-26 — End: 2024-07-21

## 2023-11-26 MED ORDER — ALLOPURINOL 100 MG PO TABS
100.0000 mg | ORAL_TABLET | Freq: Two times a day (BID) | ORAL | 1 refills | Status: DC
Start: 2023-11-26 — End: 2024-08-26

## 2023-11-26 MED ORDER — BUSPIRONE HCL 5 MG PO TABS
ORAL_TABLET | ORAL | 1 refills | Status: DC
Start: 2023-11-26 — End: 2024-07-15

## 2023-11-26 MED ORDER — METFORMIN HCL ER 750 MG PO TB24
750.0000 mg | ORAL_TABLET | Freq: Every day | ORAL | 1 refills | Status: DC
Start: 2023-11-26 — End: 2024-03-26

## 2023-11-26 MED ORDER — TIRZEPATIDE 10 MG/0.5ML ~~LOC~~ SOAJ
10.0000 mg | SUBCUTANEOUS | 1 refills | Status: DC
Start: 2023-11-26 — End: 2024-03-17

## 2023-11-26 MED ORDER — PREGABALIN 225 MG PO CAPS
225.0000 mg | ORAL_CAPSULE | Freq: Two times a day (BID) | ORAL | 1 refills | Status: DC
Start: 2023-11-26 — End: 2024-07-26

## 2023-11-26 MED ORDER — CYCLOBENZAPRINE HCL 10 MG PO TABS
10.0000 mg | ORAL_TABLET | Freq: Every day | ORAL | 1 refills | Status: DC
Start: 2023-11-26 — End: 2024-03-09

## 2023-11-26 MED ORDER — ROSUVASTATIN CALCIUM 5 MG PO TABS
5.0000 mg | ORAL_TABLET | Freq: Every day | ORAL | 1 refills | Status: DC
Start: 2023-11-26 — End: 2024-07-21

## 2023-11-28 LAB — MICROALBUMIN / CREATININE URINE RATIO
Creatinine, Urine: 77 mg/dL (ref 20–320)
Microalb, Ur: <0.2 mg/dL

## 2023-11-28 LAB — TSH: TSH: 2.41 m[IU]/L (ref 0.40–4.50)

## 2023-12-01 NOTE — Progress Notes (Unsigned)
12/03/2023 12:29 PM   [name] 02/23/1971 [id number]  Referring provider: Alba Cory, MD [address] [place],  [place] [address]  Urological history: 1. Testosterone deficiency -contributing factors of age, diabetes, sleep apnea, chronic opioid therapy and testicular cancer -testosterone level pending -hemoglobin/hematocrit (08/2023) 13.8/42.4 -testosterone cypionate 200 mg/mL, 1 cc every 14 days  2. ED -contributing factors of age, BPH, testosterone deficiency, HLD, hypothyroidism, obesity, sleep apnea, chronic opioid therapy, anxiety, depression and smoking -Sildenafil 100 mg on demand dosing   3. BPH with LU TS -PSA pending  No chief complaint on file.  HPI: [name] is a 52 y.o. male who presents today for follow up.   Previous records reviewed.   I PSS ***    Score:  1-7 Mild 8-19 Moderate 20-35 Severe    SHIM ***    Score: 1-7 Severe ED 8-11 Moderate ED 12-16 Mild-Moderate ED 17-21 Mild ED 22-25 No ED   PMH: Past Medical History:  Diagnosis Date   Allergy    Diabetes (HCC)    Gout    Hiatal hernia    Hyperlipidemia    Hypertension    Hypothyroidism    Metabolic syndrome    Obesity    OSA on CPAP    11 mmhg CPAP   Radiculopathy    Seborrhea    Testicular cancer (HCC)    Testicular hypofunction    Urine ketone    Vitamin D deficiency     Surgical History: Past Surgical History:  Procedure Laterality Date   BACK SURGERY     X 2   HERNIA REPAIR     left side and umbilical hernia repair   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 If you have any questions, ask your nurse or doctor.          albuterol 108 (90 Base) MCG/ACT  inhaler Commonly known as: VENTOLIN HFA Inhale 2 puffs into the lungs every 6 (six) hours as needed for wheezing or shortness of breath.   Allergy Relief 180 MG tablet Generic drug: fexofenadine TAKE 1 TABLET(180 MG) BY MOUTH DAILY   allopurinol 100 MG tablet Commonly known as: ZYLOPRIM Take 1 tablet (100 mg total) by mouth 2 (two) times daily.   busPIRone 5 MG tablet Commonly known as: BUSPAR TAKE 1 TO 2 TABLETS BY MOUTH THREE TIMES DAILY   cyclobenzaprine 10 MG tablet Commonly known as: FLEXERIL Take 1 tablet (10 mg total) by mouth at bedtime. TAKE 1 TABLET BY MOUTH AT BEDTIME   DULoxetine 60 MG capsule Commonly known as: CYMBALTA Take 1 capsule (60 mg total) by mouth daily.   fluticasone 50 MCG/ACT nasal spray Commonly known as: FLONASE Place 2 sprays into both nostrils daily.   levothyroxine 88 MCG tablet Commonly known as: SYNTHROID Take 1 tablet (88 mcg total) by mouth daily before breakfast.   Magnesium Oxide -Mg Supplement 500 MG Tabs Take 1 tablet by mouth once daily   metFORMIN 750 MG 24 hr tablet Commonly known as: GLUCOPHAGE-XR Take 1 tablet (750 mg total) by mouth daily with breakfast.   pregabalin 225 MG capsule Commonly known as: LYRICA Take  1 capsule (225 mg total) by mouth 2 (two) times daily.   QUEtiapine 25 MG tablet Commonly known as: SEROQUEL Take 1 tablet (25 mg total) by mouth at bedtime.   rosuvastatin 5 MG tablet Commonly known as: CRESTOR Take 1 tablet (5 mg total) by mouth daily.   sildenafil 100 MG tablet Commonly known as: VIAGRA TAKE 1 TABLET(100 MG) BY MOUTH DAILY AS NEEDED FOR ERECTILE DYSFUNCTION   testosterone cypionate 200 MG/ML injection Commonly known as: DEPOTESTOSTERONE CYPIONATE ADMINISTER 1 ML IN THE MUSCLE EVERY 14 DAYS   tirzepatide 10 MG/0.5ML Pen Commonly known as: MOUNJARO Inject 10 mg into the skin once a week.   traZODone 100 MG tablet Commonly known as: DESYREL Take 2 tablets (200 mg total) by mouth at  bedtime. TAKE 1 & 1/2 (ONE & ONE-HALF) TABLETS BY MOUTH AT BEDTIME        Allergies:  Allergies  Allergen Reactions   Morphine Itching    Family History: Family History  Problem Relation Age of Onset   Coronary artery disease Mother    Cancer Father        Melanoma and Testicular   Diabetes Father     Social History:  reports that he quit smoking about 31 years ago. His smoking use included cigarettes, cigars, and pipe. He started smoking about 39 years ago. He has a 24 pack-year smoking history. He quit smokeless tobacco use about 31 years ago.  His smokeless tobacco use included chew. He reports that he does not drink alcohol and does not use drugs.  ROS: Pertinent ROS in HPI  Physical Exam: There were no vitals taken for this visit.  Constitutional:  Well nourished. Alert and oriented, No acute distress. HEENT: [place] AT, moist mucus membranes.  Trachea midline, no masses. Cardiovascular: No clubbing, cyanosis, or edema. Respiratory: Normal respiratory effort, no increased work of breathing. GI: Abdomen is soft, non tender, non distended, no abdominal masses. Liver and spleen not palpable.  No hernias appreciated.  Stool sample for occult testing is not indicated.   GU: No CVA tenderness.  No bladder fullness or masses.  Patient with circumcised/uncircumcised phallus. ***Foreskin easily retracted***  Urethral meatus is patent.  No penile discharge. No penile lesions or rashes. Scrotum without lesions, cysts, rashes and/or edema.  Testicles are located scrotally bilaterally. No masses are appreciated in the testicles. Left and right epididymis are normal. Rectal: Patient with  normal sphincter tone. Anus and perineum without scarring or rashes. No rectal masses are appreciated. Prostate is approximately *** grams, *** nodules are appreciated. Seminal vesicles are normal. Skin: No rashes, bruises or suspicious lesions. Lymph: No cervical or inguinal adenopathy. Neurologic: Grossly  intact, no focal deficits, moving all 4 extremities. Psychiatric: Normal mood and affect.  Laboratory Data: Lab Results  Component Value Date   WBC 5.8 09/22/2023   HGB 13.8 09/22/2023   HCT 42.4 09/22/2023   MCV 90.2 09/22/2023   PLT 161 09/22/2023    Lab Results  Component Value Date   CREATININE 1.13 09/22/2023    Lab Results  Component Value Date   TESTOSTERONE 410 03/11/2023    Lab Results  Component Value Date   HGBA1C 5.7 (A) 11/26/2023    Lab Results  Component Value Date   TSH 2.41 11/26/2023       Component Value Date/Time   CHOL 81 03/12/2023 0924   CHOL 103 01/11/2015 0342   HDL 30 (L) 03/12/2023 0924   HDL 26 (L) 01/11/2015 0342   CHOLHDL  2.7 03/12/2023 0924   VLDL 17 05/14/2017 1101   VLDL 19 01/11/2015 0342   LDLCALC 32 03/12/2023 0924   LDLCALC 58 01/11/2015 0342    Lab Results  Component Value Date   AST 16 07/29/2023   Lab Results  Component Value Date   ALT 15 07/29/2023    Urinalysis    Component Value Date/Time   COLORURINE YELLOW (A) 06/17/2023 1802   APPEARANCEUR CLEAR (A) 06/17/2023 1802   APPEARANCEUR Clear 10/10/2022 1423   LABSPEC 1.018 06/17/2023 1802   PHURINE 5.0 06/17/2023 1802   GLUCOSEU NEGATIVE 06/17/2023 1802   HGBUR NEGATIVE 06/17/2023 1802   BILIRUBINUR NEGATIVE 06/17/2023 1802   BILIRUBINUR Negative 10/10/2022 1423   KETONESUR 5 (A) 06/17/2023 1802   PROTEINUR NEGATIVE 06/17/2023 1802   UROBILINOGEN 0.2 12/25/2009 1639   NITRITE NEGATIVE 06/17/2023 1802   LEUKOCYTESUR NEGATIVE 06/17/2023 1802  I have reviewed the labs.   Pertinent Imaging: N/A  Assessment & Plan:  ***  1.  Hypogonadism -testosterone levels pending -Hemoglobin/hematocrit normal -continue testosterone cypionate 200 mg/mL, 1 cc every 14 days    2. BPH with LUTS -PSA pending   3. Erectile dysfunction:    - *** No follow-ups on file.  These notes generated with voice recognition software. I apologize for typographical  errors.  Cloretta Ned  [facility] [address]  [address] [place], [place] [address] [phone]

## 2023-12-03 ENCOUNTER — Ambulatory Visit: Payer: Medicare Other | Admitting: Urology

## 2023-12-19 ENCOUNTER — Emergency Department
Admission: EM | Admit: 2023-12-19 | Discharge: 2023-12-20 | Disposition: A | Discharge status: Other Acute Inpatient Hospital | Payer: Medicare Other | Attending: Emergency Medicine | Admitting: Emergency Medicine

## 2023-12-19 ENCOUNTER — Other Ambulatory Visit: Payer: Self-pay

## 2023-12-19 DIAGNOSIS — Z9884 Bariatric surgery status: Secondary | ICD-10-CM | POA: Diagnosis not present

## 2023-12-19 DIAGNOSIS — E119 Type 2 diabetes mellitus without complications: Secondary | ICD-10-CM | POA: Insufficient documentation

## 2023-12-19 DIAGNOSIS — R112 Nausea with vomiting, unspecified: Secondary | ICD-10-CM | POA: Diagnosis not present

## 2023-12-19 DIAGNOSIS — R1084 Generalized abdominal pain: Secondary | ICD-10-CM | POA: Diagnosis not present

## 2023-12-19 DIAGNOSIS — K8020 Calculus of gallbladder without cholecystitis without obstruction: Secondary | ICD-10-CM | POA: Diagnosis not present

## 2023-12-19 DIAGNOSIS — K5730 Diverticulosis of large intestine without perforation or abscess without bleeding: Secondary | ICD-10-CM | POA: Diagnosis not present

## 2023-12-19 DIAGNOSIS — R109 Unspecified abdominal pain: Secondary | ICD-10-CM | POA: Diagnosis present

## 2023-12-19 DIAGNOSIS — E039 Hypothyroidism, unspecified: Secondary | ICD-10-CM | POA: Insufficient documentation

## 2023-12-19 DIAGNOSIS — K460 Unspecified abdominal hernia with obstruction, without gangrene: Secondary | ICD-10-CM | POA: Insufficient documentation

## 2023-12-19 DIAGNOSIS — K469 Unspecified abdominal hernia without obstruction or gangrene: Secondary | ICD-10-CM | POA: Diagnosis not present

## 2023-12-19 DIAGNOSIS — K828 Other specified diseases of gallbladder: Secondary | ICD-10-CM | POA: Diagnosis not present

## 2023-12-19 DIAGNOSIS — K458 Other specified abdominal hernia without obstruction or gangrene: Secondary | ICD-10-CM

## 2023-12-19 LAB — BASIC METABOLIC PANEL
Anion gap: 14 (ref 5–15)
BUN: 24 mg/dL — ABNORMAL HIGH (ref 6–20)
CO2: 27 mmol/L (ref 22–32)
Calcium: 10 mg/dL (ref 8.9–10.3)
Chloride: 98 mmol/L (ref 98–111)
Creatinine, Ser: 1.31 mg/dL — ABNORMAL HIGH (ref 0.61–1.24)
GFR, Estimated: >60 mL/min (ref >60)
Glucose, Bld: 173 mg/dL — ABNORMAL HIGH (ref 70–99)
Potassium: 4.6 mmol/L (ref 3.5–5.1)
Sodium: 139 mmol/L (ref 135–145)

## 2023-12-19 LAB — CBC WITH DIFFERENTIAL/PLATELET
Abs Immature Granulocytes: 0.07 10*3/uL (ref 0.00–0.07)
Basophils Absolute: 0 10*3/uL (ref 0.0–0.1)
Basophils Relative: 0 %
Eosinophils Absolute: 0 10*3/uL (ref 0.0–0.5)
Eosinophils Relative: 0 %
HCT: 49.6 % (ref 39.0–52.0)
Hemoglobin: 16.7 g/dL (ref 13.0–17.0)
Immature Granulocytes: 1 %
Lymphocytes Relative: 7 %
Lymphs Abs: 0.9 10*3/uL (ref 0.7–4.0)
MCH: 29.6 pg (ref 26.0–34.0)
MCHC: 33.7 g/dL (ref 30.0–36.0)
MCV: 87.9 fL (ref 80.0–100.0)
Monocytes Absolute: 0.3 10*3/uL (ref 0.1–1.0)
Monocytes Relative: 2 %
Neutro Abs: 11.3 10*3/uL — ABNORMAL HIGH (ref 1.7–7.7)
Neutrophils Relative %: 90 %
Platelets: 246 10*3/uL (ref 150–400)
RBC: 5.64 MIL/uL (ref 4.22–5.81)
RDW: 13.6 % (ref 11.5–15.5)
WBC: 12.6 10*3/uL — ABNORMAL HIGH (ref 4.0–10.5)
nRBC: 0 % (ref 0.0–0.2)

## 2023-12-20 ENCOUNTER — Emergency Department: Payer: Medicare Other

## 2023-12-20 DIAGNOSIS — R109 Unspecified abdominal pain: Secondary | ICD-10-CM | POA: Diagnosis not present

## 2023-12-20 DIAGNOSIS — K5651 Intestinal adhesions [bands], with partial obstruction: Secondary | ICD-10-CM | POA: Diagnosis not present

## 2023-12-20 DIAGNOSIS — K56609 Unspecified intestinal obstruction, unspecified as to partial versus complete obstruction: Secondary | ICD-10-CM | POA: Diagnosis not present

## 2023-12-20 DIAGNOSIS — Z9884 Bariatric surgery status: Secondary | ICD-10-CM | POA: Insufficient documentation

## 2023-12-20 DIAGNOSIS — I10 Essential (primary) hypertension: Secondary | ICD-10-CM | POA: Diagnosis not present

## 2023-12-20 DIAGNOSIS — K460 Unspecified abdominal hernia with obstruction, without gangrene: Secondary | ICD-10-CM | POA: Diagnosis not present

## 2023-12-20 DIAGNOSIS — R1084 Generalized abdominal pain: Secondary | ICD-10-CM | POA: Diagnosis not present

## 2023-12-20 DIAGNOSIS — K458 Other specified abdominal hernia without obstruction or gangrene: Secondary | ICD-10-CM | POA: Diagnosis not present

## 2023-12-20 DIAGNOSIS — K5730 Diverticulosis of large intestine without perforation or abscess without bleeding: Secondary | ICD-10-CM | POA: Diagnosis not present

## 2023-12-20 DIAGNOSIS — K8020 Calculus of gallbladder without cholecystitis without obstruction: Secondary | ICD-10-CM | POA: Diagnosis not present

## 2023-12-20 DIAGNOSIS — G4733 Obstructive sleep apnea (adult) (pediatric): Secondary | ICD-10-CM | POA: Diagnosis not present

## 2023-12-20 DIAGNOSIS — K828 Other specified diseases of gallbladder: Secondary | ICD-10-CM | POA: Diagnosis not present

## 2023-12-20 DIAGNOSIS — E039 Hypothyroidism, unspecified: Secondary | ICD-10-CM | POA: Diagnosis not present

## 2023-12-20 DIAGNOSIS — N179 Acute kidney failure, unspecified: Secondary | ICD-10-CM | POA: Diagnosis not present

## 2023-12-20 DIAGNOSIS — K469 Unspecified abdominal hernia without obstruction or gangrene: Secondary | ICD-10-CM | POA: Diagnosis not present

## 2023-12-20 DIAGNOSIS — E119 Type 2 diabetes mellitus without complications: Secondary | ICD-10-CM | POA: Diagnosis not present

## 2023-12-20 DIAGNOSIS — K565 Intestinal adhesions [bands], unspecified as to partial versus complete obstruction: Secondary | ICD-10-CM | POA: Diagnosis not present

## 2023-12-20 DIAGNOSIS — Z743 Need for continuous supervision: Secondary | ICD-10-CM | POA: Diagnosis not present

## 2023-12-20 DIAGNOSIS — R112 Nausea with vomiting, unspecified: Secondary | ICD-10-CM | POA: Diagnosis not present

## 2023-12-20 DIAGNOSIS — E7800 Pure hypercholesterolemia, unspecified: Secondary | ICD-10-CM | POA: Diagnosis not present

## 2023-12-20 HISTORY — PX: LAPAROSCOPY ABDOMEN DIAGNOSTIC: PRO50

## 2023-12-20 LAB — HEPATIC FUNCTION PANEL
ALT: 19 U/L (ref 0–44)
AST: 25 U/L (ref 15–41)
Albumin: 5 g/dL (ref 3.5–5.0)
Alkaline Phosphatase: 129 U/L — ABNORMAL HIGH (ref 38–126)
Bilirubin, Direct: 0.2 mg/dL (ref 0.0–0.2)
Indirect Bilirubin: 1 mg/dL — ABNORMAL HIGH (ref 0.3–0.9)
Total Bilirubin: 1.2 mg/dL — ABNORMAL HIGH (ref <1.2)
Total Protein: 8.4 g/dL — ABNORMAL HIGH (ref 6.5–8.1)

## 2023-12-20 LAB — LACTIC ACID, PLASMA: Lactic Acid, Venous: 1.5 mmol/L (ref 0.5–1.9)

## 2023-12-20 LAB — LIPASE, BLOOD: Lipase: 33 U/L (ref 11–51)

## 2023-12-20 MED ORDER — ONDANSETRON HCL 4 MG/2ML IJ SOLN
4.0000 mg | Freq: Once | INTRAMUSCULAR | Status: AC
  Administered 2023-12-20: 4 mg via INTRAVENOUS
  Filled 2023-12-20: qty 2

## 2023-12-20 MED ORDER — METOCLOPRAMIDE HCL 5 MG/ML IJ SOLN
10.0000 mg | Freq: Once | INTRAMUSCULAR | Status: AC
  Administered 2023-12-20: 10 mg via INTRAVENOUS
  Filled 2023-12-20: qty 2

## 2023-12-20 MED ORDER — IOHEXOL 300 MG/ML  SOLN
100.0000 mL | Freq: Once | INTRAMUSCULAR | Status: AC | PRN
  Administered 2023-12-20: 100 mL via INTRAVENOUS

## 2023-12-20 MED ORDER — HYDROMORPHONE HCL 1 MG/ML IJ SOLN
1.0000 mg | Freq: Once | INTRAMUSCULAR | Status: AC
  Administered 2023-12-20: 1 mg via INTRAVENOUS
  Filled 2023-12-20: qty 1

## 2023-12-20 MED ORDER — LACTATED RINGERS IV BOLUS
1000.0000 mL | Freq: Once | INTRAVENOUS | Status: AC
  Administered 2023-12-20: 1000 mL via INTRAVENOUS

## 2023-12-20 NOTE — ED Notes (Signed)
[facility] logistics transferred to this RN phone to give report to [facility] ED Consulting civil engineer.

## 2023-12-20 NOTE — ED Notes (Signed)
Attempted to call report x 2. Pt leaving with [facility] at this time, currently on hold with [facility] logistics center to give report as patient is being transferred ED to ED.

## 2023-12-20 NOTE — ED Provider Notes (Signed)
[facility] Provider Note    Event Date/Time   First MD Initiated Contact with Patient 12/20/23 0001     (approximate)   History   Emesis (Pt. Reports vomiting since last night at 930 pm, also reports abdominal cramps, reports he has vomited 30 times)   HPI  [name] is a 52 y.o. male who presents to the ED for evaluation of Emesis (Pt. Reports vomiting since last night at 930 pm, also reports abdominal cramps, reports he has vomited 30 times)   Review of PCP visit from 12/4.  History of hypothyroidism, DM.  History of Roux-en-Y gastric bypass.  Patient presents to the ED with his wife for evaluation about 24 hours of abdominal burning and emesis.  No stool or urinary changes.  No fevers.   Physical Exam   Triage Vital Signs: ED Triage Vitals  Encounter Vitals Group     BP 12/19/23 1812 (!) 156/111     Systolic BP Percentile --      Diastolic BP Percentile --      Pulse Rate 12/19/23 1809 65     Resp 12/19/23 1809 18     Temp 12/19/23 1809 97.8 F (36.6 C)     Temp Source 12/19/23 1809 Oral     SpO2 12/19/23 1809 95 %     Weight 12/19/23 1811 270 lb (122.5 kg)     Height 12/19/23 1811 5\' 11"  (1.803 m)     Head Circumference --      Peak Flow --      Pain Score 12/19/23 1811 10     Pain Loc --      Pain Education --      Exclude from Growth Chart --     Most recent vital signs: Vitals:   12/20/23 0000 12/20/23 0200  BP: (!) 168/103 (!) 158/93  Pulse: 64 75  Resp: 16 16  Temp:    SpO2: 98% 97%    General: Awake, no distress.  CV:  Good peripheral perfusion.  Resp:  Normal effort.  Abd:  No distention.  Mild tenderness throughout the left sided and central abdomen.  No peritoneal features MSK:  No deformity noted.  Neuro:  No focal deficits appreciated. Other:     ED Results / Procedures / Treatments   Labs (all labs ordered are listed, but only abnormal results are displayed) Labs Reviewed  CBC WITH  DIFFERENTIAL/PLATELET - Abnormal; Notable for the following components:      Result Value   WBC 12.6 (*)    Neutro Abs 11.3 (*)    All other components within normal limits  BASIC METABOLIC PANEL - Abnormal; Notable for the following components:   Glucose, Bld 173 (*)    BUN 24 (*)    Creatinine, Ser 1.31 (*)    All other components within normal limits  HEPATIC FUNCTION PANEL - Abnormal; Notable for the following components:   Total Protein 8.4 (*)    Alkaline Phosphatase 129 (*)    Total Bilirubin 1.2 (*)    Indirect Bilirubin 1.0 (*)    All other components within normal limits  LIPASE, BLOOD  LACTIC ACID, PLASMA  LACTIC ACID, PLASMA    EKG   RADIOLOGY CT abdomen/pelvis interpreted by me with an internal hernia with obstructive features  Official radiology report(s): CT ABDOMEN PELVIS W CONTRAST Result Date: 12/20/2023 CLINICAL DATA:  hx gastric bypass. left sided abd pain, recurrent emesis for 24hrs EXAM: CT ABDOMEN AND PELVIS  WITH CONTRAST TECHNIQUE: Multidetector CT imaging of the abdomen and pelvis was performed using the standard protocol following bolus administration of intravenous contrast. RADIATION DOSE REDUCTION: This exam was performed according to the departmental dose-optimization program which includes automated exposure control, adjustment of the mA and/or kV according to patient size and/or use of iterative reconstruction technique. CONTRAST:  OMNIPAQUE IOHEXOL 300 MG/ML  SOLN COMPARISON:  CT 01/10/2015 FINDINGS: Lower chest: Trace left pleural effusion. Patulous distal esophagus containing intraluminal fluid. Hepatobiliary: No focal liver abnormality. There are multiple layering gallstones. Gallbladder is moderately distended. No definite gallbladder wall thickening or pericholecystic inflammation. No biliary dilatation. Pancreas: Mild fatty infiltration. No ductal dilatation or inflammation. Spleen: Normal in size without focal abnormality. Adrenals/Urinary  Tract: No adrenal nodule. No hydronephrosis. There are multiple bilateral renal cysts. No further follow-up imaging is recommended. Cortical scarring in the lateral left kidney. Unremarkable urinary bladder. Stomach/Bowel: Dilated distal esophagus containing intraluminal fluid. Gastric bypass anatomy with fluid distending the gastric pouch as well as excluded gastric remnant. Excluded gastric remnant and duodenum are prominently dilated and fluid-filled. The Roux limb is dilated to the jejunal anastomosis where there is mesenteric swirling, for example series 2, image 15. There is also a dilated fluid-filled loop of small bowel in the right abdomen with transition point in the region of mesenteric swirling. Findings are concerning for internal hernia. Mild mesenteric edema and mesenteric free fluid but no bowel pneumatosis. Normal air-filled appendix. Colonic diverticulosis without focal diverticulitis. Small volume of stool in the colon. Vascular/Lymphatic: Aortic atherosclerosis. No aortic aneurysm. Patent portal vein. No portal venous or mesenteric gas. No abdominopelvic adenopathy. Reproductive: Prostate is unremarkable. 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 Other: No free air or perforation. Mesenteric edema and free fluid. Mildly complex free fluid in the pelvis. Soft tissue density or fluid in the right inguinal canal which is likely in part chronic and may be related to prior surgery. Prior ventral abdominal hernia repair with mesh. Musculoskeletal: L5-S1 postsurgical change. Adjacent level degenerative disc disease and facet hypertrophy. Degenerative change of the right hip. IMPRESSION: 1. Gastric bypass. Dilated fluid-filled proximal small bowel, gastric pouch and Roux limb as well as excluded gastric remnant and duodenum. Transition point as well as mesenteric swirling in the central abdomen suspicious for internal hernia and small-bowel obstruction. Recommend surgical  consultation. 2. Mild mesenteric edema and free fluid. 3. Cholelithiasis without definite cholecystitis. 4. Colonic diverticulosis without diverticulitis. Aortic Atherosclerosis (ICD10-I70.0). Electronically Signed   By: Narda Rutherford M.D.   On: 12/20/2023 01:48    PROCEDURES and INTERVENTIONS:  .Critical Care  Performed by: Delton Prairie, MD Authorized by: Delton Prairie, MD   Critical care provider statement:    Critical care time (minutes):  30   Critical care time was exclusive of:  Separately billable procedures and treating other patients   Critical care was necessary to treat or prevent imminent or life-threatening deterioration of the following conditions:  Trauma   Critical care was time spent personally by me on the following activities:  Development of treatment plan with patient or surrogate, discussions with consultants, evaluation of patient's response to treatment, examination of patient, ordering and review of laboratory studies, ordering and review of radiographic studies, ordering and performing treatments and interventions, pulse oximetry, re-evaluation of patient's condition and review of old charts   Medications  lactated ringers bolus 1,000 mL (1,000 mLs Intravenous New Bag/Given 12/20/23 0018)  ondansetron (ZOFRAN) injection 4 mg (4 mg Intravenous Given 12/20/23  0018)  iohexol (OMNIPAQUE) 300 MG/ML solution 100 mL (100 mLs Intravenous Contrast Given 12/20/23 0114)  HYDROmorphone (DILAUDID) injection 1 mg (1 mg Intravenous Given 12/20/23 0240)  metoCLOPramide (REGLAN) injection 10 mg (10 mg Intravenous Given 12/20/23 0236)     IMPRESSION / MDM / ASSESSMENT AND PLAN / ED COURSE  I reviewed the triage vital signs and the nursing notes.  Differential diagnosis includes, but is not limited to, SBO, medication side effect from his Mounjaro, pancreatitis, gastroenteritis.  {Patient presents with symptoms of an acute illness or injury that is potentially  life-threatening.  Patient is remotely s/p Roux-en-Y gastric bypass presents with about 24 hours of abdominal burning and recurrent emesis, with evidence of an internal hernia causing obstruction requiring transfer for emergent surgical intervention.  Reassuring vital signs and blood work.  Mild leukocytosis.  Pending lactic acid.  No metabolic derangements.  CT, as above.  I discussed with surgery here and at [facility].  Ultimately [facility] is able to make room for this patient to be transferred for surgical intervention.  Clinical Course as of 12/20/23 0304  Sat Dec 20, 2023  0156 Reassessed discussed my concerns for internal hernia and need for surgical evaluation.  They reaffirmed that he has gastric bypass done at [facility]. [DS]  4010 I consult with Dr. Maurine Minister. Surgery. He recommends trying [facility] considering patient's hx of gastric bypass. [DS]  0207 [facility] patient logistics, get info and will call back with a doc [DS]  0225 Dr. Yetta Flock. [facility]ariat[facility]. She would accept, but they have no beds. She recommends checking a lactic and adding more IVF.   I update Dr. Maurine Minister of this and have secretaries try reaching out to [facility] next [DS]  0251 Callback from [facility] patient logistics. They were able to make room or something. Will take him as an ED to ED transfer with Yetta Flock accepting. I updated patient and wife of this. Working on transport now [DS]    Clinical Course User Index [DS] Delton Prairie, MD     FINAL CLINICAL IMPRESSION(S) / ED DIAGNOSES   Final diagnoses:  Internal hernia  History of Roux-en-Y gastric bypass     Rx / DC Orders   ED Discharge Orders     None        Note:  This document was prepared using Dragon voice recognition software and may include unintentional dictation errors.   Delton Prairie, MD 12/20/23 [phone]

## 2023-12-20 NOTE — ED Notes (Signed)
This RN continues to be on hold with [facility] logistics center attempting to give report, no answer.

## 2023-12-20 NOTE — ED Notes (Signed)
Received call back from [facility] patient accepted Ed to Ed will arrange transport with [facility] or [facility] which ever can assist with transport

## 2023-12-20 NOTE — ED Notes (Signed)
Called [facility] logistics to request transfer to Bariatrics spoke to Rep: Satima @0200  gave demographics/powershared images & faxed Demographics @0205  waiting on call back

## 2023-12-20 NOTE — ED Notes (Signed)
Call report to [phone] Rep: Margarite Gouge accepting provider Alvis Lemmings MD/ prior to patient leaving please complete all consents,Med.Nec. & Emtala prior to EMS arrival

## 2023-12-26 ENCOUNTER — Telehealth: Payer: Self-pay

## 2023-12-26 NOTE — Transitions of Care (Post Inpatient/ED Visit) (Signed)
   12/26/2023  Name: [name] MRN: [id number] DOB: Nov 17, 1971  Today's TOC FU Call Status: Today's TOC FU Call Status:: Unsuccessful Call (3rd Attempt) Unsuccessful Call (1st Attempt) Date: 12/23/23 Unsuccessful Call (2nd Attempt) Date: 12/25/23 Unsuccessful Call (3rd Attempt) Date: 12/26/23  Attempted to reach the patient regarding the most recent Inpatient/ED visit.  Follow Up Plan: No further outreach attempts will be made at this time. We have been unable to contact the patient.   Bari Mayans , BSN, RN Care Management Coordinator [place]   [facility] [email or web address] Direct Dial: [phone]

## 2024-01-07 DIAGNOSIS — M25511 Pain in right shoulder: Secondary | ICD-10-CM | POA: Diagnosis not present

## 2024-01-09 DIAGNOSIS — M25511 Pain in right shoulder: Secondary | ICD-10-CM | POA: Diagnosis not present

## 2024-01-16 DIAGNOSIS — M25511 Pain in right shoulder: Secondary | ICD-10-CM | POA: Diagnosis not present

## 2024-01-30 DIAGNOSIS — S46011A Strain of muscle(s) and tendon(s) of the rotator cuff of right shoulder, initial encounter: Secondary | ICD-10-CM | POA: Diagnosis not present

## 2024-02-06 ENCOUNTER — Encounter: Payer: Self-pay | Admitting: Family Medicine

## 2024-02-06 ENCOUNTER — Ambulatory Visit (INDEPENDENT_AMBULATORY_CARE_PROVIDER_SITE_OTHER): Payer: Medicare Other | Admitting: Family Medicine

## 2024-02-06 VITALS — BP 106/72 | HR 83 | Temp 97.7°F | Resp 16 | Ht 71.0 in | Wt 264.0 lb

## 2024-02-06 DIAGNOSIS — Z7984 Long term (current) use of oral hypoglycemic drugs: Secondary | ICD-10-CM | POA: Diagnosis not present

## 2024-02-06 DIAGNOSIS — Z9884 Bariatric surgery status: Secondary | ICD-10-CM | POA: Diagnosis not present

## 2024-02-06 DIAGNOSIS — M7541 Impingement syndrome of right shoulder: Secondary | ICD-10-CM | POA: Diagnosis not present

## 2024-02-06 DIAGNOSIS — I9589 Other hypotension: Secondary | ICD-10-CM | POA: Diagnosis not present

## 2024-02-06 DIAGNOSIS — E1169 Type 2 diabetes mellitus with other specified complication: Secondary | ICD-10-CM

## 2024-02-06 DIAGNOSIS — E785 Hyperlipidemia, unspecified: Secondary | ICD-10-CM | POA: Diagnosis not present

## 2024-02-06 NOTE — Progress Notes (Signed)
Name: [name]   MRN: [id number]    DOB: [birth year]   Date:02/06/2024       Progress Note  Subjective  Chief Complaint  Chief Complaint  Patient presents with   Pre-op Exam   Discussed the use of AI scribe software for clinical note transcription with the patient, who gave verbal consent to proceed.  History of Present Illness   [name] "[name]" is a 53 year old male with hypertension and diabetes who presents for preoperative evaluation for right shoulder rotator cuff repair.  The shoulder issue began a couple of months ago following an injury where he 'pulled on something and it popped.' Since then, he has experienced significant pain and very limited function in the shoulder. An MRI was performed, and a corticosteroid injection was attempted but did not provide relief, leading to the decision for surgical intervention.  He has a history of hypertension, which resolved after bariatric surgery in 2013 but recurred with weight gain. Recently, he has been losing weight again with the use of Mounjaro for diabetes management, which has led to orthostatic changes and low blood pressure readings, such as 106/72 mmHg. He experiences occasional dizziness upon standing. He is not currently on any blood pressure medications.  His diabetes is well-controlled with a recent A1c of 5.7% as of December 4th. He is on Mounjaro and metformin, which are to be held prior to surgery.  He underwent bariatric surgery in 2013 and more recently had surgery for an internal hernia and adhesive small bowel obstruction on December 20, 2023, with a follow-up on January 05, 2024. He reports no complications from this recent surgery.  No current upper respiratory infections, cough, chest pain, palpitations, or respiratory issues. He has a history of sleep apnea, which has resolved with weight loss.       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   SBO (small  bowel obstruction) (HCC) 12/20/2023   History of arthroscopic surgery of shoulder 11/26/2023   Chronic pain 06/17/2023   Polypharmacy 06/17/2023   Anxiety and depression 01/04/2023   Type 2 diabetes mellitus with peripheral neuropathy (HCC) 01/04/2023   B12 deficiency 06/11/2016   Allergic conjunctivitis 06/11/2016   Muscle cramps 06/11/2016   Dyslipidemia 11/02/2015   Acquired nystagmus 08/01/2015   Chronic bilateral low back pain with bilateral sciatica 06/29/2015   Carpal tunnel syndrome 06/29/2015   Osteoarthritis 06/29/2015   Claustrophobia 06/29/2015   Foot drop, left 06/29/2015   Hiatal hernia 06/29/2015   H/O testicular cancer 06/29/2015   Acquired hypothyroidism 06/29/2015   Allergic rhinitis 06/29/2015   Seborrheic keratoses 06/29/2015   Vitamin D deficiency 06/29/2015    Past Surgical History:  Procedure Laterality Date   BACK SURGERY     X 2   HERNIA REPAIR     left side and umbilical hernia repair   LAPAROSCOPY ABDOMEN DIAGNOSTIC  12/20/2023   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   SHOULDER ARTHROSCOPY W/ ROTATOR CUFF REPAIR Left 01/02/2023   URETEROTOMY Left 12/23/1998    Family History  Problem Relation Age of Onset   Coronary artery disease Mother    Cancer Father        Melanoma and Testicular   Diabetes Father     Social History   Tobacco Use   Smoking status: Former    Current packs/day: 0.00    Average packs/day: 3.0 packs/day for 8.0 years (24.0 ttl pk-yrs)    Types: Cigarettes,  Cigars, Pipe    Start date: 06/28/1984    Quit date: 12/24/1991    Years since quitting: 32.1   Smokeless tobacco: Former    Types: Chew    Quit date: 12/24/1991  Substance Use Topics   Alcohol use: No    Alcohol/week: 0.0 standard drinks of alcohol     Current Outpatient Medications:    albuterol (VENTOLIN HFA) 108 (90 Base) MCG/ACT inhaler, Inhale 2 puffs into the lungs every 6 (six) hours as needed for wheezing or shortness of breath., Disp: 8 g, Rfl: 2    ALLERGY RELIEF 180 MG tablet, TAKE 1 TABLET(180 MG) BY MOUTH DAILY, Disp: 90 tablet, Rfl: 0   allopurinol (ZYLOPRIM) 100 MG tablet, Take 1 tablet (100 mg total) by mouth 2 (two) times daily., Disp: 180 tablet, Rfl: 1   busPIRone (BUSPAR) 5 MG tablet, TAKE 1 TO 2 TABLETS BY MOUTH THREE TIMES DAILY, Disp: 180 tablet, Rfl: 1   cyclobenzaprine (FLEXERIL) 10 MG tablet, Take 1 tablet (10 mg total) by mouth at bedtime. TAKE 1 TABLET BY MOUTH AT BEDTIME, Disp: 90 tablet, Rfl: 1   DULoxetine (CYMBALTA) 60 MG capsule, Take 1 capsule (60 mg total) by mouth daily., Disp: 90 capsule, Rfl: 1   fluticasone (FLONASE) 50 MCG/ACT nasal spray, Place 2 sprays into both nostrils daily., Disp: 16 g, Rfl: 6   levothyroxine (SYNTHROID) 88 MCG tablet, Take 1 tablet (88 mcg total) by mouth daily before breakfast., Disp: 30 tablet, Rfl: 0   Magnesium Oxide 500 MG TABS, Take 1 tablet by mouth once daily, Disp: 90 tablet, Rfl: 0   metFORMIN (GLUCOPHAGE-XR) 750 MG 24 hr tablet, Take 1 tablet (750 mg total) by mouth daily with breakfast., Disp: 90 tablet, Rfl: 1   pregabalin (LYRICA) 225 MG capsule, Take 1 capsule (225 mg total) by mouth 2 (two) times daily., Disp: 180 capsule, Rfl: 1   QUEtiapine (SEROQUEL) 25 MG tablet, Take 1 tablet (25 mg total) by mouth at bedtime., Disp: 90 tablet, Rfl: 1   rosuvastatin (CRESTOR) 5 MG tablet, Take 1 tablet (5 mg total) by mouth daily., Disp: 90 tablet, Rfl: 1   sildenafil (VIAGRA) 100 MG tablet, TAKE 1 TABLET(100 MG) BY MOUTH DAILY AS NEEDED FOR ERECTILE DYSFUNCTION, Disp: 30 tablet, Rfl: 0   testosterone cypionate (DEPOTESTOSTERONE CYPIONATE) 200 MG/ML injection, ADMINISTER 1 ML IN THE MUSCLE EVERY 14 DAYS, Disp: 10 mL, Rfl: 0   tirzepatide (MOUNJARO) 10 MG/0.5ML Pen, Inject 10 mg into the skin once a week., Disp: 6 mL, Rfl: 1   traZODone (DESYREL) 100 MG tablet, Take 2 tablets (200 mg total) by mouth at bedtime. TAKE 1 & 1/2 (ONE & ONE-HALF) TABLETS BY MOUTH AT BEDTIME, Disp: 180 tablet,  Rfl: 1  Allergies  Allergen Reactions   Morphine Itching    I personally reviewed active problem list, medication list, allergies with the patient/caregiver today.   ROS  Ten systems reviewed and is negative except as mentioned in HPI    Objective  Vitals:   02/06/24 0754  BP: 106/72  Pulse: 83  Resp: 16  Temp: 97.7 F (36.5 C)  TempSrc: Oral  Weight: 264 lb (119.7 kg)  Height: 5\' 11"  (1.803 m)    Body mass index is 36.82 kg/m.  Physical Exam  Constitutional: Patient appears well-developed and well-nourished. Obese  No distress.  HEENT: head atraumatic, normocephalic, pupils equal and reactive to light, neck supple, throat within normal limits Cardiovascular: Normal rate, regular rhythm and normal heart sounds.  No  murmur heard. No BLE edema. Pulmonary/Chest: Effort normal and breath sounds normal. No respiratory distress. Muscular skeletal: positive for impingement sign Abdominal: Soft. No pain Psychiatric: Patient has a normal mood and affect. behavior is normal. Judgment and thought content normal.   Recent Results (from the past 2160 hours)  POCT glycosylated hemoglobin (Hb A1C)     Status: Abnormal   Collection Time: 11/26/23  9:20 AM  Result Value Ref Range   Hemoglobin A1C 5.7 (A) 4.0 - 5.6 %   HbA1c POC (<> result, manual entry)     HbA1c, POC (prediabetic range)     HbA1c, POC (controlled diabetic range)    Microalbumin / creatinine urine ratio     Status: None   Collection Time: 11/26/23 10:06 AM  Result Value Ref Range   Creatinine, Urine 77 20 - 320 mg/dL   Microalb, Ur <4.0 mg/dL    Comment: Reference Range Not established    Microalb Creat Ratio NOTE <30 mg/g creat    Comment: NOTE: The urine albumin value is less than  0.2 mg/dL therefore we are unable to calculate  excretion and/or creatinine ratio. . The ADA defines abnormalities in albumin excretion as follows: Marland Kitchen Albuminuria Category        Result (mg/g creatinine) . Normal to  Mildly increased   <30 Moderately increased         30-299  Severely increased           > OR = 300 . The ADA recommends that at least two of three specimens collected within a 3-6 month period be abnormal before considering a patient to be within a diagnostic category.   TSH     Status: None   Collection Time: 11/26/23 10:06 AM  Result Value Ref Range   TSH 2.41 0.40 - 4.50 mIU/L  CBC with Differential     Status: Abnormal   Collection Time: 12/19/23  6:13 PM  Result Value Ref Range   WBC 12.6 (H) 4.0 - 10.5 K/uL   RBC 5.64 4.22 - 5.81 MIL/uL   Hemoglobin 16.7 13.0 - 17.0 g/dL   HCT 98.1 19.1 - 47.8 %   MCV 87.9 80.0 - 100.0 fL   MCH 29.6 26.0 - 34.0 pg   MCHC 33.7 30.0 - 36.0 g/dL   RDW 29.5 62.1 - 30.8 %   Platelets 246 150 - 400 K/uL   nRBC 0.0 0.0 - 0.2 %   Neutrophils Relative % 90 %   Neutro Abs 11.3 (H) 1.7 - 7.7 K/uL   Lymphocytes Relative 7 %   Lymphs Abs 0.9 0.7 - 4.0 K/uL   Monocytes Relative 2 %   Monocytes Absolute 0.3 0.1 - 1.0 K/uL   Eosinophils Relative 0 %   Eosinophils Absolute 0.0 0.0 - 0.5 K/uL   Basophils Relative 0 %   Basophils Absolute 0.0 0.0 - 0.1 K/uL   Immature Granulocytes 1 %   Abs Immature Granulocytes 0.07 0.00 - 0.07 K/uL    Comment: Performed at [facility], [address] [place], [place] [address]  Basic metabolic panel     Status: Abnormal   Collection Time: 12/19/23  6:13 PM  Result Value Ref Range   Sodium 139 135 - 145 mmol/L   Potassium 4.6 3.5 - 5.1 mmol/L   Chloride 98 98 - 111 mmol/L   CO2 27 22 - 32 mmol/L   Glucose, Bld 173 (H) 70 - 99 mg/dL    Comment: Glucose reference range applies only  to samples taken after fasting for at least 8 hours.   BUN 24 (H) 6 - 20 mg/dL   Creatinine, Ser 1.02 (H) 0.61 - 1.24 mg/dL   Calcium 72.5 8.9 - 36.6 mg/dL   GFR, Estimated >44 >03 mL/min    Comment: (NOTE) Calculated using the CKD-EPI Creatinine Equation (2021)    Anion gap 14 5 - 15    Comment: Performed at [facility], [address] [place], [place] [address]  Hepatic function panel     Status: Abnormal   Collection Time: 12/19/23  6:13 PM  Result Value Ref Range   Total Protein 8.4 (H) 6.5 - 8.1 g/dL   Albumin 5.0 3.5 - 5.0 g/dL   AST 25 15 - 41 U/L   ALT 19 0 - 44 U/L   Alkaline Phosphatase 129 (H) 38 - 126 U/L   Total Bilirubin 1.2 (H) <1.2 mg/dL   Bilirubin, Direct 0.2 0.0 - 0.2 mg/dL   Indirect Bilirubin 1.0 (H) 0.3 - 0.9 mg/dL    Comment: Performed at [facility], [address] [place], [place] [address]  Lipase, blood     Status: None   Collection Time: 12/19/23  6:13 PM  Result Value Ref Range   Lipase 33 11 - 51 U/L    Comment: Performed at [facility], [address] [place], [place] [address]  Lactic acid, plasma     Status: None   Collection Time: 12/20/23  2:30 AM  Result Value Ref Range   Lactic Acid, Venous 1.5 0.5 - 1.9 mmol/L    Comment: Performed at [facility], [address] [place], [place] [address]    Diabetic Foot Exam:     PHQ2/9:    02/06/2024    7:52 AM 11/26/2023    9:06 AM 09/05/2023    8:20 AM 07/29/2023    8:13 AM 07/24/2023    2:45 PM  Depression screen PHQ 2/9  Decreased Interest 0 0 0 0 0  Down, Depressed, Hopeless 0 0 0 0 0  PHQ - 2 Score 0 0 0 0 0  Altered sleeping 0 0 0 0   Tired, decreased energy 0 0 0 0   Change in appetite 0 0 0 0   Feeling bad or failure about yourself  0 0 0 0   Trouble concentrating 0 0 0 0   Moving slowly or fidgety/restless 0 0 0 0   Suicidal thoughts 0 0 0 0   PHQ-9 Score 0 0 0 0   Difficult doing work/chores Not difficult at all Not difficult at all       phq 9 is negative  Fall Risk:    02/06/2024    7:52 AM 11/26/2023    9:06 AM 09/05/2023    8:20 AM 07/29/2023    8:13 AM 07/24/2023    2:39 PM  Fall Risk   Falls in the past year? 0 0 1 1 1   Number falls in past yr: 0 0 1 1 1   Injury with Fall? 0 0 1 1 1   Comment     head fall but refused to go to ER;torn shoulder  ligaments  Risk for fall due to : No Fall Risks No Fall Risks History of fall(s) History of fall(s);Orthopedic patient No Fall Risks  Follow up Falls prevention discussed;Education provided;Falls evaluation completed Falls prevention discussed;Education provided;Falls evaluation completed Falls prevention discussed Falls prevention discussed;Education provided;Falls evaluation completed Education provided;Falls prevention discussed  Assessment & Plan    Right Shoulder Injury Recent injury with limited function and persistent pain despite corticosteroid injection. MRI confirmed need for arthroscopic rotator cuff repair. - Proceed with surgery under general anesthesia and interscalene block. No restrictions identified.  Hypotension Blood pressure on the lower end of normal, possibly due to recent weight loss. No symptoms of dizziness reported. - Encouraged to stay hydrated, especially prior to surgery to prevent intraoperative hypotension.  Diabetes Well-controlled with Mounjaro and Metformin. Recent A1c was 5.7 in December. - Hold [facility] for 7 days prior to surgery and Metformin for 24 hours prior to surgery.  History of Bariatric Surgery No current complications reported from gastric bypass surgery in 2013. Recent weight loss noted. - Continue regular follow-up for diabetes and weight management.  Recent Abdominal Surgery Recent laparoscopic lysis of adhesions due to adhesive small bowel obstruction. No current complications reported. - No restrictions identified for upcoming shoulder surgery.  General Health Maintenance - Continue high protein diet for weight management. - Follow-up after surgery to monitor recovery.

## 2024-03-03 ENCOUNTER — Telehealth: Payer: Self-pay | Admitting: Family Medicine

## 2024-03-03 ENCOUNTER — Telehealth: Payer: Self-pay

## 2024-03-03 ENCOUNTER — Encounter (HOSPITAL_BASED_OUTPATIENT_CLINIC_OR_DEPARTMENT_OTHER): Payer: Self-pay | Admitting: Orthopedic Surgery

## 2024-03-03 ENCOUNTER — Other Ambulatory Visit: Payer: Self-pay

## 2024-03-03 NOTE — Telephone Encounter (Signed)
 Tried to do PA:  This medication or product was previously approved on [id number] from 2024-03-03 to 2024-12-22. **Please note: This request was submitted electronically

## 2024-03-03 NOTE — Telephone Encounter (Signed)
 PA has been previously approved

## 2024-03-03 NOTE — Progress Notes (Signed)
   03/03/24 1128  OBSTRUCTIVE SLEEP APNEA  Have you ever been diagnosed with sleep apnea through a sleep study? Yes  If yes, do you have and use a CPAP or BPAP machine every night? 0  Do you snore loudly (loud enough to be heard through closed doors)?  1  Do you often feel tired, fatigued, or sleepy during the daytime (such as falling asleep during driving or talking to someone)? 1  Has anyone observed you stop breathing during your sleep? 1  Do you have, or are you being treated for high blood pressure? 0  BMI more than 35 kg/m2? 1  Age > 50 (1-yes) 1  Male Gender (Yes=1) 1  Obstructive Sleep Apnea Score 6  Score 5 or greater  (S)  Results sent to PCP (Dr Carlynn Purl)

## 2024-03-03 NOTE — Telephone Encounter (Signed)
 Prior Authorization from [employer]  tirzepatide [facility]) 10 MG/0.5ML Pen   Key: [id number]

## 2024-03-03 NOTE — Telephone Encounter (Signed)
 Prior Auth from [place] on   tirzepatide [facility]) 10 MG/0.5ML Pen

## 2024-03-05 ENCOUNTER — Encounter (HOSPITAL_BASED_OUTPATIENT_CLINIC_OR_DEPARTMENT_OTHER)
Admission: RE | Admit: 2024-03-05 | Discharge: 2024-03-05 | Disposition: A | Discharge status: Home / Self Care | Source: Ambulatory Visit | Attending: Orthopedic Surgery | Admitting: Orthopedic Surgery

## 2024-03-05 DIAGNOSIS — Z01812 Encounter for preprocedural laboratory examination: Secondary | ICD-10-CM | POA: Diagnosis not present

## 2024-03-05 DIAGNOSIS — E1140 Type 2 diabetes mellitus with diabetic neuropathy, unspecified: Secondary | ICD-10-CM | POA: Insufficient documentation

## 2024-03-05 DIAGNOSIS — Z01818 Encounter for other preprocedural examination: Secondary | ICD-10-CM | POA: Diagnosis present

## 2024-03-05 LAB — BASIC METABOLIC PANEL
Anion gap: 4 — ABNORMAL LOW (ref 5–15)
BUN: 11 mg/dL (ref 6–20)
CO2: 31 mmol/L (ref 22–32)
Calcium: 9.2 mg/dL (ref 8.9–10.3)
Chloride: 101 mmol/L (ref 98–111)
Creatinine, Ser: 1.24 mg/dL (ref 0.61–1.24)
GFR, Estimated: >60 mL/min (ref >60)
Glucose, Bld: 157 mg/dL — ABNORMAL HIGH (ref 70–99)
Potassium: 4.5 mmol/L (ref 3.5–5.1)
Sodium: 136 mmol/L (ref 135–145)

## 2024-03-05 NOTE — Progress Notes (Signed)

## 2024-03-06 ENCOUNTER — Emergency Department

## 2024-03-06 ENCOUNTER — Emergency Department
Admission: EM | Admit: 2024-03-06 | Discharge: 2024-03-06 | Disposition: A | Discharge status: Home / Self Care | Attending: Emergency Medicine | Admitting: Emergency Medicine

## 2024-03-06 ENCOUNTER — Other Ambulatory Visit: Payer: Self-pay

## 2024-03-06 DIAGNOSIS — E119 Type 2 diabetes mellitus without complications: Secondary | ICD-10-CM | POA: Diagnosis not present

## 2024-03-06 DIAGNOSIS — I10 Essential (primary) hypertension: Secondary | ICD-10-CM | POA: Diagnosis not present

## 2024-03-06 DIAGNOSIS — W270XXA Contact with workbench tool, initial encounter: Secondary | ICD-10-CM | POA: Diagnosis not present

## 2024-03-06 DIAGNOSIS — E039 Hypothyroidism, unspecified: Secondary | ICD-10-CM | POA: Diagnosis not present

## 2024-03-06 DIAGNOSIS — S6991XA Unspecified injury of right wrist, hand and finger(s), initial encounter: Secondary | ICD-10-CM | POA: Diagnosis present

## 2024-03-06 DIAGNOSIS — S67196A Crushing injury of right little finger, initial encounter: Secondary | ICD-10-CM | POA: Diagnosis not present

## 2024-03-06 NOTE — ED Notes (Signed)
 Pt noted to have a small 1cm red spot on anterior side of finger. Pt states that a blood blister formed after smashing finger that he popped. Pt denies any needs at this time. Pt ambulatory to room with steady gait. A&Ox4. In NAD.

## 2024-03-06 NOTE — ED Triage Notes (Signed)
 Pt to ED for injury to R pinkie finger 2 days ago while putting away tools. Pt states finger was "mashed with a pair of pliers". Finger is slightly swollen with small area of broken skin. Last Tdap 1 year ago.

## 2024-03-06 NOTE — ED Provider Notes (Signed)
 [facility] Provider Note    Event Date/Time   First MD Initiated Contact with Patient 03/06/24 [phone]     (approximate)   History   Finger Injury   HPI  [name] is a 53 y.o. male   presents to the ED with complaint of right fifth finger injury that occurred 2 days ago.  Patient states that finger has been swollen.  He also bumped it against the bed last evening causing it to increase in pain.  Patient has history of diabetes, hypertension, hypothyroidism, testicular cancer, vitamin D deficiency, osteoarthritis and gastric bypass.      Physical Exam   Triage Vital Signs: ED Triage Vitals  Encounter Vitals Group     BP 03/06/24 0820 (!) 134/97     Systolic BP Percentile --      Diastolic BP Percentile --      Pulse Rate 03/06/24 0820 82     Resp 03/06/24 0820 18     Temp 03/06/24 0820 97.7 F (36.5 C)     Temp Source 03/06/24 0820 Oral     SpO2 03/06/24 0820 100 %     Weight 03/06/24 0819 267 lb (121.1 kg)     Height 03/06/24 0819 5\' 9"  (1.753 m)     Head Circumference --      Peak Flow --      Pain Score 03/06/24 0819 7     Pain Loc --      Pain Education --      Exclude from Growth Chart --     Most recent vital signs: Vitals:   03/06/24 0820  BP: (!) 134/97  Pulse: 82  Resp: 18  Temp: 97.7 F (36.5 C)  SpO2: 100%     General: Awake, no distress.  CV:  Good peripheral perfusion.  Resp:  Normal effort.  Abd:  No distention.  Other:  Right fifth digit distally with moderate amount of soft tissue edema and tenderness.  Nail is intact.  No subungual hematoma present.  No gross deformity noted.  Motor or sensory function and capillary refill are within normal limits.   ED Results / Procedures / Treatments   Labs (all labs ordered are listed, but only abnormal results are displayed) Labs Reviewed - No data to display    RADIOLOGY X-ray images of the right fifth digit were reviewed and interpreted by myself  independent of the radiologist and was negative for fracture.  Official radiology report also is negative for fracture but notes soft tissue edema.    PROCEDURES:  Critical Care performed:   Procedures   MEDICATIONS ORDERED IN ED: Medications - No data to display   IMPRESSION / MDM / ASSESSMENT AND PLAN / ED COURSE  I reviewed the triage vital signs and the nursing notes.   Differential diagnosis includes, but is not limited to, contusion, crush injury, fracture right fifth digit, dislocation.  53 year old male presents to the ED with complaint of injury to his right fifth digit that occurred 2 days ago.  X-rays were reassuring and patient was made aware that he does not have a fracture.  He was instructed to take Tylenol as needed for pain and elevate with ice as needed.  Patient is status post gastric bypass where anti-inflammatories are discouraged.      Patient's presentation is most consistent with acute complicated illness / injury requiring diagnostic workup.  FINAL CLINICAL IMPRESSION(S) / ED DIAGNOSES   Final diagnoses:  Crushing injury of  right little finger, initial encounter     Rx / DC Orders   ED Discharge Orders     None        Note:  This document was prepared using Dragon voice recognition software and may include unintentional dictation errors.   Tommi Rumps, PA-C 03/06/24 1237    Jene Every, MD 03/06/24 [phone]

## 2024-03-06 NOTE — Discharge Instructions (Signed)
 Follow-up with your primary care provider if any continued problems or concerns.  Ice and elevation to reduce swelling and help with pain.  Because of your gastric bypass surgery it is recommended that you only take Tylenol as needed for pain as anti-inflammatories could increase stomach problems.  A splint was applied for protection and support to be worn as needed.

## 2024-03-08 NOTE — H&P (Signed)
 PREOPERATIVE H&P  Chief Complaint: right shoulder pain  HPI: [name] is a 53 y.o. male who presents for right shoulder pain.  He has persistent pain.  He feels like things are getting worse.  He cannot play with his grandkids.  He cannot lift his arm. Hx of left cuff repair and revision cuff repair. MRI demonstrates a full thickness cuff tear of the supraspinatus. This is significantly impairing activities of daily living.  He has elected for surgical management.   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   SHOULDER ARTHROSCOPY W/ ROTATOR CUFF REPAIR Left 01/02/2023   URETEROTOMY Left 12/23/1998   Social History   Socioeconomic History   Marital status: Married    Spouse name: Not on file   Number of children: 2   Years of education: Not on file   Highest education level: Some college, no degree  Occupational History   Occupation: grave digger     Comment: [employer]   Tobacco Use   Smoking status: Every Day    Types: Cigars   Smokeless tobacco: Former    Types: Chew    Quit date: 12/24/1991  Substance and Sexual Activity   Alcohol use: No    Alcohol/week: 0.0 standard drinks of alcohol   Drug use: No   Sexual activity: Yes    Partners: Female  Other Topics Concern   Not on file  Social History Narrative   Married, lives with wife, both children and grandchildren   Social Drivers of Health    Financial Resource Strain: Low Risk  (07/24/2023)   Overall Financial Resource Strain (CARDIA)    Difficulty of Paying Living Expenses: Not very hard  Food Insecurity: No Food Insecurity (07/24/2023)   Hunger Vital Sign    Worried About Running Out of Food in the Last Year: Never true    Ran Out of Food in the Last Year: Never true  Transportation Needs: No Transportation Needs (07/24/2023)   PRAPARE - Administrator, Civil Service (Medical): No    Lack of Transportation (Non-Medical): No  Physical Activity: Inactive (07/24/2023)   Exercise Vital Sign    Days of Exercise per Week: 0 days    Minutes of Exercise per Session: 0 min  Stress: Stress Concern Present (07/24/2023)   [employer] of Occupational Health - Occupational Stress Questionnaire    Feeling of Stress : Very much  Social Connections: Moderately Isolated (07/24/2023)   Social Connection and Isolation Panel [NHANES]    Frequency of Communication with Friends and Family: Twice a week    Frequency of Social Gatherings with Friends and Family: Once a week    Attends Religious Services: Never  Active Member of Clubs or Organizations: No    Attends Banker Meetings: Never    Marital Status: Married   Family History  Problem Relation Age of Onset   Coronary artery disease Mother    Cancer Father        Melanoma and Testicular   Diabetes Father    Allergies  Allergen Reactions   Morphine Itching   Prior to Admission medications   Medication Sig Start Date End Date Taking? Authorizing Provider  albuterol (VENTOLIN HFA) 108 (90 Base) MCG/ACT inhaler Inhale 2 puffs into the lungs every 6 (six) hours as needed for wheezing or shortness of breath. 01/06/23  Yes Enedina Finner, MD  allopurinol (ZYLOPRIM) 100 MG tablet Take 1 tablet (100 mg total) by mouth 2 (two) times daily. 11/26/23  Yes Sowles, Danna Hefty, MD  busPIRone (BUSPAR) 5 MG tablet TAKE 1 TO 2 TABLETS BY MOUTH THREE TIMES DAILY 11/26/23  Yes Sowles,  Danna Hefty, MD  cyclobenzaprine (FLEXERIL) 10 MG tablet Take 1 tablet (10 mg total) by mouth at bedtime. TAKE 1 TABLET BY MOUTH AT BEDTIME 11/26/23  Yes Sowles, Danna Hefty, MD  DULoxetine (CYMBALTA) 60 MG capsule Take 1 capsule (60 mg total) by mouth daily. 11/26/23  Yes Sowles, Danna Hefty, MD  levothyroxine (SYNTHROID) 88 MCG tablet Take 1 tablet (88 mcg total) by mouth daily before breakfast. 11/21/23  Yes Sowles, Danna Hefty, MD  Magnesium Oxide 500 MG TABS Take 1 tablet by mouth once daily 06/03/22  Yes Sowles, Danna Hefty, MD  metFORMIN (GLUCOPHAGE-XR) 750 MG 24 hr tablet Take 1 tablet (750 mg total) by mouth daily with breakfast. 11/26/23  Yes Sowles, Danna Hefty, MD  pregabalin (LYRICA) 225 MG capsule Take 1 capsule (225 mg total) by mouth 2 (two) times daily. 11/26/23  Yes Sowles, Danna Hefty, MD  QUEtiapine (SEROQUEL) 25 MG tablet Take 1 tablet (25 mg total) by mouth at bedtime. 11/26/23  Yes Sowles, Danna Hefty, MD  rosuvastatin (CRESTOR) 5 MG tablet Take 1 tablet (5 mg total) by mouth daily. 11/26/23  Yes Sowles, Danna Hefty, MD  traZODone (DESYREL) 100 MG tablet Take 2 tablets (200 mg total) by mouth at bedtime. TAKE 1 & 1/2 (ONE & ONE-HALF) TABLETS BY MOUTH AT BEDTIME 11/26/23  Yes Sowles, Danna Hefty, MD  ALLERGY RELIEF 180 MG tablet TAKE 1 TABLET(180 MG) BY MOUTH DAILY 11/14/21   Carlynn Purl, Danna Hefty, MD  fluticasone (FLONASE) 50 MCG/ACT nasal spray Place 2 sprays into both nostrils daily. 11/12/22   Alba Cory, MD  sildenafil (VIAGRA) 100 MG tablet TAKE 1 TABLET(100 MG) BY MOUTH DAILY AS NEEDED FOR ERECTILE DYSFUNCTION 07/06/23   Michiel Cowboy A, PA-C  testosterone cypionate (DEPOTESTOSTERONE CYPIONATE) 200 MG/ML injection ADMINISTER 1 ML IN THE MUSCLE EVERY 14 DAYS 07/06/23   McGowan, Carollee Herter A, PA-C  tirzepatide [facility]) 10 MG/0.5ML Pen Inject 10 mg into the skin once a week. 11/26/23   Alba Cory, MD     Positive ROS: All other systems have been reviewed and were otherwise negative with the exception of those  mentioned in the HPI and as above.  Physical Exam: General: Alert, no acute distress Cardiovascular: No pedal edema Respiratory: No cyanosis, no use of accessory musculature GI: No organomegaly, abdomen is soft and non-tender Skin: No lesions in the area of chief complaint Neurologic: Sensation intact distally Psychiatric: Patient is competent for consent with normal mood and affect Lymphatic: No axillary or cervical lymphadenopathy  MUSCULOSKELETAL: On exam right shoulder has pain over the [facility] joint, 0-100 degrees at best.  He is weak with  cuff testing.    MRI demonstrates a full thickness cuff tear of the supraspinatus.   Assessment: Right shoulder full thickness cuff tear   Plan: Plan for Procedure(s): SHOULDER ARTHROSCOPY WITH ROTATOR CUFF REPAIR AND SUBACROMIAL DECOMPRESSION SHOULDER ARTHROSCOPY WITH DISTAL CLAVICLE EXCISION ARTHROSCOPY SHOULDER/DEBRIDEMENT  The risks benefits and alternatives were discussed with the patient including but not limited to the risks of nonoperative treatment, versus surgical intervention including infection, bleeding, nerve injury,  blood clots, cardiopulmonary complications, morbidity, mortality, among others, and they were willing to proceed.     Armida Sans, PA-C    03/08/2024 9:29 AM

## 2024-03-09 ENCOUNTER — Ambulatory Visit (HOSPITAL_BASED_OUTPATIENT_CLINIC_OR_DEPARTMENT_OTHER): Admitting: Anesthesiology

## 2024-03-09 ENCOUNTER — Ambulatory Visit (HOSPITAL_BASED_OUTPATIENT_CLINIC_OR_DEPARTMENT_OTHER)
Admission: RE | Admit: 2024-03-09 | Discharge: 2024-03-09 | Disposition: A | Discharge status: Home / Self Care | Payer: Medicare Other | Source: Ambulatory Visit | Attending: Orthopedic Surgery | Admitting: Orthopedic Surgery

## 2024-03-09 ENCOUNTER — Encounter (HOSPITAL_BASED_OUTPATIENT_CLINIC_OR_DEPARTMENT_OTHER)
Admission: RE | Disposition: A | Discharge status: Home / Self Care | Payer: Self-pay | Source: Ambulatory Visit | Attending: Orthopedic Surgery

## 2024-03-09 ENCOUNTER — Emergency Department
Admission: EM | Admit: 2024-03-09 | Discharge: 2024-03-09 | Disposition: A | Discharge status: Home / Self Care | Attending: Emergency Medicine | Admitting: Emergency Medicine

## 2024-03-09 ENCOUNTER — Encounter (HOSPITAL_BASED_OUTPATIENT_CLINIC_OR_DEPARTMENT_OTHER): Payer: Self-pay | Admitting: Orthopedic Surgery

## 2024-03-09 ENCOUNTER — Other Ambulatory Visit: Payer: Self-pay

## 2024-03-09 ENCOUNTER — Encounter: Payer: Self-pay | Admitting: Emergency Medicine

## 2024-03-09 DIAGNOSIS — E039 Hypothyroidism, unspecified: Secondary | ICD-10-CM | POA: Diagnosis not present

## 2024-03-09 DIAGNOSIS — Z79890 Hormone replacement therapy (postmenopausal): Secondary | ICD-10-CM | POA: Diagnosis not present

## 2024-03-09 DIAGNOSIS — Z6836 Body mass index (BMI) 36.0-36.9, adult: Secondary | ICD-10-CM | POA: Insufficient documentation

## 2024-03-09 DIAGNOSIS — M7541 Impingement syndrome of right shoulder: Secondary | ICD-10-CM | POA: Insufficient documentation

## 2024-03-09 DIAGNOSIS — Z833 Family history of diabetes mellitus: Secondary | ICD-10-CM | POA: Insufficient documentation

## 2024-03-09 DIAGNOSIS — M75121 Complete rotator cuff tear or rupture of right shoulder, not specified as traumatic: Secondary | ICD-10-CM | POA: Diagnosis not present

## 2024-03-09 DIAGNOSIS — F419 Anxiety disorder, unspecified: Secondary | ICD-10-CM | POA: Insufficient documentation

## 2024-03-09 DIAGNOSIS — I10 Essential (primary) hypertension: Secondary | ICD-10-CM | POA: Diagnosis not present

## 2024-03-09 DIAGNOSIS — Z7984 Long term (current) use of oral hypoglycemic drugs: Secondary | ICD-10-CM | POA: Diagnosis not present

## 2024-03-09 DIAGNOSIS — Z7985 Long-term (current) use of injectable non-insulin antidiabetic drugs: Secondary | ICD-10-CM | POA: Insufficient documentation

## 2024-03-09 DIAGNOSIS — S46011A Strain of muscle(s) and tendon(s) of the rotator cuff of right shoulder, initial encounter: Secondary | ICD-10-CM | POA: Diagnosis not present

## 2024-03-09 DIAGNOSIS — R600 Localized edema: Secondary | ICD-10-CM | POA: Diagnosis present

## 2024-03-09 DIAGNOSIS — E119 Type 2 diabetes mellitus without complications: Secondary | ICD-10-CM | POA: Insufficient documentation

## 2024-03-09 DIAGNOSIS — K449 Diaphragmatic hernia without obstruction or gangrene: Secondary | ICD-10-CM | POA: Diagnosis not present

## 2024-03-09 DIAGNOSIS — E1142 Type 2 diabetes mellitus with diabetic polyneuropathy: Secondary | ICD-10-CM

## 2024-03-09 DIAGNOSIS — L03011 Cellulitis of right finger: Secondary | ICD-10-CM | POA: Insufficient documentation

## 2024-03-09 DIAGNOSIS — F17290 Nicotine dependence, other tobacco product, uncomplicated: Secondary | ICD-10-CM | POA: Insufficient documentation

## 2024-03-09 DIAGNOSIS — E669 Obesity, unspecified: Secondary | ICD-10-CM | POA: Insufficient documentation

## 2024-03-09 DIAGNOSIS — Z79899 Other long term (current) drug therapy: Secondary | ICD-10-CM | POA: Diagnosis not present

## 2024-03-09 DIAGNOSIS — G8929 Other chronic pain: Secondary | ICD-10-CM

## 2024-03-09 DIAGNOSIS — G4733 Obstructive sleep apnea (adult) (pediatric): Secondary | ICD-10-CM | POA: Insufficient documentation

## 2024-03-09 DIAGNOSIS — F17210 Nicotine dependence, cigarettes, uncomplicated: Secondary | ICD-10-CM | POA: Diagnosis not present

## 2024-03-09 DIAGNOSIS — M19011 Primary osteoarthritis, right shoulder: Secondary | ICD-10-CM | POA: Insufficient documentation

## 2024-03-09 DIAGNOSIS — F32A Depression, unspecified: Secondary | ICD-10-CM | POA: Insufficient documentation

## 2024-03-09 HISTORY — PX: SHOULDER ARTHROSCOPY WITH ROTATOR CUFF REPAIR AND SUBACROMIAL DECOMPRESSION: SHX5686

## 2024-03-09 HISTORY — PX: SHOULDER ARTHROSCOPY: SHX128

## 2024-03-09 HISTORY — PX: SHOULDER ARTHROSCOPY WITH DISTAL CLAVICLE RESECTION: SHX5675

## 2024-03-09 HISTORY — DX: Depression, unspecified: F32.A

## 2024-03-09 HISTORY — DX: Anxiety disorder, unspecified: F41.9

## 2024-03-09 LAB — CBC WITH DIFFERENTIAL/PLATELET
Abs Immature Granulocytes: 0.03 10*3/uL (ref 0.00–0.07)
Basophils Absolute: 0 10*3/uL (ref 0.0–0.1)
Basophils Relative: 0 %
Eosinophils Absolute: 0 10*3/uL (ref 0.0–0.5)
Eosinophils Relative: 0 %
HCT: 39.8 % (ref 39.0–52.0)
Hemoglobin: 12.8 g/dL — ABNORMAL LOW (ref 13.0–17.0)
Immature Granulocytes: 0 %
Lymphocytes Relative: 3 %
Lymphs Abs: 0.3 10*3/uL — ABNORMAL LOW (ref 0.7–4.0)
MCH: 28.8 pg (ref 26.0–34.0)
MCHC: 32.2 g/dL (ref 30.0–36.0)
MCV: 89.6 fL (ref 80.0–100.0)
Monocytes Absolute: 0.1 10*3/uL (ref 0.1–1.0)
Monocytes Relative: 1 %
Neutro Abs: 8 10*3/uL — ABNORMAL HIGH (ref 1.7–7.7)
Neutrophils Relative %: 96 %
Platelets: 170 10*3/uL (ref 150–400)
RBC: 4.44 MIL/uL (ref 4.22–5.81)
RDW: 13.5 % (ref 11.5–15.5)
WBC: 8.4 10*3/uL (ref 4.0–10.5)
nRBC: 0 % (ref 0.0–0.2)

## 2024-03-09 LAB — GLUCOSE, CAPILLARY
Glucose-Capillary: 87 mg/dL (ref 70–99)
Glucose-Capillary: 136 mg/dL — ABNORMAL HIGH (ref 70–99)

## 2024-03-09 LAB — BASIC METABOLIC PANEL
Anion gap: 9 (ref 5–15)
BUN: 15 mg/dL (ref 6–20)
CO2: 23 mmol/L (ref 22–32)
Calcium: 8.5 mg/dL — ABNORMAL LOW (ref 8.9–10.3)
Chloride: 104 mmol/L (ref 98–111)
Creatinine, Ser: 1.27 mg/dL — ABNORMAL HIGH (ref 0.61–1.24)
GFR, Estimated: >60 mL/min (ref >60)
Glucose, Bld: 211 mg/dL — ABNORMAL HIGH (ref 70–99)
Potassium: 4.3 mmol/L (ref 3.5–5.1)
Sodium: 136 mmol/L (ref 135–145)

## 2024-03-09 SURGERY — SHOULDER ARTHROSCOPY WITH ROTATOR CUFF REPAIR AND SUBACROMIAL DECOMPRESSION
Anesthesia: Regional | Site: Shoulder | Laterality: Right

## 2024-03-09 MED ORDER — OXYCODONE HCL 5 MG PO TABS: 5.0000 mg | ORAL_TABLET | ORAL | 0 refills | Status: DC | PRN

## 2024-03-09 MED ORDER — FENTANYL CITRATE (PF) 100 MCG/2ML IJ SOLN: 25.0000 ug | INTRAMUSCULAR | Status: DC | PRN

## 2024-03-09 MED ORDER — GLYCOPYRROLATE PF 0.2 MG/ML IJ SOSY
PREFILLED_SYRINGE | INTRAMUSCULAR | Status: AC
  Filled 2024-03-09: qty 1

## 2024-03-09 MED ORDER — PROPOFOL 10 MG/ML IV BOLUS
INTRAVENOUS | Status: DC | PRN
  Administered 2024-03-09: 150 ug via INTRAVENOUS

## 2024-03-09 MED ORDER — PHENYLEPHRINE HCL-NACL 20-0.9 MG/250ML-% IV SOLN
INTRAVENOUS | Status: DC | PRN
  Administered 2024-03-09 (×2): 160 ug via INTRAVENOUS
  Administered 2024-03-09: 30 ug/min via INTRAVENOUS
  Administered 2024-03-09: 160 ug via INTRAVENOUS

## 2024-03-09 MED ORDER — KETOROLAC TROMETHAMINE 30 MG/ML IJ SOLN
INTRAMUSCULAR | Status: AC
  Filled 2024-03-09: qty 1

## 2024-03-09 MED ORDER — SUGAMMADEX SODIUM 200 MG/2ML IV SOLN
INTRAVENOUS | Status: DC | PRN
  Administered 2024-03-09: 400 mg via INTRAVENOUS

## 2024-03-09 MED ORDER — GLYCOPYRROLATE PF 0.2 MG/ML IJ SOSY
PREFILLED_SYRINGE | INTRAMUSCULAR | Status: DC | PRN
Start: 2024-03-09 — End: 2024-03-09
  Administered 2024-03-09: .2 mg via INTRAVENOUS

## 2024-03-09 MED ORDER — EPHEDRINE SULFATE-NACL 50-0.9 MG/10ML-% IV SOSY
PREFILLED_SYRINGE | INTRAVENOUS | Status: DC | PRN
Start: 2024-03-09 — End: 2024-03-09
  Administered 2024-03-09 (×3): 10 mg via INTRAVENOUS

## 2024-03-09 MED ORDER — ACETAMINOPHEN 10 MG/ML IV SOLN: 1000.0000 mg | Freq: Once | INTRAVENOUS | Status: DC | PRN

## 2024-03-09 MED ORDER — LIDOCAINE HCL (PF) 1 % IJ SOLN
5.0000 mL | Freq: Once | INTRAMUSCULAR | Status: AC
  Administered 2024-03-09: 5 mL
  Filled 2024-03-09: qty 5

## 2024-03-09 MED ORDER — LIDOCAINE 2% (20 MG/ML) 5 ML SYRINGE
INTRAMUSCULAR | Status: DC | PRN
  Administered 2024-03-09: 40 mg via INTRAVENOUS

## 2024-03-09 MED ORDER — ONDANSETRON HCL 4 MG/2ML IJ SOLN
INTRAMUSCULAR | Status: AC
  Filled 2024-03-09: qty 2

## 2024-03-09 MED ORDER — BUPIVACAINE LIPOSOME 1.3 % IJ SUSP
INTRAMUSCULAR | Status: AC
  Filled 2024-03-09: qty 10

## 2024-03-09 MED ORDER — ACETAMINOPHEN 500 MG PO TABS
1000.0000 mg | ORAL_TABLET | Freq: Once | ORAL | Status: AC
  Administered 2024-03-09: 1000 mg via ORAL

## 2024-03-09 MED ORDER — ROCURONIUM BROMIDE 10 MG/ML (PF) SYRINGE
PREFILLED_SYRINGE | INTRAVENOUS | Status: AC
  Filled 2024-03-09: qty 10

## 2024-03-09 MED ORDER — ONDANSETRON HCL 4 MG/2ML IJ SOLN
INTRAMUSCULAR | Status: DC | PRN
  Administered 2024-03-09: 4 mg via INTRAVENOUS

## 2024-03-09 MED ORDER — MIDAZOLAM HCL 2 MG/2ML IJ SOLN: 2.0000 mg | Freq: Once | INTRAMUSCULAR | Status: DC

## 2024-03-09 MED ORDER — LACTATED RINGERS IV SOLN: INTRAVENOUS | Status: DC

## 2024-03-09 MED ORDER — KETOROLAC TROMETHAMINE 30 MG/ML IJ SOLN
INTRAMUSCULAR | Status: DC | PRN
Start: 2024-03-09 — End: 2024-03-09
  Administered 2024-03-09: 30 mg via INTRAVENOUS

## 2024-03-09 MED ORDER — PHENYLEPHRINE 80 MCG/ML (10ML) SYRINGE FOR IV PUSH (FOR BLOOD PRESSURE SUPPORT)
PREFILLED_SYRINGE | INTRAVENOUS | Status: AC
  Filled 2024-03-09: qty 10

## 2024-03-09 MED ORDER — DEXAMETHASONE SODIUM PHOSPHATE 10 MG/ML IJ SOLN
INTRAMUSCULAR | Status: AC
  Filled 2024-03-09: qty 1

## 2024-03-09 MED ORDER — EPHEDRINE 5 MG/ML INJ
INTRAVENOUS | Status: AC
  Filled 2024-03-09: qty 10

## 2024-03-09 MED ORDER — LIDOCAINE 2% (20 MG/ML) 5 ML SYRINGE
INTRAMUSCULAR | Status: AC
Start: 2024-03-09 — End: ?
  Filled 2024-03-09: qty 5

## 2024-03-09 MED ORDER — CEFAZOLIN SODIUM-DEXTROSE 2-4 GM/100ML-% IV SOLN
2.0000 g | INTRAVENOUS | Status: AC
  Administered 2024-03-09: 2 g via INTRAVENOUS

## 2024-03-09 MED ORDER — SODIUM CHLORIDE 0.9 % IR SOLN
Status: DC | PRN
  Administered 2024-03-09: 12000

## 2024-03-09 MED ORDER — FENTANYL CITRATE (PF) 100 MCG/2ML IJ SOLN
INTRAMUSCULAR | Status: AC
  Filled 2024-03-09: qty 2

## 2024-03-09 MED ORDER — BUPIVACAINE LIPOSOME 1.3 % IJ SUSP
INTRAMUSCULAR | Status: DC | PRN
  Administered 2024-03-09: 10 mL

## 2024-03-09 MED ORDER — POVIDONE-IODINE 10 % EX SWAB: 2.0000 | Freq: Once | CUTANEOUS | Status: DC

## 2024-03-09 MED ORDER — DEXAMETHASONE SODIUM PHOSPHATE 10 MG/ML IJ SOLN
INTRAMUSCULAR | Status: DC | PRN
  Administered 2024-03-09: 8 mg via INTRAVENOUS

## 2024-03-09 MED ORDER — ACETAMINOPHEN 500 MG PO TABS
ORAL_TABLET | ORAL | Status: AC
  Filled 2024-03-09: qty 2

## 2024-03-09 MED ORDER — DROPERIDOL 2.5 MG/ML IJ SOLN: 0.6250 mg | Freq: Once | INTRAMUSCULAR | Status: DC | PRN

## 2024-03-09 MED ORDER — CYCLOBENZAPRINE HCL 10 MG PO TABS: 10.0000 mg | ORAL_TABLET | Freq: Three times a day (TID) | ORAL | 1 refills | Status: DC | PRN

## 2024-03-09 MED ORDER — ROCURONIUM 10MG/ML (10ML) SYRINGE FOR MEDFUSION PUMP - OPTIME
INTRAVENOUS | Status: DC | PRN
  Administered 2024-03-09: 70 mg via INTRAVENOUS

## 2024-03-09 MED ORDER — ONDANSETRON HCL 4 MG PO TABS: 4.0000 mg | ORAL_TABLET | Freq: Three times a day (TID) | ORAL | 0 refills | Status: DC | PRN

## 2024-03-09 MED ORDER — OXYCODONE HCL 5 MG/5ML PO SOLN: 5.0000 mg | Freq: Once | ORAL | Status: DC | PRN

## 2024-03-09 MED ORDER — FENTANYL CITRATE (PF) 100 MCG/2ML IJ SOLN
100.0000 ug | Freq: Once | INTRAMUSCULAR | Status: AC
  Administered 2024-03-09: 50 ug via INTRAVENOUS

## 2024-03-09 MED ORDER — CEPHALEXIN 500 MG PO CAPS: 500.0000 mg | ORAL_CAPSULE | Freq: Three times a day (TID) | ORAL | 0 refills | Status: AC

## 2024-03-09 MED ORDER — MIDAZOLAM HCL 2 MG/2ML IJ SOLN
INTRAMUSCULAR | Status: AC
  Filled 2024-03-09: qty 2

## 2024-03-09 MED ORDER — CEFAZOLIN SODIUM-DEXTROSE 2-4 GM/100ML-% IV SOLN
INTRAVENOUS | Status: AC
  Filled 2024-03-09: qty 100

## 2024-03-09 MED ORDER — BUPIVACAINE HCL (PF) 0.5 % IJ SOLN
INTRAMUSCULAR | Status: DC | PRN
  Administered 2024-03-09: 10 mL

## 2024-03-09 MED ORDER — CEPHALEXIN 500 MG PO CAPS
500.0000 mg | ORAL_CAPSULE | Freq: Once | ORAL | Status: AC
  Administered 2024-03-09: 500 mg via ORAL
  Filled 2024-03-09: qty 1

## 2024-03-09 MED ORDER — OXYCODONE HCL 5 MG PO TABS: 5.0000 mg | ORAL_TABLET | Freq: Once | ORAL | Status: DC | PRN

## 2024-03-09 SURGICAL SUPPLY — 51 items
ANCHOR SUT BIO SW 4.75X19.1 (Anchor) IMPLANT
BLADE EXCALIBUR 4.0X13 (MISCELLANEOUS) IMPLANT
BURR OVAL 8 FLU 4.0X13 (MISCELLANEOUS) ×1 IMPLANT
BURR OVAL 8 FLU 5.0X13 (MISCELLANEOUS) IMPLANT
CANNULA 5.75X71 LONG (CANNULA) ×1 IMPLANT
CANNULA TWIST IN 8.25X7CM (CANNULA) IMPLANT
CLSR STERI-STRIP ANTIMIC 1/2X4 (GAUZE/BANDAGES/DRESSINGS) ×1 IMPLANT
COOLER ICEMAN CLASSIC (MISCELLANEOUS) IMPLANT
DISSECTOR 3.8MM X 13CM (MISCELLANEOUS) ×1 IMPLANT
DRAPE IMP U-DRAPE 54X76 (DRAPES) ×1 IMPLANT
DRAPE INCISE IOBAN 66X45 STRL (DRAPES) ×1 IMPLANT
DRAPE POUCH INSTRU U-SHP 10X18 (DRAPES) ×1 IMPLANT
DRAPE SHOULDER BEACH CHAIR (DRAPES) ×1 IMPLANT
DRAPE SURG 17X23 STRL (DRAPES) ×1 IMPLANT
DRAPE U-SHAPE 47X51 STRL (DRAPES) ×1 IMPLANT
DURAPREP 26ML APPLICATOR (WOUND CARE) ×1 IMPLANT
FIBERSTICK 2 (SUTURE) IMPLANT
GAUZE PAD ABD 8X10 STRL (GAUZE/BANDAGES/DRESSINGS) ×1 IMPLANT
GAUZE SPONGE 4X4 12PLY STRL (GAUZE/BANDAGES/DRESSINGS) ×1 IMPLANT
GLOVE BIO SURGEON STRL SZ7 (GLOVE) ×1 IMPLANT
GLOVE BIOGEL PI IND STRL 7.0 (GLOVE) ×1 IMPLANT
GLOVE BIOGEL PI IND STRL 8 (GLOVE) ×2 IMPLANT
GLOVE ORTHO TXT STRL SZ7.5 (GLOVE) ×1 IMPLANT
GOWN STRL REUS W/ TWL LRG LVL3 (GOWN DISPOSABLE) ×2 IMPLANT
GOWN STRL REUS W/ TWL XL LVL3 (GOWN DISPOSABLE) ×2 IMPLANT
LASSO 90 CVE QUICKPAS (DISPOSABLE) IMPLANT
MANIFOLD NEPTUNE II (INSTRUMENTS) ×1 IMPLANT
NDL HD SCORPION MEGA LOADER (NEEDLE) IMPLANT
PACK ARTHROSCOPY DSU (CUSTOM PROCEDURE TRAY) ×1 IMPLANT
PACK BASIN DAY SURGERY FS (CUSTOM PROCEDURE TRAY) ×1 IMPLANT
PAD COLD SHLDR WRAP-ON (PAD) IMPLANT
RESTRAINT HEAD UNIVERSAL NS (MISCELLANEOUS) ×1 IMPLANT
SHEET MEDIUM DRAPE 40X70 STRL (DRAPES) ×1 IMPLANT
SLEEVE SCD COMPRESS KNEE MED (STOCKING) ×1 IMPLANT
SLING ARM FOAM STRAP LRG (SOFTGOODS) IMPLANT
SLING ARM IMMOBILIZER XL (CAST SUPPLIES) IMPLANT
SOL .9 NS 3000ML IRR UROMATIC (IV SOLUTION) ×3 IMPLANT
SPIKE FLUID TRANSFER (MISCELLANEOUS) IMPLANT
SUPPORT WRAP ARM LG (MISCELLANEOUS) ×1 IMPLANT
SUT FIBERWIRE #2 38 T-5 BLUE (SUTURE) IMPLANT
SUT MNCRL AB 4-0 PS2 18 (SUTURE) ×1 IMPLANT
SUT PDS AB 1 CT 36 (SUTURE) IMPLANT
SUT TIGER TAPE 7 IN WHITE (SUTURE) IMPLANT
SUT VIC AB 3-0 SH 27X BRD (SUTURE) IMPLANT
SUTURE FIBERWR #2 38 T-5 BLUE (SUTURE) IMPLANT
TAPE FIBER 2MM 7IN #2 BLUE (SUTURE) IMPLANT
TOWEL GREEN STERILE FF (TOWEL DISPOSABLE) ×1 IMPLANT
TUBE CONNECTING 20X1/4 (TUBING) ×1 IMPLANT
TUBING ARTHROSCOPY IRRIG 16FT (MISCELLANEOUS) ×1 IMPLANT
WAND ABLATOR APOLLO I90 (BUR) ×1 IMPLANT
WATER STERILE IRR 1000ML POUR (IV SOLUTION) ×1 IMPLANT

## 2024-03-09 NOTE — Discharge Instructions (Signed)
 We have successfully drained your fingertip infection (felon).  Wound culture is pending at this time.  Take the prescription antibiotic as directed.  Keep the wound clean, dry, and covered.  You may remove the fingertip packing in 3 days.  Return to the ED for any new concern of worsening infection.

## 2024-03-09 NOTE — ED Provider Notes (Signed)
 [facility] Emergency Department Provider Note     Event Date/Time   First MD Initiated Contact with Patient 03/09/24 1642     (approximate)   History   Finger Swelling   HPI  [name] is a 53 y.o. male returns to the ED for ongoing swelling to the right pinky.  Patient initially reported to the ED after he accidentally pinched his fat pad with a pair of pliers.  His x-ray from 3 days prior, showed no acute tuft fracture.  He was discharged with a splint instructed to follow-up with primary provider.  The patient had an outpatient shoulder arthroscopy on the right, and noted ongoing pain and swelling to the right pinky.  He presents to the ED reporting recollection of purulent material to the fat pad.  He initially attempted to drain the abscess of the finger fat pad before he presented to the ED initially.  He denies any interim fevers, chills, or sweats.  Physical Exam   Triage Vital Signs: ED Triage Vitals [03/09/24 1440]  Encounter Vitals Group     BP 122/88     Systolic BP Percentile      Diastolic BP Percentile      Pulse Rate 68     Resp 18     Temp 97.6 F (36.4 C)     Temp Source Oral     SpO2 98 %     Weight 274 lb (124.3 kg)     Height 5\' 11"  (1.803 m)     Head Circumference      Peak Flow      Pain Score 4     Pain Loc      Pain Education      Exclude from Growth Chart     Most recent vital signs: Vitals:   03/09/24 1440  BP: 122/88  Pulse: 68  Resp: 18  Temp: 97.6 F (36.4 C)  SpO2: 98%    General Awake, no distress. NAD HEENT NCAT. PERRL. EOMI. No rhinorrhea. Mucous membranes are moist.  CV:  Good peripheral perfusion. RRR. No  CCe distally RESP:  Normal effort.  ABD:  No distention.  MSK:  Right shoulder with sling in place with dressing to the shoulder joint consistent with recent arthroscopy.  The right pinky with a bulbous appearing distal fat pad with evidence of purulence to the fat pad.  Some proximal  finger edema is appreciated.  Range of motion is limited by soft tissue swelling. NEURO: Normal gross sensation.  ED Results / Procedures / Treatments   Labs (all labs ordered are listed, but only abnormal results are displayed) Labs Reviewed  CBC WITH DIFFERENTIAL/PLATELET - Abnormal; Notable for the following components:      Result Value   Hemoglobin 12.8 (*)    Neutro Abs 8.0 (*)    Lymphs Abs 0.3 (*)    All other components within normal limits  BASIC METABOLIC PANEL - Abnormal; Notable for the following components:   Glucose, Bld 211 (*)    Creatinine, Ser 1.27 (*)    Calcium 8.5 (*)    All other components within normal limits  AEROBIC/ANAEROBIC CULTURE W GRAM STAIN (SURGICAL/DEEP WOUND)     EKG   RADIOLOGY  No results found.   PROCEDURES:  Critical Care performed: No  .Incision and Drainage  Date/Time: 03/09/2024 6:40 PM  Performed by: Lissa Hoard, PA-C Authorized by: Lissa Hoard, PA-C   Consent:  Consent obtained:  Verbal   Consent given by:  Patient   Risks, benefits, and alternatives were discussed: yes     Risks discussed:  Bleeding, pain and incomplete drainage   Alternatives discussed:  Alternative treatment Universal protocol:    Imaging studies available: yes     Site/side marked: yes     Immediately prior to procedure, a time out was called: yes     Patient identity confirmed:  Verbally with patient Location:    Indications for incision and drainage: felon.   Size:  2   Location:  Upper extremity   Upper extremity location:  Finger   Finger location:  R small finger Pre-procedure details:    Skin preparation:  Povidone-iodine Sedation:    Sedation type:  None Anesthesia:    Anesthesia method:  Nerve block   Block location:  Flexor tendon   Block needle gauge:  27 G   Block anesthetic:  Lidocaine 1% w/o epi   Block technique:  Flexor tendon   Block injection procedure:  Anatomic landmarks identified, anatomic  landmarks palpated, introduced needle and incremental injection   Block outcome:  Anesthesia achieved Procedure type:    Complexity:  Simple Procedure details:    Ultrasound guidance: no     Needle aspiration: no     Incision types:  Single straight   Incision depth:  Subcutaneous   Wound management:  Probed and deloculated and irrigated with saline   Drainage:  Purulent   Drainage amount:  Moderate   Wound treatment:  Wound left open   Packing materials:  1/4 in iodoform gauze   Amount 1/4" iodoform:  1 Post-procedure details:    Procedure completion:  Tolerated well, no immediate complications    MEDICATIONS ORDERED IN ED: Medications  lidocaine (PF) (XYLOCAINE) 1 % injection 5 mL (5 mLs Infiltration Given 03/09/24 1730)  cephALEXin (KEFLEX) capsule 500 mg (500 mg Oral Given 03/09/24 1730)     IMPRESSION / MDM / ASSESSMENT AND PLAN / ED COURSE  I reviewed the triage vital signs and the nursing notes.                              Differential diagnosis includes, but is not limited to, felon, subungual hemorrhage, paronychia, cellulitis  Patient's presentation is most consistent with acute presentation with potential threat to life or bodily function.  Patient's diagnosis is consistent with felon of the right pinky finger.  Patient consents to I&D procedure which is performed with good expression of purulent material.  Iodoform packing is placed, the finger is dressed with sterile dressing.  Wound culture is pending at this time.  Patient will be discharged home with prescriptions for Keflex.  Patient may remove the fingertip packing in 3 days time.  Patient is to follow up with his primary provider as discussed, as needed or otherwise directed. Patient is given ED precautions to return to the ED for any worsening or new symptoms.   FINAL CLINICAL IMPRESSION(S) / ED DIAGNOSES   Final diagnoses:  Felon of finger of right hand     Rx / DC Orders   ED Discharge Orders           Ordered    cephALEXin (KEFLEX) 500 MG capsule  3 times daily        03/09/24 1726             Note:  This document was prepared using Dragon  voice recognition software and may include unintentional dictation errors.    Lissa Hoard, PA-C 03/09/24 [place]    Chesley Noon, MD 03/09/24 2358

## 2024-03-09 NOTE — Anesthesia Preprocedure Evaluation (Addendum)
 Anesthesia Evaluation  Patient identified by MRN, date of birth, ID band Patient awake    Reviewed: Allergy & Precautions, H&P , NPO status , Patient's Chart, lab work & pertinent test results  Airway Mallampati: II  TM Distance: >3 FB Neck ROM: Full    Dental  (+) Edentulous Lower, Edentulous Upper, Poor Dentition   Pulmonary sleep apnea , Current Smoker and Patient abstained from smoking.   Pulmonary exam normal breath sounds clear to auscultation       Cardiovascular hypertension, (-) angina Normal cardiovascular exam Rhythm:Regular Rate:Normal     Neuro/Psych neg Seizures PSYCHIATRIC DISORDERS Anxiety Depression    negative neurological ROS     GI/Hepatic Neg liver ROS, hiatal hernia,,,Hx of gastric bypass   Endo/Other  diabetesHypothyroidism    Renal/GU negative Renal ROS  negative genitourinary   Musculoskeletal  (+) Arthritis ,    Abdominal   Peds negative pediatric ROS (+)  Hematology negative hematology ROS (+)   Anesthesia Other Findings   Reproductive/Obstetrics negative OB ROS                             Anesthesia Physical Anesthesia Plan  ASA: 3  Anesthesia Plan: General and Regional   Post-op Pain Management: Regional block* and Tylenol PO (pre-op)*   Induction: Intravenous  PONV Risk Score and Plan: 2 and Ondansetron and Dexamethasone  Airway Management Planned: Oral ETT  Additional Equipment:   Intra-op Plan:   Post-operative Plan: Extubation in OR  Informed Consent: I have reviewed the patients History and Physical, chart, labs and discussed the procedure including the risks, benefits and alternatives for the proposed anesthesia with the patient or authorized representative who has indicated his/her understanding and acceptance.     Dental advisory given  Plan Discussed with: CRNA  Anesthesia Plan Comments:        Anesthesia Quick Evaluation

## 2024-03-09 NOTE — ED Notes (Signed)
 Report off to emilee schaffer rn

## 2024-03-09 NOTE — Interval H&P Note (Signed)
 History and Physical Interval Note:  03/09/2024 8:39 AM  [name]  has presented today for surgery, with the diagnosis of right shoulder cartilage disorder, impingement syndrome, rotator cuff tear, OSTEOARTHRITIS.  The various methods of treatment have been discussed with the patient and family. After consideration of risks, benefits and other options for treatment, the patient has consented to  Procedure(s): SHOULDER ARTHROSCOPY WITH ROTATOR CUFF REPAIR AND SUBACROMIAL DECOMPRESSION (Right) SHOULDER ARTHROSCOPY WITH DISTAL CLAVICLE EXCISION (Right) ARTHROSCOPY SHOULDER/DEBRIDEMENT (Right) as a surgical intervention.  The patient's history has been reviewed, patient examined, no change in status, stable for surgery.  I have reviewed the patient's chart and labs.  Questions were answered to the patient's satisfaction.     Eulas Post

## 2024-03-09 NOTE — Anesthesia Procedure Notes (Signed)
 Procedure Name: Intubation Date/Time: 03/09/2024 8:55 AM  Performed by: Yolanda Bonine, CRNAPre-anesthesia Checklist: Patient identified, Emergency Drugs available, Suction available and Patient being monitored Patient Re-evaluated:Patient Re-evaluated prior to induction Oxygen Delivery Method: Circle system utilized Preoxygenation: Pre-oxygenation with 100% oxygen Induction Type: IV induction Ventilation: Mask ventilation without difficulty Laryngoscope Size: Mac and 3 Grade View: Grade II Tube type: Oral Tube size: 7.0 mm Number of attempts: 1 Airway Equipment and Method: Stylet Placement Confirmation: ETT inserted through vocal cords under direct vision, positive ETCO2 and breath sounds checked- equal and bilateral Tube secured with: Tape Dental Injury: Teeth and Oropharynx as per pre-operative assessment

## 2024-03-09 NOTE — Anesthesia Procedure Notes (Signed)
 Anesthesia Regional Block: Interscalene brachial plexus block   Pre-Anesthetic Checklist: , timeout performed,  Correct Patient, Correct Site, Correct Laterality,  Correct Procedure, Correct Position, site marked,  Risks and benefits discussed,  Surgical consent,  Pre-op evaluation,  At surgeon's request and post-op pain management  Laterality: Right  Prep: chloraprep       Needles:  Injection technique: Single-shot  Needle Type: Echogenic Stimulator Needle     Needle Length: 9cm  Needle Gauge: 21     Additional Needles:   Procedures:,,,, ultrasound used (permanent image in chart),,    Narrative:  Start time: 03/09/2024 8:00 AM End time: 03/09/2024 8:10 AM Injection made incrementally with aspirations every 5 mL.  Performed by: Personally  Anesthesiologist: [place] Nation, MD  Additional Notes: Discussed risks and benefits of the nerve block in detail, including but not limited vascular injury, permanent nerve damage and infection.   Patient tolerated the procedure well. Local anesthetic introduced in an incremental fashion under minimal resistance after negative aspirations. No paresthesias were elicited. After completion of the procedure, no acute issues were identified and patient continued to be monitored by RN.

## 2024-03-09 NOTE — Transfer of Care (Signed)
 Immediate Anesthesia Transfer of Care Note  Patient: [name]  Procedure(s) Performed: SHOULDER ARTHROSCOPY WITH ROTATOR CUFF REPAIR AND SUBACROMIAL DECOMPRESSION (Right: Shoulder) SHOULDER ARTHROSCOPY WITH DISTAL CLAVICLE EXCISION (Right: Shoulder) ARTHROSCOPY SHOULDER/DEBRIDEMENT (Right: Shoulder)  Patient Location: PACU  Anesthesia Type:GA combined with regional for post-op pain  Level of Consciousness: drowsy and patient cooperative  Airway & Oxygen Therapy: Patient Spontanous Breathing and Patient connected to face mask oxygen  Post-op Assessment: Report given to RN and Post -op Vital signs reviewed and stable  Post vital signs: Reviewed and stable  Last Vitals:  Vitals Value Taken Time  BP 161/81 03/09/24 1108  Temp    Pulse 91 03/09/24 1113  Resp 12 03/09/24 1113  SpO2 100 % 03/09/24 1113  Vitals shown include unfiled device data.  Last Pain:  Vitals:   03/09/24 0808  TempSrc:   PainSc: 0-No pain         Complications: No notable events documented.

## 2024-03-09 NOTE — ED Triage Notes (Signed)
 Patient to ED via POV for finger swelling to right pinky. States seen for same on Saturday- sent home in splint. Finger very large and swollen. Had rotator cuff surgery this AM on right shoulder.   Had nerve block this AM for surgery.

## 2024-03-09 NOTE — Progress Notes (Signed)
 Assisted Dr. Lyn Henri with right, interscalene , ultrasound guided block. Side rails up, monitors on throughout procedure. See vital signs in flow sheet. Tolerated Procedure well.

## 2024-03-09 NOTE — Discharge Instructions (Addendum)
 Shoulder Arthroscopy Rotator Cuff Repair Post-Operative Instructions  Diet: Start with some clear liquids, soups, etc, and advance to your regular diet as tolerated.   Dressing:  You may remove your dressing 3-5 days after surgery and shower.  There are steri-strips (white strips) over the incisions.  Your stitches are absorbable.  Leave the steri-strips in place when changing your dressings, they will peel off with time, usually 2-3 weeks.  Keep your wounds covered with band-aids/gauze until your first post-op appointment.  Activity:  Keep the sling on at all times except for hygiene.  It is OK to begin with exercises moving your elbow up and down, and working your hand, wrist, and fingers to keep them limber, but DO NOT try lifting your arm or rotating it out, as this will put dangerous stress on the repair.  You cannot drive while taking narcotic medications or while you are in the sling.    Weight Bearing:   Do not lift your arm.  You can get dressed by leaning forward and letting the arm dangle and sliding your shirt sleeve over the operative arm first, without having to lift the arm.    Medications:  You will want to take some of your pain medications tonight before going to bed to make sure you have something in your system when the numbing medicine/block wears off.  The maximum dose of Tylenol/Acetaminophen in a day is 3,000-4,000 mg, and beware that your pain medication may have Tylenol (acetaminophen) in it.  As your pain improves, you can begin to taper the amount of narcotic you are using. You may want to avoid using ibuprofen/motrin/NSAIDs for the first 4-6 weeks, as they can slow down tendon and bone healing.    To prevent constipation: you may use a stool softener such as -  Colace (over the counter) 100 mg by mouth twice a day  Drink plenty of fluids (prune juice may be helpful) and high fiber foods Miralax (over the counter) for constipation as needed.    Itching:  If you  experience itching or other side effects with your pain medications, try taking only a single pain pill, or even half a pain pill at a time.  You can also use Benadryl for itching or also to help with sleep.   Precautions:  If you experience chest pain or shortness of breath - call 911 immediately for transfer to the hospital emergency department!!  If you develop a fever greater that 101 F, purulent drainage from wound, increased redness or drainage from wound, or calf pain -- Call the office at [phone]                                                 Follow- Up Appointment:  Please call for an appointment to be seen in 2 weeks [phone] in [place].  After-Hours:  We have an Urgent Care available for after-hours emergencies located at the [facility] office at [facility] in [place] open from 5:30p-9p every night, and from 10a-2p on Saturday and Sunday.  There is also an on-call physician 24-7 for emergencies that can be reached at [phone]. Post Anesthesia Home Care Instructions  Activity: Get plenty of rest for the remainder of the day. A responsible individual must stay with you for 24 hours following the procedure.  For the next 24  hours, DO NOT: -Drive a car -Advertising copywriter -Drink alcoholic beverages -Take any medication unless instructed by your physician -Make any legal decisions or sign important papers.  Meals: Start with liquid foods such as gelatin or soup. Progress to regular foods as tolerated. Avoid greasy, spicy, heavy foods. If nausea and/or vomiting occur, drink only clear liquids until the nausea and/or vomiting subsides. Call your physician if vomiting continues.  Special Instructions/Symptoms: Your throat may feel dry or sore from the anesthesia or the breathing tube placed in your throat during surgery. If this causes discomfort, gargle with warm salt water. The discomfort should disappear within 24 hours.  If you had a scopolamine  patch placed behind your ear for the management of post- operative nausea and/or vomiting:  1. The medication in the patch is effective for 72 hours, after which it should be removed.  Wrap patch in a tissue and discard in the trash. Wash hands thoroughly with soap and water. 2. You may remove the patch earlier than 72 hours if you experience unpleasant side effects which may include dry mouth, dizziness or visual disturbances. 3. Avoid touching the patch. Wash your hands with soap and water after contact with the patch.      Regional Anesthesia Blocks  1. You may not be able to move or feel the "blocked" extremity after a regional anesthetic block. This may last may last from 3-48 hours after placement, but it will go away. The length of time depends on the medication injected and your individual response to the medication. As the nerves start to wake up, you may experience tingling as the movement and feeling returns to your extremity. If the numbness and inability to move your extremity has not gone away after 48 hours, please call your surgeon.   2. The extremity that is blocked will need to be protected until the numbness is gone and the strength has returned. Because you cannot feel it, you will need to take extra care to avoid injury. Because it may be weak, you may have difficulty moving it or using it. You may not know what position it is in without looking at it while the block is in effect.  3. For blocks in the legs and feet, returning to weight bearing and walking needs to be done carefully. You will need to wait until the numbness is entirely gone and the strength has returned. You should be able to move your leg and foot normally before you try and bear weight or walk. You will need someone to be with you when you first try to ensure you do not fall and possibly risk injury.  4. Bruising and tenderness at the needle site are common side effects and will resolve in a few days.  5.  Persistent numbness or new problems with movement should be communicated to the surgeon or the [facility] [phone] [facility] [phone]).    Next dose of Tylenol may be taken at 2pInformation for Discharge Teaching: EXPAREL (bupivacaine liposome injectable suspension)   Pain relief is important to your recovery. The goal is to control your pain so you can move easier and return to your normal activities as soon as possible after your procedure. Your physician may use several types of medicines to manage pain, swelling, and more.  Your surgeon or anesthesiologist gave you EXPAREL(bupivacaine) to help control your pain after surgery.  EXPAREL is a local anesthetic designed to release slowly over an extended period of  time to provide pain relief by numbing the tissue around the surgical site. EXPAREL is designed to release pain medication over time and can control pain for up to 72 hours. Depending on how you respond to EXPAREL, you may require less pain medication during your recovery. EXPAREL can help reduce or eliminate the need for opioids during the first few days after surgery when pain relief is needed the most. EXPAREL is not an opioid and is not addictive. It does not cause sleepiness or sedation.   Important! A teal colored band has been placed on your arm with the date, time and amount of EXPAREL you have received. Please leave this armband in place for the full 96 hours following administration, and then you may remove the band. If you return to the hospital for any reason within 96 hours following the administration of EXPAREL, the armband provides important information that your health care providers to know, and alerts them that you have received this anesthetic.    Possible side effects of EXPAREL: Temporary loss of sensation or ability to move in the area where medication was injected. Nausea, vomiting, constipation Rarely, numbness and  tingling in your mouth or lips, lightheadedness, or anxiety may occur. Call your doctor right away if you think you may be experiencing any of these sensations, or if you have other questions regarding possible side effects.  Follow all other discharge instructions given to you by your surgeon or nurse. Eat a healthy diet and drink plenty of water or other fluids.

## 2024-03-09 NOTE — ED Provider Triage Note (Signed)
 Emergency Medicine Provider Triage Evaluation Note  [name] , a 53 y.o. male  was evaluated in triage.  Pt complains of right 5th finger swelling.  Seen in ED 3/15 after a crush injury that occurred 2 days prior.   Patient also had rotor cuff surgery today in [place].    Review of Systems  Positive: Swollen, painful   + DM Negative: No fever.  Physical Exam  There were no vitals taken for this visit. Gen:   Awake, no distress   Resp:  Normal effort  MSK:   Moves extremities without difficulty.   Right 5th finger with soft tissue swelling and mild erythema paronychial area. Other:    Medical Decision Making  Medically screening exam initiated at 2:39 PM.  Appropriate orders placed.  [name] was informed that the remainder of the evaluation will be completed by another provider, this initial triage assessment does not replace that evaluation, and the importance of remaining in the ED until their evaluation is complete.     Tommi Rumps, PA-C 03/09/24 1444

## 2024-03-09 NOTE — Op Note (Signed)
 03/09/2024  10:47 AM  PATIENT:  [name]    PRE-OPERATIVE DIAGNOSIS: Right shoulder impingement syndrome, AC joint arthrosis, supraspinatus tear, acute on chronic  POST-OPERATIVE DIAGNOSIS:  Same  PROCEDURE: Right shoulder arthroscopy, with extensive debridement of anterior labrum, subscapularis, subdeltoid bursa, with acromioplasty, distal clavicle resection, and arthroscopic rotator cuff repair  SURGEON:  Eulas Post, MD  PHYSICIAN ASSISTANT: Janine Ores, PA-C, present and scrubbed throughout the case, critical for completion in a timely fashion, and for retraction, instrumentation, and closure.  ANESTHESIA:   General with regional block  PREOPERATIVE INDICATIONS:  [name] is a  53 y.o. male with significant shoulder pain who failed conservative measures and elected for surgical management.    The risks benefits and alternatives were discussed with the patient preoperatively including but not limited to the risks of infection, bleeding, nerve injury, cardiopulmonary complications, the need for revision surgery, among others, and the patient was willing to proceed.  ESTIMATED BLOOD LOSS: Minimal  OPERATIVE IMPLANTS: Arthrex Biocomposite SwiveLock 4.75 x 1 for the medial row preloaded with fibertape, and I used the rescue suture as well as an additional fiber link to minimize dogear, with 1 lateral Biocomposite SwiveLock 4.75 mm anchors for the lateral row.  OPERATIVE FINDINGS: He had full motion during examination under anesthesia.  The glenohumeral articular cartilage was normal.  He had a full-thickness supraspinatus tear and the leading edge that was about 1 x 1 cm, with a substantial amount of redundant tissue.  The bone quality was mediocre at best.  He had moderate subacromial spurring with CA ligament fraying, and the [facility] joint had significant arthrosis.  The subscapularis had some upper border fraying, but the biceps tendon was intact and the pulley was  intact.  The subscapularis moved as a single unit.   OPERATIVE PROCEDURE: The patient was brought to the operating room and placed in the supine position.  General anesthesia was administered and the patient positioned in a beach chair position.  IV antibiotics were given.  The upper extremity was prepped and draped in the usual sterile fashion.  Timeout performed.  Diagnostic arthroscopy was carried out with the above named findings.    The glenoid labrum was debrided anteriorly and superiorly.    I debrided the upper border the subscapularis.  I debrided the undersurface of the cuff as well as prepared the medial edge of the tuberosity for reimplantation using the shaver.  I went to the subacromial space, performed a complete bursectomy, subacromial CA ligament release, with a acromioplasty.  I evaluated the tear from viewing posterolaterally, used the shaver from laterally, debrided the tear as well as the bony footprint, and prepared the tendon for reinsertion.  I placed a single medial anchor from above using a percutaneous portal for placement for the medial row preloaded with fiber tape in each anchor.  I passed the sutures using a scorpion suture passer from front to back, and then brought these into a lateral anchor.  Excellent fixation and reduction of the tendon was achieved.  I touched up the acromioplasty viewing from lateral portal.  I used the arthroscopic bur to resect 1 cm of distal clavicle, and confirmed this from the anterior portal.  The instruments were removed, the portals closed with Monocryl followed by Steri-Strips and sterile gauze.  The patient was awakened and returned to the PACU in stable and satisfactory condition.  There were no complications and he tolerated the procedure well.

## 2024-03-09 NOTE — Anesthesia Postprocedure Evaluation (Signed)
 Anesthesia Post Note  Patient: [name]  Procedure(s) Performed: SHOULDER ARTHROSCOPY WITH ROTATOR CUFF REPAIR AND SUBACROMIAL DECOMPRESSION (Right: Shoulder) SHOULDER ARTHROSCOPY WITH DISTAL CLAVICLE EXCISION (Right: Shoulder) ARTHROSCOPY SHOULDER/DEBRIDEMENT (Right: Shoulder)     Patient location during evaluation: PACU Anesthesia Type: Regional and General Level of consciousness: awake and alert Pain management: pain level controlled Vital Signs Assessment: post-procedure vital signs reviewed and stable Respiratory status: spontaneous breathing, nonlabored ventilation, respiratory function stable and patient connected to nasal cannula oxygen Cardiovascular status: blood pressure returned to baseline and stable Postop Assessment: no apparent nausea or vomiting Anesthetic complications: no   No notable events documented.  Last Vitals:  Vitals:   03/09/24 1200 03/09/24 1220  BP: (!) 159/96 126/82  Pulse: 91 85  Resp: 13 14  Temp:  (!) 36.2 C  SpO2: 94% 94%    Last Pain:  Vitals:   03/09/24 1220  TempSrc: Oral  PainSc: 0-No pain                 [place]

## 2024-03-10 ENCOUNTER — Encounter (HOSPITAL_BASED_OUTPATIENT_CLINIC_OR_DEPARTMENT_OTHER): Payer: Self-pay | Admitting: Orthopedic Surgery

## 2024-03-14 LAB — AEROBIC/ANAEROBIC CULTURE W GRAM STAIN (SURGICAL/DEEP WOUND)

## 2024-03-15 NOTE — Consult Note (Signed)
 ED Antimicrobial Stewardship Positive Culture Follow Up   53 y.o. male presented to [facility] on 03/09/2024 with a chief complaint of  Chief Complaint  Patient presents with   Finger Swelling  .  Recent Results (from the past 720 hours)  Aerobic/Anaerobic Culture w Gram Stain (surgical/deep wound)     Status: None   Collection Time: 03/09/24  7:06 PM   Specimen: Abscess  Result Value Ref Range Status   Specimen Description   Final    ABSCESS Performed at [facility], [address] [place], [place] [address]    Special Requests   Final    right pinky Performed at [facility], [address] [place], [place] [address]    Gram Stain   Final    FEW WBC PRESENT, PREDOMINANTLY MONONUCLEAR FEW GRAM POSITIVE COCCI IN CLUSTERS    Culture   Final    ABUNDANT METHICILLIN RESISTANT STAPHYLOCOCCUS AUREUS NO ANAEROBES ISOLATED Performed at [facility], [address] [place], [place] [address]    Report Status 03/14/2024 FINAL  Final   Organism ID, Bacteria METHICILLIN RESISTANT STAPHYLOCOCCUS AUREUS  Final      Susceptibility   Methicillin resistant staphylococcus aureus - MIC*    CIPROFLOXACIN >=8 RESISTANT Resistant     ERYTHROMYCIN >=8 RESISTANT Resistant     GENTAMICIN <=0.5 SENSITIVE Sensitive     OXACILLIN >=4 RESISTANT Resistant     TETRACYCLINE <=1 SENSITIVE Sensitive     VANCOMYCIN 1 SENSITIVE Sensitive     TRIMETH/SULFA <=10 SENSITIVE Sensitive     CLINDAMYCIN <=0.25 SENSITIVE Sensitive     RIFAMPIN <=0.5 SENSITIVE Sensitive     Inducible Clindamycin NEGATIVE Sensitive     LINEZOLID 2 SENSITIVE Sensitive     * ABUNDANT METHICILLIN RESISTANT STAPHYLOCOCCUS AUREUS     ASSESSMENT: [x]  Treated with cephelaxin, organism resistant to prescribed antimicrobial []  Patient discharged originally without antimicrobial agent and treatment is now indicated []  Patient discharged without a prescription and, although the culture is positive, issuance  of an antimicrobial prescription was not appropriate per review with follow-up ED provider   PLAN: [x]  New antibiotic prescription(s): Bactrim DS 1 tab PO twice daily x 7 days []  No new antibiotic prescription(s)  ED provider consulted: Dr. Lenard Lance   Will M. Dareen Piano, PharmD Clinical Pharmacist 03/15/2024 4:43 PM

## 2024-03-17 ENCOUNTER — Encounter: Payer: Self-pay | Admitting: Family Medicine

## 2024-03-17 ENCOUNTER — Ambulatory Visit (INDEPENDENT_AMBULATORY_CARE_PROVIDER_SITE_OTHER): Admitting: Family Medicine

## 2024-03-17 VITALS — BP 134/86 | HR 91 | Resp 16 | Ht 71.0 in | Wt 271.0 lb

## 2024-03-17 DIAGNOSIS — E1169 Type 2 diabetes mellitus with other specified complication: Secondary | ICD-10-CM

## 2024-03-17 DIAGNOSIS — L03011 Cellulitis of right finger: Secondary | ICD-10-CM

## 2024-03-17 DIAGNOSIS — Z7985 Long-term (current) use of injectable non-insulin antidiabetic drugs: Secondary | ICD-10-CM

## 2024-03-17 DIAGNOSIS — E785 Hyperlipidemia, unspecified: Secondary | ICD-10-CM

## 2024-03-17 DIAGNOSIS — Z9889 Other specified postprocedural states: Secondary | ICD-10-CM

## 2024-03-17 MED ORDER — BLOOD GLUCOSE MONITORING SUPPL DEVI: 1.0000 | Freq: Every day | 0 refills | Status: DC | PRN

## 2024-03-17 MED ORDER — BLOOD GLUCOSE TEST VI STRP: 1.0000 | ORAL_STRIP | Freq: Every day | 0 refills | Status: DC

## 2024-03-17 MED ORDER — LANCET DEVICE MISC: 1.0000 | Freq: Three times a day (TID) | 1 refills | Status: AC

## 2024-03-17 MED ORDER — LANCETS MISC. MISC: 1.0000 | Freq: Three times a day (TID) | 0 refills | Status: AC

## 2024-03-17 NOTE — Progress Notes (Signed)
 Name: [name]   MRN: [id number]    DOB: July 08, 1971   Date:03/17/2024       Progress Note  Subjective  Chief Complaint  Chief Complaint  Patient presents with   Hospitalization Follow-up    Discussed the use of AI scribe software for clinical note transcription with the patient, who gave verbal consent to proceed.  History of Present Illness [name] "[name]" is a 53 year old male who presents with a finger infection.  He has a significant infection in the right fifth finger, which began after a pinch injury with pliers on March 03, 2024. The following day, the finger became swollen with visible pus, which he attempted to drain at home. Despite this, the swelling worsened, leading to an emergency room visit on March 06, 2024, where an x-ray was performed, and a crush injury was diagnosed. No fracture or bone infection was noted, and he was sent home without intervention. On March 09, 2024, after his shoulder surgery, he visited the hospital again due to the finger infection. The finger was opened for drainage under a local block, and he was initially prescribed Keflex 500 mg. However, the bacteria was resistant, and he was switched to [place], which he started on March 16, 2024. The finger is still swollen and oozing, with numbness   He underwent right shoulder surgery on March 09, 2024, for a full-thickness rotator cuff tear of the supraspinatus. He has persistent pain in the right shoulder post-surgery.  Diabetes type II, which he reports is well-controlled despite not regularly checking his blood sugar at home due to a lack of supplies. He is currently on Mounjaro 12.5 mg, which was paused for the surgery but is back on regiment since. No significant changes in his blood sugar levels during surgery   He reports claustrophobia related to wearing a seatbelt, previously provided with a letter exempting him from seatbelt use, however explain I don't feel comfortable doing that  but documentation is in the chart for future reference    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   SBO (small bowel obstruction) (HCC) 12/20/2023   History of arthroscopic surgery of shoulder 11/26/2023   Chronic pain 06/17/2023   Polypharmacy 06/17/2023   Anxiety and depression 01/04/2023   Type 2 diabetes mellitus with peripheral neuropathy (HCC) 01/04/2023   B12 deficiency 06/11/2016   Allergic conjunctivitis 06/11/2016   Muscle cramps 06/11/2016   Dyslipidemia 11/02/2015   Acquired nystagmus 08/01/2015   Chronic bilateral low back pain with bilateral sciatica 06/29/2015   Carpal tunnel syndrome 06/29/2015   Osteoarthritis 06/29/2015   Claustrophobia 06/29/2015   Foot drop, left 06/29/2015   Hiatal hernia 06/29/2015   H/O testicular cancer 06/29/2015   Acquired hypothyroidism 06/29/2015   Allergic rhinitis 06/29/2015   Seborrheic keratoses 06/29/2015   Vitamin D deficiency 06/29/2015    Social History   Tobacco Use   Smoking status: Every Day    Types: Cigars   Smokeless tobacco: Former    Types: Chew    Quit date: 12/24/1991  Substance Use Topics   Alcohol use: No    Alcohol/week: 0.0 standard drinks of alcohol     Current Outpatient Medications:    albuterol (VENTOLIN HFA) 108 (90 Base) MCG/ACT inhaler, Inhale 2 puffs into the lungs every 6 (six) hours as needed for wheezing or shortness of breath., Disp: 8 g, Rfl: 2   ALLERGY RELIEF 180 MG tablet, TAKE 1 TABLET(180  MG) BY MOUTH DAILY, Disp: 90 tablet, Rfl: 0   allopurinol (ZYLOPRIM) 100 MG tablet, Take 1 tablet (100 mg total) by mouth 2 (two) times daily., Disp: 180 tablet, Rfl: 1   busPIRone (BUSPAR) 5 MG tablet, TAKE 1 TO 2 TABLETS BY MOUTH THREE TIMES DAILY, Disp: 180 tablet, Rfl: 1   cyclobenzaprine (FLEXERIL) 10 MG tablet, Take 1 tablet (10 mg total) by mouth 3 (three) times daily as needed for muscle spasms. TAKE 1 TABLET BY MOUTH AT BEDTIME, Disp: 90 tablet, Rfl:  1   DULoxetine (CYMBALTA) 60 MG capsule, Take 1 capsule (60 mg total) by mouth daily., Disp: 90 capsule, Rfl: 1   fluticasone (FLONASE) 50 MCG/ACT nasal spray, Place 2 sprays into both nostrils daily., Disp: 16 g, Rfl: 6   levothyroxine (SYNTHROID) 88 MCG tablet, Take 1 tablet (88 mcg total) by mouth daily before breakfast., Disp: 30 tablet, Rfl: 0   Magnesium Oxide 500 MG TABS, Take 1 tablet by mouth once daily, Disp: 90 tablet, Rfl: 0   metFORMIN (GLUCOPHAGE-XR) 750 MG 24 hr tablet, Take 1 tablet (750 mg total) by mouth daily with breakfast., Disp: 90 tablet, Rfl: 1   MOUNJARO 12.5 MG/0.5ML Pen, Inject 12.5 mg into the skin once a week., Disp: , Rfl:    ondansetron (ZOFRAN) 4 MG tablet, Take 1 tablet (4 mg total) by mouth every 8 (eight) hours as needed for nausea or vomiting., Disp: 10 tablet, Rfl: 0   oxyCODONE (ROXICODONE) 5 MG immediate release tablet, Take 1 tablet (5 mg total) by mouth every 4 (four) hours as needed for severe pain (pain score 7-10)., Disp: 30 tablet, Rfl: 0   pregabalin (LYRICA) 225 MG capsule, Take 1 capsule (225 mg total) by mouth 2 (two) times daily., Disp: 180 capsule, Rfl: 1   QUEtiapine (SEROQUEL) 25 MG tablet, Take 1 tablet (25 mg total) by mouth at bedtime., Disp: 90 tablet, Rfl: 1   rosuvastatin (CRESTOR) 5 MG tablet, Take 1 tablet (5 mg total) by mouth daily., Disp: 90 tablet, Rfl: 1   sildenafil (VIAGRA) 100 MG tablet, TAKE 1 TABLET(100 MG) BY MOUTH DAILY AS NEEDED FOR ERECTILE DYSFUNCTION, Disp: 30 tablet, Rfl: 0   sulfamethoxazole-trimethoprim (BACTRIM DS) 800-160 MG tablet, Take 1 tablet by mouth 2 (two) times daily., Disp: , Rfl:    testosterone cypionate (DEPOTESTOSTERONE CYPIONATE) 200 MG/ML injection, ADMINISTER 1 ML IN THE MUSCLE EVERY 14 DAYS, Disp: 10 mL, Rfl: 0   traZODone (DESYREL) 100 MG tablet, Take 2 tablets (200 mg total) by mouth at bedtime. TAKE 1 & 1/2 (ONE & ONE-HALF) TABLETS BY MOUTH AT BEDTIME, Disp: 180 tablet, Rfl: 1   tirzepatide  (MOUNJARO) 10 MG/0.5ML Pen, Inject 10 mg into the skin once a week. (Patient not taking: Reported on 03/17/2024), Disp: 6 mL, Rfl: 1  Allergies  Allergen Reactions   Morphine Itching    ROS  Ten systems reviewed and is negative except as mentioned in HPI    Objective  Vitals:   03/17/24 1003  BP: 134/86  Pulse: 91  Resp: 16  SpO2: 98%  Weight: 271 lb (122.9 kg)  Height: 5\' 11"  (1.803 m)    Body mass index is 37.8 kg/m.    Physical Exam   Constitutional: Patient appears well-developed and well-nourished. Obese No distress.  HEENT: head atraumatic, normocephalic, pupils equal and reactive to light, neck supple Cardiovascular: Normal rate, regular rhythm and normal heart sounds.  No murmur heard. No BLE edema. Pulmonary/Chest: Effort normal and breath sounds normal.  No respiratory distress. Abdominal: Soft.  There is no tenderness. Skin: opened wound on right 5 th finger . Some oozing. Tender to touch, swelling present  Muscular skeletal: wearing a sling on right shoulder  Psychiatric: Patient has a normal mood and affect. behavior is normal. Judgment and thought content normal.   Recent Results (from the past 2160 hours)  CBC with Differential     Status: Abnormal   Collection Time: 12/19/23  6:13 PM  Result Value Ref Range   WBC 12.6 (H) 4.0 - 10.5 K/uL   RBC 5.64 4.22 - 5.81 MIL/uL   Hemoglobin 16.7 13.0 - 17.0 g/dL   HCT 16.1 09.6 - 04.5 %   MCV 87.9 80.0 - 100.0 fL   MCH 29.6 26.0 - 34.0 pg   MCHC 33.7 30.0 - 36.0 g/dL   RDW 40.9 81.1 - 91.4 %   Platelets 246 150 - 400 K/uL   nRBC 0.0 0.0 - 0.2 %   Neutrophils Relative % 90 %   Neutro Abs 11.3 (H) 1.7 - 7.7 K/uL   Lymphocytes Relative 7 %   Lymphs Abs 0.9 0.7 - 4.0 K/uL   Monocytes Relative 2 %   Monocytes Absolute 0.3 0.1 - 1.0 K/uL   Eosinophils Relative 0 %   Eosinophils Absolute 0.0 0.0 - 0.5 K/uL   Basophils Relative 0 %   Basophils Absolute 0.0 0.0 - 0.1 K/uL   Immature Granulocytes 1 %   Abs  Immature Granulocytes 0.07 0.00 - 0.07 K/uL    Comment: Performed at [facility], [address], [place], [place] [address]  Basic metabolic panel     Status: Abnormal   Collection Time: 12/19/23  6:13 PM  Result Value Ref Range   Sodium 139 135 - 145 mmol/L   Potassium 4.6 3.5 - 5.1 mmol/L   Chloride 98 98 - 111 mmol/L   CO2 27 22 - 32 mmol/L   Glucose, Bld 173 (H) 70 - 99 mg/dL    Comment: Glucose reference range applies only to samples taken after fasting for at least 8 hours.   BUN 24 (H) 6 - 20 mg/dL   Creatinine, Ser 6.21 (H) 0.61 - 1.24 mg/dL   Calcium 30.8 8.9 - 65.7 mg/dL   GFR, Estimated >84 >69 mL/min    Comment: (NOTE) Calculated using the CKD-EPI Creatinine Equation (2021)    Anion gap 14 5 - 15    Comment: Performed at [facility], [address] [place], [place] [address]  Hepatic function panel     Status: Abnormal   Collection Time: 12/19/23  6:13 PM  Result Value Ref Range   Total Protein 8.4 (H) 6.5 - 8.1 g/dL   Albumin 5.0 3.5 - 5.0 g/dL   AST 25 15 - 41 U/L   ALT 19 0 - 44 U/L   Alkaline Phosphatase 129 (H) 38 - 126 U/L   Total Bilirubin 1.2 (H) <1.2 mg/dL   Bilirubin, Direct 0.2 0.0 - 0.2 mg/dL   Indirect Bilirubin 1.0 (H) 0.3 - 0.9 mg/dL    Comment: Performed at [facility], [address] [place], [place] [address]  Lipase, blood     Status: None   Collection Time: 12/19/23  6:13 PM  Result Value Ref Range   Lipase 33 11 - 51 U/L    Comment: Performed at [facility], [address] [place], [place] [address]  Lactic acid, plasma     Status: None   Collection Time:  12/20/23  2:30 AM  Result Value Ref Range   Lactic Acid, Venous 1.5 0.5 - 1.9 mmol/L    Comment: Performed at [facility], [address] [place], [place] [address]  Basic metabolic panel     Status: Abnormal   Collection Time: 03/05/24  9:00 AM  Result Value Ref Range   Sodium 136 135 - 145 mmol/L   Potassium 4.5 3.5 - 5.1  mmol/L   Chloride 101 98 - 111 mmol/L   CO2 31 22 - 32 mmol/L   Glucose, Bld 157 (H) 70 - 99 mg/dL    Comment: Glucose reference range applies only to samples taken after fasting for at least 8 hours.   BUN 11 6 - 20 mg/dL   Creatinine, Ser 6.44 0.61 - 1.24 mg/dL   Calcium 9.2 8.9 - 03.4 mg/dL   GFR, Estimated >74 >25 mL/min    Comment: (NOTE) Calculated using the CKD-EPI Creatinine Equation (2021)    Anion gap 4 (L) 5 - 15    Comment: Performed at [facility], [address], [place], [place] [address]  Glucose, capillary     Status: None   Collection Time: 03/09/24  7:58 AM  Result Value Ref Range   Glucose-Capillary 87 70 - 99 mg/dL    Comment: Glucose reference range applies only to samples taken after fasting for at least 8 hours.   Comment 1 Notify RN    Comment 2 Document in Chart   Glucose, capillary     Status: Abnormal   Collection Time: 03/09/24 11:25 AM  Result Value Ref Range   Glucose-Capillary 136 (H) 70 - 99 mg/dL    Comment: Glucose reference range applies only to samples taken after fasting for at least 8 hours.  CBC with Differential     Status: Abnormal   Collection Time: 03/09/24  2:42 PM  Result Value Ref Range   WBC 8.4 4.0 - 10.5 K/uL   RBC 4.44 4.22 - 5.81 MIL/uL   Hemoglobin 12.8 (L) 13.0 - 17.0 g/dL   HCT 75.6 43.3 - 29.5 %   MCV 89.6 80.0 - 100.0 fL   MCH 28.8 26.0 - 34.0 pg   MCHC 32.2 30.0 - 36.0 g/dL   RDW 18.8 41.6 - 60.6 %   Platelets 170 150 - 400 K/uL   nRBC 0.0 0.0 - 0.2 %   Neutrophils Relative % 96 %   Neutro Abs 8.0 (H) 1.7 - 7.7 K/uL   Lymphocytes Relative 3 %   Lymphs Abs 0.3 (L) 0.7 - 4.0 K/uL   Monocytes Relative 1 %   Monocytes Absolute 0.1 0.1 - 1.0 K/uL   Eosinophils Relative 0 %   Eosinophils Absolute 0.0 0.0 - 0.5 K/uL   Basophils Relative 0 %   Basophils Absolute 0.0 0.0 - 0.1 K/uL   Immature Granulocytes 0 %   Abs Immature Granulocytes 0.03 0.00 - 0.07 K/uL    Comment: Performed at [facility], [address] [place], [place] [address]  Basic metabolic panel     Status: Abnormal   Collection Time: 03/09/24  2:42 PM  Result Value Ref Range   Sodium 136 135 - 145 mmol/L   Potassium 4.3 3.5 - 5.1 mmol/L   Chloride 104 98 - 111 mmol/L   CO2 23 22 - 32 mmol/L   Glucose, Bld 211 (H) 70 - 99 mg/dL    Comment: Glucose reference range applies only to samples taken after fasting for at  least 8 hours.   BUN 15 6 - 20 mg/dL   Creatinine, Ser 4.09 (H) 0.61 - 1.24 mg/dL   Calcium 8.5 (L) 8.9 - 10.3 mg/dL   GFR, Estimated >81 >19 mL/min    Comment: (NOTE) Calculated using the CKD-EPI Creatinine Equation (2021)    Anion gap 9 5 - 15    Comment: Performed at [facility], [address] [place], [place] [address]  Aerobic/Anaerobic Culture w Gram Stain (surgical/deep wound)     Status: None   Collection Time: 03/09/24  7:06 PM   Specimen: Abscess  Result Value Ref Range   Specimen Description      ABSCESS Performed at [facility], [address], [place], [place] [address]    Special Requests      right pinky Performed at [facility], [address] [place], [place] [address]    Gram Stain      FEW WBC PRESENT, PREDOMINANTLY MONONUCLEAR FEW GRAM POSITIVE COCCI IN CLUSTERS    Culture      ABUNDANT METHICILLIN RESISTANT STAPHYLOCOCCUS AUREUS NO ANAEROBES ISOLATED Performed at [facility], [address] [place], [place] [address]    Report Status 03/14/2024 FINAL    Organism ID, Bacteria METHICILLIN RESISTANT STAPHYLOCOCCUS AUREUS       Susceptibility   Methicillin resistant staphylococcus aureus - MIC*    CIPROFLOXACIN >=8 RESISTANT Resistant     ERYTHROMYCIN >=8 RESISTANT Resistant     GENTAMICIN <=0.5 SENSITIVE Sensitive     OXACILLIN >=4 RESISTANT Resistant     TETRACYCLINE <=1 SENSITIVE Sensitive     VANCOMYCIN 1 SENSITIVE Sensitive     TRIMETH/SULFA <=10 SENSITIVE Sensitive     CLINDAMYCIN <=0.25 SENSITIVE Sensitive     RIFAMPIN  <=0.5 SENSITIVE Sensitive     Inducible Clindamycin NEGATIVE Sensitive     LINEZOLID 2 SENSITIVE Sensitive     * ABUNDANT METHICILLIN RESISTANT STAPHYLOCOCCUS AUREUS     Assessment and Plan Assessment & Plan Right fifth finger infection Crush injury led to infection with risk of osteomyelitis. Initial treatment with Keflex failed due to resistance. Switched to sulfamethoxazole/trimethoprim. Urgent orthopedic follow-up required. - Continue sulfamethoxazole/trimethoprim as prescribed. - Contact orthopedic surgeon for urgent appointment before March 26, 2024. - Advise to keep finger clean and monitor for worsening infection. - Educated on infection seriousness and osteomyelitis risk.  Post-operative care for right shoulder surgery Post-operative care complicated by concurrent finger infection, risking spread to surgical site. Immediate orthopedic follow-up advised. - Follow up with orthopedic surgeon for post-operative care. - Monitor for signs of infection at shoulder surgery site.  Type 2 Diabetes Mellitus Diabetes management affected by infection and surgery, impacting blood glucose levels. Mounjaro 12.5 mg prescribed, but home monitoring not performed due to lack of supplies. - Prescribe glucose meter and test strips for daily monitoring. - Monitor blood glucose levels daily, especially post-surgery or if unwell. - Educated on importance of monitoring blood glucose, especially with infection and surgery.  Claustrophobia related to seatbelt use Claustrophobia with seatbelt use discussed. Safety implications of not wearing a seatbelt addressed. - Discuss safety risks of not wearing a seatbelt but we will have documentation on our records that he is intolerant to the use

## 2024-03-24 ENCOUNTER — Other Ambulatory Visit: Payer: Self-pay | Admitting: Family Medicine

## 2024-03-24 DIAGNOSIS — E039 Hypothyroidism, unspecified: Secondary | ICD-10-CM

## 2024-03-26 ENCOUNTER — Encounter: Payer: Self-pay | Admitting: Family Medicine

## 2024-03-26 ENCOUNTER — Ambulatory Visit (INDEPENDENT_AMBULATORY_CARE_PROVIDER_SITE_OTHER): Payer: Self-pay | Admitting: Family Medicine

## 2024-03-26 VITALS — BP 136/84 | HR 75 | Resp 16 | Ht 71.0 in | Wt 271.0 lb

## 2024-03-26 DIAGNOSIS — M109 Gout, unspecified: Secondary | ICD-10-CM

## 2024-03-26 DIAGNOSIS — F339 Major depressive disorder, recurrent, unspecified: Secondary | ICD-10-CM

## 2024-03-26 DIAGNOSIS — E039 Hypothyroidism, unspecified: Secondary | ICD-10-CM

## 2024-03-26 DIAGNOSIS — L03011 Cellulitis of right finger: Secondary | ICD-10-CM

## 2024-03-26 DIAGNOSIS — D508 Other iron deficiency anemias: Secondary | ICD-10-CM

## 2024-03-26 DIAGNOSIS — I700 Atherosclerosis of aorta: Secondary | ICD-10-CM | POA: Diagnosis not present

## 2024-03-26 DIAGNOSIS — J3089 Other allergic rhinitis: Secondary | ICD-10-CM | POA: Diagnosis not present

## 2024-03-26 DIAGNOSIS — E1169 Type 2 diabetes mellitus with other specified complication: Secondary | ICD-10-CM | POA: Diagnosis not present

## 2024-03-26 DIAGNOSIS — E538 Deficiency of other specified B group vitamins: Secondary | ICD-10-CM

## 2024-03-26 DIAGNOSIS — E785 Hyperlipidemia, unspecified: Secondary | ICD-10-CM

## 2024-03-26 DIAGNOSIS — Z9884 Bariatric surgery status: Secondary | ICD-10-CM

## 2024-03-26 DIAGNOSIS — J302 Other seasonal allergic rhinitis: Secondary | ICD-10-CM

## 2024-03-26 LAB — POCT GLYCOSYLATED HEMOGLOBIN (HGB A1C): Hemoglobin A1C: 5.7 % — AB (ref 4.0–5.6)

## 2024-03-26 MED ORDER — FLUTICASONE PROPIONATE 50 MCG/ACT NA SUSP: 2.0000 | Freq: Every day | NASAL | 1 refills | Status: AC

## 2024-03-26 MED ORDER — LEVOTHYROXINE SODIUM 88 MCG PO TABS: 88.0000 ug | ORAL_TABLET | Freq: Every day | ORAL | 0 refills | Status: DC

## 2024-03-26 NOTE — Progress Notes (Signed)
 Name: [name]   MRN: [id number]    DOB: January 13, 1971   Date:03/26/2024       Progress Note  Subjective  Chief Complaint  Chief Complaint  Patient presents with   Medical Management of Chronic Issues   Discussed the use of AI scribe software for clinical note transcription with the patient, who gave verbal consent to proceed.  History of Present Illness [name] "[name]" is a 53 year old male with diabetes and peripheral neuropathy who presents for a regular follow-up and evaluation of an infected finger.  He initially presented with a crush injury to the fifth finger of the right hand, which led to an infection. He was previously treated with Keflex, which was resistant, and then switched to SMT-TMP that he is still taking.  He was referred for an emergency consultation with an orthopedic specialist in [place], where a procedure was performed to open the finger again. The finger is now less swollen, with some numbness ( stable ) but no significant pain.  He has a history of diabetes, currently managed with Mounjaro 12.5 mg injections and Metformin. His recent A1c is 5.7%. Blood sugar levels are in the 100s. No symptoms of hypoglycemia. He also has associated peripheral neuropathy, for which he takes duloxetine and Lyrica , and reports mild symptoms in his legs.  He has dyslipidemia and Atherosclerosis of Aorta  and is taking rosuvastatin without side effects. His LDL was previously well-controlled, but his HDL was low.  He experiences chronic low back pain, described as dull and achy, with radiation to both legs. He manages this with Tylenol , he also takes Flexeril and Lyrica  He has a history of erectile dysfunction related to diabetic neuropathy, for which he has used Viagra but reports it does not work well and has not been using it. He also has hypogonadism and was getting testosterone shots - given by Urologist. He stopped going for that also  He has a history of  obstructive sleep apnea, previously managed with CPAP, but he stopped using it after weight loss from bariatric surgery. He reports feeling rested upon waking and denies snoring or waking up tired.  He has a history of major depressive disorder and anxiety, managed with duloxetine, Seroquel, and trazodone. He is doing well emotionally. No issues with sleep or temper.  He has hypothyroidism, managed with Synthroid 88 mcg daily. He is unsure of the exact dose and will verify with his wife. No symptoms such as hair loss or changes in bowel movements.  He has a history of B12 deficiency and iron deficiency anemia, likely related to bariatric surgery. He is taking B12 shots and iron supplements. He also takes vitamin D supplements.  Morbid obesity, BMI over 35 with co-morbidities , taking GLP-1 agonist for DM, weight is stable , still drinking sweet tea. Reviewed  healthier diet with patient  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   SBO (small bowel obstruction) (HCC) 12/20/2023   History of arthroscopic surgery of shoulder 11/26/2023   Chronic pain 06/17/2023   Polypharmacy 06/17/2023   Anxiety and depression 01/04/2023   Type 2 diabetes mellitus with peripheral neuropathy (HCC) 01/04/2023   B12 deficiency 06/11/2016   Allergic conjunctivitis 06/11/2016   Muscle cramps 06/11/2016   Dyslipidemia 11/02/2015   Acquired nystagmus 08/01/2015   Chronic bilateral low back pain with bilateral sciatica 06/29/2015   Carpal tunnel syndrome 06/29/2015   Osteoarthritis 06/29/2015  Claustrophobia 06/29/2015   Foot drop, left 06/29/2015   Hiatal hernia 06/29/2015   H/O testicular cancer 06/29/2015   Acquired hypothyroidism 06/29/2015   Allergic rhinitis 06/29/2015   Seborrheic keratoses 06/29/2015   Vitamin D deficiency 06/29/2015    Past Surgical History:  Procedure Laterality Date   BACK SURGERY     X 2   HERNIA REPAIR     left side and umbilical  hernia repair   LAPAROSCOPY ABDOMEN DIAGNOSTIC  12/20/2023   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   SHOULDER ARTHROSCOPY Right 03/09/2024   Procedure: ARTHROSCOPY SHOULDER/DEBRIDEMENT;  Surgeon: Teryl Lucy, MD;  Location: [facility];  Service: Orthopedics;  Laterality: Right;   SHOULDER ARTHROSCOPY W/ ROTATOR CUFF REPAIR Left 01/02/2023   SHOULDER ARTHROSCOPY WITH DISTAL CLAVICLE RESECTION Right 03/09/2024   Procedure: SHOULDER ARTHROSCOPY WITH DISTAL CLAVICLE EXCISION;  Surgeon: Teryl Lucy, MD;  Location: [facility];  Service: Orthopedics;  Laterality: Right;   SHOULDER ARTHROSCOPY WITH ROTATOR CUFF REPAIR AND SUBACROMIAL DECOMPRESSION Right 03/09/2024   Procedure: SHOULDER ARTHROSCOPY WITH ROTATOR CUFF REPAIR AND SUBACROMIAL DECOMPRESSION;  Surgeon: Teryl Lucy, MD;  Location:  SURGERY CENTER;  Service: Orthopedics;  Laterality: Right;   URETEROTOMY Left 12/23/1998    Family History  Problem Relation Age of Onset   Coronary artery disease Mother    Cancer Father        Melanoma and Testicular   Diabetes Father     Social History   Tobacco Use   Smoking status: Every Day    Types: Cigars   Smokeless tobacco: Former    Types: Chew    Quit date: 12/24/1991  Substance Use Topics   Alcohol use: No    Alcohol/week: 0.0 standard drinks of alcohol     Current Outpatient Medications:    Accu-Chek Softclix Lancets lancets, 1 each 3 (three) times daily., Disp: , Rfl:    albuterol (VENTOLIN HFA) 108 (90 Base) MCG/ACT inhaler, Inhale 2 puffs into the lungs every 6 (six) hours as needed for wheezing or shortness of breath., Disp: 8 g, Rfl: 2   ALLERGY RELIEF 180 MG tablet, TAKE 1 TABLET(180 MG) BY MOUTH DAILY, Disp: 90 tablet, Rfl: 0   allopurinol (ZYLOPRIM) 100 MG tablet, Take 1 tablet (100 mg total) by mouth 2 (two) times daily., Disp: 180 tablet, Rfl: 1   Blood Glucose Monitoring Suppl (ACCU-CHEK GUIDE ME) w/Device KIT, as directed., Disp:  , Rfl:    busPIRone (BUSPAR) 5 MG tablet, TAKE 1 TO 2 TABLETS BY MOUTH THREE TIMES DAILY, Disp: 180 tablet, Rfl: 1   cyclobenzaprine (FLEXERIL) 10 MG tablet, Take 1 tablet (10 mg total) by mouth 3 (three) times daily as needed for muscle spasms. TAKE 1 TABLET BY MOUTH AT BEDTIME, Disp: 90 tablet, Rfl: 1   DULoxetine (CYMBALTA) 60 MG capsule, Take 1 capsule (60 mg total) by mouth daily., Disp: 90 capsule, Rfl: 1   Glucose Blood (BLOOD GLUCOSE TEST STRIPS) STRP, 1 each by Other route daily. May substitute to any manufacturer covered by patient's insurance., Disp: 100 each, Rfl: 0   Lancet Device MISC, 1 each by Does not apply route in the morning, at noon, and at bedtime. May substitute to any manufacturer covered by patient's insurance., Disp: 1 each, Rfl: 1   Lancets Misc. MISC, 1 each by Does not apply route in the morning, at noon, and at bedtime. May substitute to any manufacturer covered by patient's insurance., Disp: 100 each, Rfl: 0   levothyroxine (SYNTHROID) 88  MCG tablet, Take 1 tablet (88 mcg total) by mouth daily before breakfast., Disp: 30 tablet, Rfl: 0   Magnesium Oxide 500 MG TABS, Take 1 tablet by mouth once daily, Disp: 90 tablet, Rfl: 0   meloxicam (MOBIC) 15 MG tablet, Take 15 mg by mouth daily., Disp: , Rfl:    pregabalin (LYRICA) 225 MG capsule, Take 1 capsule (225 mg total) by mouth 2 (two) times daily., Disp: 180 capsule, Rfl: 1   QUEtiapine (SEROQUEL) 25 MG tablet, Take 1 tablet (25 mg total) by mouth at bedtime., Disp: 90 tablet, Rfl: 1   rosuvastatin (CRESTOR) 5 MG tablet, Take 1 tablet (5 mg total) by mouth daily., Disp: 90 tablet, Rfl: 1   sildenafil (VIAGRA) 100 MG tablet, TAKE 1 TABLET(100 MG) BY MOUTH DAILY AS NEEDED FOR ERECTILE DYSFUNCTION, Disp: 30 tablet, Rfl: 0   tirzepatide (MOUNJARO) 12.5 MG/0.5ML Pen, ADMINISTER 12.5 MG UNDER THE SKIN 1 TIME A WEEK., Disp: 6 mL, Rfl: 0   traZODone (DESYREL) 100 MG tablet, Take 2 tablets (200 mg total) by mouth at bedtime. TAKE 1  & 1/2 (ONE & ONE-HALF) TABLETS BY MOUTH AT BEDTIME, Disp: 180 tablet, Rfl: 1   Blood Glucose Monitoring Suppl DEVI, 1 each by Does not apply route daily as needed. May substitute to any manufacturer covered by patient's insurance. (Patient not taking: Reported on 03/26/2024), Disp: 1 each, Rfl: 0   fluticasone (FLONASE) 50 MCG/ACT nasal spray, Place 2 sprays into both nostrils daily., Disp: 48 g, Rfl: 1  Allergies  Allergen Reactions   Morphine Itching    I personally reviewed active problem list, medication list, allergies, family history with the patient/caregiver today.   ROS  Ten systems reviewed and is negative except as mentioned in HPI    Objective Physical Exam CONSTITUTIONAL: Patient appears well-developed and well-nourished. No distress. HEENT: Head atraumatic, normocephalic, neck supple. CARDIOVASCULAR: Normal rate, regular rhythm and normal heart sounds. No murmur heard. Trace swelling on legs, stable. PULMONARY: Effort normal and breath sounds normal. No respiratory distress. ABDOMINAL: There is no tenderness or distention. MUSCULOSKELETAL: Normal gait. Without gross motor or sensory deficit. Redness around fifth finger, improved. PSYCHIATRIC: Patient has a normal mood and affect. Behavior is normal. Judgment and thought content normal.  Vitals:   03/26/24 0904  BP: 136/84  Pulse: 75  Resp: 16  SpO2: 100%  Weight: 271 lb (122.9 kg)  Height: 5\' 11"  (1.803 m)    Body mass index is 37.8 kg/m.  Recent Results (from the past 2160 hours)  Basic metabolic panel     Status: Abnormal   Collection Time: 03/05/24  9:00 AM  Result Value Ref Range   Sodium 136 135 - 145 mmol/L   Potassium 4.5 3.5 - 5.1 mmol/L   Chloride 101 98 - 111 mmol/L   CO2 31 22 - 32 mmol/L   Glucose, Bld 157 (H) 70 - 99 mg/dL    Comment: Glucose reference range applies only to samples taken after fasting for at least 8 hours.   BUN 11 6 - 20 mg/dL   Creatinine, Ser 6.57 0.61 - 1.24 mg/dL    Calcium 9.2 8.9 - 84.6 mg/dL   GFR, Estimated >96 >29 mL/min    Comment: (NOTE) Calculated using the CKD-EPI Creatinine Equation (2021)    Anion gap 4 (L) 5 - 15    Comment: Performed at [facility], [address] [place], [place] [address]  Glucose, capillary     Status: None   Collection  Time: 03/09/24  7:58 AM  Result Value Ref Range   Glucose-Capillary 87 70 - 99 mg/dL    Comment: Glucose reference range applies only to samples taken after fasting for at least 8 hours.   Comment 1 Notify RN    Comment 2 Document in Chart   Glucose, capillary     Status: Abnormal   Collection Time: 03/09/24 11:25 AM  Result Value Ref Range   Glucose-Capillary 136 (H) 70 - 99 mg/dL    Comment: Glucose reference range applies only to samples taken after fasting for at least 8 hours.  CBC with Differential     Status: Abnormal   Collection Time: 03/09/24  2:42 PM  Result Value Ref Range   WBC 8.4 4.0 - 10.5 K/uL   RBC 4.44 4.22 - 5.81 MIL/uL   Hemoglobin 12.8 (L) 13.0 - 17.0 g/dL   HCT 38.7 56.4 - 33.2 %   MCV 89.6 80.0 - 100.0 fL   MCH 28.8 26.0 - 34.0 pg   MCHC 32.2 30.0 - 36.0 g/dL   RDW 95.1 88.4 - 16.6 %   Platelets 170 150 - 400 K/uL   nRBC 0.0 0.0 - 0.2 %   Neutrophils Relative % 96 %   Neutro Abs 8.0 (H) 1.7 - 7.7 K/uL   Lymphocytes Relative 3 %   Lymphs Abs 0.3 (L) 0.7 - 4.0 K/uL   Monocytes Relative 1 %   Monocytes Absolute 0.1 0.1 - 1.0 K/uL   Eosinophils Relative 0 %   Eosinophils Absolute 0.0 0.0 - 0.5 K/uL   Basophils Relative 0 %   Basophils Absolute 0.0 0.0 - 0.1 K/uL   Immature Granulocytes 0 %   Abs Immature Granulocytes 0.03 0.00 - 0.07 K/uL    Comment: Performed at [facility], [address] [place], [place] [address]  Basic metabolic panel     Status: Abnormal   Collection Time: 03/09/24  2:42 PM  Result Value Ref Range   Sodium 136 135 - 145 mmol/L   Potassium 4.3 3.5 - 5.1 mmol/L   Chloride 104 98 - 111 mmol/L   CO2 23 22 - 32 mmol/L    Glucose, Bld 211 (H) 70 - 99 mg/dL    Comment: Glucose reference range applies only to samples taken after fasting for at least 8 hours.   BUN 15 6 - 20 mg/dL   Creatinine, Ser 6.01 (H) 0.61 - 1.24 mg/dL   Calcium 8.5 (L) 8.9 - 10.3 mg/dL   GFR, Estimated >09 >32 mL/min    Comment: (NOTE) Calculated using the CKD-EPI Creatinine Equation (2021)    Anion gap 9 5 - 15    Comment: Performed at [facility], [address], [place], [place] [address]  Aerobic/Anaerobic Culture w Gram Stain (surgical/deep wound)     Status: None   Collection Time: 03/09/24  7:06 PM   Specimen: Abscess  Result Value Ref Range   Specimen Description      ABSCESS Performed at [facility], [address], [place], [place] [address]    Special Requests      right pinky Performed at [facility], [address] [place], [place] [address]    Gram Stain      FEW WBC PRESENT, PREDOMINANTLY MONONUCLEAR FEW GRAM POSITIVE COCCI IN CLUSTERS    Culture      ABUNDANT METHICILLIN RESISTANT STAPHYLOCOCCUS AUREUS NO ANAEROBES ISOLATED Performed at [facility], [address], [place], [place] [address]  Report Status 03/14/2024 FINAL    Organism ID, Bacteria METHICILLIN RESISTANT STAPHYLOCOCCUS AUREUS       Susceptibility   Methicillin resistant staphylococcus aureus - MIC*    CIPROFLOXACIN >=8 RESISTANT Resistant     ERYTHROMYCIN >=8 RESISTANT Resistant     GENTAMICIN <=0.5 SENSITIVE Sensitive     OXACILLIN >=4 RESISTANT Resistant     TETRACYCLINE <=1 SENSITIVE Sensitive     VANCOMYCIN 1 SENSITIVE Sensitive     TRIMETH/SULFA <=10 SENSITIVE Sensitive     CLINDAMYCIN <=0.25 SENSITIVE Sensitive     RIFAMPIN <=0.5 SENSITIVE Sensitive     Inducible Clindamycin NEGATIVE Sensitive     LINEZOLID 2 SENSITIVE Sensitive     * ABUNDANT METHICILLIN RESISTANT STAPHYLOCOCCUS AUREUS  POCT glycosylated hemoglobin (Hb A1C)     Status: Abnormal   Collection Time: 03/26/24  9:08 AM   Result Value Ref Range   Hemoglobin A1C 5.7 (A) 4.0 - 5.6 %   HbA1c POC (<> result, manual entry)     HbA1c, POC (prediabetic range)     HbA1c, POC (controlled diabetic range)      Diabetic Foot Exam:     PHQ2/9:    03/26/2024    9:03 AM 02/06/2024    7:52 AM 11/26/2023    9:06 AM 09/05/2023    8:20 AM 07/29/2023    8:13 AM  Depression screen PHQ 2/9  Decreased Interest 0 0 0 0 0  Down, Depressed, Hopeless 0 0 0 0 0  PHQ - 2 Score 0 0 0 0 0  Altered sleeping 0 0 0 0 0  Tired, decreased energy 0 0 0 0 0  Change in appetite 0 0 0 0 0  Feeling bad or failure about yourself  0 0 0 0 0  Trouble concentrating 0 0 0 0 0  Moving slowly or fidgety/restless 0 0 0 0 0  Suicidal thoughts 0 0 0 0 0  PHQ-9 Score 0 0 0 0 0  Difficult doing work/chores Not difficult at all Not difficult at all Not difficult at all      phq 9 is negative  Fall Risk:    02/06/2024    7:52 AM 11/26/2023    9:06 AM 09/05/2023    8:20 AM 07/29/2023    8:13 AM 07/24/2023    2:39 PM  Fall Risk   Falls in the past year? 0 0 1 1 1   Number falls in past yr: 0 0 1 1 1   Injury with Fall? 0 0 1 1 1   Comment     head fall but refused to go to ER;torn shoulder ligaments  Risk for fall due to : No Fall Risks No Fall Risks History of fall(s) History of fall(s);Orthopedic patient No Fall Risks  Follow up Falls prevention discussed;Education provided;Falls evaluation completed Falls prevention discussed;Education provided;Falls evaluation completed Falls prevention discussed Falls prevention discussed;Education provided;Falls evaluation completed Education provided;Falls prevention discussed   Assessment & Plan Infected Felon of the Fifth Finger Infection improving with reduced swelling and redness. Numbness present, no significant pain. Follow-up with orthopedics crucial to monitor for osteomyelitis. - Continue follow-up with orthopedic specialist in two weeks. - Complete SOFA antibiotic course. - Wrap lesion for  protection.  Type 2 Diabetes Mellitus with associated dyslipidemia and neuropathy Diabetes well-controlled with A1c of 5.7%. Blood sugars in the 100s, no hypoglycemia symptoms. Metformin can be discontinued. - Discontinue metformin. - Continue Mounjaro 12.5 mg once a week. - Monitor blood glucose levels regularly. - Avoid sugary  drinks.  Diabetic Peripheral Neuropathy Mild symptoms managed with duloxetine. Prefers not to use gabapentin or pregabalin. - Continue duloxetine and Lyrica  Dyslipidemia LDL well-controlled at 32, HDL low. Emphasis on dietary changes and physical activity to improve HDL. - Continue rosuvastatin. - Encourage dietary changes and increase physical activity. - Order cholesterol panel.  Atherosclerosis of the Aorta Managed by controlling cholesterol and blood sugar. - Continue rosuvastatin. - Maintain blood sugar control.  Chronic Low Back Pain Pain managed with Tylenol, Lyrica and Flexeril . History of left foot drop. - Continue medications   Obstructive Sleep Apnea No CPAP use post-bariatric surgery. Reports feeling rested, no snoring complaints.  Major Depressive Disorder and Anxiety Emotionally stable, sleeping well on current medications. - Continue current medications.  Hypothyroidism TSH levels normalized. Uncertainty about current Synthroid dose. - Verify Synthroid dose with wife. ( She said 88 mcg daily)  - Order thyroid function tests.  Iron Deficiency Anemia Possibly related to bariatric surgery. Taking iron supplements. - Order iron studies.  Vitamin D Deficiency Slightly low levels, taking supplements. - Continue vitamin D supplementation.  Vitamin B12 Deficiency Managed with B12 injections, possibly related to absorption issues post-surgery. - Order B12 level.  Gout No recent attacks, previously on meloxicam.  General Health Maintenance Advised to monitor and manage health conditions, maintain healthy diet and lifestyle. -  Encourage diabetic diet and weight loss. - Order comprehensive metabolic panel. - Schedule follow-up in four months.

## 2024-03-27 LAB — COMPREHENSIVE METABOLIC PANEL WITH GFR
AG Ratio: 1.8 (calc) (ref 1.0–2.5)
ALT: 12 U/L (ref 9–46)
AST: 20 U/L (ref 10–35)
Albumin: 4.3 g/dL (ref 3.6–5.1)
Alkaline phosphatase (APISO): 99 U/L (ref 35–144)
BUN/Creatinine Ratio: 15 (calc) (ref 6–22)
BUN: 21 mg/dL (ref 7–25)
CO2: 32 mmol/L (ref 20–32)
Calcium: 9.4 mg/dL (ref 8.6–10.3)
Chloride: 98 mmol/L (ref 98–110)
Creat: 1.43 mg/dL — ABNORMAL HIGH (ref 0.70–1.30)
Globulin: 2.4 g/dL (ref 1.9–3.7)
Glucose, Bld: 85 mg/dL (ref 65–99)
Potassium: 5.3 mmol/L (ref 3.5–5.3)
Sodium: 135 mmol/L (ref 135–146)
Total Bilirubin: 0.9 mg/dL (ref 0.2–1.2)
Total Protein: 6.7 g/dL (ref 6.1–8.1)
eGFR: 59 mL/min/{1.73_m2} — ABNORMAL LOW (ref >=60)

## 2024-03-27 LAB — LIPID PANEL
Cholesterol: 104 mg/dL (ref <200)
HDL: 42 mg/dL (ref >=40)
LDL Cholesterol (Calc): 48 mg/dL
Non-HDL Cholesterol (Calc): 62 mg/dL (ref <130)
Total CHOL/HDL Ratio: 2.5 (calc) (ref <5.0)
Triglycerides: 64 mg/dL (ref <150)

## 2024-03-27 LAB — TSH: TSH: 1.88 m[IU]/L (ref 0.40–4.50)

## 2024-03-27 LAB — IRON,TIBC AND FERRITIN PANEL
%SAT: 38 % (ref 20–48)
Ferritin: 84 ng/mL (ref 38–380)
Iron: 98 ug/dL (ref 50–180)
TIBC: 261 ug/dL (ref 250–425)

## 2024-03-27 LAB — B12 AND FOLATE PANEL
Folate: 9.5 ng/mL
Vitamin B-12: 227 pg/mL (ref 200–1100)

## 2024-03-29 ENCOUNTER — Encounter: Payer: Self-pay | Admitting: Family Medicine

## 2024-04-22 ENCOUNTER — Other Ambulatory Visit: Payer: Self-pay | Admitting: Family Medicine

## 2024-06-07 ENCOUNTER — Other Ambulatory Visit: Payer: Self-pay | Admitting: Family Medicine

## 2024-07-01 ENCOUNTER — Other Ambulatory Visit: Payer: Self-pay | Admitting: Family Medicine

## 2024-07-01 DIAGNOSIS — E039 Hypothyroidism, unspecified: Secondary | ICD-10-CM

## 2024-07-15 ENCOUNTER — Other Ambulatory Visit: Payer: Self-pay | Admitting: Family Medicine

## 2024-07-15 DIAGNOSIS — F339 Major depressive disorder, recurrent, unspecified: Secondary | ICD-10-CM

## 2024-07-21 ENCOUNTER — Other Ambulatory Visit: Payer: Self-pay | Admitting: Family Medicine

## 2024-07-21 DIAGNOSIS — E1169 Type 2 diabetes mellitus with other specified complication: Secondary | ICD-10-CM

## 2024-07-21 DIAGNOSIS — I700 Atherosclerosis of aorta: Secondary | ICD-10-CM

## 2024-07-21 DIAGNOSIS — F339 Major depressive disorder, recurrent, unspecified: Secondary | ICD-10-CM

## 2024-07-21 DIAGNOSIS — G4709 Other insomnia: Secondary | ICD-10-CM

## 2024-07-24 ENCOUNTER — Other Ambulatory Visit: Payer: Self-pay | Admitting: Family Medicine

## 2024-07-24 DIAGNOSIS — E1169 Type 2 diabetes mellitus with other specified complication: Secondary | ICD-10-CM

## 2024-07-25 ENCOUNTER — Other Ambulatory Visit: Payer: Self-pay | Admitting: Family Medicine

## 2024-07-25 DIAGNOSIS — F339 Major depressive disorder, recurrent, unspecified: Secondary | ICD-10-CM

## 2024-07-26 ENCOUNTER — Other Ambulatory Visit: Payer: Self-pay

## 2024-07-26 ENCOUNTER — Ambulatory Visit: Payer: Self-pay

## 2024-07-26 ENCOUNTER — Observation Stay
Admission: EM | Admit: 2024-07-26 | Discharge: 2024-07-27 | Disposition: A | Discharge status: Home / Self Care | Source: Ambulatory Visit | Attending: Gastroenterology | Admitting: Gastroenterology

## 2024-07-26 ENCOUNTER — Emergency Department

## 2024-07-26 ENCOUNTER — Encounter: Payer: Self-pay | Admitting: Emergency Medicine

## 2024-07-26 DIAGNOSIS — F32A Depression, unspecified: Secondary | ICD-10-CM | POA: Diagnosis not present

## 2024-07-26 DIAGNOSIS — E118 Type 2 diabetes mellitus with unspecified complications: Secondary | ICD-10-CM | POA: Diagnosis not present

## 2024-07-26 DIAGNOSIS — F419 Anxiety disorder, unspecified: Secondary | ICD-10-CM

## 2024-07-26 DIAGNOSIS — E1142 Type 2 diabetes mellitus with diabetic polyneuropathy: Secondary | ICD-10-CM | POA: Diagnosis not present

## 2024-07-26 DIAGNOSIS — Z87891 Personal history of nicotine dependence: Secondary | ICD-10-CM | POA: Diagnosis not present

## 2024-07-26 DIAGNOSIS — Z6839 Body mass index (BMI) 39.0-39.9, adult: Secondary | ICD-10-CM | POA: Insufficient documentation

## 2024-07-26 DIAGNOSIS — Z79899 Other long term (current) drug therapy: Secondary | ICD-10-CM | POA: Insufficient documentation

## 2024-07-26 DIAGNOSIS — D509 Iron deficiency anemia, unspecified: Secondary | ICD-10-CM | POA: Diagnosis not present

## 2024-07-26 DIAGNOSIS — D649 Anemia, unspecified: Secondary | ICD-10-CM | POA: Diagnosis not present

## 2024-07-26 DIAGNOSIS — Z87718 Personal history of other specified (corrected) congenital malformations of genitourinary system: Secondary | ICD-10-CM | POA: Diagnosis not present

## 2024-07-26 DIAGNOSIS — R0602 Shortness of breath: Secondary | ICD-10-CM | POA: Diagnosis present

## 2024-07-26 DIAGNOSIS — K221 Ulcer 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: Secondary | ICD-10-CM | POA: Diagnosis not present

## 2024-07-26 DIAGNOSIS — G8929 Other chronic pain: Secondary | ICD-10-CM

## 2024-07-26 DIAGNOSIS — K922 Gastrointestinal hemorrhage, unspecified: Secondary | ICD-10-CM | POA: Diagnosis not present

## 2024-07-26 DIAGNOSIS — Z8739 Personal history of other diseases of the musculoskeletal system and connective tissue: Secondary | ICD-10-CM

## 2024-07-26 DIAGNOSIS — E669 Obesity, unspecified: Secondary | ICD-10-CM

## 2024-07-26 DIAGNOSIS — E66812 Obesity, class 2: Secondary | ICD-10-CM | POA: Diagnosis not present

## 2024-07-26 DIAGNOSIS — K921 Melena: Secondary | ICD-10-CM | POA: Diagnosis not present

## 2024-07-26 DIAGNOSIS — Z7984 Long term (current) use of oral hypoglycemic drugs: Secondary | ICD-10-CM | POA: Insufficient documentation

## 2024-07-26 LAB — BASIC METABOLIC PANEL WITH GFR
Anion gap: 7 (ref 5–15)
BUN: 31 mg/dL — ABNORMAL HIGH (ref 6–20)
CO2: 25 mmol/L (ref 22–32)
Calcium: 8.3 mg/dL — ABNORMAL LOW (ref 8.9–10.3)
Chloride: 111 mmol/L (ref 98–111)
Creatinine, Ser: 1.2 mg/dL (ref 0.61–1.24)
GFR, Estimated: >60 mL/min (ref >60)
Glucose, Bld: 122 mg/dL — ABNORMAL HIGH (ref 70–99)
Potassium: 4.7 mmol/L (ref 3.5–5.1)
Sodium: 143 mmol/L (ref 135–145)

## 2024-07-26 LAB — IRON AND TIBC
Iron: 35 ug/dL — ABNORMAL LOW (ref 45–182)
Saturation Ratios: 14 % — ABNORMAL LOW (ref 17.9–39.5)
TIBC: 253 ug/dL (ref 250–450)
UIBC: 218 ug/dL

## 2024-07-26 LAB — HEPATIC FUNCTION PANEL
ALT: 17 U/L (ref 0–44)
AST: 22 U/L (ref 15–41)
Albumin: 3 g/dL — ABNORMAL LOW (ref 3.5–5.0)
Alkaline Phosphatase: 74 U/L (ref 38–126)
Bilirubin, Direct: <0.1 mg/dL (ref 0.0–0.2)
Total Bilirubin: 0.4 mg/dL (ref 0.0–1.2)
Total Protein: 5.4 g/dL — ABNORMAL LOW (ref 6.5–8.1)

## 2024-07-26 LAB — ABO/RH: ABO/RH(D): O POS

## 2024-07-26 LAB — CBC
HCT: 21.4 % — ABNORMAL LOW (ref 39.0–52.0)
Hemoglobin: 6.7 g/dL — ABNORMAL LOW (ref 13.0–17.0)
MCH: 29.8 pg (ref 26.0–34.0)
MCHC: 31.3 g/dL (ref 30.0–36.0)
MCV: 95.1 fL (ref 80.0–100.0)
Platelets: 193 K/uL (ref 150–400)
RBC: 2.25 MIL/uL — ABNORMAL LOW (ref 4.22–5.81)
RDW: 15.6 % — ABNORMAL HIGH (ref 11.5–15.5)
WBC: 7.4 K/uL (ref 4.0–10.5)
nRBC: 0 % (ref 0.0–0.2)

## 2024-07-26 LAB — PROTIME-INR
INR: 1 (ref 0.8–1.2)
Prothrombin Time: 13.5 s (ref 11.4–15.2)

## 2024-07-26 LAB — GLUCOSE, CAPILLARY: Glucose-Capillary: 222 mg/dL — ABNORMAL HIGH (ref 70–99)

## 2024-07-26 LAB — RETICULOCYTES
Immature Retic Fract: 40.1 % — ABNORMAL HIGH (ref 2.3–15.9)
RBC.: 1.82 MIL/uL — ABNORMAL LOW (ref 4.22–5.81)
Retic Count, Absolute: 88.5 K/uL (ref 19.0–186.0)
Retic Ct Pct: 4.9 % — ABNORMAL HIGH (ref 0.4–3.1)

## 2024-07-26 LAB — FERRITIN: Ferritin: 27 ng/mL (ref 24–336)

## 2024-07-26 LAB — PREPARE RBC (CROSSMATCH)

## 2024-07-26 MED ORDER — ACETAMINOPHEN 325 MG PO TABS
650.0000 mg | ORAL_TABLET | Freq: Four times a day (QID) | ORAL | Status: DC | PRN
Start: 2024-07-26 — End: 2024-07-27

## 2024-07-26 MED ORDER — CYCLOBENZAPRINE HCL 10 MG PO TABS: 10.0000 mg | ORAL_TABLET | Freq: Every evening | ORAL | Status: DC | PRN

## 2024-07-26 MED ORDER — QUETIAPINE FUMARATE 25 MG PO TABS
25.0000 mg | ORAL_TABLET | Freq: Every day | ORAL | Status: DC
  Administered 2024-07-26: 25 mg via ORAL
  Filled 2024-07-26: qty 1

## 2024-07-26 MED ORDER — PANTOPRAZOLE SODIUM 40 MG IV SOLR
80.0000 mg | Freq: Once | INTRAVENOUS | Status: AC
  Administered 2024-07-26: 80 mg via INTRAVENOUS
  Filled 2024-07-26: qty 20

## 2024-07-26 MED ORDER — ALLOPURINOL 100 MG PO TABS
100.0000 mg | ORAL_TABLET | Freq: Two times a day (BID) | ORAL | Status: DC
  Administered 2024-07-26 – 2024-07-27 (×2): 100 mg via ORAL
  Filled 2024-07-26 (×2): qty 1

## 2024-07-26 MED ORDER — ALBUTEROL SULFATE (2.5 MG/3ML) 0.083% IN NEBU: 2.5000 mg | INHALATION_SOLUTION | Freq: Four times a day (QID) | RESPIRATORY_TRACT | Status: DC | PRN

## 2024-07-26 MED ORDER — FUROSEMIDE 10 MG/ML IJ SOLN
40.0000 mg | Freq: Once | INTRAMUSCULAR | Status: AC
  Administered 2024-07-27: 40 mg via INTRAVENOUS
  Filled 2024-07-26: qty 4

## 2024-07-26 MED ORDER — ROSUVASTATIN CALCIUM 5 MG PO TABS
5.0000 mg | ORAL_TABLET | Freq: Every day | ORAL | Status: DC
Start: 2024-07-27 — End: 2024-07-27
  Administered 2024-07-27: 5 mg via ORAL
  Filled 2024-07-26: qty 1

## 2024-07-26 MED ORDER — BUSPIRONE HCL 10 MG PO TABS
5.0000 mg | ORAL_TABLET | Freq: Two times a day (BID) | ORAL | Status: DC
  Administered 2024-07-26 – 2024-07-27 (×2): 5 mg via ORAL
  Filled 2024-07-26 (×2): qty 1

## 2024-07-26 MED ORDER — ACETAMINOPHEN 650 MG RE SUPP: 650.0000 mg | Freq: Four times a day (QID) | RECTAL | Status: DC | PRN

## 2024-07-26 MED ORDER — SODIUM CHLORIDE 0.9 % IV SOLN
10.0000 mL/h | Freq: Once | INTRAVENOUS | Status: AC
  Administered 2024-07-26: 10 mL/h via INTRAVENOUS

## 2024-07-26 MED ORDER — ONDANSETRON HCL 4 MG PO TABS: 4.0000 mg | ORAL_TABLET | Freq: Four times a day (QID) | ORAL | Status: DC | PRN

## 2024-07-26 MED ORDER — INSULIN ASPART 100 UNIT/ML IJ SOLN: 0.0000 [IU] | Freq: Three times a day (TID) | INTRAMUSCULAR | Status: DC

## 2024-07-26 MED ORDER — DULOXETINE HCL 60 MG PO CPEP
60.0000 mg | ORAL_CAPSULE | Freq: Every day | ORAL | Status: DC
Start: 2024-07-27 — End: 2024-07-27
  Administered 2024-07-27: 60 mg via ORAL
  Filled 2024-07-26: qty 2
  Filled 2024-07-26: qty 1

## 2024-07-26 MED ORDER — LEVOTHYROXINE SODIUM 88 MCG PO TABS
88.0000 ug | ORAL_TABLET | Freq: Every day | ORAL | Status: DC
  Administered 2024-07-27: 88 ug via ORAL
  Filled 2024-07-26: qty 1

## 2024-07-26 MED ORDER — ONDANSETRON HCL 4 MG/2ML IJ SOLN: 4.0000 mg | Freq: Four times a day (QID) | INTRAMUSCULAR | Status: DC | PRN

## 2024-07-26 MED ORDER — PREGABALIN 75 MG PO CAPS
225.0000 mg | ORAL_CAPSULE | Freq: Two times a day (BID) | ORAL | Status: DC
  Administered 2024-07-26 – 2024-07-27 (×2): 225 mg via ORAL
  Filled 2024-07-26 (×2): qty 1

## 2024-07-26 MED ORDER — FLUTICASONE PROPIONATE 50 MCG/ACT NA SUSP
2.0000 | Freq: Every day | NASAL | Status: DC
  Administered 2024-07-27: 2 via NASAL
  Filled 2024-07-26: qty 16

## 2024-07-26 MED ORDER — PANTOPRAZOLE SODIUM 40 MG IV SOLR
40.0000 mg | Freq: Two times a day (BID) | INTRAVENOUS | Status: DC
  Administered 2024-07-26 – 2024-07-27 (×2): 40 mg via INTRAVENOUS
  Filled 2024-07-26 (×2): qty 10

## 2024-07-26 MED ORDER — INSULIN ASPART 100 UNIT/ML IJ SOLN
0.0000 [IU] | Freq: Every day | INTRAMUSCULAR | Status: DC
  Administered 2024-07-26: 2 [IU] via SUBCUTANEOUS
  Filled 2024-07-26: qty 1

## 2024-07-26 MED ORDER — TRAZODONE HCL 50 MG PO TABS
200.0000 mg | ORAL_TABLET | Freq: Every day | ORAL | Status: DC
  Administered 2024-07-26: 200 mg via ORAL
  Filled 2024-07-26: qty 2

## 2024-07-26 NOTE — Assessment & Plan Note (Signed)
 On Cymbalta , trazodone  and BuSpar  and Seroquel 

## 2024-07-26 NOTE — Assessment & Plan Note (Signed)
 Hold Mounjaro  and Glucophage  and placed on sliding scale

## 2024-07-26 NOTE — Assessment & Plan Note (Signed)
 Weight might not be accurate.  Class II obesity with BMI of 39.87

## 2024-07-26 NOTE — ED Triage Notes (Signed)
 Patient to ED via POV from [facility] for SOB/dizziness. Pt reports symptoms started on Friday after falling- tripped over a stick. Denies hitting head, LOC or blood thinners. Denies any complaints from the fall.

## 2024-07-26 NOTE — Assessment & Plan Note (Signed)
 Continue Lyrica.

## 2024-07-26 NOTE — H&P (Signed)
 History and Physical    Patient: [name] FMW:[id number] DOB: 11-Nov-1971 DOA: 07/26/2024 DOS: the patient was seen and examined on 07/26/2024 PCP: Glenard Mire, MD  Patient coming from: Home  Chief Complaint:  Chief Complaint  Patient presents with   Shortness of Breath   HPI: [name] is a 53 y.o. male with medical history significant of hypertension, hyperlipidemia, diabetes, testicular cancer, left foot drop, hypothyroidism, depression and Roux-en-Y gastric bypass.  He is presenting to the hospital with symptoms starting from Saturday with shortness of breath, low energy, lightheadedness, dizziness.  He has noticed black stools twice a day since Saturday described as tarry.  Wife has noticed his color is not right.  In the ER found to be anemic with a hemoglobin of 6.7.  Hospitalist services were contacted for further evaluation.  ER team ordered transfusion and consulted gastroenterology.  Patient will be made n.p.o. after midnight for endoscopy.  Patient started on Protonix . Review of Systems: Review of Systems  Constitutional:  Positive for malaise/fatigue. Negative for chills, fever and weight loss.  HENT:  Negative for hearing loss and sore throat.   Eyes:  Negative for blurred vision.  Respiratory:  Positive for shortness of breath. Negative for cough and hemoptysis.   Cardiovascular:  Negative for chest pain.  Gastrointestinal:  Positive for melena. Negative for abdominal pain, constipation, diarrhea, nausea and vomiting.  Genitourinary:  Negative for dysuria.  Musculoskeletal:  Positive for back pain.  Neurological:  Positive for dizziness. Negative for focal weakness.  Endo/Heme/Allergies:  Does not bruise/bleed easily.  Psychiatric/Behavioral:  Positive for depression.     Past Medical History:  Diagnosis Date   Allergy    Anxiety    Depression    Diabetes (HCC)    Gout    Hiatal hernia    Hyperlipidemia    Hypertension    no meds now    Hypothyroidism    Metabolic syndrome    Obesity    OSA on CPAP    no CPAP   Radiculopathy    Seborrhea    Testicular cancer (HCC)    Testicular hypofunction    Urine ketone    Vitamin D  deficiency    Past Surgical History:  Procedure Laterality Date   BACK SURGERY     X 2   HERNIA REPAIR     left side and umbilical hernia repair   LAPAROSCOPY ABDOMEN DIAGNOSTIC  12/20/2023   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   SHOULDER ARTHROSCOPY Right 03/09/2024   Procedure: ARTHROSCOPY SHOULDER/DEBRIDEMENT;  Surgeon: Josefina Chew, MD;  Location: [facility];  Service: Orthopedics;  Laterality: Right;   SHOULDER ARTHROSCOPY W/ ROTATOR CUFF REPAIR Left 01/02/2023   SHOULDER ARTHROSCOPY WITH DISTAL CLAVICLE RESECTION Right 03/09/2024   Procedure: SHOULDER ARTHROSCOPY WITH DISTAL CLAVICLE EXCISION;  Surgeon: Josefina Chew, MD;  Location: [facility];  Service: Orthopedics;  Laterality: Right;   SHOULDER ARTHROSCOPY WITH ROTATOR CUFF REPAIR AND SUBACROMIAL DECOMPRESSION Right 03/09/2024   Procedure: SHOULDER ARTHROSCOPY WITH ROTATOR CUFF REPAIR AND SUBACROMIAL DECOMPRESSION;  Surgeon: Josefina Chew, MD;  Location: [facility];  Service: Orthopedics;  Laterality: Right;   URETEROTOMY Left 12/23/1998   Social History:  reports that he has been smoking cigars. He quit smokeless tobacco use about 32 years ago.  His smokeless tobacco use included chew. He reports that he does not drink alcohol and does not use drugs.  Allergies  Allergen Reactions   Morphine Itching    Family History  Problem Relation Age of Onset   Coronary artery disease Mother    Cancer Father        Melanoma and Testicular   Diabetes Father     Prior to Admission medications   Medication Sig Start Date End Date Taking? Authorizing Provider  albuterol  (VENTOLIN  HFA) 108 (90 Base) MCG/ACT inhaler Inhale 2 puffs into the lungs every 6 (six) hours as needed for wheezing or shortness  of breath. 01/06/23  Yes Patel, Sona, MD  ALLERGY RELIEF 180 MG tablet TAKE 1 TABLET(180 MG) BY MOUTH DAILY 11/14/21  Yes Sowles, Krichna, MD  allopurinol  (ZYLOPRIM ) 100 MG tablet Take 1 tablet (100 mg total) by mouth 2 (two) times daily. 11/26/23  Yes Sowles, Krichna, MD  busPIRone  (BUSPAR ) 5 MG tablet TAKE 1 TO 2 TABLETS BY MOUTH THREE TIMES DAILY 07/15/24  Yes Sowles, Krichna, MD  cyclobenzaprine  (FLEXERIL ) 10 MG tablet Take 1 tablet (10 mg total) by mouth 3 (three) times daily as needed for muscle spasms. TAKE 1 TABLET BY MOUTH AT BEDTIME 03/09/24  Yes Brown, Blaine K, PA-C  DULoxetine  (CYMBALTA ) 60 MG capsule TAKE 1 CAPSULE(60 MG) BY MOUTH DAILY 07/21/24  Yes Sowles, Krichna, MD  fluticasone  (FLONASE ) 50 MCG/ACT nasal spray Place 2 sprays into both nostrils daily. 03/26/24  Yes Sowles, Krichna, MD  levothyroxine  (SYNTHROID ) 88 MCG tablet TAKE 1 TABLET(88 MCG) BY MOUTH DAILY BEFORE BREAKFAST 07/01/24  Yes Sowles, Krichna, MD  metFORMIN  (GLUCOPHAGE -XR) 750 MG 24 hr tablet Take 750 mg by mouth daily. 04/24/24  Yes [provider]  MOUNJARO  12.5 MG/0.5ML Pen ADMINISTER 12.5 MG UNDER THE SKIN 1 TIME A WEEK 04/23/24  Yes Sowles, Krichna, MD  pregabalin  (LYRICA ) 225 MG capsule TAKE 1 CAPSULE(225 MG) BY MOUTH TWICE DAILY 07/26/24  Yes Sowles, Krichna, MD  QUEtiapine  (SEROQUEL ) 25 MG tablet Take 1 tablet (25 mg total) by mouth at bedtime. 11/26/23  Yes Sowles, Krichna, MD  rosuvastatin  (CRESTOR ) 5 MG tablet TAKE 1 TABLET(5 MG) BY MOUTH DAILY 07/21/24  Yes Sowles, Krichna, MD  traZODone  (DESYREL ) 100 MG tablet TAKE 2 TABLETS BY MOUTH AT BEDTIME 07/21/24  Yes Sowles, Krichna, MD  Accu-Chek Softclix Lancets lancets 1 each 3 (three) times daily. 03/17/24   [provider]  Blood Glucose Monitoring Suppl (ACCU-CHEK GUIDE ME) w/Device KIT as directed. 03/17/24   [provider]  glucose blood (ACCU-CHEK GUIDE TEST) test strip USE ONE STRIP DAILY 06/07/24   Sowles, Krichna, MD    Physical  Exam: Vitals:   07/26/24 1502 07/26/24 1507 07/26/24 1620 07/26/24 1707  BP: 122/73   119/86  Pulse: (!) 136 68  72  Resp: 18   20  Temp: 97.8 F (36.6 C)     TempSrc: Oral     SpO2: 100%  100% 100%  Weight:      Height:       Physical Exam HENT:     Head: Normocephalic.  Eyes:     General: Lids are normal.     Conjunctiva/sclera: Conjunctivae normal.  Cardiovascular:     Rate and Rhythm: Normal rate and regular rhythm.     Heart sounds: Normal heart sounds, S1 normal and S2 normal.  Pulmonary:     Breath sounds: No decreased breath sounds, wheezing, rhonchi or rales.  Abdominal:     Palpations: Abdomen is soft.     Tenderness: There is no abdominal tenderness.  Musculoskeletal:     Right lower leg: Swelling present.     Left lower leg: Swelling present.  Skin:    General: Skin is warm.     Findings: No rash.  Neurological:     Mental Status: He is alert and oriented to person, place, and time.     Data Reviewed: EKG reviewed by me shows normal sinus rhythm 68 bpm, LVH INR 1.0, reticulocyte count 4.9%, hemoglobin 6.7, platelet count 193, white blood cell count 7.4, creatinine 1.2, electrolytes normal range, glucose 122 Assessment and Plan: * Symptomatic anemia ER team ordered for transfusions.  Hemoglobin 6.7.  Will add on iron  studies and check a B12 level.  Upper GI bleed With melena.  IV Protonix  twice daily.  Patient does not take any anti-inflammatories.  History of Roux-en-Y gastric bypass (could be anastomotic ulcer).  Type 2 diabetes mellitus with complication, without long-term current use of insulin  (HCC) Hold Mounjaro  and Glucophage  and placed on sliding scale  Diabetic polyneuropathy (HCC) Continue Lyrica   Anxiety and depression On Cymbalta , trazodone  and BuSpar  and Seroquel   Obesity (BMI 30-39.9) Weight might not be accurate.  Class II obesity with BMI of 39.87  History of gout On allopurinol       Advance Care Planning:   Code Status: Full  Code   Consults: Gastroenterology consulted for ER  Family Communication: Wife at bedside  Severity of Illness: The appropriate patient status for this patient is INPATIENT. Inpatient status is judged to be reasonable and necessary in order to provide the required intensity of service to ensure the patient's safety. The patient's presenting symptoms, physical exam findings, and initial radiographic and laboratory data in the context of their chronic comorbidities is felt to place them at high risk for further clinical deterioration. Furthermore, it is not anticipated that the patient will be medically stable for discharge from the hospital within 2 midnights of admission.   * I certify that at the point of admission it is my clinical judgment that the patient will require inpatient hospital care spanning beyond 2 midnights from the point of admission due to high intensity of service, high risk for further deterioration and high frequency of surveillance required.*  Author: Charlie Patterson, MD 07/26/2024 6:12 PM  For on call review [email or web address].

## 2024-07-26 NOTE — Telephone Encounter (Signed)
 FYI Only or Action Required?: FYI only for provider.  Patient was last seen in primary care on 03/26/2024 by Glenard Mire, MD.  Called Nurse Triage reporting Dizziness.  Symptoms began several days ago.  Interventions attempted: Nothing.  Symptoms are: unchanged.Dizziness, Lightheaded, feltclammy on Saturday. May be jaundice.  Triage Disposition: See Physician Within 24 Hours  Patient/caregiver understands and will follow disposition?: Yes - will go to UC               Copied from CRM #[id number]. Topic: Clinical - Red Word Triage >> Jul 26, 2024 10:36 AM Adelita E wrote: [facility] that prompted transfer to Nurse Triage: Dizziness, light-headedness, and nauseous for 3 days. Patient's wife, Eleanor on the line. Reason for Disposition  [1] MODERATE dizziness (e.g., interferes with normal activities) AND [2] has NOT been evaluated by doctor (or NP/PA) for this  (Exception: Dizziness caused by heat exposure, sudden standing, or poor fluid intake.)  Answer Assessment - Initial Assessment Questions 1. DESCRIPTION: Describe your dizziness.     Lightheaded room was spinning on Saturday 2. LIGHTHEADED: Do you feel lightheaded? (e.g., somewhat faint, woozy, weak upon standing)     lightheaded 3. VERTIGO: Do you feel like either you or the room is spinning or tilting? (i.e., vertigo)     Saturday room was spinning 4. SEVERITY: How bad is it?  Do you feel like you are going to faint? Can you stand and walk?     Can walk 5. ONSET:  When did the dizziness begin?     3 days 8. CAUSE: What do you think is causing the dizziness? (e.g., decreased fluids or food, diarrhea, emotional distress, heat exposure, new medicine, sudden standing, vomiting; unknown)     Unsure 9. RECURRENT SYMPTOM: Have you had dizziness before? If Yes, ask: When was the last time? What happened that time?     Yes when taking BP medications 10. OTHER SYMPTOMS: Do you have any other  symptoms? (e.g., fever, chest pain, vomiting, diarrhea, bleeding)       Has not taking any BP medications  Protocols used: Dizziness - Lightheadedness-A-AH

## 2024-07-26 NOTE — ED Provider Notes (Signed)
 [facility] Emergency Department Provider Note     Event Date/Time   First MD Initiated Contact with Patient 07/26/24 1617     (approximate)   History   Shortness of Breath   HPI  [name] is a 53 y.o. male with a history of HTN, DM obesity, hypothyroidism, HLD, vitamin deficiency, OSA on CPAP, and status post gastric bypass surgery, presents to the ED from [facility].  Patient presents for shortness of breath and dizziness.  He is also noted some generalized weakness and fatigue.  By his report, he admitted to melanotic stools during the same time course.  He continues have normal p.o. intake, denying any fevers, chills, chest pain, shortness of breath.  He noticed symptoms on Friday after he tripped and fell, by his report over a stick in the yard.  He denies any head injury or LOC.  He denies any blood thinner use.  Patient denies any acute injury related to the fall.  Patient's wife was at bedside, also noticed the patient has appeared pale, even to the point of looking jaundiced to her over the last week.  Patient denies any bright red blood per rectum, hematemesis, hemoptysis, bleeding gums, or abnormal bruising.    Physical Exam   Triage Vital Signs: ED Triage Vitals  Encounter Vitals Group     BP 07/26/24 1502 122/73     Girls Systolic BP Percentile --      Girls Diastolic BP Percentile --      Boys Systolic BP Percentile --      Boys Diastolic BP Percentile --      Pulse Rate 07/26/24 1502 (!) 136     Resp 07/26/24 1502 18     Temp 07/26/24 1502 97.8 F (36.6 C)     Temp Source 07/26/24 1502 Oral     SpO2 07/26/24 1502 100 %     Weight 07/26/24 1501 270 lb (122.5 kg)     Height 07/26/24 1501 5' 9 (1.753 m)     Head Circumference --      Peak Flow --      Pain Score 07/26/24 1501 0     Pain Loc --      Pain Education --      Exclude from Growth Chart --     Most recent vital signs: Vitals:   07/26/24 1620 07/26/24 1707  BP:   119/86  Pulse:  72  Resp:  20  Temp:    SpO2: 100% 100%    General Awake, no distress. NAD.  Patient appears generally pale HEENT NCAT. PERRL. EOMI. No rhinorrhea. Mucous membranes are moist. CV:  Good peripheral perfusion.  RESP:  Normal effort.  ABD:  No distention.  Soft and nontender.  No active bowel sounds noted.  No rebound, guarding, or rigidity noted.  DRE exam confirms frank melena with guaiac positive stool. MSK:  AROM of all extremities.   ED Results / Procedures / Treatments   Labs (all labs ordered are listed, but only abnormal results are displayed) Labs Reviewed  BASIC METABOLIC PANEL WITH GFR - Abnormal; Notable for the following components:      Result Value   Glucose, Bld 122 (*)    BUN 31 (*)    Calcium  8.3 (*)    All other components within normal limits  CBC - Abnormal; Notable for the following components:   RBC 2.25 (*)    Hemoglobin 6.7 (*)    HCT 21.4 (*)  RDW 15.6 (*)    All other components within normal limits  HEPATIC FUNCTION PANEL - Abnormal; Notable for the following components:   Total Protein 5.4 (*)    Albumin 3.0 (*)    All other components within normal limits  RETICULOCYTES - Abnormal; Notable for the following components:   Retic Ct Pct 4.9 (*)    RBC. 1.82 (*)    Immature Retic Fract 40.1 (*)    All other components within normal limits  PROTIME-INR  URINALYSIS, ROUTINE W REFLEX MICROSCOPIC  FERRITIN  IRON  AND TIBC  VITAMIN B12  CBC  BASIC METABOLIC PANEL WITH GFR  TYPE AND SCREEN  PREPARE RBC (CROSSMATCH)  ABO/RH    EKG  Vent. rate 68 BPM PR interval 162 ms QRS duration 96 ms QT/QTcB 404/429 ms P-R-T axes 46 -12 -2 Normal sinus rhythm Minimal voltage criteria for LVH, may be normal variant ( R in aVL ) Possible Anterior infarct , age undetermined Abnormal ECG When compared with ECG of 22-Sep-2023 11:18, Nonspecific T wave abnormality now evident in Inferior leads  RADIOLOGY  I personally viewed and  evaluated these images as part of my medical decision making, as well as reviewing the written report by the radiologist.  ED Provider Interpretation: No acute findings  DG Chest 2 View Result Date: 07/26/2024 CLINICAL DATA:  Shortness of breath EXAM: CHEST - 2 VIEW COMPARISON:  Chest radiograph dated 09/22/2023 FINDINGS: Normal lung volumes. No focal consolidations. No pleural effusion or pneumothorax. Enlarged cardiomediastinal silhouette. No acute osseous abnormality. IMPRESSION: 1. No acute cardiopulmonary process. 2. Enlarged cardiomediastinal silhouette. Electronically Signed   By: Limin  Xu M.D.   On: 07/26/2024 15:44   PROCEDURES:  Critical Care performed: Yes, see critical care procedure note(s)  Procedures  CRITICAL CARE Performed by: Candida LULLA Bast   Total critical care time: 30 minutes  Critical care time was exclusive of separately billable procedures and treating other patients.  Critical care was necessary to treat or prevent imminent or life-threatening deterioration.  Critical care was time spent personally by me on the following activities: development of treatment plan with patient and/or surrogate as well as nursing, discussions with consultants, evaluation of patient's response to treatment, examination of patient, obtaining history from patient or surrogate, ordering and performing treatments and interventions, ordering and review of laboratory studies, ordering and review of radiographic studies, pulse oximetry and re-evaluation of patient's condition.   MEDICATIONS ORDERED IN ED: Medications  0.9 %  sodium chloride  infusion (has no administration in time range)  pantoprazole  (PROTONIX ) injection 40 mg (has no administration in time range)  pantoprazole  (PROTONIX ) injection 80 mg (80 mg Intravenous Given 07/26/24 1703)     IMPRESSION / MDM / ASSESSMENT AND PLAN / ED COURSE  I reviewed the triage vital signs and the nursing notes.                               Differential diagnosis includes, but is not limited to, alcohol, illicit or prescription medications, or other toxic ingestion; stroke or intracerebral hemorrhage; fever or infectious causes including sepsis; hypoxemia and/or hypercarbia; uremia; trauma; hypoglycemia, and GI bleed, SBO, electrolyte abnormality,  etc.  Patient's presentation is most consistent with acute presentation with potential threat to life or bodily function.  Patient's diagnosis is consistent with symptomatic anemia due to melena.  Patient presents in no acute distress at this time for evaluation of his symptoms.  He is endorsing  some dizziness, weakness, and fatigue.  He did endorse melanotic stools over the last 3 to 4 days.  Normal p.o. intake otherwise.  Patient denies any excessive rectal bleeding, bruising, NSAID use, blood thinner use.  Labs reveal a anemia at 6.7 hematocrit at 21.7.  Patient noticed while is hemodynamically stable with reassuring labs without evidence of tachycardia, tachypnea, or hypotension.  Chest ray interpreted by me, shows no acute intrathoracic process.  EKG without evidence of malignant arrhythmia.  GI is consulted, and agreed with the plan of admission and blood transfusion.  No indication for emergent imaging at this time.  Patient will be admitted to the hospital service as planned.  Discussed the case with Dr. Ginnie with hospital service who will admit the patient as discussed.    Clinical Course as of 07/26/24 1754  Mon Jul 26, 2024  1658 Patient evaluated, notes melena for about 3 days.  Generalized fatigue and shortness of breath.  No pain.  Rectal exam with melena, immediately Hemoccult positive.  Hemodynamically stable during my eval.  Treated with 2 units PRBCs, Protonix .  Consented for blood transfusion.  Plan to discuss with GI and admit. [MM]  1712 D/w Dr. Jinny of [facility] to defer CTA Agrees w/ PPI and transfusion NPO at midnight, will eval in AM [MM]    Clinical Course  User Index [MM] Clarine Ozell LABOR, MD    FINAL CLINICAL IMPRESSION(S) / ED DIAGNOSES   Final diagnoses:  Symptomatic anemia  Melena  Gastrointestinal hemorrhage, unspecified gastrointestinal hemorrhage type     Rx / DC Orders   ED Discharge Orders     None        Note:  This document was prepared using Dragon voice recognition software and may include unintentional dictation errors.    Loyd Candida LULLA Aldona, PA-C 07/26/24 1759    Clarine Ozell LABOR, MD 07/27/24 [phone]

## 2024-07-26 NOTE — Assessment & Plan Note (Addendum)
 With melena.  IV Protonix  twice daily.  Patient does not take any anti-inflammatories.  History of Roux-en-Y gastric bypass (could be anastomotic ulcer).

## 2024-07-26 NOTE — Assessment & Plan Note (Signed)
 ER team ordered for transfusions.  Hemoglobin 6.7.  Will add on iron  studies and check a B12 level.

## 2024-07-26 NOTE — Assessment & Plan Note (Signed)
 On allopurinol.

## 2024-07-27 ENCOUNTER — Inpatient Hospital Stay: Admitting: Anesthesiology

## 2024-07-27 ENCOUNTER — Encounter: Payer: Self-pay | Admitting: Internal Medicine

## 2024-07-27 ENCOUNTER — Encounter: Admission: EM | Disposition: A | Discharge status: Home / Self Care | Payer: Self-pay | Source: Ambulatory Visit

## 2024-07-27 DIAGNOSIS — K449 Diaphragmatic hernia without obstruction or gangrene: Secondary | ICD-10-CM | POA: Diagnosis not present

## 2024-07-27 DIAGNOSIS — K221 Ulcer 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: Secondary | ICD-10-CM | POA: Diagnosis not present

## 2024-07-27 DIAGNOSIS — D649 Anemia, unspecified: Principal | ICD-10-CM

## 2024-07-27 DIAGNOSIS — K289 Gastrojejunal ulcer, unspecified as acute or chronic, without hemorrhage or perforation: Secondary | ICD-10-CM | POA: Diagnosis not present

## 2024-07-27 DIAGNOSIS — K921 Melena: Secondary | ICD-10-CM

## 2024-07-27 DIAGNOSIS — K922 Gastrointestinal hemorrhage, unspecified: Secondary | ICD-10-CM | POA: Diagnosis not present

## 2024-07-27 HISTORY — PX: ESOPHAGOGASTRODUODENOSCOPY: SHX5428

## 2024-07-27 LAB — TYPE AND SCREEN
ABO/RH(D): O POS
Antibody Screen: NEGATIVE
Unit division: 0
Unit division: 0

## 2024-07-27 LAB — CBC
HCT: 24.9 % — ABNORMAL LOW (ref 39.0–52.0)
Hemoglobin: 8.1 g/dL — ABNORMAL LOW (ref 13.0–17.0)
MCH: 29.8 pg (ref 26.0–34.0)
MCHC: 32.5 g/dL (ref 30.0–36.0)
MCV: 91.5 fL (ref 80.0–100.0)
Platelets: 192 K/uL (ref 150–400)
RBC: 2.72 MIL/uL — ABNORMAL LOW (ref 4.22–5.81)
RDW: 16.2 % — ABNORMAL HIGH (ref 11.5–15.5)
WBC: 6.2 K/uL (ref 4.0–10.5)
nRBC: 0 % (ref 0.0–0.2)

## 2024-07-27 LAB — GLUCOSE, CAPILLARY
Glucose-Capillary: 117 mg/dL — ABNORMAL HIGH (ref 70–99)
Glucose-Capillary: 100 mg/dL — ABNORMAL HIGH (ref 70–99)

## 2024-07-27 LAB — BPAM RBC
Blood Product Expiration Date: 202508072359
Blood Product Expiration Date: 202508082359
ISSUE DATE / TIME: 202508042024
ISSUE DATE / TIME: 202508042314
Unit Type and Rh: 5100
Unit Type and Rh: 5100

## 2024-07-27 LAB — BASIC METABOLIC PANEL WITH GFR
Anion gap: 6 (ref 5–15)
BUN: 26 mg/dL — ABNORMAL HIGH (ref 6–20)
CO2: 27 mmol/L (ref 22–32)
Calcium: 8.4 mg/dL — ABNORMAL LOW (ref 8.9–10.3)
Chloride: 112 mmol/L — ABNORMAL HIGH (ref 98–111)
Creatinine, Ser: 1.18 mg/dL (ref 0.61–1.24)
GFR, Estimated: >60 mL/min (ref >60)
Glucose, Bld: 101 mg/dL — ABNORMAL HIGH (ref 70–99)
Potassium: 4.4 mmol/L (ref 3.5–5.1)
Sodium: 145 mmol/L (ref 135–145)

## 2024-07-27 LAB — VITAMIN B12: Vitamin B-12: 332 pg/mL (ref 180–914)

## 2024-07-27 LAB — HIV ANTIBODY (ROUTINE TESTING W REFLEX): HIV Screen 4th Generation wRfx: NONREACTIVE

## 2024-07-27 SURGERY — EGD (ESOPHAGOGASTRODUODENOSCOPY)
Anesthesia: General

## 2024-07-27 MED ORDER — ACETAMINOPHEN 325 MG PO TABS: 650.0000 mg | ORAL_TABLET | Freq: Four times a day (QID) | ORAL | Status: AC | PRN

## 2024-07-27 MED ORDER — LIDOCAINE HCL (CARDIAC) PF 100 MG/5ML IV SOSY
PREFILLED_SYRINGE | INTRAVENOUS | Status: DC | PRN
  Administered 2024-07-27: 100 mg via INTRAVENOUS

## 2024-07-27 MED ORDER — PROPOFOL 10 MG/ML IV BOLUS
INTRAVENOUS | Status: DC | PRN
  Administered 2024-07-27 (×2): 50 mg via INTRAVENOUS

## 2024-07-27 MED ORDER — PHENYLEPHRINE 80 MCG/ML (10ML) SYRINGE FOR IV PUSH (FOR BLOOD PRESSURE SUPPORT)
PREFILLED_SYRINGE | INTRAVENOUS | Status: AC
Start: 2024-07-27 — End: 2024-07-27
  Filled 2024-07-27: qty 10

## 2024-07-27 MED ORDER — GLYCOPYRROLATE 0.2 MG/ML IJ SOLN
INTRAMUSCULAR | Status: AC
  Filled 2024-07-27: qty 1

## 2024-07-27 MED ORDER — DEXMEDETOMIDINE HCL IN NACL 80 MCG/20ML IV SOLN
INTRAVENOUS | Status: AC
  Filled 2024-07-27: qty 20

## 2024-07-27 MED ORDER — DEXMEDETOMIDINE HCL IN NACL 80 MCG/20ML IV SOLN
INTRAVENOUS | Status: DC | PRN
  Administered 2024-07-27: 12 ug via INTRAVENOUS
  Administered 2024-07-27: 8 ug via INTRAVENOUS

## 2024-07-27 MED ORDER — SODIUM CHLORIDE 0.9 % IV SOLN: INTRAVENOUS | Status: DC

## 2024-07-27 MED ORDER — IRON SUCROSE 300 MG IVPB - SIMPLE MED
300.0000 mg | Freq: Once | Status: AC
  Administered 2024-07-27: 300 mg via INTRAVENOUS
  Filled 2024-07-27: qty 300

## 2024-07-27 MED ORDER — GLYCOPYRROLATE 0.2 MG/ML IJ SOLN
INTRAMUSCULAR | Status: DC | PRN
  Administered 2024-07-27: .2 mg via INTRAVENOUS

## 2024-07-27 MED ORDER — PANTOPRAZOLE SODIUM 40 MG PO TBEC: 40.0000 mg | DELAYED_RELEASE_TABLET | Freq: Every day | ORAL | 0 refills | Status: AC

## 2024-07-27 MED ORDER — LIDOCAINE HCL (PF) 2 % IJ SOLN
INTRAMUSCULAR | Status: AC
  Filled 2024-07-27: qty 5

## 2024-07-27 MED ORDER — PROPOFOL 500 MG/50ML IV EMUL
INTRAVENOUS | Status: DC | PRN
  Administered 2024-07-27: 125 ug/kg/min via INTRAVENOUS

## 2024-07-27 NOTE — Discharge Instructions (Signed)
 Take protonix  40mg  po daily.  Buy over the counter nexium or omeprazole 40mg  and take in the evening for one month then stop.

## 2024-07-27 NOTE — Progress Notes (Signed)
 MD order received in [facility] to discharge pt home today; verbally reviewed AVS with pt, Rx escribed to [facility] in [place], [place]; no questions voiced at this time; pt discharged via wheelchair by volunteer to [facility] entrance

## 2024-07-27 NOTE — Discharge Summary (Signed)
 Physician Discharge Summary   Patient: [name] MRN: [id number] DOB: [birth year]  Admit date:     07/26/2024  Discharge date: 07/27/24  Discharge Physician: [name]   PCP: William Mire, MD   Recommendations at discharge:   Follow-up PCP 5 days  Discharge Diagnoses: Principal Problem:   Symptomatic anemia Active Problems:   Upper GI bleed   Type 2 diabetes mellitus with complication, without long-term current use of insulin  (HCC)   Diabetic polyneuropathy (HCC)   Anxiety and depression   Obesity (BMI 30-39.9)   History of gout   Melena   Ulcer 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    Hospital Course: 53 y.o. male with medical history significant of hypertension, hyperlipidemia, diabetes, testicular cancer, left foot drop, hypothyroidism, depression and Roux-en-Y gastric bypass.  He is presenting to the hospital with symptoms starting from Saturday with shortness of breath, low energy, lightheadedness, dizziness.  He has noticed black stools twice a day since Saturday described as tarry.  Wife has noticed his color is not right.  In the ER found to be anemic with a hemoglobin of 6.7.  Hospitalist services were contacted for further evaluation.  ER team ordered transfusion and consulted gastroenterology.  Patient will be made n.p.o. after midnight for endoscopy.  Patient started on Protonix .   8/5.  Patient's hemoglobin 8.1 after transfusion 2 units of packed red blood cells.  Will also give IV iron  with ferritin of 27.  Endoscopy showing a esophageal ulcer and anastomosis ulcer both of which were nonbleeding.  No further melena.  Gastroenterology cleared to go home.  Will refer to hematology as outpatient.  Assessment and Plan: * Symptomatic anemia ER team ordered for transfusions.  Hemoglobin 6.7 on presentation.  Received 2 units of packed red blood cells and hemoglobin 8.1 upon discharge.  Patient has iron  deficiency anemia with a ferritin of 27.  IV iron  given.  With his  history of Roux-en-Y I will refer to hematology as outpatient for iron  infusions.  Upper GI bleed With melena.  EGD showing ulcer of the esophagus and anastomosis ulcer both nonbleeding.  Gastroenterology cleared to go home with the risk of rebleeding is small.  Patient received IV Protonix  twice daily here in the hospital.  Will prescribe Protonix  daily upon getting out of the hospital and asked patient to take over-the-counter omeprazole and Nexium in the evening for 1 month then can stop.  Advise no anti-inflammatories.  Only Tylenol  for pain.  Type 2 diabetes mellitus with complication, without long-term current use of insulin  (HCC) Hold [name]  and Glucophage  and placed on sliding scale while here in the hospital can go back on these medications as outpatient.  Diabetic polyneuropathy (HCC) Continue Lyrica   Anxiety and depression On Cymbalta , trazodone  and BuSpar  and Seroquel   Obesity (BMI 30-39.9) Weight might not be accurate.  Class II obesity with BMI of 39.87  History of gout On allopurinol          Consultants: Gastroenterology Procedures performed: Endoscopy Disposition: Home Diet recommendation:  Cardiac and Carb modified diet DISCHARGE MEDICATION: Allergies as of 07/27/2024       Reactions   Morphine Itching        Medication List     STOP taking these medications    Allergy Relief 180 MG tablet Generic drug: fexofenadine        TAKE these medications    Accu-Chek Guide Me w/Device Kit as directed.   Accu-Chek Guide Test test strip Generic drug: glucose blood USE ONE STRIP DAILY  Accu-Chek Softclix Lancets lancets 1 each 3 (three) times daily.   acetaminophen  325 MG tablet Commonly known as: TYLENOL  Take 2 tablets (650 mg total) by mouth every 6 (six) hours as needed for mild pain (pain score 1-3) or fever (or Fever >/= 101).   albuterol  108 (90 Base) MCG/ACT inhaler Commonly known as: VENTOLIN  HFA Inhale 2 puffs into the lungs every 6  (six) hours as needed for wheezing or shortness of breath.   allopurinol  100 MG tablet Commonly known as: ZYLOPRIM  Take 1 tablet (100 mg total) by mouth 2 (two) times daily.   busPIRone  5 MG tablet Commonly known as: BUSPAR  TAKE 1 TO 2 TABLETS BY MOUTH THREE TIMES DAILY   cyclobenzaprine  10 MG tablet Commonly known as: FLEXERIL  Take 1 tablet (10 mg total) by mouth 3 (three) times daily as needed for muscle spasms. TAKE 1 TABLET BY MOUTH AT BEDTIME   DULoxetine  60 MG capsule Commonly known as: CYMBALTA  TAKE 1 CAPSULE(60 MG) BY MOUTH DAILY   fluticasone  50 MCG/ACT nasal spray Commonly known as: FLONASE  Place 2 sprays into both nostrils daily.   levothyroxine  88 MCG tablet Commonly known as: SYNTHROID  TAKE 1 TABLET(88 MCG) BY MOUTH DAILY BEFORE BREAKFAST   metFORMIN  750 MG 24 hr tablet Commonly known as: GLUCOPHAGE -XR Take 750 mg by mouth daily.   [name]  12.5 MG/0.5ML Pen Generic drug: tirzepatide  ADMINISTER 12.5 MG UNDER THE SKIN 1 TIME A WEEK   pantoprazole  40 MG tablet Commonly known as: Protonix  Take 1 tablet (40 mg total) by mouth daily.   pregabalin  225 MG capsule Commonly known as: LYRICA  TAKE 1 CAPSULE(225 MG) BY MOUTH TWICE DAILY   QUEtiapine  25 MG tablet Commonly known as: SEROQUEL  Take 1 tablet (25 mg total) by mouth at bedtime.   rosuvastatin  5 MG tablet Commonly known as: CRESTOR  TAKE 1 TABLET(5 MG) BY MOUTH DAILY   traZODone  100 MG tablet Commonly known as: DESYREL  TAKE 2 TABLETS BY MOUTH AT BEDTIME        Follow-up Information     Sowles, Krichna, MD Follow up on 08/03/2024.   Specialty: Family Medicine Why: Appt @ 10A on 8/12 Contact information: [address] [place] [address] [phone]         William Annah BROCKS, MD Follow up in 2 week(s).   Specialty: Oncology Why: Office didn't answer patient will have to schedule follow up appt anemia follow up Contact information: [address] [place]  [address] [phone]                Discharge Exam: [name]   07/26/24 1501  Weight: 122.5 kg   Physical Exam HENT:     Head: Normocephalic.  Eyes:     General: Lids are normal.     Conjunctiva/sclera: Conjunctivae normal.  Cardiovascular:     Rate and Rhythm: Normal rate and regular rhythm.     Heart sounds: Normal heart sounds, S1 normal and S2 normal.  Pulmonary:     Breath sounds: No decreased breath sounds, wheezing, rhonchi or rales.  Abdominal:     Palpations: Abdomen is soft.     Tenderness: There is no abdominal tenderness.  Musculoskeletal:     Right lower leg: No swelling.     Left lower leg: No swelling.  Skin:    General: Skin is warm.     Findings: No rash.  Neurological:     Mental Status: He is alert and oriented to person, place, and time.  Condition at discharge: stable  The results of significant diagnostics from this hospitalization (including imaging, microbiology, ancillary and laboratory) are listed below for reference.   Imaging Studies: DG Chest 2 View Result Date: 07/26/2024 CLINICAL DATA:  Shortness of breath EXAM: CHEST - 2 VIEW COMPARISON:  Chest radiograph dated 09/22/2023 FINDINGS: Normal lung volumes. No focal consolidations. No pleural effusion or pneumothorax. Enlarged cardiomediastinal silhouette. No acute osseous abnormality. IMPRESSION: 1. No acute cardiopulmonary process. 2. Enlarged cardiomediastinal silhouette. Electronically Signed   By: Limin  Xu M.D.   On: 07/26/2024 15:44      Labs: CBC: Recent Labs  Lab 07/26/24 1504 07/27/24 0334  WBC 7.4 6.2  HGB 6.7* 8.1*  HCT 21.4* 24.9*  MCV 95.1 91.5  PLT 193 192   Basic Metabolic Panel: Recent Labs  Lab 07/26/24 1504 07/27/24 0334  NA 143 145  K 4.7 4.4  CL 111 112*  CO2 25 27  GLUCOSE 122* 101*  BUN 31* 26*  CREATININE 1.20 1.18  CALCIUM  8.3* 8.4*   Liver Function Tests: Recent Labs  Lab 07/26/24 1504  AST 22  ALT 17  ALKPHOS 74  BILITOT  0.4  PROT 5.4*  ALBUMIN 3.0*   CBG: Recent Labs  Lab 07/26/24 1955 07/27/24 0742 07/27/24 1141  GLUCAP 222* 117* 100*    Discharge time spent: greater than 30 minutes.  Signed: Charlie Patterson, MD [facility] 07/27/2024

## 2024-07-27 NOTE — TOC Initial Note (Signed)
 Transition of Care [facility]) - Initial/Assessment Note    Patient Details  Name: [name] MRN: [id number] Date of Birth: 01/15/1971  [facility]) CM/SW Contact:    Dalia GORMAN Fuse, RN Phone Number: 07/27/2024, 11:35 AM  Clinical Narrative:                 Patient presented with Melana, GI consulted and plan for upper GI endoscopy today. No TOC needs, please outreach if needs identified.        Patient Goals and CMS Choice            Expected Discharge Plan and Services                                              Prior Living Arrangements/Services                       Activities of Daily Living   ADL Screening (condition at time of admission) Independently performs ADLs?: Yes (appropriate for developmental age) Is the patient deaf or have difficulty hearing?: No Does the patient have difficulty seeing, even when wearing glasses/contacts?: No Does the patient have difficulty concentrating, remembering, or making decisions?: No  Permission Sought/Granted                  Emotional Assessment              Admission diagnosis:  Melena [K92.1] Symptomatic anemia [D64.9] Gastrointestinal hemorrhage, unspecified gastrointestinal hemorrhage type [K92.2] Chronic bilateral low back pain with bilateral sciatica [M54.42, M54.41, G89.29] Patient Active Problem List   Diagnosis Date Noted   Ulcer 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 07/27/2024   Symptomatic anemia 07/26/2024   Upper GI bleed 07/26/2024   Obesity (BMI 30-39.9) 07/26/2024   Type 2 diabetes mellitus with complication, without long-term current use of insulin  (HCC) 07/26/2024   History of gout 07/26/2024   Melena 07/26/2024   S/P gastric bypass 12/20/2023   SBO (small bowel obstruction) (HCC) 12/20/2023   History of arthroscopic surgery of shoulder 11/26/2023   Chronic pain 06/17/2023   Polypharmacy 06/17/2023   Anxiety and depression 01/04/2023   Diabetic  polyneuropathy (HCC) 01/04/2023   B12 deficiency 06/11/2016   Allergic conjunctivitis 06/11/2016   Muscle cramps 06/11/2016   Dyslipidemia 11/02/2015   Acquired nystagmus 08/01/2015   Chronic bilateral low back pain with bilateral sciatica 06/29/2015   Carpal tunnel syndrome 06/29/2015   Osteoarthritis 06/29/2015   Claustrophobia 06/29/2015   Foot drop, left 06/29/2015   Hiatal hernia 06/29/2015   H/O testicular cancer 06/29/2015   Acquired hypothyroidism 06/29/2015   Allergic rhinitis 06/29/2015   Seborrheic keratoses 06/29/2015   Vitamin D  deficiency 06/29/2015   PCP:  Glenard Mire, MD Pharmacy:   [facility] [phone], [place] - [address] AT [facility] [address] [place] [address] [place] [address] Phone: [phone] Fax: [phone]     Social Drivers of Health (SDOH) Social History: SDOH Screenings   Food Insecurity: No Food Insecurity (07/26/2024)  Housing: Low Risk  (07/26/2024)  Transportation Needs: No Transportation Needs (07/26/2024)  Utilities: Not At Risk (07/26/2024)  Alcohol Screen: Low Risk  (07/24/2023)  Depression (PHQ2-9): Low Risk  (03/26/2024)  Financial Resource Strain: Low Risk  (07/24/2023)  Physical Activity: Inactive (07/24/2023)  Social Connections: Moderately Isolated (07/26/2024)  Stress: Stress Concern Present (07/24/2023)  Tobacco Use: High Risk (07/27/2024)  Health Literacy: Adequate Health Literacy (07/24/2023)   SDOH Interventions:     Readmission Risk Interventions     No data to display

## 2024-07-27 NOTE — Plan of Care (Signed)

## 2024-07-27 NOTE — Op Note (Signed)
 [facility] Gastroenterology Patient Name: [name] Procedure Date: 07/27/2024 10:04 AM MRN: [id number] Account #: [id number] Date of Birth: 22-Feb-1971 Admit Type: Ambulatory Age: 53 Room: [facility] ENDO ROOM 4 Gender: Male Note Status: Finalized Instrument Name: Upper Endoscope [id number] Procedure:             Upper GI endoscopy Indications:           Melena Providers:             Rogelia Copping MD, MD Referring MD:          Dorette FALCON. Sowles, MD (Referring MD) Medicines:             Propofol  per Anesthesia Complications:         No immediate complications. Procedure:             Pre-Anesthesia Assessment:                        - Prior to the procedure, a History and Physical was                         performed, and patient medications and allergies were                         reviewed. The patient's tolerance of previous                         anesthesia was also reviewed. The risks and benefits                         of the procedure and the sedation options and risks                         were discussed with the patient. All questions were                         answered, and informed consent was obtained. Prior                         Anticoagulants: The patient has taken no anticoagulant                         or antiplatelet agents except for NSAID medication.                         ASA Grade Assessment: II - A patient with mild                         systemic disease. After reviewing the risks and                         benefits, the patient was deemed in satisfactory                         condition to undergo the procedure.                        After obtaining informed consent, the endoscope was  passed under direct vision. Throughout the procedure,                         the patient's blood pressure, pulse, and oxygen                         saturations were monitored continuously. The Endoscope                          was introduced through the mouth, and advanced to the                         jejunum. The upper GI endoscopy was accomplished                         without difficulty. The patient tolerated the                         procedure well. Findings:      A small hiatal hernia was present.      One cratered esophageal ulcer with no bleeding and no stigmata of recent       bleeding was found at the gastroesophageal junction.      Evidence of a gastric bypass was found. A gastric pouch was found. The       gastrojejunal anastomosis was characterized by ulceration. This was       traversed.      The examined jejunum was normal. Impression:            - Small hiatal hernia.                        - Esophageal ulcer with no bleeding and no stigmata of                         recent bleeding.                        - Gastric bypass. Gastrojejunal anastomosis                         characterized by ulceration.                        - Normal examined jejunum.                        - No specimens collected. Recommendation:        - Return patient to hospital ward for ongoing care.                        - Resume regular diet.                        - Continue present medications.                        - With the clean base ulcers, the risk of rebleeding                         is low.  The patient should avoid NSAID's.                        8 weeks of a PPI should be given as an outpatient Procedure Code(s):     --- Professional ---                        [phone], Esophagogastroduodenoscopy, flexible,                         transoral; diagnostic, including collection of                         specimen(s) by brushing or washing, when performed                         (separate procedure) Diagnosis Code(s):     --- Professional ---                        K92.1, Melena (includes Hematochezia)                        K22.10, Ulcer 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 CPT  copyright 2022 American Medical Association. All rights reserved. The codes documented in this report are preliminary and upon coder review may  be revised to meet current compliance requirements. Rogelia Copping MD, MD 07/27/2024 10:36:13 AM This report has been signed electronically. Number of Addenda: 0 Note Initiated On: 07/27/2024 10:04 AM Estimated Blood Loss:  Estimated blood loss: none.      [facility]

## 2024-07-27 NOTE — Anesthesia Preprocedure Evaluation (Signed)
 Anesthesia Evaluation  Patient identified by MRN, date of birth, ID band Patient awake    Reviewed: Allergy & Precautions, H&P , NPO status , Patient's Chart, lab work & pertinent test results, reviewed documented beta blocker date and time   Airway Mallampati: II   Neck ROM: full    Dental  (+) Poor Dentition   Pulmonary sleep apnea and Continuous Positive Airway Pressure Ventilation , Current Smoker and Patient abstained from smoking.   Pulmonary exam normal        Cardiovascular Exercise Tolerance: Poor hypertension, On Medications negative cardio ROS Normal cardiovascular exam Rhythm:regular Rate:Normal     Neuro/Psych  PSYCHIATRIC DISORDERS Anxiety Depression     Neuromuscular disease    GI/Hepatic Neg liver ROS, hiatal hernia,,,  Endo/Other  diabetesHypothyroidism    Renal/GU negative Renal ROS  negative genitourinary   Musculoskeletal   Abdominal   Peds  Hematology  (+) Blood dyscrasia, anemia   Anesthesia Other Findings Past Medical History: No date: Allergy No date: Anxiety No date: Depression No date: Diabetes (HCC) No date: Gout No date: Hiatal hernia No date: Hyperlipidemia No date: Hypertension     Comment:  no meds now No date: Hypothyroidism No date: Metabolic syndrome No date: Obesity No date: OSA on CPAP     Comment:  no CPAP No date: Radiculopathy No date: Seborrhea No date: Testicular cancer (HCC) No date: Testicular hypofunction No date: Urine ketone No date: Vitamin D  deficiency Past Surgical History: No date: BACK SURGERY     Comment:  X 2 No date: HERNIA REPAIR     Comment:  left side and umbilical hernia repair 12/20/2023: LAPAROSCOPY ABDOMEN DIAGNOSTIC 02/20/2012: ROUX-EN-Y GASTRIC BYPASS 03/09/2024: SHOULDER ARTHROSCOPY; Right     Comment:  Procedure: ARTHROSCOPY SHOULDER/DEBRIDEMENT;  Surgeon:               Josefina Chew, MD;  Location: [facility];               Service: Orthopedics;  Laterality: Right; 01/02/2023: SHOULDER ARTHROSCOPY W/ ROTATOR CUFF REPAIR; Left 03/09/2024: SHOULDER ARTHROSCOPY WITH DISTAL CLAVICLE RESECTION; Right     Comment:  Procedure: SHOULDER ARTHROSCOPY WITH DISTAL CLAVICLE               EXCISION;  Surgeon: Josefina Chew, MD;  Location: [facility];  Service: Orthopedics;  Laterality:               Right; 03/09/2024: SHOULDER ARTHROSCOPY WITH ROTATOR CUFF REPAIR AND  SUBACROMIAL DECOMPRESSION; Right     Comment:  Procedure: SHOULDER ARTHROSCOPY WITH ROTATOR CUFF REPAIR              AND SUBACROMIAL DECOMPRESSION;  Surgeon: Josefina Chew,               MD;  Location:  SURGERY CENTER;  Service:               Orthopedics;  Laterality: Right; 12/23/1998: URETEROTOMY; Left BMI    Body Mass Index: 39.87 kg/m     Reproductive/Obstetrics negative OB ROS                              Anesthesia Physical Anesthesia Plan  ASA: 3  Anesthesia Plan: General   Post-op Pain Management:    Induction:   PONV Risk Score and Plan:   Airway Management  Planned:   Additional Equipment:   Intra-op Plan:   Post-operative Plan:   Informed Consent: I have reviewed the patients History and Physical, chart, labs and discussed the procedure including the risks, benefits and alternatives for the proposed anesthesia with the patient or authorized representative who has indicated his/her understanding and acceptance.     Dental Advisory Given  Plan Discussed with: CRNA  Anesthesia Plan Comments:         Anesthesia Quick Evaluation

## 2024-07-27 NOTE — Care Management CC44 (Cosign Needed)
 Condition Code 44 Documentation Completed  Patient Details  Name: [name] MRN: [id number] Date of Birth: 07-Sep-1971   Condition Code 44 given:    Patient signature on Condition Code 44 notice:    Documentation of 2 MD's agreement:    Code 44 added to claim:       Dalia GORMAN Fuse, RN 07/27/2024, 2:18 PM

## 2024-07-27 NOTE — Plan of Care (Signed)

## 2024-07-27 NOTE — Hospital Course (Addendum)
 53 y.o. male with medical history significant of hypertension, hyperlipidemia, diabetes, testicular cancer, left foot drop, hypothyroidism, depression and Roux-en-Y gastric bypass.  He is presenting to the hospital with symptoms starting from Saturday with shortness of breath, low energy, lightheadedness, dizziness.  He has noticed black stools twice a day since Saturday described as tarry.  Wife has noticed his color is not right.  In the ER found to be anemic with a hemoglobin of 6.7.  Hospitalist services were contacted for further evaluation.  ER team ordered transfusion and consulted gastroenterology.  Patient will be made n.p.o. after midnight for endoscopy.  Patient started on Protonix .   8/5.  Patient's hemoglobin 8.1 after transfusion 2 units of packed red blood cells.  Will also give IV iron  with ferritin of 27.  Endoscopy showing a esophageal ulcer and anastomosis ulcer both of which were nonbleeding.  No further melena.  Gastroenterology cleared to go home.  Will refer to hematology as outpatient.

## 2024-07-27 NOTE — Consult Note (Signed)
 Rogelia Copping, MD [facility]  [address] [place], [place] [address] Phone: [phone] Fax : [phone]  Consultation  Referring Provider:     Melena Primary Care Physician:  Sowles, Krichna, MD Primary Gastroenterologist: Sampson         Reason for Consultation:     Melena  Date of Admission:  07/26/2024 Date of Consultation:  07/27/2024         HPI:   [name] is a 53 y.o. male who came to the emergency department with some shortness of breath and was found to have symptomatic anemia with a drop in hemoglobin.  The patient is status post gastric bypass at [facility] 15 years ago and he reports that he did lost significant amount of weight.  The patient has a history of diabetes hypertension and obesity.  He also has a history of hypothyroidism and on CPAP.  Besides the shortness of breath he also reports that he was dizzy at the time of deciding to come to the emergency department.  He reports he was having multiple bowel movements with black stools for 3 days prior to coming to the hospital but has not had any further black stools since yesterday. The patient reports that he has been taking BC powders for many years twice a day on average.  He denies any alcohol abuse or use.  The patient denies any abdominal pain associated with his black stools.  He reports that he has never had an EGD or colonoscopy but believes he had a Cologuard test about a year ago.  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   SHOULDER ARTHROSCOPY Right 03/09/2024   Procedure: ARTHROSCOPY SHOULDER/DEBRIDEMENT;  Surgeon: Josefina Chew, MD;  Location: [facility];  Service: Orthopedics;  Laterality: Right;   SHOULDER ARTHROSCOPY W/ ROTATOR CUFF REPAIR Left 01/02/2023   SHOULDER ARTHROSCOPY WITH DISTAL CLAVICLE RESECTION Right 03/09/2024   Procedure: SHOULDER ARTHROSCOPY WITH DISTAL CLAVICLE EXCISION;  Surgeon: Josefina Chew, MD;  Location: [facility];  Service: Orthopedics;  Laterality: Right;   SHOULDER ARTHROSCOPY WITH ROTATOR CUFF REPAIR AND SUBACROMIAL DECOMPRESSION Right 03/09/2024   Procedure: SHOULDER ARTHROSCOPY WITH ROTATOR CUFF REPAIR AND SUBACROMIAL DECOMPRESSION;  Surgeon: Josefina Chew, MD;  Location: [facility];  Service: Orthopedics;  Laterality: Right;   URETEROTOMY Left 12/23/1998    Prior to Admission medications   Medication Sig Start Date End Date Taking? Authorizing Provider  albuterol  (VENTOLIN  HFA) 108 (90 Base) MCG/ACT inhaler Inhale 2 puffs into the lungs every 6 (six) hours as needed for wheezing or shortness of breath. 01/06/23  Yes Patel, Sona, MD  ALLERGY RELIEF 180 MG tablet  TAKE 1 TABLET(180 MG) BY MOUTH DAILY 11/14/21  Yes Sowles, Krichna, MD  allopurinol  (ZYLOPRIM ) 100 MG tablet Take 1 tablet (100 mg total) by mouth 2 (two) times daily. 11/26/23  Yes Sowles, Krichna, MD  busPIRone  (BUSPAR ) 5 MG tablet TAKE 1 TO 2 TABLETS BY MOUTH THREE TIMES DAILY 07/15/24  Yes Sowles, Krichna, MD  cyclobenzaprine  (FLEXERIL ) 10 MG tablet Take 1 tablet (10 mg total) by mouth 3 (three) times daily as needed for muscle spasms. TAKE 1 TABLET BY MOUTH AT BEDTIME 03/09/24  Yes Brown, Blaine K, PA-C  DULoxetine  (CYMBALTA ) 60 MG capsule TAKE 1 CAPSULE(60 MG) BY MOUTH DAILY 07/21/24  Yes Sowles, Krichna, MD  fluticasone  (FLONASE ) 50 MCG/ACT nasal spray Place 2 sprays into both nostrils  daily. 03/26/24  Yes Sowles, Krichna, MD  levothyroxine  (SYNTHROID ) 88 MCG tablet TAKE 1 TABLET(88 MCG) BY MOUTH DAILY BEFORE BREAKFAST 07/01/24  Yes Sowles, Krichna, MD  metFORMIN  (GLUCOPHAGE -XR) 750 MG 24 hr tablet Take 750 mg by mouth daily. 04/24/24  Yes [provider]  MOUNJARO  12.5 MG/0.5ML Pen ADMINISTER 12.5 MG UNDER THE SKIN 1 TIME A WEEK 04/23/24  Yes Sowles, Krichna, MD  pregabalin  (LYRICA ) 225 MG capsule TAKE 1 CAPSULE(225 MG) BY MOUTH TWICE DAILY 07/26/24  Yes Sowles, Krichna, MD  QUEtiapine  (SEROQUEL ) 25 MG tablet Take 1 tablet (25 mg total) by mouth at bedtime. 11/26/23  Yes Sowles, Krichna, MD  rosuvastatin  (CRESTOR ) 5 MG tablet TAKE 1 TABLET(5 MG) BY MOUTH DAILY 07/21/24  Yes Sowles, Krichna, MD  traZODone  (DESYREL ) 100 MG tablet TAKE 2 TABLETS BY MOUTH AT BEDTIME 07/21/24  Yes Sowles, Krichna, MD  Accu-Chek Softclix Lancets lancets 1 each 3 (three) times daily. 03/17/24   [provider]  Blood Glucose Monitoring Suppl (ACCU-CHEK GUIDE ME) w/Device KIT as directed. 03/17/24   [provider]  glucose blood (ACCU-CHEK GUIDE TEST) test strip USE ONE STRIP DAILY 06/07/24   Sowles, Krichna, MD    Family History  Problem Relation Age of Onset   Coronary artery disease Mother    Cancer Father        Melanoma and Testicular   Diabetes Father      Social History   Tobacco Use   Smoking status: Every Day    Types: Cigars   Smokeless tobacco: Former    Types: Chew    Quit date: 12/24/1991  Substance Use Topics   Alcohol use: No    Alcohol/week: 0.0 standard drinks of alcohol   Drug use: No    Allergies as of 07/26/2024 - Review Complete 07/26/2024  Allergen Reaction Noted   Morphine Itching 06/29/2015    Review of Systems:    All systems reviewed and negative except where noted in HPI.   Physical Exam:  Vital signs in last 24 hours: Temp:  [97.3 F (36.3 C)-98.2 F (36.8 C)] 97.3 F (36.3 C) (08/05 0746) Pulse Rate:  [51-136] 61 (08/05 0746) Resp:   [16-20] 17 (08/05 0746) BP: (90-129)/(53-91) 118/87 (08/05 0746) SpO2:  [96 %-100 %] 96 % (08/05 0746) Weight:  [122.5 kg] 122.5 kg (08/04 1501) Last BM Date : 07/26/24 General:   Pleasant, cooperative in NAD Head:  Normocephalic and atraumatic. Eyes:   No icterus.   Conjunctiva pink. PERRLA. Ears:  Normal auditory acuity. Neck:  Supple; no masses or thyroidomegaly Lungs: Respirations even and unlabored. Lungs clear to auscultation bilaterally.   No wheezes, crackles, or rhonchi.  Heart:  Regular rate and rhythm;  Without murmur, clicks, rubs or gallops Abdomen:  Soft, nondistended, nontender. Normal bowel sounds. No appreciable masses or hepatomegaly.  No rebound or guarding.  Rectal:  Not performed. Msk:  Symmetrical without gross deformities.   Extremities:  Without edema, cyanosis or clubbing. Neurologic:  Alert and oriented x3;  grossly normal neurologically. Skin:  Intact without significant lesions or rashes. Cervical Nodes:  No significant cervical adenopathy. Psych:  Alert and cooperative. Normal affect.  LAB RESULTS: Recent Labs    07/26/24 1504 07/27/24 0334  WBC 7.4 6.2  HGB 6.7* 8.1*  HCT 21.4* 24.9*  PLT 193 192   BMET Recent Labs    07/26/24 1504 07/27/24 0334  NA 143 145  K 4.7 4.4  CL 111 112*  CO2 25 27  GLUCOSE 122* 101*  BUN 31* 26*  CREATININE 1.20 1.18  CALCIUM  8.3* 8.4*   LFT Recent Labs    07/26/24 1504  PROT 5.4*  ALBUMIN 3.0*  AST 22  ALT 17  ALKPHOS 74  BILITOT 0.4  BILIDIR <0.1  IBILI NOT CALCULATED   PT/INR Recent Labs    07/26/24 1649  LABPROT 13.5  INR 1.0    STUDIES: DG Chest 2 View Result Date: 07/26/2024 CLINICAL DATA:  Shortness of breath EXAM: CHEST - 2 VIEW COMPARISON:  Chest radiograph dated 09/22/2023 FINDINGS: Normal lung volumes. No focal consolidations. No pleural effusion or pneumothorax. Enlarged cardiomediastinal silhouette. No acute osseous abnormality. IMPRESSION: 1. No acute cardiopulmonary process. 2.  Enlarged cardiomediastinal silhouette. Electronically Signed   By: Limin  Xu M.D.   On: 07/26/2024 15:44      Impression / Plan:   Assessment: Principal Problem:   Symptomatic anemia Active Problems:   Anxiety and depression   Diabetic polyneuropathy (HCC)   Upper GI bleed   Obesity (BMI 30-39.9)   Type 2 diabetes mellitus with complication, without long-term current use of insulin  (HCC)   History of gout   Melena   [name] is a 53 y.o. y/o male with with a history of NSAID use daily for many years with a history of a Roux-en-Y gastric bypass and now with melena.  The patient had symptomatic anemia with a hemoglobin of 6.7 on admission.  4 months ago the hemoglobin was 12.8 and after transfusion the hemoglobin this morning was 8.1.  Plan:  The patient likely had an upper GI bleed with melanotic stools resulting in a low hemoglobin and symptomatic anemia.  The patient has been kept n.p.o. and will be set up for an EGD for today.  The patient has been explained the plan and agrees with it.  The patient has also been told that he should avoid NSAIDs due to his gastric bypass and high risk of the patient having recurrent GI bleeds if he continues to take NSAIDs.  Thank you for involving me in the care of this patient.      LOS: 1 day   Rogelia Copping, MD, MD. NOLIA 07/27/2024, 8:21 AM,  Pager [phone] 7am-5pm  Check AMION for 5pm -7am coverage and on weekends   Note: This dictation was prepared with Dragon dictation along with smaller phrase technology. Any transcriptional errors that result from this process are unintentional.

## 2024-07-27 NOTE — Transfer of Care (Signed)
 Immediate Anesthesia Transfer of Care Note  Patient: [name]  Procedure(s) Performed: EGD (ESOPHAGOGASTRODUODENOSCOPY)  Patient Location: PACU  Anesthesia Type:General  Level of Consciousness: sedated  Airway & Oxygen Therapy: Patient Spontanous Breathing  Post-op Assessment: Report given to RN and Post -op Vital signs reviewed and stable  Post vital signs: Reviewed and stable  Last Vitals:  Vitals Value Taken Time  BP    Temp    Pulse 71 07/27/24 10:32  Resp 15 07/27/24 10:32  SpO2 98 % 07/27/24 10:32  Vitals shown include unfiled device data.  Last Pain:  Vitals:   07/27/24 0928  TempSrc: Temporal  PainSc: 0-No pain      Patients Stated Pain Goal: 0 (07/27/24 0229)  Complications: No notable events documented.

## 2024-07-29 ENCOUNTER — Ambulatory Visit: Payer: Medicare Other

## 2024-07-29 DIAGNOSIS — Z0000 Encounter for general adult medical examination without abnormal findings: Secondary | ICD-10-CM | POA: Diagnosis not present

## 2024-07-29 NOTE — Patient Instructions (Signed)
 Mr. [name] , Thank you for taking time out of your busy schedule to complete your Annual Wellness Visit with me. I enjoyed our conversation and look forward to speaking with you again next year. I, as well as your care team,  appreciate your ongoing commitment to your health goals. Please review the following plan we discussed and let me know if I can assist you in the future.  Follow up Visits: 08/04/25 @ 10:50 AM BY PHONE We will see or speak with you next year for your Next Medicare AWV with our clinical staff Have you seen your provider in the last 6 months (3 months if uncontrolled diabetes)? Yes  Clinician Recommendations:  Aim for 30 minutes of exercise or brisk walking, 6-8 glasses of water, and 5 servings of fruits and vegetables each day. TAKE CARE!      This is a list of the screenings recommended for you:  Health Maintenance  Topic Date Due   Hepatitis B Vaccine (1 of 3 - 19+ 3-dose series) Never done   Zoster (Shingles) Vaccine (1 of 2) Never done   COVID-19 Vaccine (3 - Pfizer risk series) 05/08/2020   Pneumococcal Vaccine for high risk medical condition (3 of 3 - PCV20 or PCV21) 02/18/2023   Pneumococcal Vaccine for age over 56 (3 of 3 - PCV20 or PCV21) 02/18/2023   Eye exam for diabetics  10/09/2023   Flu Shot  07/23/2024   Complete foot exam   07/28/2024   Hemoglobin A1C  09/25/2024   Yearly kidney health urinalysis for diabetes  11/25/2024   Cologuard (Stool DNA test)  07/02/2025   Yearly kidney function blood test for diabetes  07/27/2025   Medicare Annual Wellness Visit  07/29/2025   DTaP/Tdap/Td vaccine (4 - Td or Tdap) 03/06/2033   Hepatitis C Screening  Completed   HIV Screening  Completed   HPV Vaccine  Aged Out   Meningitis B Vaccine  Aged Out   Screening for Lung Cancer  Discontinued    Advanced directives: (ACP Link)Information on Advanced Care Planning can be found at [place]  Secretary of [facility] Advance Health Care Directives Advance Health Care  Directives. [email or web address]  Advance Care Planning is important because it:  [x]  Makes sure you receive the medical care that is consistent with your values, goals, and preferences  [x]  It provides guidance to your family and loved ones and reduces their decisional burden about whether or not they are making the right decisions based on your wishes.  Follow the link provided in your after visit summary or read over the paperwork we have mailed to you to help you started getting your Advance Directives in place. If you need assistance in completing these, please reach out to us  so that we can help you!

## 2024-07-29 NOTE — Progress Notes (Signed)
 Subjective:   [name] is a 53 y.o. who presents for a Medicare Wellness preventive visit.  As a reminder, Annual Wellness Visits don't include a physical exam, and some assessments may be limited, especially if this visit is performed virtually. We may recommend an in-person follow-up visit with your provider if needed.  Visit Complete: Virtual I connected with  [name] on 07/29/24 by a audio enabled telemedicine application and verified that I am speaking with the correct person using two identifiers.  Patient Location: Home  Provider Location: Home Office  I discussed the limitations of evaluation and management by telemedicine. The patient expressed understanding and agreed to proceed.  Vital Signs: Because this visit was a virtual/telehealth visit, some criteria may be missing or patient reported. Any vitals not documented were not able to be obtained and vitals that have been documented are patient reported.  VideoDeclined- This patient declined Librarian, academic. Therefore the visit was completed with audio only.  Persons Participating in Visit: Patient.  AWV Questionnaire: No: Patient Medicare AWV questionnaire was not completed prior to this visit.  Cardiac Risk Factors include: diabetes mellitus;dyslipidemia;male gender;sedentary lifestyle;obesity (BMI >30kg/m2);smoking/ tobacco exposure     Objective:    Today's Vitals   07/29/24 1317  PainSc: 0-No pain   There is no height or weight on file to calculate BMI.     07/29/2024    1:22 PM 07/27/2024    9:27 AM 07/26/2024    6:12 PM 07/26/2024    3:02 PM 03/09/2024    2:42 PM 03/09/2024    7:56 AM 03/06/2024    8:21 AM  Advanced Directives  Does Patient Have a Medical Advance Directive? No No  No No No No  Would patient like information on creating a medical advance directive? No - Patient declined No - Patient declined No - Patient declined   No - Patient declined      Current Medications (verified) Outpatient Encounter Medications as of 07/29/2024  Medication Sig   Accu-Chek Softclix Lancets lancets 1 each 3 (three) times daily.   acetaminophen  (TYLENOL ) 325 MG tablet Take 2 tablets (650 mg total) by mouth every 6 (six) hours as needed for mild pain (pain score 1-3) or fever (or Fever >/= 101).   albuterol  (VENTOLIN  HFA) 108 (90 Base) MCG/ACT inhaler Inhale 2 puffs into the lungs every 6 (six) hours as needed for wheezing or shortness of breath.   allopurinol  (ZYLOPRIM ) 100 MG tablet Take 1 tablet (100 mg total) by mouth 2 (two) times daily.   Blood Glucose Monitoring Suppl (ACCU-CHEK GUIDE ME) w/Device KIT as directed.   busPIRone  (BUSPAR ) 5 MG tablet TAKE 1 TO 2 TABLETS BY MOUTH THREE TIMES DAILY   cyclobenzaprine  (FLEXERIL ) 10 MG tablet Take 1 tablet (10 mg total) by mouth 3 (three) times daily as needed for muscle spasms. TAKE 1 TABLET BY MOUTH AT BEDTIME   DULoxetine  (CYMBALTA ) 60 MG capsule TAKE 1 CAPSULE(60 MG) BY MOUTH DAILY   fluticasone  (FLONASE ) 50 MCG/ACT nasal spray Place 2 sprays into both nostrils daily.   glucose blood (ACCU-CHEK GUIDE TEST) test strip USE ONE STRIP DAILY   levothyroxine  (SYNTHROID ) 88 MCG tablet TAKE 1 TABLET(88 MCG) BY MOUTH DAILY BEFORE BREAKFAST   metFORMIN  (GLUCOPHAGE -XR) 750 MG 24 hr tablet Take 750 mg by mouth daily.   MOUNJARO  12.5 MG/0.5ML Pen ADMINISTER 12.5 MG UNDER THE SKIN 1 TIME A WEEK   pantoprazole  (PROTONIX ) 40 MG tablet Take 1 tablet (40 mg  total) by mouth daily.   pregabalin  (LYRICA ) 225 MG capsule TAKE 1 CAPSULE(225 MG) BY MOUTH TWICE DAILY   QUEtiapine  (SEROQUEL ) 25 MG tablet Take 1 tablet (25 mg total) by mouth at bedtime.   rosuvastatin  (CRESTOR ) 5 MG tablet TAKE 1 TABLET(5 MG) BY MOUTH DAILY   traZODone  (DESYREL ) 100 MG tablet TAKE 2 TABLETS BY MOUTH AT BEDTIME   No facility-administered encounter medications on file as of 07/29/2024.    Allergies (verified) Morphine   History: Past Medical  History:  Diagnosis Date   Allergy    Anxiety    Depression    Diabetes (HCC)    Gout    Hiatal hernia    Hyperlipidemia    Hypertension    no meds now   Hypothyroidism    Metabolic syndrome    Obesity    OSA on CPAP    no CPAP   Radiculopathy    Seborrhea    Testicular cancer (HCC)    Testicular hypofunction    Urine ketone    Vitamin D  deficiency    Past Surgical History:  Procedure Laterality Date   BACK SURGERY     X 2   ESOPHAGOGASTRODUODENOSCOPY N/A 07/27/2024   Procedure: EGD (ESOPHAGOGASTRODUODENOSCOPY);  Surgeon: Jinny Carmine, MD;  Location: [facility] ENDOSCOPY;  Service: Endoscopy;  Laterality: N/A;   HERNIA REPAIR     left side and umbilical hernia repair   LAPAROSCOPY ABDOMEN DIAGNOSTIC  12/20/2023   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   SHOULDER ARTHROSCOPY Right 03/09/2024   Procedure: ARTHROSCOPY SHOULDER/DEBRIDEMENT;  Surgeon: Josefina Chew, MD;  Location: [facility];  Service: Orthopedics;  Laterality: Right;   SHOULDER ARTHROSCOPY W/ ROTATOR CUFF REPAIR Left 01/02/2023   SHOULDER ARTHROSCOPY WITH DISTAL CLAVICLE RESECTION Right 03/09/2024   Procedure: SHOULDER ARTHROSCOPY WITH DISTAL CLAVICLE EXCISION;  Surgeon: Josefina Chew, MD;  Location: [facility];  Service: Orthopedics;  Laterality: Right;   SHOULDER ARTHROSCOPY WITH ROTATOR CUFF REPAIR AND SUBACROMIAL DECOMPRESSION Right 03/09/2024   Procedure: SHOULDER ARTHROSCOPY WITH ROTATOR CUFF REPAIR AND SUBACROMIAL DECOMPRESSION;  Surgeon: Josefina Chew, MD;  Location:  SURGERY CENTER;  Service: Orthopedics;  Laterality: Right;   URETEROTOMY Left 12/23/1998   Family History  Problem Relation Age of Onset   Coronary artery disease Mother    Cancer Father        Melanoma and Testicular   Diabetes Father    Social History   Socioeconomic History   Marital status: Married    Spouse name: Not on file   Number of children: 2   Years of education: Not on file   Highest  education level: Some college, no degree  Occupational History   Occupation: grave digger     Comment: [employer]   Tobacco Use   Smoking status: Every Day    Types: Cigars   Smokeless tobacco: Former    Types: Chew    Quit date: 12/24/1991  Vaping Use   Vaping status: Never Used  Substance and Sexual Activity   Alcohol use: No    Alcohol/week: 0.0 standard drinks of alcohol   Drug use: No   Sexual activity: Yes    Partners: Female  Other Topics Concern   Not on file  Social History Narrative   Married, lives with wife, both children and grandchildren   Social Drivers of Health   Financial Resource Strain: Low Risk  (07/29/2024)   Overall Financial Resource Strain (CARDIA)    Difficulty of Paying Living Expenses:  Not hard at all  Food Insecurity: No Food Insecurity (07/29/2024)   Hunger Vital Sign    Worried About Running Out of Food in the Last Year: Never true    Ran Out of Food in the Last Year: Never true  Transportation Needs: No Transportation Needs (07/29/2024)   PRAPARE - Administrator, Civil Service (Medical): No    Lack of Transportation (Non-Medical): No  Physical Activity: Insufficiently Active (07/29/2024)   Exercise Vital Sign    Days of Exercise per Week: 2 days    Minutes of Exercise per Session: 30 min  Stress: No Stress Concern Present (07/29/2024)   [employer] of Occupational Health - Occupational Stress Questionnaire    Feeling of Stress: Not at all  Social Connections: Moderately Isolated (07/29/2024)   Social Connection and Isolation Panel    Frequency of Communication with Friends and Family: More than three times a week    Frequency of Social Gatherings with Friends and Family: More than three times a week    Attends Religious Services: Never    Database administrator or Organizations: No    Attends Engineer, structural: Never    Marital Status: Married    Tobacco Counseling Ready to quit: Not Answered Counseling  given: Not Answered    Clinical Intake:  Pre-visit preparation completed: Yes  Pain : No/denies pain Pain Score: 0-No pain     BMI - recorded: 37.8 Nutritional Status: BMI > 30  Obese Nutritional Risks: None Diabetes: Yes CBG done?: No Did pt. bring in CBG monitor from home?: No  Lab Results  Component Value Date   HGBA1C 5.7 (A) 03/26/2024   HGBA1C 5.7 (A) 11/26/2023   HGBA1C 6.1 (A) 07/29/2023     How often do you need to have someone help you when you read instructions, pamphlets, or other written materials from your doctor or pharmacy?: 1 - Never  Interpreter Needed?: No  Information entered by :: JHONNIE DAS, LPN   Activities of Daily Living     07/29/2024    1:23 PM 07/26/2024    6:10 PM  In your present state of health, do you have any difficulty performing the following activities:  Hearing? 0   Vision? 0   Difficulty concentrating or making decisions? 0   Walking or climbing stairs? 1   Dressing or bathing? 0   Doing errands, shopping? 0 0  Preparing Food and eating ? N   Using the Toilet? N   In the past six months, have you accidently leaked urine? N   Do you have problems with loss of bowel control? N   Managing your Medications? N   Managing your Finances? N   Housekeeping or managing your Housekeeping? N     Patient Care Team: Sowles, Krichna, MD as PCP - General (Family Medicine) [facility], [facility] (Optometry)  I have updated your Care Teams any recent Medical Services you may have received from other providers in the past year.     Assessment:   This is a routine wellness examination for William.  Hearing/Vision screen Hearing Screening - Comments:: NO AIDS Vision Screening - Comments:: [facility]ERS- [facility]- LAST APPT ONE YEAR AGO   Goals Addressed             This Visit's Progress    DIET - EAT MORE FRUITS AND VEGETABLES         Depression Screen     07/29/2024    1:20  PM 03/26/2024    9:03 AM 02/06/2024    7:52 AM  11/26/2023    9:06 AM 09/05/2023    8:20 AM 07/29/2023    8:13 AM 07/24/2023    2:45 PM  PHQ 2/9 Scores  PHQ - 2 Score 0 0 0 0 0 0 0  PHQ- 9 Score 0 0 0 0 0 0     Fall Risk     07/29/2024    1:23 PM 02/06/2024    7:52 AM 11/26/2023    9:06 AM 09/05/2023    8:20 AM 07/29/2023    8:13 AM  Fall Risk   Falls in the past year? 1 0 0 1 1  Number falls in past yr: 0 0 0 1 1  Injury with Fall? 0 0 0 1 1  Risk for fall due to : History of fall(s) No Fall Risks No Fall Risks History of fall(s) History of fall(s);Orthopedic patient  Follow up Falls evaluation completed;Falls prevention discussed Falls prevention discussed;Education provided;Falls evaluation completed Falls prevention discussed;Education provided;Falls evaluation completed Falls prevention discussed Falls prevention discussed;Education provided;Falls evaluation completed    MEDICARE RISK AT HOME:  Medicare Risk at Home Any stairs in or around the home?: Yes If so, are there any without handrails?: No Home free of loose throw rugs in walkways, pet beds, electrical cords, etc?: Yes Adequate lighting in your home to reduce risk of falls?: Yes Life alert?: No Use of a cane, walker or w/c?: No Grab bars in the bathroom?: Yes Shower chair or bench in shower?: No Elevated toilet seat or a handicapped toilet?: Yes  TIMED UP AND GO:  Was the test performed?  No  Cognitive Function: 6CIT completed        07/29/2024    1:25 PM 07/24/2023    2:53 PM  6CIT Screen  What Year? 0 points 0 points  What month? 0 points 0 points  What time? 0 points 0 points  Count back from 20 0 points 0 points  Months in reverse 4 points 4 points  Repeat phrase 8 points 6 points  Total Score 12 points 10 points    Immunizations Immunization History  Administered Date(s) Administered   Influenza, Seasonal, Injecte, Preservative Fre 09/05/2023   Influenza,inj,Quad PF,6+ Mos 08/15/2017, 08/21/2018, 09/16/2019, 10/02/2020, 09/12/2022    Influenza-Unspecified 08/19/2014   PFIZER(Purple Top)SARS-COV-2 Vaccination 03/10/2020, 04/10/2020   Pneumococcal Conjugate-13 08/15/2017   Pneumococcal Polysaccharide-23 02/20/2010, 02/18/2018   Tdap 06/29/2008, 11/18/2018, 03/07/2023    Screening Tests Health Maintenance  Topic Date Due   Hepatitis B Vaccines (1 of 3 - 19+ 3-dose series) Never done   Zoster Vaccines- Shingrix  (1 of 2) Never done   COVID-19 Vaccine (3 - Pfizer risk series) 05/08/2020   Pneumococcal Vaccine: 19-49 Years (3 of 3 - PCV20 or PCV21) 02/18/2023   Pneumococcal Vaccine: 50+ Years (3 of 3 - PCV20 or PCV21) 02/18/2023   OPHTHALMOLOGY EXAM  10/09/2023   INFLUENZA VACCINE  07/23/2024   FOOT EXAM  07/28/2024   HEMOGLOBIN A1C  09/25/2024   Diabetic kidney evaluation - Urine ACR  11/25/2024   Fecal DNA (Cologuard)  07/02/2025   Diabetic kidney evaluation - eGFR measurement  07/27/2025   Medicare Annual Wellness (AWV)  07/29/2025   DTaP/Tdap/Td (4 - Td or Tdap) 03/06/2033   Hepatitis C Screening  Completed   HIV Screening  Completed   HPV VACCINES  Aged Out   Meningococcal B Vaccine  Aged Out   Lung Cancer Screening  Discontinued  Health Maintenance  Health Maintenance Due  Topic Date Due   Hepatitis B Vaccines (1 of 3 - 19+ 3-dose series) Never done   Zoster Vaccines- Shingrix  (1 of 2) Never done   COVID-19 Vaccine (3 - Pfizer risk series) 05/08/2020   Pneumococcal Vaccine: 19-49 Years (3 of 3 - PCV20 or PCV21) 02/18/2023   Pneumococcal Vaccine: 50+ Years (3 of 3 - PCV20 or PCV21) 02/18/2023   OPHTHALMOLOGY EXAM  10/09/2023   INFLUENZA VACCINE  07/23/2024   FOOT EXAM  07/28/2024   Health Maintenance Items Addressed: NEED COVID, SHINGRIX  & PNA SHOTS; UP TO DATE ON COLOGUARD   Additional Screening:  Vision Screening: Recommended annual ophthalmology exams for early detection of glaucoma and other disorders of the eye. Would you like a referral to an eye doctor? No    Dental Screening:  Recommended annual dental exams for proper oral hygiene  Community Resource Referral / Chronic Care Management: CRR required this visit?  No   CCM required this visit?  No   Plan:    I have personally reviewed and noted the following in the patient's chart:   Medical and social history Use of alcohol, tobacco or illicit drugs  Current medications and supplements including opioid prescriptions. Patient is not currently taking opioid prescriptions. Functional ability and status Nutritional status Physical activity Advanced directives List of other physicians Hospitalizations, surgeries, and ER visits in previous 12 months Vitals Screenings to include cognitive, depression, and falls Referrals and appointments  In addition, I have reviewed and discussed with patient certain preventive protocols, quality metrics, and best practice recommendations. A written personalized care plan for preventive services as well as general preventive health recommendations were provided to patient.   Jhonnie GORMAN Das, LPN   12/24/7972   After Visit Summary: (MyChart) Due to this being a telephonic visit, the after visit summary with patients personalized plan was offered to patient via MyChart   Notes: Nothing significant to report at this time.

## 2024-08-02 NOTE — Anesthesia Postprocedure Evaluation (Signed)
 Anesthesia Post Note  Patient: [name]  Procedure(s) Performed: EGD (ESOPHAGOGASTRODUODENOSCOPY)  Patient location during evaluation: PACU Anesthesia Type: General Level of consciousness: awake and alert Pain management: pain level controlled Vital Signs Assessment: post-procedure vital signs reviewed and stable Respiratory status: spontaneous breathing, nonlabored ventilation, respiratory function stable and patient connected to nasal cannula oxygen Cardiovascular status: blood pressure returned to baseline and stable Postop Assessment: no apparent nausea or vomiting Anesthetic complications: no   No notable events documented.   Last Vitals:  Vitals:   07/27/24 1038 07/27/24 1124  BP:  111/81  Pulse:  60  Resp:  16  Temp: (!) 35.6 C 37.1 C  SpO2:  99%    Last Pain:  Vitals:   07/27/24 1124  TempSrc: Oral  PainSc:                  Lynwood KANDICE Clause

## 2024-08-03 ENCOUNTER — Inpatient Hospital Stay: Admitting: Family Medicine

## 2024-08-03 ENCOUNTER — Ambulatory Visit: Admitting: Family Medicine

## 2024-08-06 ENCOUNTER — Other Ambulatory Visit: Payer: Self-pay | Admitting: Family Medicine

## 2024-08-06 DIAGNOSIS — F339 Major depressive disorder, recurrent, unspecified: Secondary | ICD-10-CM

## 2024-08-18 ENCOUNTER — Inpatient Hospital Stay

## 2024-08-18 ENCOUNTER — Encounter: Payer: Self-pay | Admitting: Oncology

## 2024-08-18 ENCOUNTER — Inpatient Hospital Stay: Attending: Oncology | Admitting: Oncology

## 2024-08-18 VITALS — BP 135/100 | HR 82 | Temp 96.6°F | Resp 18 | Ht 69.0 in | Wt 254.8 lb

## 2024-08-18 DIAGNOSIS — Z79899 Other long term (current) drug therapy: Secondary | ICD-10-CM | POA: Diagnosis not present

## 2024-08-18 DIAGNOSIS — D509 Iron deficiency anemia, unspecified: Secondary | ICD-10-CM | POA: Diagnosis present

## 2024-08-18 DIAGNOSIS — D508 Other iron deficiency anemias: Secondary | ICD-10-CM | POA: Diagnosis not present

## 2024-08-18 DIAGNOSIS — D500 Iron deficiency anemia secondary to blood loss (chronic): Secondary | ICD-10-CM

## 2024-08-18 DIAGNOSIS — D649 Anemia, unspecified: Secondary | ICD-10-CM

## 2024-08-18 LAB — CBC WITH DIFFERENTIAL (CANCER CENTER ONLY)
Abs Immature Granulocytes: 0.03 K/uL (ref 0.00–0.07)
Basophils Absolute: 0 K/uL (ref 0.0–0.1)
Basophils Relative: 1 %
Eosinophils Absolute: 0.2 K/uL (ref 0.0–0.5)
Eosinophils Relative: 3 %
HCT: 36 % — ABNORMAL LOW (ref 39.0–52.0)
Hemoglobin: 11.9 g/dL — ABNORMAL LOW (ref 13.0–17.0)
Immature Granulocytes: 1 %
Lymphocytes Relative: 16 %
Lymphs Abs: 1 K/uL (ref 0.7–4.0)
MCH: 30.4 pg (ref 26.0–34.0)
MCHC: 33.1 g/dL (ref 30.0–36.0)
MCV: 91.8 fL (ref 80.0–100.0)
Monocytes Absolute: 0.6 K/uL (ref 0.1–1.0)
Monocytes Relative: 10 %
Neutro Abs: 4.4 K/uL (ref 1.7–7.7)
Neutrophils Relative %: 69 %
Platelet Count: 194 K/uL (ref 150–400)
RBC: 3.92 MIL/uL — ABNORMAL LOW (ref 4.22–5.81)
RDW: 14.1 % (ref 11.5–15.5)
WBC Count: 6.2 K/uL (ref 4.0–10.5)
nRBC: 0 % (ref 0.0–0.2)

## 2024-08-18 NOTE — Progress Notes (Signed)
 Hematology/Oncology Consult note [facility] [phone] [phone]  Patient Care Team: Sowles, Krichna, MD as PCP - General (Family Medicine) [facility], [facility])   Name of the patient: [name]  [id number]  03-05-1971    Reason for referral-iron  deficiency anemia   Referring physician-Dr. Krichna Sowles  Date of visit: 08/18/24   History of presenting illness-patient is a 53 year old male with a past medical history significant for gastric bypass surgery type 2 diabetes hypertension hyperlipidemia among other medical problems.  He presented To the hospital in August 2025 with symptoms of upper GI bleed and underwent EGD by Dr. Jinny which showed ulcer in the esophagus and anastomosis ulcer which were nonbleeding.  He was discharged on Protonix .  He was also frequently using Goody powder and was asked to stop that.  He received blood transfusion in the hospital and 1 dose of Venofer .  ECOG PS- 1  Pain scale- 0   Review of systems- Review of Systems  Constitutional:  Negative for chills, fever, malaise/fatigue and weight loss.  HENT:  Negative for congestion, ear discharge and nosebleeds.   Eyes:  Negative for blurred vision.  Respiratory:  Negative for cough, hemoptysis, sputum production, shortness of breath and wheezing.   Cardiovascular:  Negative for chest pain, palpitations, orthopnea and claudication.  Gastrointestinal:  Negative for abdominal pain, blood in stool, constipation, diarrhea, heartburn, melena, nausea and vomiting.  Genitourinary:  Negative for dysuria, flank pain, frequency, hematuria and urgency.  Musculoskeletal:  Negative for back pain, joint pain and myalgias.  Skin:  Negative for rash.  Neurological:  Negative for dizziness, tingling, focal weakness, seizures, weakness and headaches.  Endo/Heme/Allergies:  Does not bruise/bleed easily.  Psychiatric/Behavioral:  Negative for depression and  suicidal ideas. The patient does not have insomnia.     Allergies  Allergen Reactions   Morphine Itching    Patient Active Problem List   Diagnosis Date Noted   Ulcer 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 07/27/2024   Symptomatic anemia 07/26/2024   Upper GI bleed 07/26/2024   Obesity (BMI 30-39.9) 07/26/2024   Type 2 diabetes mellitus with complication, without long-term current use of insulin  (HCC) 07/26/2024   History of gout 07/26/2024   Melena 07/26/2024   S/P gastric bypass 12/20/2023   SBO (small bowel obstruction) (HCC) 12/20/2023   History of arthroscopic surgery of shoulder 11/26/2023   Chronic pain 06/17/2023   Polypharmacy 06/17/2023   Anxiety and depression 01/04/2023   Diabetic polyneuropathy (HCC) 01/04/2023   B12 deficiency 06/11/2016   Allergic conjunctivitis 06/11/2016   Muscle cramps 06/11/2016   Dyslipidemia 11/02/2015   Acquired nystagmus 08/01/2015   Chronic bilateral low back pain with bilateral sciatica 06/29/2015   Carpal tunnel syndrome 06/29/2015   Osteoarthritis 06/29/2015   Claustrophobia 06/29/2015   Foot drop, left 06/29/2015   Hiatal hernia 06/29/2015   H/O testicular cancer 06/29/2015   Acquired hypothyroidism 06/29/2015   Allergic rhinitis 06/29/2015   Seborrheic keratoses 06/29/2015   Vitamin D  deficiency 06/29/2015     Past Medical History:  Diagnosis Date   Allergy    Anxiety    Depression    Diabetes (HCC)    Gout    Hiatal hernia    Hyperlipidemia    Hypertension    no meds now   Hypothyroidism    Metabolic syndrome    Obesity    OSA on CPAP    no CPAP   Radiculopathy    Seborrhea    Testicular cancer (HCC)  Testicular hypofunction    Urine ketone    Vitamin D  deficiency      Past Surgical History:  Procedure Laterality Date   BACK SURGERY     X 2   ESOPHAGOGASTRODUODENOSCOPY N/A 07/27/2024   Procedure: EGD (ESOPHAGOGASTRODUODENOSCOPY);  Surgeon: Jinny Carmine, MD;  Location: [facility] ENDOSCOPY;  Service: Endoscopy;   Laterality: N/A;   HERNIA REPAIR     left side and umbilical hernia repair   LAPAROSCOPY ABDOMEN DIAGNOSTIC  12/20/2023   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   SHOULDER ARTHROSCOPY Right 03/09/2024   Procedure: ARTHROSCOPY SHOULDER/DEBRIDEMENT;  Surgeon: Josefina Chew, MD;  Location: [facility];  Service: Orthopedics;  Laterality: Right;   SHOULDER ARTHROSCOPY W/ ROTATOR CUFF REPAIR Left 01/02/2023   SHOULDER ARTHROSCOPY WITH DISTAL CLAVICLE RESECTION Right 03/09/2024   Procedure: SHOULDER ARTHROSCOPY WITH DISTAL CLAVICLE EXCISION;  Surgeon: Josefina Chew, MD;  Location: [facility];  Service: Orthopedics;  Laterality: Right;   SHOULDER ARTHROSCOPY WITH ROTATOR CUFF REPAIR AND SUBACROMIAL DECOMPRESSION Right 03/09/2024   Procedure: SHOULDER ARTHROSCOPY WITH ROTATOR CUFF REPAIR AND SUBACROMIAL DECOMPRESSION;  Surgeon: Josefina Chew, MD;  Location: [facility];  Service: Orthopedics;  Laterality: Right;   URETEROTOMY Left 12/23/1998    Social History   Socioeconomic History   Marital status: Married    Spouse name: Melissa   Number of children: 2   Years of education: Not on file   Highest education level: Some college, no degree  Occupational History   Occupation: grave digger     Comment: [employer]   Tobacco Use   Smoking status: Every Day    Types: Cigars   Smokeless tobacco: Former    Types: Chew    Quit date: 12/24/1991  Vaping Use   Vaping status: Never Used  Substance and Sexual Activity   Alcohol use: No    Alcohol/week: 0.0 standard drinks of alcohol   Drug use: No   Sexual activity: Yes    Partners: Female  Other Topics Concern   Not on file  Social History Narrative   Married, lives with wife, both children and grandchildren   Social Drivers of Health   Financial Resource Strain: Low Risk  (07/29/2024)   Overall Financial Resource Strain (CARDIA)    Difficulty of Paying Living Expenses: Not hard at all   Food Insecurity: No Food Insecurity (08/18/2024)   Hunger Vital Sign    Worried About Running Out of Food in the Last Year: Never true    Ran Out of Food in the Last Year: Never true  Transportation Needs: No Transportation Needs (08/18/2024)   PRAPARE - Administrator, Civil Service (Medical): No    Lack of Transportation (Non-Medical): No  Physical Activity: Insufficiently Active (07/29/2024)   Exercise Vital Sign    Days of Exercise per Week: 2 days    Minutes of Exercise per Session: 30 min  Stress: No Stress Concern Present (07/29/2024)   [employer] of Occupational Health - Occupational Stress Questionnaire    Feeling of Stress: Not at all  Social Connections: Moderately Isolated (07/29/2024)   Social Connection and Isolation Panel    Frequency of Communication with Friends and Family: More than three times a week    Frequency of Social Gatherings with Friends and Family: More than three times a week    Attends Religious Services: Never    Database administrator or Organizations: No    Attends Banker Meetings: Never  Marital Status: Married  Catering manager Violence: Not At Risk (08/18/2024)   Humiliation, Afraid, Rape, and Kick questionnaire    Fear of Current or Ex-Partner: No    Emotionally Abused: No    Physically Abused: No    Sexually Abused: No     Family History  Problem Relation Age of Onset   Coronary artery disease Mother    Cancer Father        Melanoma and Testicular   Diabetes Father      Current Outpatient Medications:    Accu-Chek Softclix Lancets lancets, 1 each 3 (three) times daily., Disp: , Rfl:    acetaminophen  (TYLENOL ) 325 MG tablet, Take 2 tablets (650 mg total) by mouth every 6 (six) hours as needed for mild pain (pain score 1-3) or fever (or Fever >/= 101)., Disp: , Rfl:    albuterol  (VENTOLIN  HFA) 108 (90 Base) MCG/ACT inhaler, Inhale 2 puffs into the lungs every 6 (six) hours as needed for wheezing or shortness of  breath., Disp: 8 g, Rfl: 2   allopurinol  (ZYLOPRIM ) 100 MG tablet, Take 1 tablet (100 mg total) by mouth 2 (two) times daily., Disp: 180 tablet, Rfl: 1   Blood Glucose Monitoring Suppl (ACCU-CHEK GUIDE ME) w/Device KIT, as directed., Disp: , Rfl:    busPIRone  (BUSPAR ) 5 MG tablet, TAKE 1 TO 2 TABLETS BY MOUTH THREE TIMES DAILY, Disp: 180 tablet, Rfl: 0   cyclobenzaprine  (FLEXERIL ) 10 MG tablet, Take 1 tablet (10 mg total) by mouth 3 (three) times daily as needed for muscle spasms. TAKE 1 TABLET BY MOUTH AT BEDTIME, Disp: 90 tablet, Rfl: 1   DULoxetine  (CYMBALTA ) 60 MG capsule, TAKE 1 CAPSULE(60 MG) BY MOUTH DAILY, Disp: 30 capsule, Rfl: 0   fluticasone  (FLONASE ) 50 MCG/ACT nasal spray, Place 2 sprays into both nostrils daily., Disp: 48 g, Rfl: 1   glucose blood (ACCU-CHEK GUIDE TEST) test strip, USE ONE STRIP DAILY, Disp: 100 strip, Rfl: 1   levothyroxine  (SYNTHROID ) 88 MCG tablet, TAKE 1 TABLET(88 MCG) BY MOUTH DAILY BEFORE BREAKFAST, Disp: 30 tablet, Rfl: 0   MOUNJARO  12.5 MG/0.5ML Pen, ADMINISTER 12.5 MG UNDER THE SKIN 1 TIME A WEEK, Disp: 6 mL, Rfl: 0   pantoprazole  (PROTONIX ) 40 MG tablet, Take 1 tablet (40 mg total) by mouth daily., Disp: 30 tablet, Rfl: 0   pregabalin  (LYRICA ) 225 MG capsule, TAKE 1 CAPSULE(225 MG) BY MOUTH TWICE DAILY, Disp: 60 capsule, Rfl: 0   QUEtiapine  (SEROQUEL ) 25 MG tablet, Take 1 tablet (25 mg total) by mouth at bedtime., Disp: 90 tablet, Rfl: 1   rosuvastatin  (CRESTOR ) 5 MG tablet, TAKE 1 TABLET(5 MG) BY MOUTH DAILY, Disp: 30 tablet, Rfl: 0   traZODone  (DESYREL ) 100 MG tablet, TAKE 2 TABLETS BY MOUTH AT BEDTIME, Disp: 60 tablet, Rfl: 0   metFORMIN  (GLUCOPHAGE -XR) 750 MG 24 hr tablet, Take 750 mg by mouth daily. (Patient not taking: Reported on 08/18/2024), Disp: , Rfl:    Physical exam:  Vitals:   08/18/24 1130  BP: (!) 135/100  Pulse: 82  Resp: 18  Temp: (!) 96.6 F (35.9 C)  TempSrc: Tympanic  SpO2: 100%  Weight: 254 lb 12.8 oz (115.6 kg)  Height: 5' 9  (1.753 m)   Physical Exam Cardiovascular:     Rate and Rhythm: Normal rate and regular rhythm.     Heart sounds: Normal heart sounds.  Pulmonary:     Effort: Pulmonary effort is normal.     Breath sounds: Normal breath sounds.  Abdominal:  General: Bowel sounds are normal.     Palpations: Abdomen is soft.  Skin:    General: Skin is warm and dry.  Neurological:     Mental Status: He is alert and oriented to person, place, and time.           Latest Ref Rng & Units 07/27/2024    3:34 AM  CMP  Glucose 70 - 99 mg/dL 898   BUN 6 - 20 mg/dL 26   Creatinine 9.38 - 1.24 mg/dL 8.81   Sodium 864 - 854 mmol/L 145   Potassium 3.5 - 5.1 mmol/L 4.4   Chloride 98 - 111 mmol/L 112   CO2 22 - 32 mmol/L 27   Calcium  8.9 - 10.3 mg/dL 8.4       Latest Ref Rng & Units 08/18/2024   12:29 PM  CBC  WBC 4.0 - 10.5 K/uL 6.2   Hemoglobin 13.0 - 17.0 g/dL 88.0   Hematocrit 60.9 - 52.0 % 36.0   Platelets 150 - 400 K/uL 194     No images are attached to the encounter.  DG Chest 2 View Result Date: 07/26/2024 CLINICAL DATA:  Shortness of breath EXAM: CHEST - 2 VIEW COMPARISON:  Chest radiograph dated 09/22/2023 FINDINGS: Normal lung volumes. No focal consolidations. No pleural effusion or pneumothorax. Enlarged cardiomediastinal silhouette. No acute osseous abnormality. IMPRESSION: 1. No acute cardiopulmonary process. 2. Enlarged cardiomediastinal silhouette. Electronically Signed   By: Limin  Xu M.D.   On: 07/26/2024 15:44    Assessment and plan- Patient is a 53 y.o. male referred for iron  deficiency anemia  Iron  deficiency anemia secondary to upper GI bleed in the setting of NSAID induced ulcer from excessive use of Goody powder.  This did not require any endoscopy intervention and patient was discharged on PPI.  I am checking his CBC today.  He received PRBC transfusion and 1 dose of Venofer  in the hospital.  I will plan to give him 2 more doses of Feraheme at this time.  Discussed risks and  benefits of Feraheme including all but not limited to possible risk of infusion anaphylactic reaction.  Patient understands and agrees to proceed as planned.  CBC ferritin and iron  studies in 6 and 12 weeks and I will see him back in 12 weeks   Thank you for this kind referral and the opportunity to participate in the care of this  Patient   Visit Diagnosis 1. Symptomatic anemia     Dr. Annah Skene, MD, MPH [facility] [phone] 08/18/2024

## 2024-08-20 ENCOUNTER — Inpatient Hospital Stay

## 2024-08-20 VITALS — BP 140/99 | HR 62 | Temp 97.0°F | Resp 16

## 2024-08-20 DIAGNOSIS — D509 Iron deficiency anemia, unspecified: Secondary | ICD-10-CM | POA: Diagnosis not present

## 2024-08-20 DIAGNOSIS — D508 Other iron deficiency anemias: Secondary | ICD-10-CM

## 2024-08-20 MED ORDER — SODIUM CHLORIDE 0.9 % IV SOLN
510.0000 mg | INTRAVENOUS | Status: DC
  Administered 2024-08-20: 510 mg via INTRAVENOUS
  Filled 2024-08-20: qty 510

## 2024-08-20 MED ORDER — SODIUM CHLORIDE 0.9 % IV SOLN
INTRAVENOUS | Status: DC
  Filled 2024-08-20: qty 250

## 2024-08-20 NOTE — Patient Instructions (Signed)

## 2024-08-25 ENCOUNTER — Other Ambulatory Visit: Payer: Self-pay | Admitting: Family Medicine

## 2024-08-25 DIAGNOSIS — E039 Hypothyroidism, unspecified: Secondary | ICD-10-CM

## 2024-08-25 DIAGNOSIS — M109 Gout, unspecified: Secondary | ICD-10-CM

## 2024-08-25 DIAGNOSIS — F339 Major depressive disorder, recurrent, unspecified: Secondary | ICD-10-CM

## 2024-08-25 DIAGNOSIS — G4709 Other insomnia: Secondary | ICD-10-CM

## 2024-08-25 DIAGNOSIS — I700 Atherosclerosis of aorta: Secondary | ICD-10-CM

## 2024-08-26 ENCOUNTER — Inpatient Hospital Stay: Attending: Oncology

## 2024-08-26 VITALS — BP 136/92 | HR 70 | Temp 97.6°F | Resp 16

## 2024-08-26 DIAGNOSIS — D508 Other iron deficiency anemias: Secondary | ICD-10-CM

## 2024-08-26 DIAGNOSIS — D509 Iron deficiency anemia, unspecified: Secondary | ICD-10-CM | POA: Insufficient documentation

## 2024-08-26 MED ORDER — SODIUM CHLORIDE 0.9 % IV SOLN
510.0000 mg | INTRAVENOUS | Status: DC
  Administered 2024-08-26: 510 mg via INTRAVENOUS
  Filled 2024-08-26: qty 510

## 2024-08-26 MED ORDER — SODIUM CHLORIDE 0.9 % IV SOLN
INTRAVENOUS | Status: DC
Start: 2024-08-26 — End: 2024-08-26
  Filled 2024-08-26: qty 250

## 2024-08-26 NOTE — Patient Instructions (Signed)

## 2024-08-30 ENCOUNTER — Ambulatory Visit

## 2024-08-30 ENCOUNTER — Encounter: Payer: Self-pay | Admitting: Family Medicine

## 2024-08-30 ENCOUNTER — Ambulatory Visit (INDEPENDENT_AMBULATORY_CARE_PROVIDER_SITE_OTHER): Admitting: Family Medicine

## 2024-08-30 VITALS — BP 120/74 | HR 65 | Resp 16 | Ht 69.0 in | Wt 256.4 lb

## 2024-08-30 DIAGNOSIS — M5442 Lumbago with sciatica, left side: Secondary | ICD-10-CM

## 2024-08-30 DIAGNOSIS — I700 Atherosclerosis of aorta: Secondary | ICD-10-CM | POA: Diagnosis not present

## 2024-08-30 DIAGNOSIS — G8929 Other chronic pain: Secondary | ICD-10-CM

## 2024-08-30 DIAGNOSIS — G4709 Other insomnia: Secondary | ICD-10-CM

## 2024-08-30 DIAGNOSIS — M5441 Lumbago with sciatica, right side: Secondary | ICD-10-CM

## 2024-08-30 DIAGNOSIS — E1169 Type 2 diabetes mellitus with other specified complication: Secondary | ICD-10-CM | POA: Diagnosis not present

## 2024-08-30 DIAGNOSIS — F339 Major depressive disorder, recurrent, unspecified: Secondary | ICD-10-CM

## 2024-08-30 DIAGNOSIS — Z23 Encounter for immunization: Secondary | ICD-10-CM | POA: Diagnosis not present

## 2024-08-30 DIAGNOSIS — K221 Ulcer 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: Secondary | ICD-10-CM

## 2024-08-30 DIAGNOSIS — Z8719 Personal history of other diseases of the digestive system: Secondary | ICD-10-CM

## 2024-08-30 DIAGNOSIS — K289 Gastrojejunal ulcer, unspecified as acute or chronic, without hemorrhage or perforation: Secondary | ICD-10-CM

## 2024-08-30 DIAGNOSIS — Z1159 Encounter for screening for other viral diseases: Secondary | ICD-10-CM

## 2024-08-30 DIAGNOSIS — M109 Gout, unspecified: Secondary | ICD-10-CM

## 2024-08-30 DIAGNOSIS — E039 Hypothyroidism, unspecified: Secondary | ICD-10-CM

## 2024-08-30 LAB — POCT GLYCOSYLATED HEMOGLOBIN (HGB A1C): Hemoglobin A1C: 4.8 % (ref 4.0–5.6)

## 2024-08-30 MED ORDER — PREGABALIN 225 MG PO CAPS
225.0000 mg | ORAL_CAPSULE | Freq: Two times a day (BID) | ORAL | 1 refills | Status: AC
Start: 2024-08-30 — End: ?

## 2024-08-30 MED ORDER — ALLOPURINOL 100 MG PO TABS: 100.0000 mg | ORAL_TABLET | Freq: Every day | ORAL | 1 refills | Status: AC

## 2024-08-30 MED ORDER — LEVOTHYROXINE SODIUM 88 MCG PO TABS: 88.0000 ug | ORAL_TABLET | Freq: Every day | ORAL | 1 refills | Status: AC

## 2024-08-30 MED ORDER — PREGABALIN 225 MG PO CAPS: 225.0000 mg | ORAL_CAPSULE | Freq: Two times a day (BID) | ORAL | 1 refills | Status: DC

## 2024-08-30 MED ORDER — CYCLOBENZAPRINE HCL 10 MG PO TABS: 10.0000 mg | ORAL_TABLET | Freq: Every day | ORAL | 1 refills | Status: AC

## 2024-08-30 MED ORDER — MOUNJARO 12.5 MG/0.5ML ~~LOC~~ SOAJ: 12.5000 mg | SUBCUTANEOUS | 0 refills | Status: DC

## 2024-08-30 MED ORDER — ROSUVASTATIN CALCIUM 5 MG PO TABS: 5.0000 mg | ORAL_TABLET | Freq: Every day | ORAL | 1 refills | Status: AC

## 2024-08-30 MED ORDER — DULOXETINE HCL 60 MG PO CPEP: 60.0000 mg | ORAL_CAPSULE | Freq: Every day | ORAL | 1 refills | Status: AC

## 2024-08-30 MED ORDER — TRAZODONE HCL 100 MG PO TABS: 200.0000 mg | ORAL_TABLET | Freq: Every day | ORAL | 1 refills | Status: AC

## 2024-08-30 MED ORDER — PREDNISONE 10 MG PO TABS: 10.0000 mg | ORAL_TABLET | Freq: Two times a day (BID) | ORAL | 0 refills | Status: AC

## 2024-08-30 MED ORDER — BUSPIRONE HCL 5 MG PO TABS: 5.0000 mg | ORAL_TABLET | Freq: Two times a day (BID) | ORAL | 1 refills | Status: AC

## 2024-08-30 NOTE — Progress Notes (Signed)
 Name: [name]   MRN: [id number]    DOB: June 03, 1971   Date:08/30/2024       Progress Note  Subjective  Chief Complaint  Chief Complaint  Patient presents with   Medical Management of Chronic Issues   Discussed the use of AI scribe software for clinical note transcription with the patient, who gave verbal consent to proceed.  History of Present Illness [name] is a 53 year old male with a history of gastrointestinal bleeding and esophageal ulcer who presents for a regular follow-up.  He experienced severe symptomatic anemia due to a gastrointestinal bleed, leading to hospitalization a month ago. The bleeding presented as melena, persisting for about a week before he sought medical attention due to increasing weakness. During his hospital stay, an esophageal ulcer and an ulcer at the anastomosis site from a prior gastric bypass were identified. He received a blood transfusion, which raised his hemoglobin from 6.7 to 8.1, and was also given IV iron  due to low ferritin levels.  He is currently taking pantoprazole  daily and was instructed to take over-the-counter omeprazole and Nexium for a month. He avoids NSAIDs and uses Tylenol  for pain management, although it is not effective for him.  He has a history of type 2 diabetes and was temporarily taken off Mounjaro  and metformin  during his hospital stay, but has since resumed his diabetes medications. His A1c was previously 5.7 in April, and he has not resumed metformin  since his hospitalization.  He has a history of gout, for which he takes allopurinol . He reports a sensation in his toe that suggests a potential flare-up, although he has not had a recent episode.  He is on several medications for various conditions, including atorvastatin  for dyslipidemia, pregabalin  for diabetic neuropathy and back pain, trazodone  for sleep, duloxetine  for depression and pain, and buspirone  for anxiety.  He mentions a history of shoulder  surgeries and a past infection in his finger, which diverted attention from his shoulder issues. He has not seen an orthopedic specialist recently for his shoulder pain.  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  No stomach pain throughout his recent medical issues, reporting only weakness. No symptoms of hypoglycemia, such as excessive hunger, thirst, or frequent urination. His back pain is rated as 5 out of 10/stable     Patient Active Problem List   Diagnosis Date Noted   Iron  deficiency anemia 08/18/2024   Ulcer 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 07/27/2024   Symptomatic anemia 07/26/2024   Upper GI bleed 07/26/2024   Obesity (BMI 30-39.9) 07/26/2024   Type 2 diabetes mellitus with complication, without long-term current use of insulin  (HCC) 07/26/2024   History of gout 07/26/2024   Melena 07/26/2024   S/P gastric bypass 12/20/2023   SBO (small bowel obstruction) (HCC) 12/20/2023   History of arthroscopic surgery of shoulder 11/26/2023   Chronic pain 06/17/2023   Polypharmacy 06/17/2023   Anxiety and depression 01/04/2023   Diabetic polyneuropathy (HCC) 01/04/2023   B12 deficiency 06/11/2016   Allergic conjunctivitis 06/11/2016   Muscle cramps 06/11/2016   Dyslipidemia 11/02/2015   Acquired nystagmus 08/01/2015   Chronic bilateral low back pain with bilateral sciatica 06/29/2015   Carpal tunnel syndrome 06/29/2015   Osteoarthritis 06/29/2015   Claustrophobia 06/29/2015   Foot drop, left 06/29/2015   Hiatal hernia 06/29/2015  H/O testicular cancer 06/29/2015   Acquired hypothyroidism 06/29/2015   Allergic rhinitis 06/29/2015   Seborrheic keratoses 06/29/2015   Vitamin D  deficiency 06/29/2015    Past Surgical History:  Procedure Laterality Date   BACK SURGERY     X 2   ESOPHAGOGASTRODUODENOSCOPY N/A  07/27/2024   Procedure: EGD (ESOPHAGOGASTRODUODENOSCOPY);  Surgeon: Jinny Carmine, MD;  Location: [facility] ENDOSCOPY;  Service: Endoscopy;  Laterality: N/A;   HERNIA REPAIR     left side and umbilical hernia repair   LAPAROSCOPY ABDOMEN DIAGNOSTIC  12/20/2023   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   SHOULDER ARTHROSCOPY Right 03/09/2024   Procedure: ARTHROSCOPY SHOULDER/DEBRIDEMENT;  Surgeon: Josefina Chew, MD;  Location: [facility];  Service: Orthopedics;  Laterality: Right;   SHOULDER ARTHROSCOPY W/ ROTATOR CUFF REPAIR Left 01/02/2023   SHOULDER ARTHROSCOPY WITH DISTAL CLAVICLE RESECTION Right 03/09/2024   Procedure: SHOULDER ARTHROSCOPY WITH DISTAL CLAVICLE EXCISION;  Surgeon: Josefina Chew, MD;  Location: [facility];  Service: Orthopedics;  Laterality: Right;   SHOULDER ARTHROSCOPY WITH ROTATOR CUFF REPAIR AND SUBACROMIAL DECOMPRESSION Right 03/09/2024   Procedure: SHOULDER ARTHROSCOPY WITH ROTATOR CUFF REPAIR AND SUBACROMIAL DECOMPRESSION;  Surgeon: Josefina Chew, MD;  Location: [facility];  Service: Orthopedics;  Laterality: Right;   SPINE SURGERY  2004   Rods and screws   URETEROTOMY Left 12/23/1998    Family History  Problem Relation Age of Onset   Coronary artery disease Mother    Hypertension Mother    Cancer Father        Melanoma and Testicular   Diabetes Father    Early death Father    Hypertension Father     Social History   Tobacco Use   Smoking status: Every Day    Types: Cigars   Smokeless tobacco: Former    Types: Chew    Quit date: 12/24/1991  Substance Use Topics   Alcohol use: No    Alcohol/week: 0.0 standard drinks of alcohol     Current Outpatient Medications:    Accu-Chek Softclix Lancets lancets, 1 each 3 (three) times daily., Disp: , Rfl:    acetaminophen  (TYLENOL ) 325 MG tablet, Take 2 tablets (650 mg total) by mouth every 6 (six) hours as needed for mild pain (pain score 1-3) or fever (or Fever >/=  101)., Disp: , Rfl:    albuterol  (VENTOLIN  HFA) 108 (90 Base) MCG/ACT inhaler, Inhale 2 puffs into the lungs every 6 (six) hours as needed for wheezing or shortness of breath., Disp: 8 g, Rfl: 2   Blood Glucose Monitoring Suppl (ACCU-CHEK GUIDE ME) w/Device KIT, as directed., Disp: , Rfl:    fluticasone  (FLONASE ) 50 MCG/ACT nasal spray, Place 2 sprays into both nostrils daily., Disp: 48 g, Rfl: 1   glucose blood (ACCU-CHEK GUIDE TEST) test strip, USE ONE STRIP DAILY, Disp: 100 strip, Rfl: 1   pantoprazole  (PROTONIX ) 40 MG tablet, Take 1 tablet (40 mg total) by mouth daily., Disp: 30 tablet, Rfl: 0   predniSONE  (DELTASONE ) 10 MG tablet, Take 1 tablet (10 mg total) by mouth 2 (two) times daily with a meal., Disp: 4 tablet, Rfl: 0   allopurinol  (ZYLOPRIM ) 100 MG tablet, Take 1 tablet (100 mg total) by mouth daily., Disp: 90 tablet, Rfl: 1   busPIRone  (BUSPAR ) 5 MG tablet, Take 1 tablet (5 mg total) by mouth 2 (two) times daily., Disp: 180 tablet, Rfl: 1   cyclobenzaprine  (FLEXERIL ) 10 MG tablet, Take 1 tablet (10 mg total) by mouth at bedtime., Disp: 90  tablet, Rfl: 1   DULoxetine  (CYMBALTA ) 60 MG capsule, Take 1 capsule (60 mg total) by mouth daily., Disp: 90 capsule, Rfl: 1   levothyroxine  (SYNTHROID ) 88 MCG tablet, Take 1 tablet (88 mcg total) by mouth daily before breakfast., Disp: 90 tablet, Rfl: 1   pregabalin  (LYRICA ) 225 MG capsule, Take 1 capsule (225 mg total) by mouth 2 (two) times daily., Disp: 180 capsule, Rfl: 1   rosuvastatin  (CRESTOR ) 5 MG tablet, Take 1 tablet (5 mg total) by mouth daily., Disp: 90 tablet, Rfl: 1   tirzepatide  (MOUNJARO ) 12.5 MG/0.5ML Pen, Inject 12.5 mg into the skin once a week., Disp: 6 mL, Rfl: 0   traZODone  (DESYREL ) 100 MG tablet, Take 2 tablets (200 mg total) by mouth at bedtime., Disp: 180 tablet, Rfl: 1  Allergies  Allergen Reactions   Morphine Itching    I personally reviewed active problem list, medication list, allergies, family history with the  patient/caregiver today.   ROS  Ten systems reviewed and is negative except as mentioned in HPI    Objective Physical Exam CONSTITUTIONAL: Patient appears well-developed and well-nourished. No distress. HEENT: Head atraumatic, normocephalic, neck supple. CARDIOVASCULAR: Normal rate, regular rhythm and normal heart sounds. No murmur heard. No BLE edema. Extremities normal. PULMONARY: Effort normal and breath sounds normal. No respiratory distress. ABDOMINAL: There is no tenderness or distention. MUSCULOSKELETAL: Normal gait. Without gross motor or sensory deficit. PSYCHIATRIC: Patient has a normal mood and affect. Behavior is normal. Judgment and thought content normal. NEUROLOGICAL: Sensation intact.  Vitals:   08/30/24 1314  BP: 120/74  Pulse: 65  Resp: 16  SpO2: 99%  Weight: 256 lb 6.4 oz (116.3 kg)  Height: 5' 9 (1.753 m)    Body mass index is 37.86 kg/m.  Recent Results (from the past 2160 hours)  Basic metabolic panel     Status: Abnormal   Collection Time: 07/26/24  3:04 PM  Result Value Ref Range   Sodium 143 135 - 145 mmol/L   Potassium 4.7 3.5 - 5.1 mmol/L   Chloride 111 98 - 111 mmol/L   CO2 25 22 - 32 mmol/L   Glucose, Bld 122 (H) 70 - 99 mg/dL    Comment: Glucose reference range applies only to samples taken after fasting for at least 8 hours.   BUN 31 (H) 6 - 20 mg/dL   Creatinine, Ser 8.79 0.61 - 1.24 mg/dL   Calcium  8.3 (L) 8.9 - 10.3 mg/dL   GFR, Estimated >39 >39 mL/min    Comment: (NOTE) Calculated using the CKD-EPI Creatinine Equation (2021)    Anion gap 7 5 - 15    Comment: Performed at [facility], [address] [place], [place] [address]  CBC     Status: Abnormal   Collection Time: 07/26/24  3:04 PM  Result Value Ref Range   WBC 7.4 4.0 - 10.5 K/uL   RBC 2.25 (L) 4.22 - 5.81 MIL/uL   Hemoglobin 6.7 (L) 13.0 - 17.0 g/dL   HCT 78.5 (L) 60.9 - 47.9 %   MCV 95.1 80.0 - 100.0 fL   MCH 29.8 26.0 - 34.0 pg   MCHC 31.3 30.0 - 36.0  g/dL   RDW 84.3 (H) 88.4 - 84.4 %   Platelets 193 150 - 400 K/uL   nRBC 0.0 0.0 - 0.2 %    Comment: Performed at [facility], [address], [place], [place] [address]  Hepatic function panel     Status: Abnormal   Collection Time:  07/26/24  3:04 PM  Result Value Ref Range   Total Protein 5.4 (L) 6.5 - 8.1 g/dL   Albumin 3.0 (L) 3.5 - 5.0 g/dL   AST 22 15 - 41 U/L   ALT 17 0 - 44 U/L   Alkaline Phosphatase 74 38 - 126 U/L   Total Bilirubin 0.4 0.0 - 1.2 mg/dL   Bilirubin, Direct <9.8 0.0 - 0.2 mg/dL   Indirect Bilirubin NOT CALCULATED 0.3 - 0.9 mg/dL    Comment: Performed at [facility], [address], [place], [place] [address]  ABO/Rh     Status: None   Collection Time: 07/26/24  3:04 PM  Result Value Ref Range   ABO/RH(D)      O POS Performed at [facility], [address] [place], [place] [address]   Ferritin     Status: None   Collection Time: 07/26/24  3:04 PM  Result Value Ref Range   Ferritin 27 24 - 336 ng/mL    Comment: Performed at [facility], [address] [place], [place] [address]  Iron  and TIBC     Status: Abnormal   Collection Time: 07/26/24  3:04 PM  Result Value Ref Range   Iron  35 (L) 45 - 182 ug/dL   TIBC 746 749 - 549 ug/dL   Saturation Ratios 14 (L) 17.9 - 39.5 %   UIBC 218 ug/dL    Comment: Performed at [facility], [address] [place], [place] [address]  Prepare RBC (crossmatch)     Status: None   Collection Time: 07/26/24  4:45 PM  Result Value Ref Range   Order Confirmation      ORDER PROCESSED BY BLOOD BANK Performed at [facility], [address] [place], [place] [address]   Type and screen [facility]     Status: None   Collection Time: 07/26/24  4:48 PM  Result Value Ref Range   ABO/RH(D) O POS    Antibody Screen NEG    Sample Expiration 07/29/2024,2359    Unit Number [id number]    Blood Component Type RED CELLS,LR    Unit division 00     Status of Unit ISSUED,FINAL    Transfusion Status OK TO TRANSFUSE    Crossmatch Result      Compatible Performed at [facility], [address] [place], [place] [address]    Unit Number [id number]    Blood Component Type RED CELLS,LR    Unit division 00    Status of Unit ISSUED,FINAL    Transfusion Status OK TO TRANSFUSE    Crossmatch Result Compatible   BPAM RBC     Status: None   Collection Time: 07/26/24  4:48 PM  Result Value Ref Range   ISSUE DATE / TIME [id number]    Blood Product Unit Number [id number]    PRODUCT CODE [id number]    Unit Type and Rh 5100    Blood Product Expiration Date 797491927640    ISSUE DATE / TIME 797491957685    Blood Product Unit Number [id number]    PRODUCT CODE [id number]    Unit Type and Rh 5100    Blood Product Expiration Date 797491917640   Protime-INR     Status: None   Collection Time: 07/26/24  4:49 PM  Result Value Ref Range   Prothrombin Time 13.5 11.4 - 15.2 seconds   INR 1.0 0.8 - 1.2    Comment: (NOTE) INR goal varies based on device and disease  states. Performed at [facility], [address] [place], [place] [address]   Reticulocytes     Status: Abnormal   Collection Time: 07/26/24  4:49 PM  Result Value Ref Range   Retic Ct Pct 4.9 (H) 0.4 - 3.1 %   RBC. 1.82 (L) 4.22 - 5.81 MIL/uL   Retic Count, Absolute 88.5 19.0 - 186.0 K/uL   Immature Retic Fract 40.1 (H) 2.3 - 15.9 %    Comment: Performed at [facility], [address] [place], [place] [address]  Glucose, capillary     Status: Abnormal   Collection Time: 07/26/24  7:55 PM  Result Value Ref Range   Glucose-Capillary 222 (H) 70 - 99 mg/dL    Comment: Glucose reference range applies only to samples taken after fasting for at least 8 hours.  Vitamin B12     Status: None   Collection Time: 07/27/24  3:34 AM  Result Value Ref Range   Vitamin B-12 332 180 - 914 pg/mL    Comment: (NOTE) This assay is not validated for testing  neonatal or myeloproliferative syndrome specimens for Vitamin B12 levels. Performed at [facility], [address], [place], [place] [address]   CBC     Status: Abnormal   Collection Time: 07/27/24  3:34 AM  Result Value Ref Range   WBC 6.2 4.0 - 10.5 K/uL   RBC 2.72 (L) 4.22 - 5.81 MIL/uL   Hemoglobin 8.1 (L) 13.0 - 17.0 g/dL   HCT 75.0 (L) 60.9 - 47.9 %   MCV 91.5 80.0 - 100.0 fL   MCH 29.8 26.0 - 34.0 pg   MCHC 32.5 30.0 - 36.0 g/dL   RDW 83.7 (H) 88.4 - 84.4 %   Platelets 192 150 - 400 K/uL   nRBC 0.0 0.0 - 0.2 %    Comment: Performed at [facility], [address], [place], [place] [address]  Basic metabolic panel     Status: Abnormal   Collection Time: 07/27/24  3:34 AM  Result Value Ref Range   Sodium 145 135 - 145 mmol/L   Potassium 4.4 3.5 - 5.1 mmol/L   Chloride 112 (H) 98 - 111 mmol/L   CO2 27 22 - 32 mmol/L   Glucose, Bld 101 (H) 70 - 99 mg/dL    Comment: Glucose reference range applies only to samples taken after fasting for at least 8 hours.   BUN 26 (H) 6 - 20 mg/dL   Creatinine, Ser 8.81 0.61 - 1.24 mg/dL   Calcium  8.4 (L) 8.9 - 10.3 mg/dL   GFR, Estimated >39 >39 mL/min    Comment: (NOTE) Calculated using the CKD-EPI Creatinine Equation (2021)    Anion gap 6 5 - 15    Comment: Performed at [facility], [address] [place], [place] [address]  HIV Antibody (routine testing w rflx)     Status: None   Collection Time: 07/27/24  3:34 AM  Result Value Ref Range   HIV Screen 4th Generation wRfx Non Reactive Non Reactive    Comment: Performed at [facility], [address], [place], [place] [address]  Glucose, capillary     Status: Abnormal   Collection Time: 07/27/24  7:42 AM  Result Value Ref Range   Glucose-Capillary 117 (H) 70 - 99 mg/dL    Comment: Glucose reference range applies only to samples taken after fasting for at least 8 hours.  Glucose, capillary     Status: Abnormal  Collection Time: 07/27/24 11:41 AM   Result Value Ref Range   Glucose-Capillary 100 (H) 70 - 99 mg/dL    Comment: Glucose reference range applies only to samples taken after fasting for at least 8 hours.  CBC with Differential (Cancer Center Only)     Status: Abnormal   Collection Time: 08/18/24 12:29 PM  Result Value Ref Range   WBC Count 6.2 4.0 - 10.5 K/uL   RBC 3.92 (L) 4.22 - 5.81 MIL/uL   Hemoglobin 11.9 (L) 13.0 - 17.0 g/dL   HCT 63.9 (L) 60.9 - 47.9 %   MCV 91.8 80.0 - 100.0 fL   MCH 30.4 26.0 - 34.0 pg   MCHC 33.1 30.0 - 36.0 g/dL   RDW 85.8 88.4 - 84.4 %   Platelet Count 194 150 - 400 K/uL   nRBC 0.0 0.0 - 0.2 %   Neutrophils Relative % 69 %   Neutro Abs 4.4 1.7 - 7.7 K/uL   Lymphocytes Relative 16 %   Lymphs Abs 1.0 0.7 - 4.0 K/uL   Monocytes Relative 10 %   Monocytes Absolute 0.6 0.1 - 1.0 K/uL   Eosinophils Relative 3 %   Eosinophils Absolute 0.2 0.0 - 0.5 K/uL   Basophils Relative 1 %   Basophils Absolute 0.0 0.0 - 0.1 K/uL   Immature Granulocytes 1 %   Abs Immature Granulocytes 0.03 0.00 - 0.07 K/uL    Comment: Performed at [facility], [address] [place], [place] [address]  POCT glycosylated hemoglobin (Hb A1C)     Status: None   Collection Time: 08/30/24  1:17 PM  Result Value Ref Range   Hemoglobin A1C 4.8 4.0 - 5.6 %   HbA1c POC (<> result, manual entry)     HbA1c, POC (prediabetic range)     HbA1c, POC (controlled diabetic range)      Diabetic Foot Exam:  Diabetic foot exam was performed with the following findings:   Normal sensation of 10g monofilament Intact posterior tibialis and dorsalis pedis pulses Deformity of first toe nail right foot      PHQ2/9:    08/30/2024    1:00 PM 08/18/2024   11:34 AM 08/18/2024   11:27 AM 07/29/2024    1:20 PM 03/26/2024    9:03 AM  Depression screen PHQ 2/9  Decreased Interest 0 0 0 0 0  Down, Depressed, Hopeless 0 0 0 0 0  PHQ - 2 Score 0 0 0 0 0  Altered sleeping 0 0 0 0 0  Tired, decreased energy 0 0 0 0 0  Change in appetite  0 0 0 0 0  Feeling bad or failure about yourself  0 0 0 0 0  Trouble concentrating 0 0 0 0 0  Moving slowly or fidgety/restless 0 0 0 0 0  Suicidal thoughts 0 0 0 0 0  PHQ-9 Score 0 0 0 0 0  Difficult doing work/chores Not difficult at all   Not difficult at all Not difficult at all    phq 9 is negative  Fall Risk:    08/30/2024   12:59 PM 07/29/2024    1:23 PM 02/06/2024    7:52 AM 11/26/2023    9:06 AM 09/05/2023    8:20 AM  Fall Risk   Falls in the past year? 1 1 0 0 1  Number falls in past yr: 1 0 0 0 1  Injury with Fall? 0 0 0 0 1  Risk for fall due to :  Impaired balance/gait History of fall(s) No Fall Risks No Fall Risks History of fall(s)  Follow up Falls evaluation completed Falls evaluation completed;Falls prevention discussed Falls prevention discussed;Education provided;Falls evaluation completed Falls prevention discussed;Education provided;Falls evaluation completed Falls prevention discussed     Assessment & Plan Recent upper gastrointestinal bleeding due to esophageal and anastomotic ulcers with iron  deficiency anemia Recent upper GI bleeding secondary to esophageal and anastomotic ulcers. Hemoglobin improved from 6.7 to 11.9 post-transfusion and IV iron . No current bleeding or pain. Advised to avoid NSAIDs. - Continue pantoprazole  and over-the-counter PPIs as prescribed. - Avoid NSAIDs such as Aleve and Motrin. - Follow up with gastroenterologist as planned.  Type 2 diabetes mellitus with diabetic neuropathy/dyslipidemia/obesity Type 2 diabetes with diabetic neuropathy. Recent A1c invalid due to transfusion. Previous A1c was 5.7. On Mounjaro , not metformin . No hyperglycemia or hypoglycemia symptoms. - Continue Mounjaro . - Discontinue metformin . - Reassess need for metformin  if blood glucose levels increase.  Gout, currently flaring Current gout flare in toe. Prednisone  considered safe despite recent GI bleed. - Prescribe prednisone  for a few days to manage gout  flare. - Take prednisone  with food.  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 Pain level 5/10. - Continue pregabalin . - Prescribe cyclobenzaprine  for muscle spasms.  Morbid obesity Morbid obesity noted.  Hypothyroidism Hypothyroidism controlled as of April. On levothyroxine  88 mcg. No symptoms reported. - Continue levothyroxine  88 mcg. - Reassess thyroid  function at next visit.  Dyslipidemia Dyslipidemia with good cholesterol control as of April. On atorvastatin . - Continue atorvastatin .  Major Depression Recurrent Chronic  and generalized anxiety disorder Depression and anxiety managed with duloxetine  and buspirone . Trazodone  for sleep. Emotional state stable. - Continue duloxetine . - Continue buspirone  BID. - Continue trazodone  for sleep.  Insomnia Insomnia managed with trazodone . No side effects reported. - Continue trazodone  for sleep.  General Health Maintenance Pneumonia vaccination due. - Administer pneumonia vaccination.  Follow-Up - Follow up with gastroenterologist as planned. - Reassess thyroid  function at next visit.

## 2024-09-01 ENCOUNTER — Ambulatory Visit

## 2024-09-03 ENCOUNTER — Ambulatory Visit

## 2024-09-29 ENCOUNTER — Inpatient Hospital Stay: Attending: Oncology

## 2024-10-11 ENCOUNTER — Other Ambulatory Visit: Payer: Self-pay | Admitting: Emergency Medicine

## 2024-10-11 DIAGNOSIS — E1169 Type 2 diabetes mellitus with other specified complication: Secondary | ICD-10-CM

## 2024-10-11 DIAGNOSIS — E119 Type 2 diabetes mellitus without complications: Secondary | ICD-10-CM

## 2024-10-20 ENCOUNTER — Other Ambulatory Visit: Payer: Self-pay | Admitting: Family Medicine

## 2024-10-20 DIAGNOSIS — F339 Major depressive disorder, recurrent, unspecified: Secondary | ICD-10-CM

## 2024-10-20 DIAGNOSIS — J302 Other seasonal allergic rhinitis: Secondary | ICD-10-CM

## 2024-11-08 ENCOUNTER — Inpatient Hospital Stay: Attending: Oncology

## 2024-11-08 ENCOUNTER — Inpatient Hospital Stay: Admitting: Oncology

## 2024-11-08 ENCOUNTER — Encounter: Payer: Self-pay | Admitting: Oncology

## 2024-11-12 ENCOUNTER — Other Ambulatory Visit: Payer: Self-pay | Admitting: Family Medicine

## 2024-11-12 DIAGNOSIS — F339 Major depressive disorder, recurrent, unspecified: Secondary | ICD-10-CM

## 2024-11-12 DIAGNOSIS — E1169 Type 2 diabetes mellitus with other specified complication: Secondary | ICD-10-CM

## 2024-11-14 NOTE — Telephone Encounter (Signed)
 Requested medication (s) are due for refill today - yes/no  Requested medication (s) are on the active medication list -yes/no  Future visit scheduled -yes  Last refill: tizepatide - 08/30/24 6ml- off protocol- provider review                   Quetiapine - no longer listed on current medication list- non delegated Rx  Notes to clinic: see above  Requested Prescriptions  Pending Prescriptions Disp Refills   QUEtiapine  (SEROQUEL ) 25 MG tablet [Pharmacy Med Name: QUETIAPINE  25MG  TABLETS] 90 tablet 1    Sig: TAKE 1 TABLET(25 MG) BY MOUTH AT BEDTIME     Not Delegated - Psychiatry:  Antipsychotics - Second Generation (Atypical) - quetiapine  Failed - 11/14/2024 10:41 AM      Failed - This refill cannot be delegated      Failed - Lipid Panel in normal range within the last 12 months    Cholesterol  Date Value Ref Range Status  03/26/2024 104 <200 mg/dL Final  98/79/7983 896 0 - 200 mg/dL Final   Ldl Cholesterol, Calc  Date Value Ref Range Status  01/11/2015 58 0 - 100 mg/dL Final   LDL Cholesterol (Calc)  Date Value Ref Range Status  03/26/2024 48 mg/dL (calc) Final    Comment:    Reference range: <100 . Desirable range <100 mg/dL for primary prevention;   <70 mg/dL for patients with CHD or diabetic patients  with > or = 2 CHD risk factors. SABRA LDL-C is now calculated using the Martin-Hopkins  calculation, which is a validated novel method providing  better accuracy than the Friedewald equation in the  estimation of LDL-C.  [name] et al. SANDREA. 7986;689(80): 2061-2068  (http://education.[facility].com/faq/FAQ164)    HDL Cholesterol  Date Value Ref Range Status  01/11/2015 26 (L) 40 - 60 mg/dL Final   HDL  Date Value Ref Range Status  03/26/2024 42 > OR = 40 mg/dL Final   Triglycerides  Date Value Ref Range Status  03/26/2024 64 <150 mg/dL Final  98/79/7983 97 0 - 200 mg/dL Final         Passed - TSH in normal range and within 360 days    TSH  Date Value  Ref Range Status  03/26/2024 1.88 0.40 - 4.50 mIU/L Final         Passed - Completed PHQ-2 or PHQ-9 in the last 360 days      Passed - Last BP in normal range    BP Readings from Last 1 Encounters:  08/30/24 120/74         Passed - Last Heart Rate in normal range    Pulse Readings from Last 1 Encounters:  08/30/24 65         Passed - Valid encounter within last 6 months    Recent Outpatient Visits           2 months ago Dyslipidemia associated with type 2 diabetes mellitus [facility])   [place] [facility] Glenard Mire, MD   7 months ago Dyslipidemia associated with type 2 diabetes mellitus [facility])   [place] [facility] [place], Mire, MD   8 months ago Dyslipidemia associated with type 2 diabetes mellitus [facility])   [place] [facility] Glenard Mire, MD   9 months ago Dyslipidemia associated with type 2 diabetes mellitus [facility])   [facility] Sowles, Krichna, MD  Passed - CBC within normal limits and completed in the last 12 months    WBC  Date Value Ref Range Status  07/27/2024 6.2 4.0 - 10.5 K/uL Final   WBC Count  Date Value Ref Range Status  08/18/2024 6.2 4.0 - 10.5 K/uL Final   RBC  Date Value Ref Range Status  08/18/2024 3.92 (L) 4.22 - 5.81 MIL/uL Final   Hemoglobin  Date Value Ref Range Status  08/18/2024 11.9 (L) 13.0 - 17.0 g/dL Final  96/80/7975 85.6 13.0 - 17.7 g/dL Final   HCT  Date Value Ref Range Status  08/18/2024 36.0 (L) 39.0 - 52.0 % Final   Hematocrit  Date Value Ref Range Status  03/11/2023 45.9 37.5 - 51.0 % Final   MCHC  Date Value Ref Range Status  08/18/2024 33.1 30.0 - 36.0 g/dL Final   [facility]  Date Value Ref Range Status  08/18/2024 30.4 26.0 - 34.0 pg Final   MCV  Date Value Ref Range Status  08/18/2024 91.8 80.0 - 100.0 fL Final  01/11/2015 88 80 - 100 fL Final   No results found for: PLTCOUNTKUC, LABPLAT, POCPLA RDW   Date Value Ref Range Status  08/18/2024 14.1 11.5 - 15.5 % Final  01/11/2015 13.3 11.5 - 14.5 % Final         Passed - CMP within normal limits and completed in the last 12 months    Albumin  Date Value Ref Range Status  07/26/2024 3.0 (L) 3.5 - 5.0 g/dL Final   Alkaline Phosphatase  Date Value Ref Range Status  07/26/2024 74 38 - 126 U/L Final   Alkaline phosphatase (APISO)  Date Value Ref Range Status  03/26/2024 99 35 - 144 U/L Final   ALT  Date Value Ref Range Status  07/26/2024 17 0 - 44 U/L Final   AST  Date Value Ref Range Status  07/26/2024 22 15 - 41 U/L Final   BUN  Date Value Ref Range Status  07/27/2024 26 (H) 6 - 20 mg/dL Final  98/79/7983 15 7 - 18 mg/dL Final   Calcium   Date Value Ref Range Status  07/27/2024 8.4 (L) 8.9 - 10.3 mg/dL Final   Calcium , Total  Date Value Ref Range Status  01/11/2015 8.2 (L) 8.5 - 10.1 mg/dL Final   Calcium , Ion  Date Value Ref Range Status  06/11/2016 5.0 4.8 - 5.6 mg/dL Final    Comment:    ** Please note change in reference range(s). **   ** Please note change in unit of measure. **    CO2  Date Value Ref Range Status  07/27/2024 27 22 - 32 mmol/L Final   Co2  Date Value Ref Range Status  01/11/2015 33 (H) 21 - 32 mmol/L Final   Creat  Date Value Ref Range Status  03/26/2024 1.43 (H) 0.70 - 1.30 mg/dL Final   Creatinine, Ser  Date Value Ref Range Status  07/27/2024 1.18 0.61 - 1.24 mg/dL Final   Creatinine, Urine  Date Value Ref Range Status  11/26/2023 77 20 - 320 mg/dL Final   Glucose  Date Value Ref Range Status  01/11/2015 93 65 - 99 mg/dL Final   Glucose, Bld  Date Value Ref Range Status  07/27/2024 101 (H) 70 - 99 mg/dL Final    Comment:    Glucose reference range applies only to samples taken after fasting for at least 8 hours.   Glucose-Capillary  Date Value Ref Range Status  07/27/2024 100 (H) 70 -  99 mg/dL Final    Comment:    Glucose reference range applies only to samples  taken after fasting for at least 8 hours.   Potassium  Date Value Ref Range Status  07/27/2024 4.4 3.5 - 5.1 mmol/L Final  01/11/2015 4.2 3.5 - 5.1 mmol/L Final   Sodium  Date Value Ref Range Status  07/27/2024 145 135 - 145 mmol/L Final  01/11/2015 144 136 - 145 mmol/L Final   Total Bilirubin  Date Value Ref Range Status  07/26/2024 0.4 0.0 - 1.2 mg/dL Final   Bilirubin, Direct  Date Value Ref Range Status  07/26/2024 <0.1 0.0 - 0.2 mg/dL Final   Indirect Bilirubin  Date Value Ref Range Status  07/26/2024 NOT CALCULATED 0.3 - 0.9 mg/dL Final    Comment:    Performed at [facility], [address] [place], [place] [address]   Protein, ur  Date Value Ref Range Status  06/17/2023 NEGATIVE NEGATIVE mg/dL Final   Total Protein  Date Value Ref Range Status  07/26/2024 5.4 (L) 6.5 - 8.1 g/dL Final   GFR, Est African American  Date Value Ref Range Status  03/28/2021 75 > OR = 60 mL/min/1.31m2 Final   eGFR  Date Value Ref Range Status  03/26/2024 59 (L) > OR = 60 mL/min/1.58m2 Final   GFR, Est Non African American  Date Value Ref Range Status  03/28/2021 65 > OR = 60 mL/min/1.45m2 Final   GFR, Estimated  Date Value Ref Range Status  07/27/2024 >60 >60 mL/min Final    Comment:    (NOTE) Calculated using the CKD-EPI Creatinine Equation (2021)           MOUNJARO  12.5 MG/0.5ML Pen [Pharmacy Med Name: MOUNJARO  12.5MG /0.5ML INJ (4 PENS)] 6 mL 0    Sig: ADMINISTER 12.5 MG UNDER THE SKIN 1 TIME A WEEK     Off-Protocol Failed - 11/14/2024 10:41 AM      Failed - Medication not assigned to a protocol, review manually.      Passed - Valid encounter within last 12 months    Recent Outpatient Visits           2 months ago Dyslipidemia associated with type 2 diabetes mellitus [facility])   [place] [facility] [place], Dorette, MD   7 months ago Dyslipidemia associated with type 2 diabetes mellitus [facility])   [place] [facility]  [place], Dorette, MD   8 months ago Dyslipidemia associated with type 2 diabetes mellitus [facility])   [facility] Glenard Dorette, MD   9 months ago Dyslipidemia associated with type 2 diabetes mellitus [facility])   [place] [facility] Sowles, Krichna, MD                 Requested Prescriptions  Pending Prescriptions Disp Refills   QUEtiapine  (SEROQUEL ) 25 MG tablet [Pharmacy Med Name: QUETIAPINE  25MG  TABLETS] 90 tablet 1    Sig: TAKE 1 TABLET(25 MG) BY MOUTH AT BEDTIME     Not Delegated - Psychiatry:  Antipsychotics - Second Generation (Atypical) - quetiapine  Failed - 11/14/2024 10:41 AM      Failed - This refill cannot be delegated      Failed - Lipid Panel in normal range within the last 12 months    Cholesterol  Date Value Ref Range Status  03/26/2024 104 <200 mg/dL Final  98/79/7983 896 0 - 200 mg/dL Final   Ldl Cholesterol, Calc  Date Value Ref Range Status  01/11/2015  58 0 - 100 mg/dL Final   LDL Cholesterol (Calc)  Date Value Ref Range Status  03/26/2024 48 mg/dL (calc) Final    Comment:    Reference range: <100 . Desirable range <100 mg/dL for primary prevention;   <70 mg/dL for patients with CHD or diabetic patients  with > or = 2 CHD risk factors. SABRA LDL-C is now calculated using the Martin-Hopkins  calculation, which is a validated novel method providing  better accuracy than the Friedewald equation in the  estimation of LDL-C.  [name] et al. SANDREA. 7986;689(80): 2061-2068  (http://education.[facility].com/faq/FAQ164)    HDL Cholesterol  Date Value Ref Range Status  01/11/2015 26 (L) 40 - 60 mg/dL Final   HDL  Date Value Ref Range Status  03/26/2024 42 > OR = 40 mg/dL Final   Triglycerides  Date Value Ref Range Status  03/26/2024 64 <150 mg/dL Final  98/79/7983 97 0 - 200 mg/dL Final         Passed - TSH in normal range and within 360 days    TSH  Date Value Ref Range Status  03/26/2024 1.88  0.40 - 4.50 mIU/L Final         Passed - Completed PHQ-2 or PHQ-9 in the last 360 days      Passed - Last BP in normal range    BP Readings from Last 1 Encounters:  08/30/24 120/74         Passed - Last Heart Rate in normal range    Pulse Readings from Last 1 Encounters:  08/30/24 65         Passed - Valid encounter within last 6 months    Recent Outpatient Visits           2 months ago Dyslipidemia associated with type 2 diabetes mellitus (HCC)   [place] [facility] Glenard Mire, MD   7 months ago Dyslipidemia associated with type 2 diabetes mellitus [facility])   [place] [facility] Glenard Mire, MD   8 months ago Dyslipidemia associated with type 2 diabetes mellitus [facility])   [place] [facility] Glenard Mire, MD   9 months ago Dyslipidemia associated with type 2 diabetes mellitus [facility])    [facility] [place], Mire, MD              Passed - CBC within normal limits and completed in the last 12 months    WBC  Date Value Ref Range Status  07/27/2024 6.2 4.0 - 10.5 K/uL Final   WBC Count  Date Value Ref Range Status  08/18/2024 6.2 4.0 - 10.5 K/uL Final   RBC  Date Value Ref Range Status  08/18/2024 3.92 (L) 4.22 - 5.81 MIL/uL Final   Hemoglobin  Date Value Ref Range Status  08/18/2024 11.9 (L) 13.0 - 17.0 g/dL Final  96/80/7975 85.6 13.0 - 17.7 g/dL Final   HCT  Date Value Ref Range Status  08/18/2024 36.0 (L) 39.0 - 52.0 % Final   Hematocrit  Date Value Ref Range Status  03/11/2023 45.9 37.5 - 51.0 % Final   MCHC  Date Value Ref Range Status  08/18/2024 33.1 30.0 - 36.0 g/dL Final   [facility]  Date Value Ref Range Status  08/18/2024 30.4 26.0 - 34.0 pg Final   MCV  Date Value Ref Range Status  08/18/2024 91.8 80.0 - 100.0 fL Final  01/11/2015 88 80 - 100 fL Final   No results found for: PLTCOUNTKUC, LABPLAT,  POCPLA RDW  Date Value Ref Range Status   08/18/2024 14.1 11.5 - 15.5 % Final  01/11/2015 13.3 11.5 - 14.5 % Final         Passed - CMP within normal limits and completed in the last 12 months    Albumin  Date Value Ref Range Status  07/26/2024 3.0 (L) 3.5 - 5.0 g/dL Final   Alkaline Phosphatase  Date Value Ref Range Status  07/26/2024 74 38 - 126 U/L Final   Alkaline phosphatase (APISO)  Date Value Ref Range Status  03/26/2024 99 35 - 144 U/L Final   ALT  Date Value Ref Range Status  07/26/2024 17 0 - 44 U/L Final   AST  Date Value Ref Range Status  07/26/2024 22 15 - 41 U/L Final   BUN  Date Value Ref Range Status  07/27/2024 26 (H) 6 - 20 mg/dL Final  98/79/7983 15 7 - 18 mg/dL Final   Calcium   Date Value Ref Range Status  07/27/2024 8.4 (L) 8.9 - 10.3 mg/dL Final   Calcium , Total  Date Value Ref Range Status  01/11/2015 8.2 (L) 8.5 - 10.1 mg/dL Final   Calcium , Ion  Date Value Ref Range Status  06/11/2016 5.0 4.8 - 5.6 mg/dL Final    Comment:    ** Please note change in reference range(s). **   ** Please note change in unit of measure. **    CO2  Date Value Ref Range Status  07/27/2024 27 22 - 32 mmol/L Final   Co2  Date Value Ref Range Status  01/11/2015 33 (H) 21 - 32 mmol/L Final   Creat  Date Value Ref Range Status  03/26/2024 1.43 (H) 0.70 - 1.30 mg/dL Final   Creatinine, Ser  Date Value Ref Range Status  07/27/2024 1.18 0.61 - 1.24 mg/dL Final   Creatinine, Urine  Date Value Ref Range Status  11/26/2023 77 20 - 320 mg/dL Final   Glucose  Date Value Ref Range Status  01/11/2015 93 65 - 99 mg/dL Final   Glucose, Bld  Date Value Ref Range Status  07/27/2024 101 (H) 70 - 99 mg/dL Final    Comment:    Glucose reference range applies only to samples taken after fasting for at least 8 hours.   Glucose-Capillary  Date Value Ref Range Status  07/27/2024 100 (H) 70 - 99 mg/dL Final    Comment:    Glucose reference range applies only to samples taken after fasting for at  least 8 hours.   Potassium  Date Value Ref Range Status  07/27/2024 4.4 3.5 - 5.1 mmol/L Final  01/11/2015 4.2 3.5 - 5.1 mmol/L Final   Sodium  Date Value Ref Range Status  07/27/2024 145 135 - 145 mmol/L Final  01/11/2015 144 136 - 145 mmol/L Final   Total Bilirubin  Date Value Ref Range Status  07/26/2024 0.4 0.0 - 1.2 mg/dL Final   Bilirubin, Direct  Date Value Ref Range Status  07/26/2024 <0.1 0.0 - 0.2 mg/dL Final   Indirect Bilirubin  Date Value Ref Range Status  07/26/2024 NOT CALCULATED 0.3 - 0.9 mg/dL Final    Comment:    Performed at [facility], [address] [place], [place] [address]   Protein, ur  Date Value Ref Range Status  06/17/2023 NEGATIVE NEGATIVE mg/dL Final   Total Protein  Date Value Ref Range Status  07/26/2024 5.4 (L) 6.5 - 8.1 g/dL Final   GFR, Est African American  Date Value  Ref Range Status  03/28/2021 75 > OR = 60 mL/min/1.47m2 Final   eGFR  Date Value Ref Range Status  03/26/2024 59 (L) > OR = 60 mL/min/1.65m2 Final   GFR, Est Non African American  Date Value Ref Range Status  03/28/2021 65 > OR = 60 mL/min/1.65m2 Final   GFR, Estimated  Date Value Ref Range Status  07/27/2024 >60 >60 mL/min Final    Comment:    (NOTE) Calculated using the CKD-EPI Creatinine Equation (2021)           MOUNJARO  12.5 MG/0.5ML Pen [Pharmacy Med Name: MOUNJARO  12.5MG /0.5ML INJ (4 PENS)] 6 mL 0    Sig: ADMINISTER 12.5 MG UNDER THE SKIN 1 TIME A WEEK     Off-Protocol Failed - 11/14/2024 10:41 AM      Failed - Medication not assigned to a protocol, review manually.      Passed - Valid encounter within last 12 months    Recent Outpatient Visits           2 months ago Dyslipidemia associated with type 2 diabetes mellitus [facility])   [place] [facility] [place], Dorette, MD   7 months ago Dyslipidemia associated with type 2 diabetes mellitus [facility])   [place] [facility] Glenard Dorette, MD   8  months ago Dyslipidemia associated with type 2 diabetes mellitus [facility])   [place] [facility] Sowles, Krichna, MD   9 months ago Dyslipidemia associated with type 2 diabetes mellitus [facility])   [facility] Sowles, Krichna, MD

## 2024-11-15 ENCOUNTER — Other Ambulatory Visit: Payer: Self-pay | Admitting: Family Medicine

## 2024-11-15 DIAGNOSIS — F339 Major depressive disorder, recurrent, unspecified: Secondary | ICD-10-CM

## 2024-11-16 NOTE — Telephone Encounter (Signed)
 Too soon for refill, LRF 09/03/24 for 90;1.  Requested Prescriptions  Pending Prescriptions Disp Refills   busPIRone  (BUSPAR ) 5 MG tablet [Pharmacy Med Name: BUSPIRONE  5MG  TABLETS] 180 tablet 1    Sig: TAKE 1 TABLET(5 MG) BY MOUTH TWICE DAILY     Psychiatry: Anxiolytics/Hypnotics - Non-controlled Passed - 11/16/2024 11:18 AM      Passed - Valid encounter within last 12 months    Recent Outpatient Visits           2 months ago Dyslipidemia associated with type 2 diabetes mellitus [facility])   [place] [facility] [place], Dorette, MD   7 months ago Dyslipidemia associated with type 2 diabetes mellitus [facility])   [facility] [place], Dorette, MD   8 months ago Dyslipidemia associated with type 2 diabetes mellitus [facility])   [place] [facility] Glenard Dorette, MD   9 months ago Dyslipidemia associated with type 2 diabetes mellitus [facility])   [facility] Sowles, Krichna, MD

## 2024-11-20 ENCOUNTER — Other Ambulatory Visit: Payer: Self-pay | Admitting: Family Medicine

## 2024-11-20 DIAGNOSIS — F339 Major depressive disorder, recurrent, unspecified: Secondary | ICD-10-CM

## 2024-11-24 NOTE — Telephone Encounter (Signed)
 Too soon for refill.  Requested Prescriptions  Pending Prescriptions Disp Refills   DULoxetine  (CYMBALTA ) 60 MG capsule [Pharmacy Med Name: DULOXETINE  DR 60MG  CAPSULES] 90 capsule 1    Sig: TAKE 1 CAPSULE(60 MG) BY MOUTH DAILY     Psychiatry: Antidepressants - SNRI - duloxetine  Passed - 11/24/2024  9:55 AM      Passed - Cr in normal range and within 360 days    Creat  Date Value Ref Range Status  03/26/2024 1.43 (H) 0.70 - 1.30 mg/dL Final   Creatinine, Ser  Date Value Ref Range Status  07/27/2024 1.18 0.61 - 1.24 mg/dL Final   Creatinine, Urine  Date Value Ref Range Status  11/26/2023 77 20 - 320 mg/dL Final         Passed - eGFR is 30 or above and within 360 days    GFR, Est African American  Date Value Ref Range Status  03/28/2021 75 > OR = 60 mL/min/1.79m2 Final   GFR, Est Non African American  Date Value Ref Range Status  03/28/2021 65 > OR = 60 mL/min/1.82m2 Final   GFR, Estimated  Date Value Ref Range Status  07/27/2024 >60 >60 mL/min Final    Comment:    (NOTE) Calculated using the CKD-EPI Creatinine Equation (2021)    eGFR  Date Value Ref Range Status  03/26/2024 59 (L) > OR = 60 mL/min/1.79m2 Final         Passed - Completed PHQ-2 or PHQ-9 in the last 360 days      Passed - Last BP in normal range    BP Readings from Last 1 Encounters:  08/30/24 120/74         Passed - Valid encounter within last 6 months    Recent Outpatient Visits           2 months ago Dyslipidemia associated with type 2 diabetes mellitus [facility])   [place] [facility] Glenard Mire, MD   8 months ago Dyslipidemia associated with type 2 diabetes mellitus [facility])   [place] [facility] [place], Krichna, MD   8 months ago Dyslipidemia associated with type 2 diabetes mellitus [facility])   [place] [facility] Sowles, Krichna, MD   9 months ago Dyslipidemia associated with type 2 diabetes mellitus [facility])   [facility] Sowles, Krichna, MD

## 2024-12-23 ENCOUNTER — Encounter: Payer: Self-pay | Admitting: Oncology

## 2024-12-30 ENCOUNTER — Ambulatory Visit: Admitting: Family Medicine

## 2025-01-11 ENCOUNTER — Emergency Department

## 2025-01-11 ENCOUNTER — Emergency Department
Admission: EM | Admit: 2025-01-11 | Discharge: 2025-01-11 | Disposition: A | Discharge status: Home / Self Care | Attending: Emergency Medicine | Admitting: Emergency Medicine

## 2025-01-11 ENCOUNTER — Other Ambulatory Visit: Payer: Self-pay

## 2025-01-11 DIAGNOSIS — W293XXA Contact with powered garden and outdoor hand tools and machinery, initial encounter: Secondary | ICD-10-CM | POA: Diagnosis not present

## 2025-01-11 DIAGNOSIS — Z8547 Personal history of malignant neoplasm of testis: Secondary | ICD-10-CM | POA: Diagnosis not present

## 2025-01-11 DIAGNOSIS — S61022A Laceration with foreign body of left thumb without damage to nail, initial encounter: Secondary | ICD-10-CM | POA: Diagnosis not present

## 2025-01-11 DIAGNOSIS — E119 Type 2 diabetes mellitus without complications: Secondary | ICD-10-CM | POA: Diagnosis not present

## 2025-01-11 DIAGNOSIS — I10 Essential (primary) hypertension: Secondary | ICD-10-CM | POA: Insufficient documentation

## 2025-01-11 DIAGNOSIS — S6992XA Unspecified injury of left wrist, hand and finger(s), initial encounter: Secondary | ICD-10-CM | POA: Diagnosis present

## 2025-01-11 MED ORDER — CEPHALEXIN 500 MG PO CAPS: 500.0000 mg | ORAL_CAPSULE | Freq: Four times a day (QID) | ORAL | 0 refills | Status: AC

## 2025-01-11 MED ORDER — BACITRACIN 500 UNIT/GM EX OINT: 1.0000 | TOPICAL_OINTMENT | Freq: Two times a day (BID) | CUTANEOUS | 0 refills | Status: AC

## 2025-01-11 NOTE — ED Provider Notes (Signed)
 "  [facility] Provider Note    Event Date/Time   First MD Initiated Contact with Patient 01/11/25 [phone]     (approximate)   History   Laceration   HPI  [name] is a 54 y.o. male with PMH of diabetes, testicular cancer and hypertension who presents for evaluation of a laceration to the left thumb.  Patient was using a table saw while woodworking yesterday when he caught the tip of his thumb in the saw.  He rinsed out with water at home and covered it up with a bandage.  Patient did not have a way to get to the hospital yesterday which is why he presents this morning.  He is up-to-date on his tetanus shot.      Physical Exam   Triage Vital Signs: ED Triage Vitals  Encounter Vitals Group     BP 01/11/25 0545 (!) 157/99     Girls Systolic BP Percentile --      Girls Diastolic BP Percentile --      Boys Systolic BP Percentile --      Boys Diastolic BP Percentile --      Pulse Rate 01/11/25 0545 66     Resp 01/11/25 0543 18     Temp 01/11/25 0543 98 F (36.7 C)     Temp src --      SpO2 01/11/25 0545 99 %     Weight 01/11/25 0543 250 lb (113.4 kg)     Height 01/11/25 0543 5' 9 (1.753 m)     Head Circumference --      Peak Flow --      Pain Score 01/11/25 0543 6     Pain Loc --      Pain Education --      Exclude from Growth Chart --     Most recent vital signs: Vitals:   01/11/25 0543 01/11/25 0545  BP:  (!) 157/99  Pulse:  66  Resp: 18   Temp: 98 F (36.7 C)   SpO2:  99%   General: Awake, no distress.  CV:  Good peripheral perfusion. Resp:  Normal effort.  Abd:  No distention.  Other:  Approximately 3 cm laceration across the tip of the left thumb, no active bleeding, decreased sensation distal to the wound, capillary refill intact, patient able to fully flex and extend the thumb   ED Results / Procedures / Treatments   Labs (all labs ordered are listed, but only abnormal results are displayed) Labs Reviewed - No data to  display   RADIOLOGY  Left thumb x-ray obtained, I interpreted the images as well as reviewed the radiologist report.  X-ray was negative for fractures.  Did show very small, 1 to 2 mm radiopaque foreign body at the tip of the thumb.  PROCEDURES:  Critical Care performed: No  Procedures   MEDICATIONS ORDERED IN ED: Medications - No data to display   IMPRESSION / MDM / ASSESSMENT AND PLAN / ED COURSE  I reviewed the triage vital signs and the nursing notes.                             54 year old male presents for laceration to the left thumb.  Blood pressure was elevated but vital signs stable otherwise.  Patient does have history of hypertension.  Patient NAD on exam.  Differential diagnosis includes, but is not limited to, laceration, underlying fracture, tendon injury, nerve  injury, vascular injury, wound infection.  Patient's presentation is most consistent with acute complicated illness / injury requiring diagnostic workup.  X-ray of the left thumb does not show any underlying fracture.  Do not suspect tendon or vascular injury but patient may have nerve injury as he has decreased sensation to the tip of his thumb.  Laceration is over 12 hours old and is not appropriate for closure with sutures.  Did discuss further wound care with patient including keeping the wound bed moist which will be accomplished with nonadherent gauze and antibiotic ointment.  Will give patient prescription for antibiotics to prevent wound infection.  His tetanus shot was up-to-date.   Patient voiced understanding, questions were answered and he was stable at discharge.     FINAL CLINICAL IMPRESSION(S) / ED DIAGNOSES   Final diagnoses:  Laceration of left thumb with foreign body without damage to nail, initial encounter     Rx / DC Orders   ED Discharge Orders          Ordered    cephALEXin  (KEFLEX ) 500 MG capsule  4 times daily        01/11/25 0826    bacitracin  500 UNIT/GM ointment  2  times daily        01/11/25 9173             Note:  This document was prepared using Dragon voice recognition software and may include unintentional dictation errors.   Cleaster Tinnie LABOR, PA-C 01/11/25 0827    Floy Roberts, MD 01/11/25 [phone]  "

## 2025-01-11 NOTE — ED Notes (Signed)
 See triage note  Presents with laceration to left thumb  States he cut it on a table saw yesterday morning around a 10 am

## 2025-01-11 NOTE — Discharge Instructions (Addendum)
 The x-ray of your thumb does not show an underlying fracture.  There is a very small, 1 to 2 mm foreign body in the tip of your thumb.  Wash the wound with soap and water daily.  Please keep the wound bed moist for best wound healing.  This can be accomplished by using the antibiotic ointment that was sent to the pharmacy and nonadherent gauze.  Wound will need to be covered with a bandage until fully healed. Watch for signs of infection including redness, warmth, swelling, pain and pus drainage.  If you develop any of these please return to the ED, urgent care or your primary care provider.  I sent some antibiotics to the pharmacy to prevent wound infection.  Please make sure you take all the medication.

## 2025-01-11 NOTE — ED Triage Notes (Signed)
 Pt to ED for laceration to left thumb from table saw yesterday. Bleeding controlled. Thumb wrapped.

## 2025-01-21 ENCOUNTER — Other Ambulatory Visit: Payer: Self-pay

## 2025-01-21 ENCOUNTER — Ambulatory Visit: Payer: Self-pay

## 2025-01-21 ENCOUNTER — Emergency Department
Admission: EM | Admit: 2025-01-21 | Discharge: 2025-01-21 | Disposition: A | Discharge status: Home / Self Care | Attending: Emergency Medicine | Admitting: Emergency Medicine

## 2025-01-21 ENCOUNTER — Ambulatory Visit: Admitting: Nurse Practitioner

## 2025-01-21 DIAGNOSIS — Z4800 Encounter for change or removal of nonsurgical wound dressing: Secondary | ICD-10-CM | POA: Diagnosis present

## 2025-01-21 DIAGNOSIS — E039 Hypothyroidism, unspecified: Secondary | ICD-10-CM | POA: Insufficient documentation

## 2025-01-21 DIAGNOSIS — E119 Type 2 diabetes mellitus without complications: Secondary | ICD-10-CM | POA: Insufficient documentation

## 2025-01-21 DIAGNOSIS — S61012D Laceration without foreign body of left thumb without damage to nail, subsequent encounter: Secondary | ICD-10-CM | POA: Diagnosis not present

## 2025-01-21 DIAGNOSIS — X58XXXD Exposure to other specified factors, subsequent encounter: Secondary | ICD-10-CM | POA: Insufficient documentation

## 2025-01-21 DIAGNOSIS — I10 Essential (primary) hypertension: Secondary | ICD-10-CM | POA: Insufficient documentation

## 2025-01-21 DIAGNOSIS — Z5189 Encounter for other specified aftercare: Secondary | ICD-10-CM

## 2025-01-21 MED ORDER — DOXYCYCLINE HYCLATE 100 MG PO TABS: 100.0000 mg | ORAL_TABLET | Freq: Two times a day (BID) | ORAL | 0 refills | Status: AC

## 2025-01-21 NOTE — ED Provider Notes (Signed)
 "  [facility] Provider Note    Event Date/Time   First MD Initiated Contact with Patient 01/21/25 1924     (approximate)   History   Wound Check   HPI  [name] is a 54 y.o. male Struve hypertension, diabetes, hypothyroidism presents emergency department for a wound check.  Patient had a laceration to his thumb.  Was seen here in the ED.  Could not be sutured as it been too many hours since the injury.  Patient states basically there was nothing boldly other anyways.  Just wants to make sure it is not infected.  States he saw on his report where there was a foreign body and did not know why they did not take it out.  Denies fever, chills or drainage      Physical Exam   Triage Vital Signs: ED Triage Vitals  Encounter Vitals Group     BP 01/21/25 1900 (!) 140/98     Girls Systolic BP Percentile --      Girls Diastolic BP Percentile --      Boys Systolic BP Percentile --      Boys Diastolic BP Percentile --      Pulse Rate 01/21/25 1900 72     Resp 01/21/25 1900 16     Temp 01/21/25 1900 98.5 F (36.9 C)     Temp Source 01/21/25 1900 Oral     SpO2 01/21/25 1900 97 %     Weight 01/21/25 1902 248 lb (112.5 kg)     Height 01/21/25 1902 5' 9 (1.753 m)     Head Circumference --      Peak Flow --      Pain Score 01/21/25 1901 5     Pain Loc --      Pain Education --      Exclude from Growth Chart --     Most recent vital signs: Vitals:   01/21/25 1900  BP: (!) 140/98  Pulse: 72  Resp: 16  Temp: 98.5 F (36.9 C)  SpO2: 97%     General: Awake, no distress.   CV:  Good peripheral perfusion. Resp:  Normal effort Abd:  No distention.   Other:  Left thumb with healing wound, no redness pus or drainage noted, area is tender to palpation, neurovascular intact, no foreign body noted on the exterior skin   ED Results / Procedures / Treatments   Labs (all labs ordered are listed, but only abnormal results are displayed) Labs Reviewed  - No data to display   EKG     RADIOLOGY     PROCEDURES:   Procedures  Critical Care:  no Chief Complaint  Patient presents with   Wound Check      MEDICATIONS ORDERED IN ED: Medications - No data to display   IMPRESSION / MDM / ASSESSMENT AND PLAN / ED COURSE  I reviewed the triage vital signs and the nursing notes.                              Differential diagnosis includes, but is not limited to, cellulitis, wound infection, dehiscence, wound check, foreign body  Patient's presentation is most consistent with acute illness / injury with system symptoms.   I did review the past x-ray which showed a foreign body most likely on the skin versus very superficial within the thumb.   I did offer to rex-ray the thumb to  assess for foreign body.  Patient is deferring at this time since there is nothing to do for it.  Will follow-up with hand specialist if not improving in 1 week.  Explained to him may take a few months for the areas to completely heal and to think skin.  In case he gets an infection during the snowstorm prescription for doxycycline  sent to the pharmacy.  He is in agreement treatment plan.  Discharged stable condition.      FINAL CLINICAL IMPRESSION(S) / ED DIAGNOSES   Final diagnoses:  Visit for wound check     Rx / DC Orders   ED Discharge Orders          Ordered    doxycycline  (VIBRA -TABS) 100 MG tablet  2 times daily        01/21/25 1932             Note:  This document was prepared using Dragon voice recognition software and may include unintentional dictation errors.    [name] ORN, PA-C 01/21/25 KENITH Arlander [place], MD 01/21/25 2017  "

## 2025-01-21 NOTE — Discharge Instructions (Signed)
 Followed up with Dr. Ezra who is a hand specialist if he continue to have problems with your thumb.  If he is seeing any redness or pus or more swelling, increased pain take the doxycycline  and follow-up with Dr. Ezra

## 2025-01-21 NOTE — Telephone Encounter (Signed)
" ° °  FYI Only or Action Required?: FYI only for provider: appointment scheduled on 01.30.26.  Patient was last seen in primary care on 08/30/2024 by Glenard Mire, MD.  Called Nurse Triage reporting Laceration.  Symptoms began a week ago.  Interventions attempted: OTC medications: Tylenol .  Symptoms are: gradually worsening.  Triage Disposition: See PCP When Office is Open (Within 3 Days)  Patient/caregiver understands and will follow disposition?: Yes   Message from [name] sent at 01/21/2025 11:30 AM EST  Reason for Triage: Patient cut his finger, pain worsening Wife [name] on the line   Reason for Disposition  [1] After 1 week AND [2] not using the finger normally  Answer Assessment - Initial Assessment Questions Pt not available at time of triage. Symptoms listed are per pt's wife  1. MECHANISM: How did the injury happen?       Table saw  2. ONSET: When did the injury happen? (e.g., minutes, hours ago)       X 1 week  3. LOCATION: What part of the finger is injured? Is the nail damaged?       Right hand/second finger  4. APPEARANCE of the INJURY: What does the injury look like?       Swollen, red  5. SEVERITY: Can you use the hand normally?  Can you bend your fingers into a ball and then fully open them?      Unable to use finger     Pt's wife reports finger injury x 1 week. Finger red and swollen. Pt concerned with infection Pt is taking OTC Tylenol  Pt scheduled for a visit on 01.30.26  for further evaluation. Soonest appt for recent ER visit not within time frame  Pt agrees with plan of care, will call back for any worsening symptoms  Protocols used: Finger Injury-A-AH  "

## 2025-01-21 NOTE — ED Triage Notes (Signed)
 Pt arrives via POV to have a wound check. Pt was seen here on 1/20 for a laceration to their left thumb. Pt states that it's still painful and they want to make sure there is no infection. Pt is A&Ox4 and ambulatory during triage.

## 2025-01-24 ENCOUNTER — Ambulatory Visit: Admitting: Family Medicine

## 2025-08-04 ENCOUNTER — Ambulatory Visit
